# Patient Record
Sex: Male | Born: 1950 | Race: White | Hispanic: No | Marital: Married | Smoking: Former smoker
Health system: Southern US, Community
[De-identification: ages and names within clinical notes are randomized; demographics above are authoritative.]

## PROBLEM LIST (undated history)

## (undated) DIAGNOSIS — N210 Calculus in bladder: Secondary | ICD-10-CM

## (undated) DIAGNOSIS — N200 Calculus of kidney: Secondary | ICD-10-CM

## (undated) DIAGNOSIS — Z8719 Personal history of other diseases of the digestive system: Secondary | ICD-10-CM

## (undated) DIAGNOSIS — G40209 Localization-related (focal) (partial) symptomatic epilepsy and epileptic syndromes with complex partial seizures, not intractable, without status epilepticus: Secondary | ICD-10-CM

## (undated) DIAGNOSIS — Z973 Presence of spectacles and contact lenses: Secondary | ICD-10-CM

## (undated) DIAGNOSIS — R339 Retention of urine, unspecified: Secondary | ICD-10-CM

## (undated) DIAGNOSIS — Z87898 Personal history of other specified conditions: Secondary | ICD-10-CM

## (undated) DIAGNOSIS — R569 Unspecified convulsions: Secondary | ICD-10-CM

## (undated) DIAGNOSIS — N281 Cyst of kidney, acquired: Secondary | ICD-10-CM

## (undated) DIAGNOSIS — N401 Enlarged prostate with lower urinary tract symptoms: Secondary | ICD-10-CM

## (undated) DIAGNOSIS — R1084 Generalized abdominal pain: Secondary | ICD-10-CM

## (undated) DIAGNOSIS — G4700 Insomnia, unspecified: Secondary | ICD-10-CM

## (undated) DIAGNOSIS — K219 Gastro-esophageal reflux disease without esophagitis: Secondary | ICD-10-CM

## (undated) DIAGNOSIS — N138 Other obstructive and reflux uropathy: Secondary | ICD-10-CM

## (undated) DIAGNOSIS — Z85828 Personal history of other malignant neoplasm of skin: Secondary | ICD-10-CM

## (undated) DIAGNOSIS — C61 Malignant neoplasm of prostate: Secondary | ICD-10-CM

## (undated) DIAGNOSIS — M1990 Unspecified osteoarthritis, unspecified site: Secondary | ICD-10-CM

## (undated) DIAGNOSIS — E785 Hyperlipidemia, unspecified: Secondary | ICD-10-CM

## (undated) DIAGNOSIS — K449 Diaphragmatic hernia without obstruction or gangrene: Secondary | ICD-10-CM

## (undated) DIAGNOSIS — R9431 Abnormal electrocardiogram [ECG] [EKG]: Secondary | ICD-10-CM

## (undated) DIAGNOSIS — Z8669 Personal history of other diseases of the nervous system and sense organs: Secondary | ICD-10-CM

## (undated) DIAGNOSIS — G250 Essential tremor: Secondary | ICD-10-CM

## (undated) DIAGNOSIS — Z9889 Other specified postprocedural states: Secondary | ICD-10-CM

## (undated) DIAGNOSIS — G43909 Migraine, unspecified, not intractable, without status migrainosus: Secondary | ICD-10-CM

## (undated) DIAGNOSIS — R0789 Other chest pain: Secondary | ICD-10-CM

## (undated) DIAGNOSIS — K5190 Ulcerative colitis, unspecified, without complications: Secondary | ICD-10-CM

## (undated) DIAGNOSIS — Z87448 Personal history of other diseases of urinary system: Secondary | ICD-10-CM

## (undated) DIAGNOSIS — K5900 Constipation, unspecified: Secondary | ICD-10-CM

## (undated) DIAGNOSIS — I10 Essential (primary) hypertension: Secondary | ICD-10-CM

## (undated) DIAGNOSIS — D329 Benign neoplasm of meninges, unspecified: Secondary | ICD-10-CM

## (undated) DIAGNOSIS — G43109 Migraine with aura, not intractable, without status migrainosus: Secondary | ICD-10-CM

## (undated) DIAGNOSIS — Z87442 Personal history of urinary calculi: Secondary | ICD-10-CM

## (undated) DIAGNOSIS — I209 Angina pectoris, unspecified: Secondary | ICD-10-CM

## (undated) HISTORY — PX: CARPAL TUNNEL RELEASE: SHX101

## (undated) HISTORY — PX: DIAGNOSTIC LAPAROSCOPY: SUR761

## (undated) HISTORY — PX: TOE SURGERY: SHX1073

## (undated) HISTORY — PX: INSERTION PROSTATE RADIATION SEED: SUR718

## (undated) HISTORY — PX: BILATERAL CARPAL TUNNEL RELEASE: SHX6508

## (undated) HISTORY — PX: CRANIOTOMY: SHX93

## (undated) HISTORY — PX: PROSTATE BIOPSY: SHX241

## (undated) HISTORY — PX: KNEE CARTILAGE SURGERY: SHX688

---
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
## 2008-10-08 DIAGNOSIS — C61 Malignant neoplasm of prostate: Secondary | ICD-10-CM

## 2008-10-08 DIAGNOSIS — D329 Benign neoplasm of meninges, unspecified: Secondary | ICD-10-CM

## 2008-10-08 DIAGNOSIS — Z8546 Personal history of malignant neoplasm of prostate: Secondary | ICD-10-CM

## 2008-10-08 HISTORY — DX: Personal history of malignant neoplasm of prostate: Z85.46

## 2008-10-08 HISTORY — DX: Benign neoplasm of meninges, unspecified: D32.9

## 2008-10-08 HISTORY — PX: CRANIOTOMY: SHX93

## 2008-10-08 HISTORY — PX: INSERTION PROSTATE RADIATION SEED: SUR718

## 2008-10-08 HISTORY — DX: Malignant neoplasm of prostate: C61

## 2008-11-16 ENCOUNTER — Ambulatory Visit: Admission: RE | Admit: 2008-11-16 | Discharge: 2009-01-31 | Payer: Self-pay | Admitting: Radiation Oncology

## 2009-02-01 ENCOUNTER — Ambulatory Visit: Admission: RE | Admit: 2009-02-01 | Discharge: 2009-05-02 | Payer: Self-pay | Admitting: Radiation Oncology

## 2009-02-11 DIAGNOSIS — Z8603 Personal history of neoplasm of uncertain behavior: Secondary | ICD-10-CM

## 2009-02-11 HISTORY — PX: BRAIN MENINGIOMA EXCISION: SHX576

## 2009-02-11 HISTORY — DX: Personal history of neoplasm of uncertain behavior: Z86.03

## 2009-02-17 ENCOUNTER — Encounter: Admission: RE | Admit: 2009-02-17 | Discharge: 2009-02-17 | Payer: Self-pay | Admitting: Urology

## 2009-03-04 ENCOUNTER — Ambulatory Visit (HOSPITAL_BASED_OUTPATIENT_CLINIC_OR_DEPARTMENT_OTHER): Admission: RE | Admit: 2009-03-04 | Discharge: 2009-03-04 | Payer: Self-pay | Admitting: Urology

## 2009-03-04 HISTORY — PX: INSERTION PROSTATE RADIATION SEED: SUR718

## 2009-04-29 ENCOUNTER — Emergency Department (HOSPITAL_BASED_OUTPATIENT_CLINIC_OR_DEPARTMENT_OTHER): Admission: EM | Admit: 2009-04-29 | Discharge: 2009-04-30 | Payer: Self-pay | Admitting: Emergency Medicine

## 2009-04-30 ENCOUNTER — Ambulatory Visit: Payer: Self-pay | Admitting: Diagnostic Radiology

## 2009-07-08 ENCOUNTER — Ambulatory Visit (HOSPITAL_BASED_OUTPATIENT_CLINIC_OR_DEPARTMENT_OTHER): Admission: RE | Admit: 2009-07-08 | Discharge: 2009-07-08 | Payer: Self-pay | Admitting: Urology

## 2009-07-08 HISTORY — PX: CYSTOSCOPY WITH URETHRAL DILATATION: SHX5125

## 2011-01-11 LAB — POCT HEMOGLOBIN-HEMACUE: Hemoglobin: 13.3 g/dL (ref 13.0–17.0)

## 2011-01-14 LAB — COMPREHENSIVE METABOLIC PANEL
ALT: 20 U/L (ref 0–53)
Alkaline Phosphatase: 63 U/L (ref 39–117)
Calcium: 9 mg/dL (ref 8.4–10.5)
Creatinine, Ser: 2 mg/dL — ABNORMAL HIGH (ref 0.4–1.5)
GFR calc Af Amer: 42 mL/min — ABNORMAL LOW (ref >60)
Glucose, Bld: 107 mg/dL — ABNORMAL HIGH (ref 70–99)
Sodium: 140 mEq/L (ref 135–145)
Total Bilirubin: 0.5 mg/dL (ref 0.3–1.2)
Total Protein: 6.7 g/dL (ref 6.0–8.3)

## 2011-01-14 LAB — CBC
HCT: 34.6 % — ABNORMAL LOW (ref 39.0–52.0)
Platelets: 188 10*3/uL (ref 150–400)

## 2011-01-14 LAB — URINE MICROSCOPIC-ADD ON

## 2011-01-14 LAB — DIFFERENTIAL
Basophils Absolute: 0.2 10*3/uL — ABNORMAL HIGH (ref 0.0–0.1)
Basophils Relative: 1 % (ref 0–1)
Eosinophils Relative: 1 % (ref 0–5)
Lymphocytes Relative: 6 % — ABNORMAL LOW (ref 12–46)

## 2011-01-14 LAB — URINALYSIS, ROUTINE W REFLEX MICROSCOPIC
Glucose, UA: NEGATIVE mg/dL
Nitrite: POSITIVE — AB
Specific Gravity, Urine: 1.018 (ref 1.005–1.030)

## 2011-01-14 LAB — URINE CULTURE

## 2011-01-16 LAB — COMPREHENSIVE METABOLIC PANEL
ALT: 18 U/L (ref 0–53)
AST: 20 U/L (ref 0–37)
Albumin: 4.3 g/dL (ref 3.5–5.2)
Alkaline Phosphatase: 51 U/L (ref 39–117)
BUN: 22 mg/dL (ref 6–23)
CO2: 28 mEq/L (ref 19–32)
Calcium: 9.5 mg/dL (ref 8.4–10.5)
Chloride: 108 mEq/L (ref 96–112)
Creatinine, Ser: 1.08 mg/dL (ref 0.4–1.5)
GFR calc Af Amer: >60 mL/min (ref >60)
GFR calc non Af Amer: >60 mL/min (ref >60)
Glucose, Bld: 122 mg/dL — ABNORMAL HIGH (ref 70–99)
Potassium: 4 mEq/L (ref 3.5–5.1)
Sodium: 143 mEq/L (ref 135–145)
Total Bilirubin: 0.9 mg/dL (ref 0.3–1.2)
Total Protein: 6.9 g/dL (ref 6.0–8.3)

## 2011-01-16 LAB — CBC
HCT: 43 % (ref 39.0–52.0)
Hemoglobin: 14.4 g/dL (ref 13.0–17.0)
MCHC: 33.4 g/dL (ref 30.0–36.0)
MCV: 93.2 fL (ref 78.0–100.0)
Platelets: 187 10*3/uL (ref 150–400)
RBC: 4.62 MIL/uL (ref 4.22–5.81)
RDW: 13.7 % (ref 11.5–15.5)
WBC: 6.5 10*3/uL (ref 4.0–10.5)

## 2011-01-16 LAB — PROTIME-INR
INR: 1 (ref 0.00–1.49)
Prothrombin Time: 13.6 seconds (ref 11.6–15.2)

## 2011-01-16 LAB — APTT: aPTT: 28 seconds (ref 24–37)

## 2011-02-20 NOTE — Op Note (Signed)
NAME:  Wesley [name], Wesley [name] NO.:  [id number]   MEDICAL RECORD NO.:  [id number]          PATIENT TYPE:  REC   LOCATION:  [facility]                         FACILITY:  [facility]   PHYSICIAN:  Mark C. Vernie Ammons, M.D.  DATE OF BIRTH:  1950/11/03   DATE OF PROCEDURE:  03/04/2009  DATE OF DISCHARGE:  02/11/2009                               OPERATIVE REPORT   PREOPERATIVE DIAGNOSIS:  Adenocarcinoma of the prostate.   POSTOPERATIVE DIAGNOSIS:  Adenocarcinoma of the prostate.   PROCEDURE:  I-125 radioactive seed implantation.   SURGEON:  Dr. Vernie Ammons.   RADIATION ONCOLOGIST:  Dr. Kathrynn Running.   ANESTHESIA:  General.   SPECIMENS:  None.   DRAINS:  A 16-French Foley catheter.   BLOOD LOSS:  Minimal.   COMPLICATIONS:  None.   INDICATIONS:  The patient is a 60 year old male who was initially seen  with a PSA of 5.2.  This prompted prostate biopsy which revealed Gleason  4+3 adenocarcinoma in 5 of 12 cores.  He presents today for radioactive  seed implantation.  I have discussed the risks, complications,  alternatives and limitations with him.  He understands and has elected  to proceed.   DESCRIPTION OF OPERATION:  After informed consent, the patient was  brought to the major OR, placed on table and administered general  anesthesia.  He was then moved to the modified lithotomy position and  his perineum and genitalia were sterilely prepped and draped.  A 16-  French Foley catheter was placed in the bladder with dilute contrast  filling the balloon and the rectal probe, as well a rectal tube were  placed.  The probe was affixed to the stand and real-time planning  ultrasound was then performed by Dr. Kathrynn Running.   Using the real-time plan developed intraoperatively, a total of 24  needles were used to place a total of 82 seeds within the prostate  according to the plan using the Nucletron trauma and direct real-time  ultrasonography.  This proceeded without difficulty.   The Foley  catheter was then removed and flexible cystoscopy was  performed.  The urethra was noted be normal and the sphincter intact.  The prostatic urethra revealed bilobar hypertrophy, but there were no  intraprostatic lesions or seeds identified.  Upon entering the bladder,  I noted 1+ trabeculation.  The bladder was systematically inspected and  noted to be free of any tumor, stones or inflammatory lesions.  The  ureteral orifices were of normal configuration and position.  Retroflexion of the scope revealed no seeds on the floor of the bladder  or protruding from the prostate into the base of the bladder.  I  therefore removed the cystoscope, placed a new Foley catheter and  connected this to close system drainage and the patient was awakened and  taken to recovery room in stable and satisfactory condition.  He  tolerated the procedure well and there no intraoperative complications.   He will be given prescriptions for Vicodin HP #28 and Cipro 500 mg #10.  He will follow-up in my office and with Dr. Kathrynn Running in 3 weeks and  remove his Foley  catheter at home in the morning.      Mark C. Vernie Ammons, M.D.  Electronically Signed     MCO/MEDQ  D:  03/04/2009  T:  03/04/2009  Job:  [id number]   cc:   Artist Pais Kathrynn Running, M.D.  Fax: [phone]

## 2012-03-01 ENCOUNTER — Emergency Department (HOSPITAL_BASED_OUTPATIENT_CLINIC_OR_DEPARTMENT_OTHER)
Admission: EM | Admit: 2012-03-01 | Discharge: 2012-03-01 | Disposition: A | Discharge status: Home / Self Care | Payer: PRIVATE HEALTH INSURANCE | Source: Home / Self Care | Attending: Emergency Medicine | Admitting: Emergency Medicine

## 2012-03-01 ENCOUNTER — Encounter (HOSPITAL_COMMUNITY): Payer: Self-pay | Admitting: *Deleted

## 2012-03-01 ENCOUNTER — Inpatient Hospital Stay (HOSPITAL_COMMUNITY)
Admission: EM | Admit: 2012-03-01 | Discharge: 2012-03-06 | DRG: 663 | Disposition: A | Discharge status: Home / Self Care | Payer: PRIVATE HEALTH INSURANCE | Attending: Urology | Admitting: Urology

## 2012-03-01 ENCOUNTER — Encounter (HOSPITAL_BASED_OUTPATIENT_CLINIC_OR_DEPARTMENT_OTHER): Payer: Self-pay | Admitting: *Deleted

## 2012-03-01 DIAGNOSIS — R319 Hematuria, unspecified: Secondary | ICD-10-CM | POA: Diagnosis present

## 2012-03-01 DIAGNOSIS — Z87442 Personal history of urinary calculi: Secondary | ICD-10-CM

## 2012-03-01 DIAGNOSIS — Y846 Urinary catheterization as the cause of abnormal reaction of the patient, or of later complication, without mention of misadventure at the time of the procedure: Secondary | ICD-10-CM | POA: Insufficient documentation

## 2012-03-01 DIAGNOSIS — N3040 Irradiation cystitis without hematuria: Principal | ICD-10-CM | POA: Diagnosis present

## 2012-03-01 DIAGNOSIS — Y842 Radiological procedure and radiotherapy as the cause of abnormal reaction of the patient, or of later complication, without mention of misadventure at the time of the procedure: Secondary | ICD-10-CM | POA: Diagnosis present

## 2012-03-01 DIAGNOSIS — R310 Gross hematuria: Secondary | ICD-10-CM | POA: Diagnosis present

## 2012-03-01 DIAGNOSIS — N3289 Other specified disorders of bladder: Secondary | ICD-10-CM | POA: Diagnosis present

## 2012-03-01 DIAGNOSIS — Z923 Personal history of irradiation: Secondary | ICD-10-CM

## 2012-03-01 DIAGNOSIS — Z9889 Other specified postprocedural states: Secondary | ICD-10-CM | POA: Insufficient documentation

## 2012-03-01 DIAGNOSIS — R300 Dysuria: Secondary | ICD-10-CM | POA: Insufficient documentation

## 2012-03-01 DIAGNOSIS — Z8546 Personal history of malignant neoplasm of prostate: Secondary | ICD-10-CM

## 2012-03-01 DIAGNOSIS — Z79899 Other long term (current) drug therapy: Secondary | ICD-10-CM | POA: Insufficient documentation

## 2012-03-01 DIAGNOSIS — T839XXA Unspecified complication of genitourinary prosthetic device, implant and graft, initial encounter: Secondary | ICD-10-CM

## 2012-03-01 DIAGNOSIS — R339 Retention of urine, unspecified: Secondary | ICD-10-CM

## 2012-03-01 DIAGNOSIS — R338 Other retention of urine: Secondary | ICD-10-CM | POA: Diagnosis present

## 2012-03-01 DIAGNOSIS — T66XXXS Radiation sickness, unspecified, sequela: Secondary | ICD-10-CM

## 2012-03-01 DIAGNOSIS — T83091A Other mechanical complication of indwelling urethral catheter, initial encounter: Secondary | ICD-10-CM | POA: Insufficient documentation

## 2012-03-01 HISTORY — DX: Benign neoplasm of meninges, unspecified: D32.9

## 2012-03-01 HISTORY — DX: Calculus of kidney: N20.0

## 2012-03-01 HISTORY — DX: Other specified postprocedural states: Z98.890

## 2012-03-01 HISTORY — DX: Malignant neoplasm of prostate: C61

## 2012-03-01 LAB — URINE MICROSCOPIC-ADD ON

## 2012-03-01 LAB — URINALYSIS, ROUTINE W REFLEX MICROSCOPIC
Glucose, UA: NEGATIVE mg/dL
Specific Gravity, Urine: 1.024 (ref 1.005–1.030)
pH: 6.5 (ref 5.0–8.0)

## 2012-03-01 LAB — HEMOGLOBIN AND HEMATOCRIT, BLOOD: Hemoglobin: 11.7 g/dL — ABNORMAL LOW (ref 13.0–17.0)

## 2012-03-01 MED ORDER — LIDOCAINE HCL 2 % EX GEL
CUTANEOUS | Status: AC
  Administered 2012-03-01: 16:00:00
  Filled 2012-03-01: qty 10

## 2012-03-01 MED ORDER — DEXTROSE 5 % IV SOLN
1.0000 g | Freq: Once | INTRAVENOUS | Status: AC
  Administered 2012-03-01: 23:00:00 via INTRAVENOUS
  Filled 2012-03-01: qty 10

## 2012-03-01 MED ORDER — DEXTROSE-NACL 5-0.45 % IV SOLN
INTRAVENOUS | Status: DC
  Administered 2012-03-01: 20 mL/h via INTRAVENOUS

## 2012-03-01 MED ORDER — HYDROMORPHONE HCL PF 1 MG/ML IJ SOLN: 1.0000 mg | INTRAMUSCULAR | Status: DC | PRN

## 2012-03-01 NOTE — H&P (Signed)
Wesley [name] is an 61 y.o. male.   Chief Complaint: clot retention HPI: Mr. Balaban is a patient of Dr. Vernie Ammons with a history of prostate cancer treated with a seed implant in 2010.  Last week he began to have gross hematuria and was seen at the [facility] in [place].   He has a history of stones and CT was done and he reports it as unremarkable.   He had a foley placed but developed clot obstruction of the catheter.  He came to the office earlier this week and was irrigated and sent out with a 90fr 3 way foley.  Today the foley stopped draining and he came to the ER at my request.  He has abdominal pain from the retention.   He had a recent negative culture.   In the ER I irrigated his catheter with 1.5 liters of saline and removed a large amount of clot but was eventually able to get him clear.   He was placed on continuous irrigation with clear drainage.  Past Medical History  Diagnosis Date  . Prostate ca   . Renal calculi   . H/O craniotomy   . Meningioma     Past Surgical History  Procedure Date  . Craniotomy   . Insertion prostate radiation seed   . Knee cartilage surgery     History reviewed. No pertinent family history. Social History:  reports that he quit smoking about 25 years ago. His smoking use included Cigarettes. He smoked 1 pack per day. He has never used smokeless tobacco. He reports that he drinks about 1.2 ounces of alcohol per week. He reports that he does not use illicit drugs.  Allergies:  Allergies  Allergen Reactions  . Sulfa Antibiotics Rash    I have reviewed his medications and he is on tamsulosin, avodart, finasteride, crestor and asacol.  Results for orders placed during the hospital encounter of 03/01/12 (from the past 48 hour(s))  URINALYSIS, ROUTINE W REFLEX MICROSCOPIC     Status: Abnormal   Collection Time   03/01/12  4:29 PM      Component Value Range Comment   Color, Urine RED (*) YELLOW  BIOCHEMICALS MAY BE AFFECTED BY COLOR     APPearance TURBID (*) CLEAR     Specific Gravity, Urine 1.024  1.005 - 1.030     pH 6.5  5.0 - 8.0     Glucose, UA NEGATIVE  NEGATIVE (mg/dL)    Hgb urine dipstick LARGE (*) NEGATIVE     Bilirubin Urine MODERATE (*) NEGATIVE     Ketones, ur 15 (*) NEGATIVE (mg/dL)    Protein, ur >161 (*) NEGATIVE (mg/dL)    Urobilinogen, UA 1.0  0.0 - 1.0 (mg/dL)    Nitrite POSITIVE (*) NEGATIVE     Leukocytes, UA MODERATE (*) NEGATIVE    URINE MICROSCOPIC-ADD ON     Status: Abnormal   Collection Time   03/01/12  4:29 PM      Component Value Range Comment   WBC, UA 3-6  <3 (WBC/hpf)    RBC / HPF TOO NUMEROUS TO COUNT  <3 (RBC/hpf)    Bacteria, UA FEW (*) RARE     Urine-Other FIELD OBSCURED BY RBC'S   URINALYSIS PERFORMED ON SUPERNATANT  HEMOGLOBIN AND HEMATOCRIT, BLOOD     Status: Abnormal   Collection Time   03/01/12  6:09 PM      Component Value Range Comment   Hemoglobin 11.7 (*) 13.0 - 17.0 (g/dL)  HCT 34.4 (*) 39.0 - 52.0 (%)    No results found.  Review of Systems  Constitutional: Negative for fever and chills.  Respiratory: Negative for shortness of breath.   Cardiovascular: Negative for chest pain.  Gastrointestinal: Positive for abdominal pain (suprapubic with the obstructed foley.).  Genitourinary: Positive for hematuria.  All other systems reviewed and are negative.    Blood pressure 156/75, pulse 84, temperature 99 F (37.2 C), temperature source Oral, resp. rate 16, height 5\' 9"  (1.753 m), weight 76.658 kg (169 lb), SpO2 98.00%. Physical Exam  Constitutional: He is oriented to person, place, and time. He appears well-developed and well-nourished.  HENT:  Head: Normocephalic and atraumatic.  Neck: Normal range of motion. Neck supple.  Cardiovascular: Normal rate and regular rhythm.   Respiratory: Effort normal and breath sounds normal.  GI: Soft. Bowel sounds are normal. There is tenderness (suprapubic with fullness.).  Genitourinary: Penis normal.       3- way foley  indwelling that will not drain with irrigation.  Musculoskeletal: Normal range of motion. He exhibits no edema.  Neurological: He is alert and oriented to person, place, and time.  Skin: Skin is warm. He is diaphoretic (with the obstructed foley.).  Psychiatric: He has a normal mood and affect. His behavior is normal.     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  I am going to keep him NPO overnight in case the bleeding recurs and cystoscopy is required. If the urine remains clear, the foley can be removed in the morning. He will need f/u with Dr. Vernie Ammons for cystoscopy as an outpatient if he doesn't require cystoscopy here. He received a gram of Rocephin in the ER.  Kahealani Yankovich J 03/01/2012, 10:00 PM

## 2012-03-01 NOTE — ED Provider Notes (Signed)
History     CSN: [id number]  Arrival date & time 03/01/12  1432   First MD Initiated Contact with Patient 03/01/12 1535      Chief Complaint  Patient presents with  . Hematuria  . foley catheter obstruction     (Consider location/radiation/quality/duration/timing/severity/associated sxs/prior treatment) Patient is a 61 y.o. male presenting with hematuria. The history is provided by the patient.  Hematuria This is a recurrent problem. The current episode started today. Pertinent negatives include no fever or nausea. (Foley catheter in place for urinary retention x 2 weeks with gross hematuria. He has followed up with his urologist and foley changed in office. Today he has a recurrence of failure of urine to drain into bag and abdominal distention c/w retention. ) (He has a history of prostate CA with radioactive seeding that has bled from time to time. The current episode is the longest period of bleeding at 2 weeks.)    Past Medical History  Diagnosis Date  . Prostate ca   . Renal calculi   . H/O craniotomy   . Meningioma     Past Surgical History  Procedure Date  . Craniotomy   . Insertion prostate radiation seed   . Knee cartilage surgery     History reviewed. No pertinent family history.  History  Substance Use Topics  . Smoking status: Former Smoker -- 1.0 packs/day    Types: Cigarettes    Quit date: 03/02/1987  . Smokeless tobacco: Never Used  . Alcohol Use: 1.2 oz/week    2 Glasses of wine per week     once a week      Review of Systems  Constitutional: Negative for fever.  Gastrointestinal: Positive for abdominal distention. Negative for nausea.  Genitourinary: Positive for hematuria.       See HPI.    Allergies  Sulfa antibiotics  Home Medications   Current Outpatient Rx  Name Route Sig Dispense Refill  . ATORVASTATIN CALCIUM 20 MG PO TABS Oral Take 20 mg by mouth daily.    . DUTASTERIDE 0.5 MG PO CAPS Oral Take 0.5 mg by mouth daily.    Marland Kitchen  FINASTERIDE 5 MG PO TABS Oral Take 5 mg by mouth daily.    Marland Kitchen MESALAMINE 400 MG PO TBEC Oral Take 2,400 mg by mouth 2 (two) times daily.     Marland Kitchen TAMSULOSIN HCL 0.4 MG PO CAPS Oral Take 0.4 mg by mouth daily.      BP 150/80  Pulse 82  Temp(Src) 98.8 F (37.1 C) (Oral)  Resp 18  Ht 5\' 9"  (1.753 m)  Wt 169 lb (76.658 kg)  BMI 24.96 kg/m2  SpO2 99%  Physical Exam  Constitutional: He appears well-developed and well-nourished.       Uncomfortable in appearance.  Pulmonary/Chest: Effort normal.  Abdominal: Soft. He exhibits distension. There is tenderness.  Genitourinary:       Foley catheter in place. Meatus unremarkable. Blood and clots in urinary bag without active drainage. No testicular tenderness or swelling.     ED Course  Procedures (including critical care time)  Labs Reviewed  URINALYSIS, ROUTINE W REFLEX MICROSCOPIC - Abnormal; Notable for the following:    Color, Urine RED (*) BIOCHEMICALS MAY BE AFFECTED BY COLOR   APPearance TURBID (*)    Hgb urine dipstick LARGE (*)    Bilirubin Urine MODERATE (*)    Ketones, ur 15 (*)    Protein, ur >300 (*)    Nitrite POSITIVE (*)  Leukocytes, UA MODERATE (*)    All other components within normal limits  URINE MICROSCOPIC-ADD ON - Abnormal; Notable for the following:    Bacteria, UA FEW (*)    All other components within normal limits  HEMOGLOBIN AND HEMATOCRIT, BLOOD   No results found.   No diagnosis found.  1. Hematuria 2. Urinary retention   MDM  Catheter has been irrigated and is draining grossly bloody urine into urinary bag. Patient is more comfortable.   Discussed with Dr. Annabell Howells who gives orders for admission. Patient brought up to date and agrees with care plan.       Rodena Medin, PA-C 03/01/12 1933

## 2012-03-01 NOTE — ED Notes (Signed)
Pt has an indwelling catheter for radiation seed implantation for prostat cancer. States that he has had blood in his urine and has had 2 foley catheters since last Sunday. States the catheters become clogged with blood. Pt went to his urologist, dr. Vernie Ammons and was told to come to the ER and have catheter irrigated. Pt has lower abdominal pain 7/10 at this time. Foley catheter has old blood in tubing.

## 2012-03-01 NOTE — ED Notes (Signed)
Report received from Bridgette RN  Pt is from home he was seen at [facility]  Earlier this am for urinary retention and passing only blood clots,  Catheter was inserted and flushed, pt was sent home,  This afternoon pt began having same symptoms again and he came to [facility] long ER and three way foley placed,  Approximately 600 cc bloody urine returned,  Then pt began feeling full in bladder area again, Bridgette RN stopped infusing bladder flush and notified EDP and urology was called,  Pt now awaiting admission to floor.  NAD,   Pt is currently reading his book

## 2012-03-01 NOTE — ED Notes (Signed)
Pt states he was seen at [facility] this am for same, catheter was irrigated until it was flowing freely. Pt sent home with instructions to increase fluid intake.  Pt states he hasn't put out much urine this afternoon and is beginning to feel pressure in his lower abd so he feels catheter is obstructed again.

## 2012-03-01 NOTE — ED Notes (Signed)
Evangeline Gula to return call for report  [phone] [phone]

## 2012-03-01 NOTE — ED Notes (Addendum)
3-way catheter inserted by Vincenza Hews RN for irrigation.  Catheter now draining clear, red urine.  No clots noted at this time. Pt denies need for pain medication.

## 2012-03-01 NOTE — ED Provider Notes (Signed)
History     CSN: [id number]  Arrival date & time 03/01/12  8295   First MD Initiated Contact with Patient 03/01/12 [phone]      Chief Complaint  Patient presents with  . Hematuria    clogged foley catheter    (Consider location/radiation/quality/duration/timing/severity/associated sxs/prior treatment) HPI Comments: Patient comes in complaining of a clogged Foley catheter. He states he had radiation seeds implanted due to prostate cancer, and occasionally he gets bleeding from his prostate. He started bleeding about 2 weeks ago and went to [facility] last Sunday due to difficulty urinating. They placed a Foley catheter and got a large amount of bloody urine out. He was doing fine until Thursday when the catheter got clogged and he went to his urologist at [facility] urology where they replaced the catheter with a larger catheter. He now has a 20-gauge catheter in place. He's continuing to have some blood clots and feels that the catheter has become clotted again as it is not draining urine  properly. He also notes some fullness in his bladder area. He denies any abdominal pain. Denies any nausea, vomiting. Denies any fevers or chills. Denies taking any blood thinners.  Patient is a 61 y.o. male presenting with hematuria. The history is provided by the patient.  Hematuria This is a recurrent problem. The problem has been gradually worsening since onset. He describes the hematuria as gross hematuria. Irritative symptoms do not include frequency. Associated symptoms include dysuria. Pertinent negatives include no abdominal pain, chills, fever, flank pain, nausea or vomiting.    Past Medical History  Diagnosis Date  . Prostate ca   . Renal calculi   . H/O craniotomy   . Meningioma     Past Surgical History  Procedure Date  . Craniotomy   . Insertion prostate radiation seed   . Knee cartilage surgery     History reviewed. No pertinent family history.  History  Substance  Use Topics  . Smoking status: Former Smoker -- 1.0 packs/day    Types: Cigarettes    Quit date: 03/02/1987  . Smokeless tobacco: Never Used  . Alcohol Use: Not on file      Review of Systems  Constitutional: Negative for fever, chills, diaphoresis and fatigue.  HENT: Negative for congestion, rhinorrhea and sneezing.   Eyes: Negative.   Respiratory: Negative for cough, chest tightness and shortness of breath.   Cardiovascular: Negative for chest pain and leg swelling.  Gastrointestinal: Negative for nausea, vomiting, abdominal pain, diarrhea and blood in stool.  Genitourinary: Positive for dysuria and hematuria. Negative for frequency, flank pain and difficulty urinating.  Musculoskeletal: Negative for back pain and arthralgias.  Skin: Negative for rash.  Neurological: Negative for dizziness, speech difficulty, weakness, numbness and headaches.    Allergies  Sulfa antibiotics  Home Medications   Current Outpatient Rx  Name Route Sig Dispense Refill  . ATORVASTATIN CALCIUM 20 MG PO TABS Oral Take 20 mg by mouth daily.    . DUTASTERIDE 0.5 MG PO CAPS Oral Take 0.5 mg by mouth daily.    Marland Kitchen MESALAMINE 400 MG PO TBEC Oral Take 2,400 mg by mouth 3 (three) times daily.    Marland Kitchen TAMSULOSIN HCL 0.4 MG PO CAPS Oral Take 0.4 mg by mouth daily.      BP 166/105  Pulse 110  Temp(Src) 98.9 F (37.2 C) (Oral)  Resp 18  Ht 5\' 9"  (1.753 m)  Wt 170 lb (77.111 kg)  BMI 25.10 kg/m2  SpO2 98%  Physical Exam  Constitutional: He is oriented to person, place, and time. He appears well-developed and well-nourished.  HENT:  Head: Normocephalic and atraumatic.  Eyes: Pupils are equal, round, and reactive to light.  Neck: Normal range of motion. Neck supple.  Cardiovascular: Normal rate, regular rhythm and normal heart sounds.   Pulmonary/Chest: Effort normal and breath sounds normal. No respiratory distress. He has no wheezes. He has no rales. He exhibits no tenderness.  Abdominal: Soft. Bowel  sounds are normal. There is no tenderness. There is no rebound and no guarding.  Genitourinary:       He has a Foley catheter in place, with a leg bag with a small amount of bloody urine in the bag  Musculoskeletal: Normal range of motion. He exhibits no edema.  Lymphadenopathy:    He has no cervical adenopathy.  Neurological: He is alert and oriented to person, place, and time.  Skin: Skin is warm and dry. No rash noted.  Psychiatric: He has a normal mood and affect.    ED Course  Procedures (including critical care time)  Labs Reviewed - No data to display No results found.   1. Hematuria   2. Foley catheter problem       MDM  Foley was irrigated.  Pt feeling much better, has good UOP.  Encouraged increased fluid intake, close f/u with his urologist        Rolan Bucco, MD 03/01/12 [phone]

## 2012-03-01 NOTE — ED Notes (Signed)
Pt called out stating he felt like catheter was clogged again, catheter had been draining well with irrigation bag instilling up to this point.  Catheter irrigated with syringe and large clot removed.  Melvenia Beam, PA made aware and will consult urology. Pt updated and agrees with plan.

## 2012-03-01 NOTE — ED Notes (Signed)
Foley catheter irrigated with 300cc of sterile water. 250cc of bloody urine output.

## 2012-03-01 NOTE — Discharge Instructions (Signed)
Hematuria, Adult  Hematuria (blood in your urine) can be caused by a bladder infection (cystitis), kidney infection (pyelonephritis), prostate infection (prostatitis), or kidney stone. Infections will usually respond to antibiotics (medications which kill germs), and a kidney stone will usually pass through your urine without further treatment. If you were put on antibiotics, take all the medicine until gone. You may feel better in a few days, but take all of your medicine or the infection may not respond and become more difficult to treat. If antibiotics were not given, an infection did not cause the blood in the urine. A further work up to find out the reason may be needed.  HOME CARE INSTRUCTIONS   · Drink lots of fluid, 3 to 4 quarts a day. If you have been diagnosed with an infection, cranberry juice is especially recommended, in addition to large amounts of water.  · Avoid caffeine, tea, and carbonated beverages, because they tend to irritate the bladder.  · Avoid alcohol as it may irritate the prostate.  · Only take over-the-counter or prescription medicines for pain, discomfort, or fever as directed by your caregiver.  · If you have been diagnosed with a kidney stone follow your caregivers instructions regarding straining your urine to catch the stone.  TO PREVENT FURTHER INFECTIONS:  · Empty the bladder often. Avoid holding urine for long periods of time.  · After a bowel movement, women should cleanse front to back. Use each tissue only once.  · Empty the bladder before and after sexual intercourse if you are a male.  · Return to your caregiver if you develop back pain, fever, nausea (feeling sick to your stomach), vomiting, or your symptoms (problems) are not better in 3 days. Return sooner if you are getting worse.  If you have been requested to return for further testing make sure to keep your appointments. If an infection is not the cause of blood in your urine, X-rays may be required. Your caregiver  will discuss this with you.  SEEK IMMEDIATE MEDICAL CARE IF:   · You have a persistent fever over 102° F (38.9° C).  · You develop severe vomiting and are unable to keep the medication down.  · You develop severe back or abdominal pain despite taking your medications.  · You begin passing a large amount of blood or clots in your urine.  · You feel extremely weak or faint, or pass out.  MAKE SURE YOU:   · Understand these instructions.  · Will watch your condition.  · Will get help right away if you are not doing well or get worse.  Document Released: 09/24/2005 Document Revised: 09/13/2011 Document Reviewed: 05/13/2008  [employer]® Patient Information ©2012 [employer].

## 2012-03-01 NOTE — ED Notes (Signed)
Pt from home with reports of hematuria and foley catheter obstruction that started this afternoon. Pt reports hx of same that started 5/18 and is being f/b Urologist.

## 2012-03-02 MED ORDER — SIMVASTATIN 20 MG PO TABS
20.0000 mg | ORAL_TABLET | Freq: Every day | ORAL | Status: DC
  Administered 2012-03-02 – 2012-03-05 (×4): 20 mg via ORAL
  Filled 2012-03-02 (×5): qty 1

## 2012-03-02 MED ORDER — TAMSULOSIN HCL 0.4 MG PO CAPS
0.4000 mg | ORAL_CAPSULE | Freq: Every day | ORAL | Status: DC
  Administered 2012-03-02 – 2012-03-05 (×4): 0.4 mg via ORAL
  Filled 2012-03-02 (×5): qty 1

## 2012-03-02 MED ORDER — ONDANSETRON HCL 4 MG/2ML IJ SOLN: 4.0000 mg | INTRAMUSCULAR | Status: DC | PRN

## 2012-03-02 MED ORDER — HYDROMORPHONE HCL PF 1 MG/ML IJ SOLN
0.5000 mg | INTRAMUSCULAR | Status: DC | PRN
  Administered 2012-03-02 – 2012-03-05 (×13): 1 mg via INTRAVENOUS
  Filled 2012-03-02 (×11): qty 1

## 2012-03-02 MED ORDER — KCL IN DEXTROSE-NACL 20-5-0.45 MEQ/L-%-% IV SOLN
INTRAVENOUS | Status: DC
  Administered 2012-03-02: 100 mL/h via INTRAVENOUS
  Administered 2012-03-02 – 2012-03-05 (×7): via INTRAVENOUS
  Filled 2012-03-02 (×12): qty 1000

## 2012-03-02 MED ORDER — BISACODYL 10 MG RE SUPP: 10.0000 mg | Freq: Every day | RECTAL | Status: DC | PRN

## 2012-03-02 MED ORDER — ZOLPIDEM TARTRATE 5 MG PO TABS
5.0000 mg | ORAL_TABLET | Freq: Every evening | ORAL | Status: DC | PRN
  Administered 2012-03-03: 5 mg via ORAL
  Filled 2012-03-02: qty 1

## 2012-03-02 MED ORDER — ACETAMINOPHEN 325 MG PO TABS: 650.0000 mg | ORAL_TABLET | ORAL | Status: DC | PRN

## 2012-03-02 MED ORDER — MESALAMINE 400 MG PO TBEC
2400.0000 mg | DELAYED_RELEASE_TABLET | Freq: Two times a day (BID) | ORAL | Status: DC
  Administered 2012-03-02 – 2012-03-05 (×8): 2400 mg via ORAL
  Filled 2012-03-02 (×10): qty 6

## 2012-03-02 MED ORDER — HYDROCODONE-ACETAMINOPHEN 5-325 MG PO TABS
1.0000 | ORAL_TABLET | ORAL | Status: DC | PRN
  Administered 2012-03-02 – 2012-03-03 (×6): 2 via ORAL
  Filled 2012-03-02 (×6): qty 2

## 2012-03-02 MED ORDER — HYOSCYAMINE SULFATE 0.125 MG SL SUBL
0.1250 mg | SUBLINGUAL_TABLET | SUBLINGUAL | Status: DC | PRN
  Administered 2012-03-05: 0.125 mg via ORAL
  Filled 2012-03-02: qty 1

## 2012-03-02 MED ORDER — FINASTERIDE 5 MG PO TABS
5.0000 mg | ORAL_TABLET | Freq: Every day | ORAL | Status: DC
  Administered 2012-03-02 – 2012-03-05 (×4): 5 mg via ORAL
  Filled 2012-03-02 (×5): qty 1

## 2012-03-02 NOTE — Progress Notes (Signed)
Patient ID: Wesley [name], male   DOB: Dec 10, 1950, 61 y.o.   MRN: [id number]   Subjective: Patient reports having a relatively uneventful night. His catheter continued to drain overnight and he required no additional clot evacuations. Other than having some mild penile discomfort he is without new complaints or concerns today. I reviewed his previous notes. 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 His urine at this point is light to medium pain without clots in his catheter is draining relatively well with CBI.  Objective: Vital signs in last 24 hours: Temp:  [98.8 F (37.1 C)-99.3 F (37.4 C)] 98.8 F (37.1 C) (05/26 0542) Pulse Rate:  [80-110] 80  (05/26 0542) Resp:  [16-20] 17  (05/26 0542) BP: (129-171)/(70-88) 129/76 mmHg (05/26 0542) SpO2:  [95 %-99 %] 95 % (05/26 0542) Weight:  [75.978 kg (167 lb 8 oz)-76.658 kg (169 lb)] 75.978 kg (167 lb 8 oz) (05/26 0029)  Intake/Output from previous day: 05/25 0701 - 05/26 0700 In: 5220 [I.V.:320] Out: 5400 [Urine:5400] Intake/Output this shift:    Physical Exam:  Constitutional: Vital signs reviewed. WD WN in NAD   Eyes: PERRL, No scleral icterus.   Cardiovascular: RRR Pulmonary/Chest: Normal effort Abdominal: Soft. Non-tender, non-distended..  Genitourinary: Catheter indwelling draining medium pink urine. Extremities: No cyanosis or edema   Lab Results:  Basename 03/01/12 1809  HGB 11.7*  HCT 34.4*   BMET No results found for this basename: NA:2,K:2,CL:2,CO2:2,GLUCOSE:2,BUN:2,CREATININE:2,CALCIUM:2 in the last 72 hours No results found for this basename: LABPT:3,INR:3 in the last 72 hours No results found for this basename: LABURIN:1 in the last 72 hours Results for orders placed during the hospital encounter of 04/29/09  URINE CULTURE     Status: Normal   Collection Time   04/29/09 10:32 PM      Component Value Range Status  Comment   Specimen Description URINE, RANDOM   Final    Special Requests NONE   Final    Colony Count NO GROWTH   Final    Culture NO GROWTH   Final    Report Status 05/01/2009 FINAL   Final     Studies/Results: No results found.  Assessment/Plan:   Doing reasonably well clinically. Patient continues to have gross hematuria but his catheter is draining well. I see no indication for any operative procedure today. If the hematuria persists he certainly may require cystoscopy under anesthesia to rule out other pathology. No indication for emergent cystoscopy today. We'll discontinue n.p.o. status. We'll make sure that he does have a hemoglobin and hematocrit checked in the next 24-48 hours.   LOS: 1 day   Lanell Carpenter,Levoy S 03/02/2012, 8:30 AM

## 2012-03-02 NOTE — Progress Notes (Signed)
Continued with CBI, no clots noted BUT at 1450 noted decrease output flow and patient C/O discomfort, manual irrigation done and some clots noted during the manual irrigation, and patient felt better after the irrigation, output still pinkish and intermittent bloody output. Will continue to assess patient.

## 2012-03-02 NOTE — ED Provider Notes (Signed)
Medical screening examination/treatment/procedure(s) were conducted as a shared visit with non-physician practitioner(s) and myself.  I personally evaluated the patient during the encounter On my exam the patient was uncomfortable, though in no distress.  The patient's history is notable for ongoing radiotherapy for prostate cancer, and a newly recurrent phenomena of occluded urinary catheter.  During my evaluation the patient, we attempted to irrigate his catheter several times, and he required continuous irrigation.  Even this measure did not successfully provide continuous urinary flow, and the patient required admission to the urology service for further evaluation and management.  Gerhard Munch, MD 03/02/12 0004

## 2012-03-03 LAB — CBC
HCT: 31.8 % — ABNORMAL LOW (ref 39.0–52.0)
MCH: 30.7 pg (ref 26.0–34.0)
MCHC: 33.6 g/dL (ref 30.0–36.0)
RDW: 12.9 % (ref 11.5–15.5)

## 2012-03-03 MED ORDER — CIPROFLOXACIN HCL 250 MG PO TABS
250.0000 mg | ORAL_TABLET | Freq: Two times a day (BID) | ORAL | Status: DC
  Administered 2012-03-03 – 2012-03-06 (×7): 250 mg via ORAL
  Filled 2012-03-03 (×9): qty 1

## 2012-03-03 MED ORDER — PANTOPRAZOLE SODIUM 40 MG PO TBEC
40.0000 mg | DELAYED_RELEASE_TABLET | Freq: Every day | ORAL | Status: DC
  Administered 2012-03-03 – 2012-03-05 (×3): 40 mg via ORAL
  Filled 2012-03-03 (×6): qty 1

## 2012-03-03 MED ORDER — ALUM & MAG HYDROXIDE-SIMETH 200-200-20 MG/5ML PO SUSP
30.0000 mL | Freq: Four times a day (QID) | ORAL | Status: DC | PRN
  Administered 2012-03-03: 30 mL via ORAL
  Filled 2012-03-03: qty 30

## 2012-03-03 NOTE — Progress Notes (Signed)
Brief Pharmacy note: may adjust antibiotics for renal function.  Currently on Cipro 250mg  po twice daily. No hx of renal dysfunction from H&P. Unfortunately no Serum Creatinine ordered, cannot determine clearance.  Thank you,  Otho Bellows PharmD 03/03/2012 1:10 PM

## 2012-03-03 NOTE — Progress Notes (Addendum)
Patient ID: Wesley [name], male   DOB: 04-May-1951, 61 y.o.   MRN: [id number]   Subjective: Patient reports a relatively uneventful night. A few small clots last night but otherwise his cath has been draining well urine is relatively light pink in color. Hemoglobin this morning is relatively stable from 48 hours ago.  Objective: Vital signs in last 24 hours: Temp:  [97.8 F (36.6 C)-98.9 F (37.2 C)] 98.5 F (36.9 C) (05/27 0631) Pulse Rate:  [78-90] 85  (05/27 0631) Resp:  [14-20] 14  (05/27 0631) BP: (125-133)/(72-81) 133/81 mmHg (05/27 0631) SpO2:  [95 %-97 %] 97 % (05/27 0631)  Intake/Output from previous day: 05/26 0701 - 05/27 0700 In: 9255 [P.O.:1380; I.V.:2300] Out: 9575 [Urine:9575] Intake/Output this shift: Total I/O In: -  Out: 500 [Urine:500]  Physical Exam:  Constitutional: Vital signs reviewed. WD WN in NAD   Eyes: PERRL, No scleral icterus.   Cardiovascular: RRR Pulmonary/Chest: Normal effort Abdominal: Soft. Non-tender, non-distended, bowel sounds are normal, no masses, organomegaly, or guarding present.  Genitourinary: Indwelling catheter without concerning findings. Extremities: No cyanosis or edema   Lab Results:  Basename 03/03/12 0434 03/01/12 1809  HGB 10.7* 11.7*  HCT 31.8* 34.4*   BMET No results found for this basename: NA:2,K:2,CL:2,CO2:2,GLUCOSE:2,BUN:2,CREATININE:2,CALCIUM:2 in the last 72 hours No results found for this basename: LABPT:3,INR:3 in the last 72 hours No results found for this basename: LABURIN:1 in the last 72 hours Results for orders placed during the hospital encounter of 04/29/09  URINE CULTURE     Status: Normal   Collection Time   04/29/09 10:32 PM      Component Value Range Status Comment   Specimen Description URINE, RANDOM   Final    Special Requests NONE   Final    Colony Count NO GROWTH   Final    Culture NO GROWTH   Final    Report Status 05/01/2009 FINAL   Final     Studies/Results: No results  found.  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 CT showed nothing else of concern in the upper tracts. Today we'll plan on a voiding trial. If his hematuria is minimal he voids okay we can plan on having him follow up with Dr. Jairo [name] in the office for followup cystoscopy to complete his assessment. He would carefully be given a short course of ciprofloxacin given the indwelling catheter.   LOS: 2 days   Wesley [name],Wesley [name] 03/03/2012, 8:44 AM    Voiding small amounts with a few old clots. Not uncomfortable at this time. Will obs for now and reinsert foley/ CBI if needed. Will not d/c home.

## 2012-03-03 NOTE — Progress Notes (Signed)
Pt has only voided about 220cc of bloody urine with clots since the removal of his foley catheter this am. Bladder scan preformed and only found about 150cc of urine in the bladder. Pt has no complaints of discomfort related to his bladder. MD made aware of these findings and no new orders received at this time. MD stated to continue to monitor urine output and bladder scan values, continue IV fluids, and to encourage pt to drink more po fluids. Pt also c/o reflux and MD made aware. Order received for maalox prn and protonix daily. Will carry out these orders/instructions and continue to monitor pt.  Arta Bruce [facility] 5:11 PM 03/03/2012

## 2012-03-04 ENCOUNTER — Other Ambulatory Visit: Payer: Self-pay | Admitting: Urology

## 2012-03-04 ENCOUNTER — Inpatient Hospital Stay (HOSPITAL_COMMUNITY): Payer: PRIVATE HEALTH INSURANCE

## 2012-03-04 NOTE — Progress Notes (Signed)
Patient have been voiding bloody urine but no clot noted, patient denies any bladder pain or discomfort. F/U with plan of care.

## 2012-03-04 NOTE — Progress Notes (Signed)
   CARE MANAGEMENT NOTE 03/04/2012  Patient:  Wesley [name],Wesley [name]   Account Number:  [id number]  Date Initiated:  03/04/2012  Documentation initiated by:  Jiles Crocker  Subjective/Objective Assessment:   ADMITTED WITH CLOT RETENTION     Action/Plan:   INDEPENDENT PRIOR TO ADMISSION, LIVES WITH SPOUSE; POSSIBLY NEED HHC AT DISCHARGE DEPENDING ON PROGRESSION   Anticipated DC Date:  03/11/2012   Anticipated DC Plan:  HOME W HOME HEALTH SERVICES      DC Planning Services  CM consult       Status of service:  In process, will continue to follow Medicare Important Message given?   (If response is "NO", the following Medicare IM given date fields will be blank)  Per UR Regulation:  Reviewed for med. necessity/level of care/duration of stay Comments:  03/04/2012- B Dock Baccam RN, BSN, MHA

## 2012-03-04 NOTE — Progress Notes (Signed)
  Subjective: Patient reports no suprapubic discomfort. He is voiding without difficulty. He reports he has not passed any clots. He does have some urgency and still has some hematuria present.  Objective: Vital signs in last 24 hours: Temp:  [98.3 F (36.8 C)-98.6 F (37 C)] 98.4 F (36.9 C) (05/28 0612) Pulse Rate:  [80-90] 90  (05/28 0612) Resp:  [16-17] 17  (05/28 0612) BP: (146-163)/(79-84) 162/82 mmHg (05/28 0612) SpO2:  [94 %-97 %] 96 % (05/28 0612)  Intake/Output from previous day: 05/27 0701 - 05/28 0700 In: 1475 [P.O.:200; I.V.:1275] Out: 2320 [Urine:2320] Intake/Output this shift:    Physical Exam:  His abdomen is soft and nontender. His urine remains grossly bloody but has no clots.  Lab Results:  Basename 03/03/12 0434 03/01/12 1809  HGB 10.7* 11.7*  HCT 31.8* 34.4*   BMET No results found for this basename: NA:2,K:2,CL:2,CO2:2,GLUCOSE:2,BUN:2,CREATININE:2,CALCIUM:2 in the last 72 hours No results found for this basename: LABPT:3,INR:3 in the last 72 hours No results found for this basename: LABURIN:1 in the last 72 hours Results for orders placed during the hospital encounter of 04/29/09  URINE CULTURE     Status: Normal   Collection Time   04/29/09 10:32 PM      Component Value Range Status Comment   Specimen Description URINE, RANDOM   Final    Special Requests NONE   Final    Colony Count NO GROWTH   Final    Culture NO GROWTH   Final    Report Status 05/01/2009 FINAL   Final     Studies/Results: No results found.  Assessment/Plan: Although he has not passed any clots in his hemoglobin appears to be relatively stable he continues to have gross hematuria. We discussed the options and my concern is that sending him home with gross hematuria runs the risk of recurrent clot retention. I therefore have gone over evaluating him further cystoscopically. He is in agreement with this. I told him I would schedule him for surgery tomorrow.  Continue IV  fluids.  Decrease his Cipro dosage since he is experiencing some loose bowels.  N.p.o. after midnight with plans to proceed with cystoscopy, evacuation of any clots and fulguration of any bleeding points.   LOS: 3 days   Wesley [name] C 03/04/2012, 7:03 AM

## 2012-03-05 ENCOUNTER — Encounter (HOSPITAL_COMMUNITY)
Admission: EM | Disposition: A | Discharge status: Home / Self Care | Payer: Self-pay | Source: Home / Self Care | Attending: Urology

## 2012-03-05 ENCOUNTER — Other Ambulatory Visit: Payer: Self-pay

## 2012-03-05 ENCOUNTER — Encounter (HOSPITAL_COMMUNITY): Payer: Self-pay | Admitting: Anesthesiology

## 2012-03-05 ENCOUNTER — Inpatient Hospital Stay (HOSPITAL_COMMUNITY): Payer: PRIVATE HEALTH INSURANCE | Admitting: Anesthesiology

## 2012-03-05 HISTORY — PX: CYSTOSCOPY: SHX5120

## 2012-03-05 LAB — CBC
MCH: 30.9 pg (ref 26.0–34.0)
Platelets: 281 10*3/uL (ref 150–400)
RBC: 3.24 MIL/uL — ABNORMAL LOW (ref 4.22–5.81)
RDW: 12.9 % (ref 11.5–15.5)
WBC: 6.3 10*3/uL (ref 4.0–10.5)

## 2012-03-05 LAB — BASIC METABOLIC PANEL
CO2: 23 mEq/L (ref 19–32)
Calcium: 8.6 mg/dL (ref 8.4–10.5)
Creatinine, Ser: 1.02 mg/dL (ref 0.50–1.35)
GFR calc Af Amer: 89 mL/min — ABNORMAL LOW (ref >90)
Sodium: 139 mEq/L (ref 135–145)

## 2012-03-05 LAB — SURGICAL PCR SCREEN
MRSA, PCR: NEGATIVE
Staphylococcus aureus: NEGATIVE

## 2012-03-05 SURGERY — CYSTOSCOPY
Anesthesia: General | Site: Bladder | Wound class: Clean Contaminated

## 2012-03-05 MED ORDER — FENTANYL CITRATE 0.05 MG/ML IJ SOLN
INTRAMUSCULAR | Status: AC
  Filled 2012-03-05: qty 4

## 2012-03-05 MED ORDER — KETOROLAC TROMETHAMINE 30 MG/ML IJ SOLN
INTRAMUSCULAR | Status: AC
  Filled 2012-03-05: qty 1

## 2012-03-05 MED ORDER — LACTATED RINGERS IV SOLN
INTRAVENOUS | Status: DC | PRN
  Administered 2012-03-05: 16:00:00 via INTRAVENOUS

## 2012-03-05 MED ORDER — ACETAMINOPHEN 10 MG/ML IV SOLN
INTRAVENOUS | Status: AC
  Filled 2012-03-05: qty 100

## 2012-03-05 MED ORDER — ACETAMINOPHEN 10 MG/ML IV SOLN
INTRAVENOUS | Status: DC | PRN
  Administered 2012-03-05: 1000 mg via INTRAVENOUS

## 2012-03-05 MED ORDER — FENTANYL CITRATE 0.05 MG/ML IJ SOLN
INTRAMUSCULAR | Status: DC | PRN
  Administered 2012-03-05 (×4): 50 ug via INTRAVENOUS

## 2012-03-05 MED ORDER — PROMETHAZINE HCL 25 MG/ML IJ SOLN: 6.2500 mg | INTRAMUSCULAR | Status: DC | PRN

## 2012-03-05 MED ORDER — FENTANYL CITRATE 0.05 MG/ML IJ SOLN: 25.0000 ug | INTRAMUSCULAR | Status: DC | PRN

## 2012-03-05 MED ORDER — KETOROLAC TROMETHAMINE 30 MG/ML IJ SOLN
15.0000 mg | Freq: Once | INTRAMUSCULAR | Status: AC | PRN
  Administered 2012-03-05: 30 mg via INTRAVENOUS

## 2012-03-05 MED ORDER — CEFAZOLIN SODIUM 1-5 GM-% IV SOLN
INTRAVENOUS | Status: DC | PRN
  Administered 2012-03-05: 1 g via INTRAVENOUS

## 2012-03-05 MED ORDER — HYDROMORPHONE HCL PF 1 MG/ML IJ SOLN
INTRAMUSCULAR | Status: AC
  Filled 2012-03-05: qty 1

## 2012-03-05 MED ORDER — PROPOFOL 10 MG/ML IV EMUL
INTRAVENOUS | Status: DC | PRN
  Administered 2012-03-05: 200 mg via INTRAVENOUS

## 2012-03-05 MED ORDER — LACTATED RINGERS IV SOLN
INTRAVENOUS | Status: DC
  Administered 2012-03-05: 18:00:00 via INTRAVENOUS
  Administered 2012-03-05: 1000 mL via INTRAVENOUS

## 2012-03-05 MED ORDER — CEFAZOLIN SODIUM 1-5 GM-% IV SOLN
INTRAVENOUS | Status: AC
  Filled 2012-03-05: qty 50

## 2012-03-05 MED ORDER — FENTANYL CITRATE 0.05 MG/ML IJ SOLN
50.0000 ug | INTRAMUSCULAR | Status: DC | PRN
  Administered 2012-03-05 (×2): 100 ug via INTRAVENOUS
  Administered 2012-03-05: 50 ug via INTRAVENOUS

## 2012-03-05 MED ORDER — MIDAZOLAM HCL 5 MG/5ML IJ SOLN
INTRAMUSCULAR | Status: DC | PRN
  Administered 2012-03-05: 2 mg via INTRAVENOUS
  Administered 2012-03-05: 2.5 mg

## 2012-03-05 MED ORDER — MIDAZOLAM HCL 5 MG/ML IJ SOLN
INTRAMUSCULAR | Status: AC
  Filled 2012-03-05: qty 1

## 2012-03-05 SURGICAL SUPPLY — 15 items
BAG URINE DRAINAGE (UROLOGICAL SUPPLIES) ×2 IMPLANT
BAG URO CATCHER STRL LF (DRAPE) ×2 IMPLANT
CATH FOLEY 2WAY SLVR  5CC 20FR (CATHETERS) ×1
CATH FOLEY 2WAY SLVR 5CC 20FR (CATHETERS) ×1 IMPLANT
CLOTH BEACON ORANGE TIMEOUT ST (SAFETY) ×2 IMPLANT
DRAPE CAMERA CLOSED 9X96 (DRAPES) ×2 IMPLANT
ELECT LOOP MED HF 24F 12D CBL (CLIP) ×2 IMPLANT
GLOVE BIOGEL M STRL SZ7.5 (GLOVE) ×6 IMPLANT
GLOVE SURG SS PI 7.5 STRL IVOR (GLOVE) ×2 IMPLANT
IV NS IRRIG 3000ML ARTHROMATIC (IV SOLUTION) ×8 IMPLANT
KIT ASPIRATION TUBING (SET/KITS/TRAYS/PACK) ×2 IMPLANT
MANIFOLD NEPTUNE II (INSTRUMENTS) ×2 IMPLANT
PACK CYSTO (CUSTOM PROCEDURE TRAY) ×2 IMPLANT
SYRINGE IRR TOOMEY STRL 70CC (SYRINGE) ×2 IMPLANT
TUBING CONNECTING 10 (TUBING) ×2 IMPLANT

## 2012-03-05 NOTE — Progress Notes (Signed)
Wesley [name] aware of pt's complaints and bladder scan as listed in previous note. Order for In&Out cath received. Client now states that he passed a few more blood clots (not witnessed by RN) and a small amount of urine and wishes to see if thing will get better before we cath. Pt agrees to call RN with any worsening symptoms. Will continue to monitor.

## 2012-03-05 NOTE — Brief Op Note (Addendum)
03/01/2012 - 03/05/2012  5:28 PM  PATIENT:  Wesley [name]  61 y.o. male  PRE-OPERATIVE DIAGNOSIS:  gross hematuria  POST-OPERATIVE DIAGNOSIS:  gross hematuria, clot urinary retention, hemorrhagic cystitis  PROCEDURE:  Procedure(s) (LRB): Exam under anesthesia (EUA) CYSTOSCOPY (N/A), clot evacuation, fulguration of bleeding vessel right bladder neck  SURGEON:  Surgeon(s) and Role:    * Antony Haste, MD - Primary   ANESTHESIA:   general  Findings:  EUA - bladder distended, no other abd mass. Prostate flat, radiated on DRE without nodule. Cystoscopy - membranous urethra tight/fixed - 22 and 26 Fr scopes passed through but it was tight; prostatic urethra short, non-obstructive, prostate with minimal vascularity. Patch of neovascularity at right bladder neck with a bleeding vessel. No bladder tumor or mass (Good visualization after clots drained and bleeding fulgurated). Trigone and UO's normal - clear efflux seen from left and right UO.   EBL:  Minimal  DRAINS: Urinary Catheter (Foley)   LOCAL MEDICATIONS USED:  NONE  SPECIMEN:  No Specimen  DISPOSITION OF SPECIMEN:  N/A  COUNTS:  YES  TOURNIQUET:  * No tourniquets in log *  DICTATION: 161096  PLAN OF CARE: Admit for overnight observation  PATIENT DISPOSITION:  PACU - hemodynamically stable.   Delay start of Pharmacological VTE agent (>24hrs) due to surgical blood loss or risk of bleeding: yes

## 2012-03-05 NOTE — Progress Notes (Addendum)
Day of Surgery Subjective: Patient reports need to urinate but cannot. He is uncomfortable with SP pressure and discomfort.   Objective: Vital signs in last 24 hours: Temp:  [98.6 F (37 C)-99.2 F (37.3 C)] 98.6 F (37 C) (05/29 0536) Pulse Rate:  [79-80] 80  (05/29 0536) Resp:  [18] 18  (05/29 0536) BP: (136-151)/(63-88) 136/63 mmHg (05/29 0536) SpO2:  [97 %-98 %] 98 % (05/29 0536)  Intake/Output from previous day: 05/28 0701 - 05/29 0700 In: 2670 [P.O.:360; I.V.:2310] Out: 550 [Urine:550] Intake/Output this shift:    Physical Exam:  General: NAD , but looks uncomfortable\ Abd - bladder mild distended   Lab Results:  Basename 03/05/12 0403 03/03/12 0434  HGB 10.0* 10.7*  HCT 29.3* 31.8*   BMET  Basename 03/05/12 0403  NA 139  K 3.7  CL 106  CO2 23  GLUCOSE 130*  BUN 10  CREATININE 1.02  CALCIUM 8.6   No results found for this basename: LABPT:3,INR:3 in the last 72 hours No results found for this basename: LABURIN:1 in the last 72 hours Results for orders placed during the hospital encounter of 03/01/12  SURGICAL PCR SCREEN     Status: Normal   Collection Time   03/05/12  8:50 AM      Component Value Range Status Comment   MRSA, PCR NEGATIVE  NEGATIVE  Final    Staphylococcus aureus NEGATIVE  NEGATIVE  Final     Studies/Results: Dg Chest 1 View  03/04/2012  *RADIOLOGY REPORT*  Clinical Data: 61 year old male preoperative study.  Prostate cancer.  Hematuria.  CHEST - 1 VIEW  Comparison: 02/17/2009.  Findings: AP view the chest at 1805 hours.  Moderate hiatal hernia. Cardiac size and mediastinal contours are within normal limits. Visualized tracheal air column is within normal limits.  No pneumothorax, pulmonary edema, pleural effusion or confluent pulmonary opacity.  IMPRESSION: No acute cardiopulmonary abnormality. Hiatal hernia.  Original Report Authenticated By: Harley Hallmark, M.D.    Assessment/Plan:  Gross hematuria - I discussed with the patient  and his wife the nature, risks, benefits and alternatives to cystoscopy, clot evacuation, fulguration of any bleeders. All questions answered. They elect to proceed. We discussed the difficulty in treating this situation and the recurrent nature of hematuria.    LOS: 4 days   Antony Haste 03/05/2012, 4:18 PM   ADD: I should add I discussed the patient with Dr. Vernie Ammons. I also reviewed his Epic chart - notes, labs, imaging.

## 2012-03-05 NOTE — OR Nursing (Signed)
03/05/2012  Patient came to holding area with diaper on. No catheter in place.

## 2012-03-05 NOTE — Progress Notes (Signed)
In and out cath completed using aseptic technique with minimal pt complaints. of dark red urine returned. Pt verbalized pelvic pressure relief. Will monitor.

## 2012-03-05 NOTE — Progress Notes (Signed)
Pt complains of inability to void "except a few drops at a time". 2 dime sized clots noted in toilet. Pt verbalizes pelvic pressure/pain rated as 7/10 at this time. Bladder scan=257ml.

## 2012-03-05 NOTE — Transfer of Care (Signed)
Immediate Anesthesia Transfer of Care Note  Patient: Wesley [name]  Procedure(s) Performed: Procedure(s) (LRB): CYSTOSCOPY (N/A)  Patient Location: PACU  Anesthesia Type: General  Level of Consciousness: awake, sedated and confused  Airway & Oxygen Therapy: Patient Spontanous Breathing and Patient connected to face mask oxygen  Post-op Assessment: Report given to PACU RN and Post -op Vital signs reviewed and stable  Post vital signs: Reviewed and stable  Complications: No apparent anesthesia complications

## 2012-03-05 NOTE — Progress Notes (Signed)
Patient ID: Wesley [name], male   DOB: Mar 04, 1951, 60 y.o.   MRN: [id number]  Patient is stable in PACU, but he really wants foley out. He is having bladder spasm and tugging at the catheter. His urine has remained perfectly clear. Foley was removed without difficulty. He's still a bit groggy and will admitted for observation.

## 2012-03-05 NOTE — Progress Notes (Signed)
Subjective: Patient reports noting continued gross hematuria. He passed clots last night and required in and out catheterization due to inability to void secondary to clots. He is in no discomfort at this time.  Objective: Vital signs in last 24 hours: Temp:  [98.1 F (36.7 C)-99.2 F (37.3 C)] 98.6 F (37 C) (05/29 0536) Pulse Rate:  [79-95] 80  (05/29 0536) Resp:  [16-18] 18  (05/29 0536) BP: (136-151)/(63-88) 136/63 mmHg (05/29 0536) SpO2:  [97 %-98 %] 98 % (05/29 0536)  Intake/Output from previous day: 05/28 0701 - 05/29 0700 In: 2670 [P.O.:360; I.V.:2310] Out: 550 [Urine:550] Intake/Output this shift:    Physical Exam:  General:alert, cooperative and no distress GI: soft  Lab Results:  Basename 03/05/12 0403 03/03/12 0434  HGB 10.0* 10.7*  HCT 29.3* 31.8*   BMET  Basename 03/05/12 0403  NA 139  K 3.7  CL 106  CO2 23  GLUCOSE 130*  BUN 10  CREATININE 1.02  CALCIUM 8.6   No results found for this basename: LABPT:3,INR:3 in the last 72 hours No results found for this basename: LABURIN:1 in the last 72 hours Results for orders placed during the hospital encounter of 04/29/09  URINE CULTURE     Status: Normal   Collection Time   04/29/09 10:32 PM      Component Value Range Status Comment   Specimen Description URINE, RANDOM   Final    Special Requests NONE   Final    Colony Count NO GROWTH   Final    Culture NO GROWTH   Final    Report Status 05/01/2009 FINAL   Final     Studies/Results: Dg Chest 1 View  03/04/2012  *RADIOLOGY REPORT*  Clinical Data: 61 year old male preoperative study.  Prostate cancer.  Hematuria.  CHEST - 1 VIEW  Comparison: 02/17/2009.  Findings: AP view the chest at 1805 hours.  Moderate hiatal hernia. Cardiac size and mediastinal contours are within normal limits. Visualized tracheal air column is within normal limits.  No pneumothorax, pulmonary edema, pleural effusion or confluent pulmonary opacity.  IMPRESSION: No acute  cardiopulmonary abnormality. Hiatal hernia.  Original Report Authenticated By: Harley Hallmark, M.D.    Assessment/Plan: His gross hematuria persists. It is almost certainly secondary to the effects of radiation for his prostate cancer. He discussed the fact that the bleeding is most likely from his prostate but also could be from the area of the bladder neck region that is also affected by the radiation. I have discussed with him immediate, intermediate and long-term plans for management of his gross hematuria. He will undergo cystoscopy with clot evacuation and fulguration of any obvious bleeding points of the prostatic urethra or bladder later this afternoon. Once his hematuria has been controlled and he is no longer at risk for clot retention he can be discharged and then if his hematuria persists to any degree we discussed using hyperbaric oxygen it would appear this would be helpful based on the findings at the time of his cystoscopy. Long-term I have initiated 5 alpha reductase therapy in order to prevent further bleeding from the prostate. I have gone over the procedure with him today in detail including its risks and complications and the alternatives. He understands and has elected to proceed.  He will undergo cystoscopy with clot evacuation and fulguration of bleeders this afternoon.  If his urine is clear I told him it may be possible to allow him to go home this evening otherwise he may need to stay  until the morning with anticipated discharge at that time.   LOS: 4 days   Rashada Klontz C 03/05/2012, 7:05 AM

## 2012-03-05 NOTE — Addendum Note (Signed)
Addendum  created 03/05/12 1828 by Elesa Massed, CRNA   Modules edited:Charges VN

## 2012-03-05 NOTE — Anesthesia Postprocedure Evaluation (Signed)
  Anesthesia Post-op Note  Patient: Wesley [name]  Procedure(s) Performed: Procedure(s) (LRB): CYSTOSCOPY (N/A)  Patient Location: PACU  Anesthesia Type: General  Level of Consciousness: awake and alert   Airway and Oxygen Therapy: Patient Spontanous Breathing  Post-op Pain: mild  Post-op Assessment: Post-op Vital signs reviewed, Patient's Cardiovascular Status Stable, Respiratory Function Stable, Patent Airway and No signs of Nausea or vomiting  Post-op Vital Signs: stable  Complications: No apparent anesthesia complications

## 2012-03-05 NOTE — Progress Notes (Signed)
In and out cath done using aseptic technique. 500 ml of yellow urine returned. Pt verbalize the relief of pelvic pressure. Will continue to monitor patient. Roosvelt Maser, RN

## 2012-03-05 NOTE — Anesthesia Preprocedure Evaluation (Signed)
Anesthesia Evaluation  Patient identified by MRN, date of birth, ID band Patient awake    Reviewed: Allergy & Precautions, H&P , NPO status , Patient's Chart, lab work & pertinent test results  Airway Mallampati: II TM Distance: <3 FB Neck ROM: Full    Dental No notable dental hx.    Pulmonary neg pulmonary ROS,  breath sounds clear to auscultation  Pulmonary exam normal       Cardiovascular negative cardio ROS  Rhythm:Regular Rate:Normal     Neuro/Psych negative neurological ROS  negative psych ROS   GI/Hepatic negative GI ROS, Neg liver ROS,   Endo/Other  negative endocrine ROS  Renal/GU negative Renal ROS  negative genitourinary   Musculoskeletal negative musculoskeletal ROS (+)   Abdominal   Peds negative pediatric ROS (+)  Hematology negative hematology ROS (+)   Anesthesia Other Findings   Reproductive/Obstetrics negative OB ROS                           Anesthesia Physical Anesthesia Plan  ASA: II  Anesthesia Plan: General   Post-op Pain Management:    Induction: Intravenous  Airway Management Planned: LMA  Additional Equipment:   Intra-op Plan:   Post-operative Plan:   Informed Consent: I have reviewed the patients History and Physical, chart, labs and discussed the procedure including the risks, benefits and alternatives for the proposed anesthesia with the patient or authorized representative who has indicated his/her understanding and acceptance.   Dental advisory given  Plan Discussed with: CRNA  Anesthesia Plan Comments:         Anesthesia Quick Evaluation

## 2012-03-06 ENCOUNTER — Encounter (HOSPITAL_COMMUNITY): Payer: Self-pay | Admitting: Urology

## 2012-03-06 NOTE — Discharge Summary (Signed)
Physician Discharge Summary  Patient ID: Wesley [name] MRN: [id number] DOB/AGE: 1951-09-14 61 y.o.  Admit date: 03/01/2012 Discharge date: 03/06/2012  Admission Diagnoses: #1. Gross hematuria 2. Clot retention  Discharge Diagnoses:  Principal Problem:  *Acute retention of urine Active Problems:  Hematuria   Discharged Condition: good  Hospital Course: Patient had been having difficulty with gross hematuria and was being managed initially as an outpatient. Despite the placement of a larger bore catheter and irrigation of clots from the bladder he redeveloped clot urinary retention and was admitted to the hospital. He was placed on continuous bladder irrigation with initial clearance of the hematuria however it recurred and persisted. He therefore was taken to the operating room where he was found to have a bleeding vessel at the bladder neck it was fulgurated after clots were removed from the bladder. After that was performed his urine remained clear and he had no difficulty voiding. He is therefore felt ready for discharge at this time.  Discharge Exam: Blood pressure 151/84, pulse 92, temperature 98.1 F (36.7 C), temperature source Oral, resp. rate 18, height 5\' 9"  (1.753 m), weight 75.978 kg (167 lb 8 oz), SpO2 99.00%. His abdomen is soft and nontender with no suprapubic mass noted. His urine was observed and noted to be completely clear.  Disposition: 01-Home or Self Care  Discharge Orders    Future Orders Please Complete By Expires   Discontinue IV      Discharge patient        Medication List  As of 03/06/2012  6:44 AM   TAKE these medications         atorvastatin 20 MG tablet   Commonly known as: LIPITOR   Take 20 mg by mouth daily.      finasteride 5 MG tablet   Commonly known as: PROSCAR   Take 5 mg by mouth daily.      mesalamine 400 MG EC tablet   Commonly known as: ASACOL   Take 2,400 mg by mouth 2 (two) times daily.      Tamsulosin HCl 0.4 MG Caps   Commonly known as: FLOMAX   Take 0.4 mg by mouth daily.         ASK your doctor about these medications         dutasteride 0.5 MG capsule   Commonly known as: AVODART   Take 0.5 mg by mouth daily.             SignedGarnett Farm 03/06/2012, 6:44 AM

## 2012-03-06 NOTE — Discharge Instructions (Signed)
Continue taking the finasteride as prescribed.  Contact me if we see any further hematuria.  I will plan to otherwise see you back as previously scheduled for your routine prostate cancer followup.

## 2012-03-06 NOTE — Op Note (Signed)
NAMERIGO, LETTS NO.:  [id number]  MEDICAL RECORD NO.:  [id number]  LOCATION:  [facility]02                          FACILITY:  [facility]  PHYSICIAN:  Jerilee Field, MD   DATE OF BIRTH:  August 17, 1951  DATE OF PROCEDURE: DATE OF DISCHARGE:  03/01/2012                              OPERATIVE REPORT   PREOPERATIVE DIAGNOSIS:  Gross hematuria.  POSTOPERATIVE DIAGNOSES:  Gross hematuria, hemorrhagic cystitis, and clot urinary retention.  PROCEDURES:  Exam under anesthesia with cystoscopy, clot evacuation and fulguration of bleeding vessels at the right bladder neck.  SURGEON:  Jerilee Field, MD.  TYPE OF ANESTHESIA:  General.  INDICATION FOR PROCEDURE:  Mr. Alessio is a 61 year old white male. He underwent radiation and brachytherapy seed implant in 2010.  He has been having gross hematuria on and off, and was admitted with clot retention.  Despite CBI over the past few days, he has developed clot retention numerous times.  I discussed with the patient the nature, risks, benefits and alternatives to cystoscopy with clot evacuation and fulguration of bleeders.  All questions were answered and the patient elected to proceed to the OR.  FINDINGS:  On exam under anesthesia, the patient's bladder was distended, but there was no other abdominal mass.  The prostate was flat on digital rectal exam, consistent with radiation and without mass or nodule.  There were no other masses palpable.  On cystoscopy, the membranous urethra was quite tight and fixed.  The 22 and the 26-French scope did pass through this area, but there was some difficulty with getting the scope through this area.  Prostatic urethra was short and nonobstructed and had minimal vascularity.  There was a patch of neovascularity and blood vessels at the right bladder neck and one of these vessels was actively bleeding.  After all the clots had been evacuated from the bladder and these vessels had been  fulgurated, visualization was good.  The bladder was normal without tumor or mass. Also, the trigone and ureteral orifices were normal and clear efflux of urine was seen from the left and the right ureteral orifice.  DESCRIPTION OF PROCEDURE:  After consent was obtained, the patient was brought to the operating room.  A time-out was performed to confirm the patient and procedure.  After adequate anesthesia, he was placed on lithotomy position, and an exam under anesthesia was performed.  He was then prepped and draped in the usual fashion.  A 22-French cystoscope was passed per urethra into the bladder with the above findings. Initially, there was no visualization.  A Toomey syringe was hooked up to the 22-French cystoscope and approximately 500 mL of clots and additional urine was evacuated from the bladder.  The clots did appear fresh.  After clearing the bladder of all clots, cystoscopy was performed, and the vessel was noted to be bleeding at the right bladder neck, and a photo was taken of this bleeding vessel.  The vessel and the patch of neovascularity was fulgurated with the gyrus loop.  Fulguration was just carried out on the mucosal surface and was not excessive or deep.  After the bleeding was remedied,  cystoscopy was again performed, and the bladder again noted  to be normal as dictated above.  There was no bleeding from the prostate and excellent hemostasis from the fulguration.  Therefore, the scope was removed and a 20-French 2-way catheter was placed and left to gravity drainage.  The urine was clear. The patient was then awakened, taken to recovery room in stable condition.  COMPLICATIONS:  None.  ESTIMATED BLOOD LOSS:  Minimal.  DRAINS:  Twenty-French Foley.  SPECIMENS:  No specimens.  DISPOSITION:  The patient stable to PACU.  Will admit him for overnight observation.          ______________________________ Jerilee Field, MD     ME/MEDQ  D:   03/05/2012  T:  03/06/2012  Job:  [id number]

## 2015-05-03 HISTORY — PX: INGUINAL HERNIA REPAIR: SHX194

## 2016-10-08 HISTORY — PX: OTHER SURGICAL HISTORY: SHX169

## 2017-02-11 HISTORY — PX: LAPAROSCOPIC PARAESOPHAGEAL HERNIA REPAIR: SHX6307

## 2019-02-19 ENCOUNTER — Other Ambulatory Visit: Payer: Self-pay | Admitting: Urology

## 2019-03-07 ENCOUNTER — Encounter (HOSPITAL_BASED_OUTPATIENT_CLINIC_OR_DEPARTMENT_OTHER): Payer: Self-pay

## 2019-03-09 ENCOUNTER — Encounter (HOSPITAL_BASED_OUTPATIENT_CLINIC_OR_DEPARTMENT_OTHER): Payer: Self-pay

## 2019-03-09 ENCOUNTER — Other Ambulatory Visit: Payer: Self-pay

## 2019-03-09 NOTE — Progress Notes (Signed)
Mr. Smisek stated he had EKG, ECHO, and Stress test completed recently at Dr. Shearon Stalls office, faxed records request.

## 2019-03-09 NOTE — Progress Notes (Signed)
SPOKE W/  Shanon Brow     SCREENING SYMPTOMS OF COVID 19:   COUGH--NO  RUNNY NOSE---NO   SORE THROAT---NO  NASAL CONGESTION----NO  SNEEZING----NO  SHORTNESS OF BREATH---NO  DIFFICULTY BREATHING---NO  TEMP >100.0 -----NO  UNEXPLAINED BODY ACHES------NO  CHILLS -------- NO  HEADACHES ---------NO  LOSS OF SMELL/ TASTE --------NO    HAVE YOU OR ANY FAMILY MEMBER TRAVELLED PAST 14 DAYS OUT OF THE   COUNTY---travelled to [place]----NO COUNTRY----NO  HAVE YOU OR ANY FAMILY MEMBER BEEN EXPOSED TO ANYONE WITH COVID 19? NO

## 2019-03-09 NOTE — Progress Notes (Signed)
Spoke with: Shanon Brow NPO:  After Midnight, no gum, candy, or mints   Arrival time:530AM Labs: Istate, EKG AM medications: Imuran, Keppra Pre op orders: Yes Ride home:  Neoma Laming (wife) [phone] or Keenan Bachelor (son) [phone]

## 2019-03-09 NOTE — H&P (Signed)
HPI: Wesley [name] is a 68 year-old male with a urethral stricture and bladder calculi.  He first noticed the symptoms 11/20/2018. He did see the blood in his urine. He has seen blood clots.   He does have a burning sensation when he urinates. He is currently having trouble urinating.   He has had kidney stones. He is having pain. He has not recently had unwanted weight loss.  02/05/19: He reports that beginning on 11/20/18 he had an episode of gross hematuria it occurred 24 hours later and then 10 days later. He has had 2 more episodes the most recent was last night when he was unable to urinate for some time. He has perform self catheterization in the distant past and thought this might become necessary but he eventually was able to void but passed a long whitish material that he described as mucoid in character. He has seen some clots and reports that the hematuria when it occurs is total hematuria. He had not been experiencing any flank pain but said he has begun to have some slight discomfort on the right-hand side that he describes as 1/10 in severity. He denies any dysuria.     ALLERGIES: Sulfa Drugs    MEDICATIONS: Androgel 50 mg/5 gram (1 %) gel in packet 2 pack Topical Q AM  Celecoxib 100 mg capsule 1 capsule PO Q HS  Sildenafil Citrate 20 mg tablet 1-5 tablet PO PRN  Tamsulosin Hcl 0.4 mg capsule 1 capsule PO Q PM  Toviaz 8 mg tablet, extended release 24 hr 1 tablet PO Daily  Amlodipine Besylate 5 mg tablet Oral  Azathioprine 50 mg tablet Oral  Celecoxib 100 mg capsule 0 Oral Bedtime  Ciclopirox 0.77 % cream External  Dicyclomine Hcl 20 mg tablet  Doxepin Hcl 50 mg capsule  LevETIRAcetam 500 MG Oral Tablet Oral  Oxtellar Xr 600 mg tablet, extended release 24 hr Oral     GU PSH: Cysto Bladder Ureth Biopsy - 2013 Cystoscopy Ureteroscopy - 2010 Locm 300-399Mg /Ml Iodine,1Ml - 02/11/2019 TRANSPERI NEEDLE PLACE, PROS - 2015      PSH Notes: Inguinal Hernia Repair, Surgery  Prostate Transperineal Placement Of Needles, Cystoscopy With Biopsy, Craniotomy Suboccipital Excision Of Meningioma, Cystoscopy With Ureteroscopy Left, Arthroscopy Knee Right   NON-GU PSH: Knee Arthroscopy; Dx - 2009 Remove Brain Lining Lesion - 2011    GU PMH: Gross hematuria, He is experienced gross hematuria and has had previous ionizing radiation and therefore a full evaluation will be undertaken. He has had a history of stones as well so I will obtain a CT scan to evaluate the upper tract and then he will return for completion of his workup with cystoscopy. - 02/05/2019 History of prostate cancer (Stable), Although he has a history of prostate cancer treated with radiation I told him I was not particularly concerned about the possibility of this being secondary to prostate cancer but rather more likely secondary to the effects of radiation. - 02/05/2019, (Stable), His prostate cancer remains under excellent control with no abnormality noted on DRE and PSA that remains undetectable., - 10/06/2018 (Stable), His erectile dysfunction is likely multifactorial but most likely secondary to the treatment of his prostate cancer with radiation. Also up with me as previously scheduled for his annual prostate cancer follow-up appointment, - 04/07/2018 (Stable), At this point it appears his prostate cancer has been well controlled with a PSA that is currently 0.017. I will continue to see him on a yearly basis., - 09/26/2017 (Stable), His prostate  remains flat and benign to examination. His PSA remains undetectable. I will continue to see him on a yearly basis for DRE and PSA., - 09/25/2016, History of prostate cancer, - 2016 Primary hypogonadism (Stable), We discussed the fact that his serum testosterone level was found to be above the reference range. He felt that that was the day that he used to of the packets so what I have recommended is he decreased the dosage down to 1 packet daily. - 10/06/2018, Hypogonadism,  testicular, - 2016 Urinary Urgency (Worsening), He uses DDAVP at night but still is having some nocturia and also is having some urgency and therefore I have recommended a trial of Toviaz to see if he notes this helps any better than the Myrbetriq. 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 - 04/07/2018 Urinary Frequency, He is having some new frequency and his nocturia has increased as well. He is using tamsulosin and also Celebrex which resulted in resolution of his nocturia but what I have recommended is a trial of Myrbetriq 25 mg. I have given him samples of this. - 09/26/2017, Increased urinary frequency, - 2014 Nocturia, Nocturia - 2016 BPH w/LUTS, Benign prostatic hyperplasia with urinary obstruction - 2015 Unil Inguinal Hernia W/O obst or gang,non-recurrent, Right inguinal hernia - 2015 Elevated PSA, PSA,Elevated - 2014 Peyronies Disease, Peyronie's disease - 2014 Renal cyst, Renal cysts, acquired, bilateral - 2014 Ureteral calculus, Ureteral Stone - 2014 Urinary Retention, Unspec, Incomplete bladder emptying - 2014      PMH Notes:  Prostate cancer:  Date of diagnosis: 10/29/08  Pretreatment PSA: 5.2  Gleason score: 4+3=7, 5/12 cores positive with only one core positive for Gleason 4+3.  Stage - T1c  Treatment: External Beam Radiation 3-4/10 / Brachytherapy 03/04/09    Nephrolithiasis: In 7/10 he underwent a CT scan because of abdominal complaints and was found to have a 3 mm distal right ureteral calculus but no renal calculi were identified. He subsequently passed right ureteral stone. In 10/10 he underwent left ureteroscopy for what appeared to be a stone overlying the left ureter without progression. The stone was located over the sacral region however at the time of his retrograde pyelogram and ureteroscopy no stone could be identified within the ureter.   Nocturia: He had noted nocturia as well as  some daytime frequency and had been treated with a 5 alpha reductase inhibitor as well as an alpha-blocker but remains symptomatic. I tried DDAVP however this resulted in worsening of his hesitancy he did not want to remain on this therapy. I had prescribed anticholinergics in the past but he was reluctant to reinitiate this form of therapy because of his history of ulcerative colitis but once that was under control he elected to undergo a trial of VESIcare at a 10 mg dose which was ineffective. I recommended DDAVP which was started in 5/13.   Hypogonadism: He was found to have hypogonadism with a low free testosterone level of 5.5 in 2/11. Initially I recommended he not undergo testosterone replacement since he had just been diagnosed with prostate cancer. Now that he has shown a significant fall and stabilization of his PSA I therefore initiated testosterone replacement therapy in 5/13.  Current therapy: AndroGel 4 pumps q.a.m.   BPH with outlet obstruction: He developed hematuria with clot retention and eventually voided without difficulty but I placed him on finasteride (5/13) in order to prevent this from occurring  in the future.  Current therapy: Tamsulosin and finasteride     NON-GU PMH: Encounter for general adult medical examination without abnormal findings, Encounter for preventive health examination - 2014/01/15 Personal history of other diseases of the circulatory system, History of hypertension - 2013-01-15 Personal history of other endocrine, nutritional and metabolic disease, History of hypercholesterolemia - 01-15-2013 Personal history of other specified conditions, History of heartburn - 01/15/13    FAMILY HISTORY: Death In The Family Father - Father Death In The Family Mother - Mother Family Health Status Number - Runs In Family   SOCIAL HISTORY: Marital Status: Married Preferred Language: English; Ethnicity: Not Hispanic Or Latino; Race: White Current Smoking Status: Patient does not smoke  anymore.   Tobacco Use Assessment Completed: Used Tobacco in last 30 days? Does drink.  Drinks 3 caffeinated drinks per day.     Notes: Former smoker, Caffeine Use, Marital History - Currently Married, Alcohol Use, Tobacco Use, Occupation:   REVIEW OF SYSTEMS:    GU Review Male:   Patient denies erection problems, have to strain to urinate , burning/ pain with urination, penile pain, leakage of urine, get up at night to urinate, hard to postpone urination, stream starts and stops, frequent urination, and trouble starting your stream.  Gastrointestinal (Upper):   Patient denies nausea, vomiting, and indigestion/ heartburn.  Gastrointestinal (Lower):   Patient denies diarrhea and constipation.  Constitutional:   Patient denies fever, night sweats, weight loss, and fatigue.  Skin:   Patient denies skin rash/ lesion and itching.  Eyes:   Patient denies blurred vision and double vision.  Ears/ Nose/ Throat:   Patient denies sore throat and sinus problems.  Hematologic/Lymphatic:   Patient denies swollen glands and easy bruising.  Cardiovascular:   Patient denies leg swelling and chest pains.  Respiratory:   Patient denies cough and shortness of breath.  Endocrine:   Patient denies excessive thirst.  Musculoskeletal:   Patient denies back pain and joint pain.  Neurological:   Patient denies headaches and dizziness.  Psychologic:   Patient denies depression and anxiety.   VITAL SIGNS:    Weight 158 lb / 71.67 kg  Height 68 in / 172.72 cm  BP 156/81 mmHg  Pulse 64 /min  Temperature 97.0 F / 36.1 C  BMI 24.0 kg/m   GU PHYSICAL EXAMINATION:    Urethral Meatus: Normal size. No lesion, no wart, no discharge, no polyp. Normal location.  Penis: Circumcised, no warts, no cracks. No dorsal Peyronie's plaques, no left corporal Peyronie's plaques, no right corporal Peyronie's plaques, no scarring, no warts. No balanitis, no meatal stenosis.   ULTI-SYSTEM PHYSICAL EXAMINATION:    Constitutional:  Well-nourished. No physical deformities. Normally developed. Good grooming.  Neck: Neck symmetrical, not swollen. Normal tracheal position.  Respiratory: No labored breathing, no use of accessory muscles.   Cardiovascular: Normal temperature, normal extremity pulses, no swelling, no varicosities.  Skin: No paleness, no jaundice, no cyanosis. No lesion, no ulcer, no rash.  Neurologic / Psychiatric: Oriented to time, oriented to place, oriented to person. No depression, no anxiety, no agitation.  Gastrointestinal: No mass, no tenderness, no rigidity, non obese abdomen.  Eyes: Normal conjunctivae. Normal eyelids.  Ears, Nose, Mouth, and Throat: Left ear no scars, no lesions, no masses. Right ear no scars, no lesions, no masses. Nose no scars, no lesions, no masses. Normal hearing. Normal lips.  Musculoskeletal: Normal gait and station of head and neck        PAST DATA REVIEWED:  Source Of History:  Patient  Lab Test Review:   BUN/Creatinine  Records Review:   Previous Patient Records, POC Tool  X-Ray Review: C.T. Hematuria: Reviewed Films. Reviewed Report. Discussed With Patient. CT ABDOMEN AND PELVIS WITHOUT AND WITH CONTRAST TECHNIQUE: Multidetector CT imaging of the abdomen and pelvis was performed following the standard protocol before and following the bolus administration of intravenous contrast. CONTRAST: 125 cc Omnipaque 300 COMPARISON: 06/28/2009 FINDINGS: Lower chest: Unremarkable Hepatobiliary: No suspicious focal abnormality within the liver parenchyma. There is no evidence for gallstones, gallbladder wall thickening, or pericholecystic fluid. No intrahepatic or extrahepatic biliary dilation. Pancreas: No focal mass lesion. No dilatation of the main duct. No intraparenchymal cyst. No peripancreatic edema. Spleen: No splenomegaly. No focal mass lesion. Adrenals/Urinary Tract: No adrenal nodule or mass. Multiple stones are identified in the right kidney (approximately 8). Largest stone is in  the upper pole measuring 6 x 5 mm. Remaining stones are in the 3-4 mm size range. No stones are seen in the left kidney. No ureteral stones. 9 x 11 mm stone is identified in the posterior left urinary bladder (71/2) and confirmed to be in the bladder lumen on sagittal image 93 of series 1001. After IV contrast administration, no overtly suspicious soft tissue lesion is identified in either kidney. There are multiple bilateral renal cysts. 3 cysts in the left kidney range in size from 11 mm up to 9.4 cm, but all 3 have simple (Bosniak I) imaging appearance. 4.1 x 4.9 cm complex cyst in the interpolar region of the right kidney has internal septation with perceptible enhancement in some trace septal calcification. No mural thickening or irregularity. No mural nodularity. Delayed imaging shows no wall thickening or soft tissue filling defect in either intrarenal collecting system or renal pelvis. Neither ureter is completely opacified, but there is no evidence for focal hydroureter or mass lesion along the length of either ureter. Mild bladder wall trabeculation evident without a definite focal bladder wall abnormality. Stomach/Bowel: Deformity in the proximal stomach suggests previous fundoplication. Stomach otherwise unremarkable. Duodenum is normally positioned as is the ligament of Treitz. No small bowel wall thickening. No small bowel dilatation. The terminal ileum is normal. The appendix is normal. No gross colonic mass. No colonic wall thickening. Vascular/Lymphatic: There is abdominal aortic atherosclerosis without aneurysm. There is no gastrohepatic or hepatoduodenal ligament lymphadenopathy. No intraperitoneal or retroperitoneal lymphadenopathy. No pelvic sidewall lymphadenopathy. Reproductive: Brachytherapy seeds noted in the prostate parenchyma. Other: No intraperitoneal free fluid. Musculoskeletal: Small left groin hernia contains only fat. No worrisome lytic or sclerotic osseous abnormality. IMPRESSION:  1. Nonobstructing right renal stones measuring up to 6 x 5 mm. 2. 9 x 11 mm stone identified posterior left urinary bladder. Stone appears clear of the UVJ. 3. Bilateral renal cysts including a 4.9 cm Bosniak II F cyst of the right kidney. Follow-up CT in 6 months could be used to ensure stability. 4. Aortic Atherosclerois     09/23/18 09/13/17 05/30/17 09/18/16 09/14/15 09/17/14 03/03/14 08/20/13  PSA  Total PSA <0.015 ng/mL 0.017 ng/mL <0.015 ng/dL < 0.015  <0.01  <0.01  0.02  0.01     09/23/18 05/30/17 04/05/14 03/03/14 08/21/13 08/19/12 03/25/12  Hormones  Testosterone, Total 1350.7 ng/dL 543.9 ng/dL 467  273  280  478.95  415.79     02/05/19  General Chemistry  Creatinine 0.8 mg/dL   PROCEDURES:         Flexible Cystoscopy on 02/18/19 Meatus:  Normal size. Normal location. Normal condition.  Urethra:  Moderate bulbous stricture.        Urinalysis w/Scope - 81001 Dipstick Dipstick Cont'd Micro  Color: Yellow Bilirubin: Neg mg/dL WBC/hpf: 0 - 5/hpf  Appearance: Clear Ketones: Neg mg/dL RBC/hpf: 20 - 40/hpf  Specific Gravity: 1.025 Blood: 3+ ery/uL Bacteria: Few (10-25/hpf)  pH: 6.0 Protein: Trace mg/dL Cystals: NS (Not Seen)  Glucose: Neg mg/dL Urobilinogen: 1.0 mg/dL Casts: NS (Not Seen)    Nitrites: Neg Trichomonas: Not Present    Leukocyte Esterase: Neg leu/uL Mucous: Not Present      Epithelial Cells: NS (Not Seen)      Yeast: NS (Not Seen)      Sperm: Not Present    ASSESSMENT/PLAN:      ICD-10 Details  1 GU:   Gross hematuria - R31.0 Stable - His gross hematuria appears to be secondary to though newly diagnosed bladder calculus.  2   Bladder Stone - N21.0 The source of his gross hematuria is almost certainly the stones located within his bladder on CT scan. I was not able to visualize these due to his stricture.  3   Renal cyst - N28.1 Bilateral, Stable - Has bilateral renal cysts with a 4.9 cm Bosniak 2 F cyst in the right kidney.  4   Renal calculus - N20.0 Right, He  has nonobstructing right renal calculi. I do not feel that this in any way contributed to his gross hematuria  5   Bulbar urethral stricture - N35.011 I discovered a moderate bulbar urethral stricture that would not allow passage of the flexible cystoscope even with pressure and I did not want to cause him any discomfort in the office since I am going to have to take care of his bladder calculi as well so we discussed performing dilation at the time of his cystolitholapaxy.          Notes:   The cause of his gross hematuria is bladder calculi. I therefore have discussed the need for cystolitholapaxy. We went over the procedure in detail including how the procedure was performed, its risks and complications, the outpatient nature of the procedure, the probability of success and the anticipated postoperative course. He understands and has elected to proceed.

## 2019-03-10 ENCOUNTER — Other Ambulatory Visit (HOSPITAL_COMMUNITY)
Admission: RE | Admit: 2019-03-10 | Discharge: 2019-03-10 | Disposition: A | Discharge status: Home / Self Care | Payer: Medicare Other | Source: Ambulatory Visit | Attending: Urology | Admitting: Urology

## 2019-03-10 DIAGNOSIS — Z1159 Encounter for screening for other viral diseases: Secondary | ICD-10-CM | POA: Insufficient documentation

## 2019-03-11 LAB — NOVEL CORONAVIRUS, NAA (HOSP ORDER, SEND-OUT TO REF LAB; TAT 18-24 HRS): SARS-CoV-2, NAA: NOT DETECTED

## 2019-03-12 NOTE — Progress Notes (Signed)
SPOKE W/  _David     SCREENING SYMPTOMS OF COVID 19:   COUGH--n  RUNNY NOSE--- n  SORE THROAT---n  NASAL CONGESTION----  SNEEZING----n  SHORTNESS OF BREATH---n  DIFFICULTY BREATHING---n  TEMP >100.0 -----n  UNEXPLAINED BODY ACHES------n  CHILLS -------- n  HEADACHES ---------n  LOSS OF SMELL/ TASTE --------n  Reminded to self quarantine  HAVE YOU OR ANY FAMILY MEMBER TRAVELLED PAST 14 DAYS OUT OF THE   COUNTY---n STATE----n COUNTRY----n  HAVE YOU OR ANY FAMILY MEMBER BEEN EXPOSED TO ANYONE WITH COVID 19? n

## 2019-03-12 NOTE — Anesthesia Preprocedure Evaluation (Addendum)
Anesthesia Evaluation  Patient identified by MRN, date of birth, ID band Patient awake    Reviewed: Allergy & Precautions, NPO status , Patient's Chart, lab work & pertinent test results  Airway Mallampati: II  TM Distance: >3 FB Neck ROM: Full    Dental no notable dental hx. (+) Teeth Intact, Dental Advisory Given,    Pulmonary former smoker,    Pulmonary exam normal breath sounds clear to auscultation       Cardiovascular Exercise Tolerance: Good hypertension, Pt. on medications Normal cardiovascular exam Rhythm:Regular Rate:Normal     Neuro/Psych Seizures -, Well Controlled,  Simple partial on keppra negative neurological ROS     GI/Hepatic Neg liver ROS, hiatal hernia,   Endo/Other    Renal/GU Renal disease     Musculoskeletal   Abdominal   Peds  Hematology   Anesthesia Other Findings   Reproductive/Obstetrics                           Lab Results  Component Value Date   WBC 6.3 03/05/2012   HGB 10.0 (L) 03/05/2012   HCT 29.3 (L) 03/05/2012   MCV 90.4 03/05/2012   PLT 281 03/05/2012    Lab Results  Component Value Date   CREATININE 1.02 03/05/2012   BUN 10 03/05/2012   NA 139 03/05/2012   K 3.7 03/05/2012   CL 106 03/05/2012   CO2 23 03/05/2012    Anesthesia Physical Anesthesia Plan  ASA: III  Anesthesia Plan: General   Post-op Pain Management:    Induction: Intravenous  PONV Risk Score and Plan: 3 and Treatment may vary due to age or medical condition, Ondansetron and Dexamethasone  Airway Management Planned: LMA  Additional Equipment:   Intra-op Plan:   Post-operative Plan:   Informed Consent: I have reviewed the patients History and Physical, chart, labs and discussed the procedure including the risks, benefits and alternatives for the proposed anesthesia with the patient or authorized representative who has indicated his/her understanding and acceptance.      Dental advisory given  Plan Discussed with:   Anesthesia Plan Comments:        Anesthesia Quick Evaluation

## 2019-03-12 NOTE — Progress Notes (Signed)
Received ekg dated 07-10-2018 and stress echo dated 08-05-2018 via fax from dr renaldo's office, placed in chart.

## 2019-03-13 ENCOUNTER — Encounter (HOSPITAL_BASED_OUTPATIENT_CLINIC_OR_DEPARTMENT_OTHER)
Admission: RE | Disposition: A | Discharge status: Home / Self Care | Payer: Self-pay | Source: Home / Self Care | Attending: Urology

## 2019-03-13 ENCOUNTER — Other Ambulatory Visit: Payer: Self-pay

## 2019-03-13 ENCOUNTER — Ambulatory Visit (HOSPITAL_BASED_OUTPATIENT_CLINIC_OR_DEPARTMENT_OTHER): Payer: Medicare Other | Admitting: Anesthesiology

## 2019-03-13 ENCOUNTER — Ambulatory Visit (HOSPITAL_BASED_OUTPATIENT_CLINIC_OR_DEPARTMENT_OTHER)
Admission: RE | Admit: 2019-03-13 | Discharge: 2019-03-13 | Disposition: A | Discharge status: Home / Self Care | Payer: Medicare Other | Attending: Urology | Admitting: Urology

## 2019-03-13 ENCOUNTER — Encounter (HOSPITAL_BASED_OUTPATIENT_CLINIC_OR_DEPARTMENT_OTHER): Payer: Self-pay | Admitting: *Deleted

## 2019-03-13 DIAGNOSIS — I10 Essential (primary) hypertension: Secondary | ICD-10-CM | POA: Diagnosis not present

## 2019-03-13 DIAGNOSIS — N210 Calculus in bladder: Secondary | ICD-10-CM | POA: Diagnosis not present

## 2019-03-13 DIAGNOSIS — N35919 Unspecified urethral stricture, male, unspecified site: Secondary | ICD-10-CM | POA: Diagnosis not present

## 2019-03-13 DIAGNOSIS — E291 Testicular hypofunction: Secondary | ICD-10-CM | POA: Diagnosis not present

## 2019-03-13 DIAGNOSIS — R310 Gross hematuria: Secondary | ICD-10-CM | POA: Diagnosis not present

## 2019-03-13 DIAGNOSIS — E7800 Pure hypercholesterolemia, unspecified: Secondary | ICD-10-CM | POA: Diagnosis not present

## 2019-03-13 DIAGNOSIS — N281 Cyst of kidney, acquired: Secondary | ICD-10-CM | POA: Diagnosis not present

## 2019-03-13 DIAGNOSIS — Z8546 Personal history of malignant neoplasm of prostate: Secondary | ICD-10-CM | POA: Diagnosis not present

## 2019-03-13 DIAGNOSIS — R569 Unspecified convulsions: Secondary | ICD-10-CM | POA: Insufficient documentation

## 2019-03-13 DIAGNOSIS — N200 Calculus of kidney: Secondary | ICD-10-CM | POA: Diagnosis not present

## 2019-03-13 DIAGNOSIS — Z87891 Personal history of nicotine dependence: Secondary | ICD-10-CM | POA: Insufficient documentation

## 2019-03-13 DIAGNOSIS — Z79899 Other long term (current) drug therapy: Secondary | ICD-10-CM | POA: Insufficient documentation

## 2019-03-13 DIAGNOSIS — Z87442 Personal history of urinary calculi: Secondary | ICD-10-CM | POA: Diagnosis not present

## 2019-03-13 HISTORY — DX: Essential (primary) hypertension: I10

## 2019-03-13 HISTORY — DX: Insomnia, unspecified: G47.00

## 2019-03-13 HISTORY — DX: Unspecified convulsions: R56.9

## 2019-03-13 HISTORY — DX: Personal history of other diseases of the nervous system and sense organs: Z86.69

## 2019-03-13 HISTORY — DX: Personal history of other diseases of the digestive system: Z87.19

## 2019-03-13 HISTORY — DX: Constipation, unspecified: K59.00

## 2019-03-13 HISTORY — DX: Presence of spectacles and contact lenses: Z97.3

## 2019-03-13 HISTORY — PX: CYSTOSCOPY WITH LITHOLAPAXY: SHX1425

## 2019-03-13 HISTORY — DX: Retention of urine, unspecified: R33.9

## 2019-03-13 LAB — POCT I-STAT, CHEM 8
BUN: 22 mg/dL (ref 8–23)
Calcium, Ion: 1.25 mmol/L (ref 1.15–1.40)
Chloride: 104 mmol/L (ref 98–111)
Creatinine, Ser: 0.9 mg/dL (ref 0.61–1.24)
Glucose, Bld: 106 mg/dL — ABNORMAL HIGH (ref 70–99)
HCT: 37 % — ABNORMAL LOW (ref 39.0–52.0)
Hemoglobin: 12.6 g/dL — ABNORMAL LOW (ref 13.0–17.0)
Potassium: 3.8 mmol/L (ref 3.5–5.1)
Sodium: 141 mmol/L (ref 135–145)
TCO2: 26 mmol/L (ref 22–32)

## 2019-03-13 SURGERY — CYSTOSCOPY, WITH BLADDER CALCULUS LITHOLAPAXY
Anesthesia: General | Site: Bladder

## 2019-03-13 MED ORDER — LACTATED RINGERS IV SOLN
INTRAVENOUS | Status: DC
  Administered 2019-03-13: 06:00:00 via INTRAVENOUS
  Filled 2019-03-13: qty 1000

## 2019-03-13 MED ORDER — PROPOFOL 10 MG/ML IV BOLUS
INTRAVENOUS | Status: DC | PRN
  Administered 2019-03-13: 150 mg via INTRAVENOUS

## 2019-03-13 MED ORDER — ARTIFICIAL TEARS OPHTHALMIC OINT
TOPICAL_OINTMENT | OPHTHALMIC | Status: AC
  Filled 2019-03-13: qty 3.5

## 2019-03-13 MED ORDER — PROPOFOL 10 MG/ML IV BOLUS
INTRAVENOUS | Status: AC
  Filled 2019-03-13: qty 40

## 2019-03-13 MED ORDER — FENTANYL CITRATE (PF) 100 MCG/2ML IJ SOLN
INTRAMUSCULAR | Status: DC | PRN
  Administered 2019-03-13 (×2): 25 ug via INTRAVENOUS
  Administered 2019-03-13: 50 ug via INTRAVENOUS

## 2019-03-13 MED ORDER — FENTANYL CITRATE (PF) 100 MCG/2ML IJ SOLN
25.0000 ug | INTRAMUSCULAR | Status: DC | PRN
  Filled 2019-03-13: qty 1

## 2019-03-13 MED ORDER — CIPROFLOXACIN IN D5W 400 MG/200ML IV SOLN
INTRAVENOUS | Status: AC
  Filled 2019-03-13: qty 200

## 2019-03-13 MED ORDER — MIDAZOLAM HCL 2 MG/2ML IJ SOLN
INTRAMUSCULAR | Status: DC | PRN
  Administered 2019-03-13: 1 mg via INTRAVENOUS

## 2019-03-13 MED ORDER — LIDOCAINE 2% (20 MG/ML) 5 ML SYRINGE
INTRAMUSCULAR | Status: DC | PRN
  Administered 2019-03-13: 100 mg via INTRAVENOUS

## 2019-03-13 MED ORDER — KETOROLAC TROMETHAMINE 30 MG/ML IJ SOLN
INTRAMUSCULAR | Status: DC | PRN
  Administered 2019-03-13: 30 mg via INTRAVENOUS

## 2019-03-13 MED ORDER — DEXAMETHASONE SODIUM PHOSPHATE 10 MG/ML IJ SOLN
INTRAMUSCULAR | Status: DC | PRN
  Administered 2019-03-13: 5 mg via INTRAVENOUS

## 2019-03-13 MED ORDER — FENTANYL CITRATE (PF) 100 MCG/2ML IJ SOLN
INTRAMUSCULAR | Status: AC
  Filled 2019-03-13: qty 2

## 2019-03-13 MED ORDER — DEXAMETHASONE SODIUM PHOSPHATE 10 MG/ML IJ SOLN
INTRAMUSCULAR | Status: AC
  Filled 2019-03-13: qty 1

## 2019-03-13 MED ORDER — STERILE WATER FOR IRRIGATION IR SOLN
Status: DC | PRN
  Administered 2019-03-13: 1000 mL

## 2019-03-13 MED ORDER — IOPAMIDOL (ISOVUE-370) INJECTION 76%
INTRAVENOUS | Status: DC | PRN
  Administered 2019-03-13: 6 mL

## 2019-03-13 MED ORDER — PHENAZOPYRIDINE HCL 200 MG PO TABS: 200.0000 mg | ORAL_TABLET | Freq: Three times a day (TID) | ORAL | 0 refills | Status: DC | PRN

## 2019-03-13 MED ORDER — ONDANSETRON HCL 4 MG/2ML IJ SOLN
4.0000 mg | Freq: Once | INTRAMUSCULAR | Status: DC | PRN
  Filled 2019-03-13: qty 2

## 2019-03-13 MED ORDER — LIDOCAINE 2% (20 MG/ML) 5 ML SYRINGE
INTRAMUSCULAR | Status: AC
  Filled 2019-03-13: qty 5

## 2019-03-13 MED ORDER — CIPROFLOXACIN IN D5W 400 MG/200ML IV SOLN
400.0000 mg | INTRAVENOUS | Status: AC
  Administered 2019-03-13: 400 mg via INTRAVENOUS
  Filled 2019-03-13: qty 200

## 2019-03-13 MED ORDER — ACETAMINOPHEN 10 MG/ML IV SOLN
1000.0000 mg | Freq: Once | INTRAVENOUS | Status: DC | PRN
  Filled 2019-03-13: qty 100

## 2019-03-13 MED ORDER — ONDANSETRON HCL 4 MG/2ML IJ SOLN
INTRAMUSCULAR | Status: AC
  Filled 2019-03-13: qty 2

## 2019-03-13 MED ORDER — MIDAZOLAM HCL 2 MG/2ML IJ SOLN
INTRAMUSCULAR | Status: AC
  Filled 2019-03-13: qty 2

## 2019-03-13 MED ORDER — SODIUM CHLORIDE 0.9 % IR SOLN
Status: DC | PRN
  Administered 2019-03-13: 6000 mL via INTRAVESICAL

## 2019-03-13 MED ORDER — ONDANSETRON HCL 4 MG/2ML IJ SOLN
INTRAMUSCULAR | Status: DC | PRN
  Administered 2019-03-13: 4 mg via INTRAVENOUS

## 2019-03-13 MED ORDER — KETOROLAC TROMETHAMINE 30 MG/ML IJ SOLN
INTRAMUSCULAR | Status: AC
  Filled 2019-03-13: qty 1

## 2019-03-13 MED ORDER — HYDROCODONE-ACETAMINOPHEN 5-325 MG PO TABS: 2.0000 | ORAL_TABLET | Freq: Four times a day (QID) | ORAL | 0 refills | Status: DC | PRN

## 2019-03-13 SURGICAL SUPPLY — 24 items
BAG DRAIN URO-CYSTO SKYTR STRL (DRAIN) ×2 IMPLANT
BALLN NEPHROSTOMY (BALLOONS) ×2
BALLOON NEPHROSTOMY (BALLOONS) IMPLANT
CATH INTERMIT  6FR 70CM (CATHETERS) IMPLANT
CATH URET 5FR 28IN CONE TIP (BALLOONS)
CATH URET 5FR 70CM CONE TIP (BALLOONS) IMPLANT
CLOTH BEACON ORANGE TIMEOUT ST (SAFETY) ×2 IMPLANT
EVACUATOR MICROVAS BLADDER (UROLOGICAL SUPPLIES) ×1 IMPLANT
FIBER LASER FLEXIVA 1000 (UROLOGICAL SUPPLIES) ×1 IMPLANT
FIBER LASER FLEXIVA 550 (UROLOGICAL SUPPLIES) ×1 IMPLANT
GLOVE BIO SURGEON STRL SZ8 (GLOVE) ×2 IMPLANT
GOWN STRL REUS W/ TWL XL LVL3 (GOWN DISPOSABLE) ×1 IMPLANT
GOWN STRL REUS W/TWL XL LVL3 (GOWN DISPOSABLE) ×2
GOWN XL W/COTTON TOWEL STD (GOWNS) ×2 IMPLANT
GUIDEWIRE ANG ZIPWIRE 038X150 (WIRE) IMPLANT
GUIDEWIRE STR DUAL SENSOR (WIRE) IMPLANT
IV NS IRRIG 3000ML ARTHROMATIC (IV SOLUTION) ×2 IMPLANT
KIT TURNOVER CYSTO (KITS) ×2 IMPLANT
MANIFOLD NEPTUNE II (INSTRUMENTS) ×1 IMPLANT
NS IRRIG 500ML POUR BTL (IV SOLUTION) IMPLANT
PACK CYSTO (CUSTOM PROCEDURE TRAY) ×2 IMPLANT
SYRINGE IRR TOOMEY STRL 70CC (SYRINGE) ×1 IMPLANT
TUBE CONNECTING 12X1/4 (SUCTIONS) ×1 IMPLANT
WATER STERILE IRR 3000ML UROMA (IV SOLUTION) ×1 IMPLANT

## 2019-03-13 NOTE — Anesthesia Procedure Notes (Signed)
Procedure Name: LMA Insertion Date/Time: 03/13/2019 7:27 AM Performed by: Wanita Chamberlain, CRNA Pre-anesthesia Checklist: Patient identified, Emergency Drugs available, Suction available and Patient being monitored Patient Re-evaluated:Patient Re-evaluated prior to induction Preoxygenation: Pre-oxygenation with 100% oxygen Induction Type: IV induction Ventilation: Mask ventilation without difficulty LMA: LMA inserted LMA Size: 4.0 Number of attempts: 1 Placement Confirmation: breath sounds checked- equal and bilateral,  CO2 detector and positive ETCO2 Tube secured with: Tape Dental Injury: Teeth and Oropharynx as per pre-operative assessment

## 2019-03-13 NOTE — Discharge Instructions (Signed)

## 2019-03-13 NOTE — Op Note (Signed)
PATIENT:  Wesley [name]  PRE-OPERATIVE DIAGNOSIS: 1.  Urethral stricture. 2.  Bladder calculus  POST-OPERATIVE DIAGNOSIS: Same  PROCEDURE: 1.  Cystoscopy with balloon dilatation of stricture. 2.  Cystolitholapaxy. 3.  Fluoroscopy time less than 1 hour.  SURGEON:  Claybon Jabs  INDICATION: Wesley [name] is a 68 year old male that began experiencing gross hematuria recently and was evaluated with a CT scan that revealed nonobstructing renal calculi and a bladder stone.  Attempted cystoscopy was undertaken in my office however he was found to have a bulbar urethral stricture.  We therefore discussed management of his stricture with dilation and then treatment of his bladder stone with cystolitholapaxy.  ANESTHESIA:  General  EBL:  Minimal  DRAINS: None  LOCAL MEDICATIONS USED:  None  SPECIMEN: Stone given to patient.  Description of procedure: After informed consent the patient was taken to the operating room and placed on the table in a supine position. General anesthesia was then administered. Once fully anesthetized the patient was moved to the dorsal lithotomy position and the genitalia were sterilely prepped and draped in standard fashion. An official timeout was then performed.  The 59 French cystoscope with 30 degree lens was advanced down the urethra under direct vision.  I noted a moderately severe bulbar urethral stricture.  This was photographed and then a 0.038 inch sensor guidewire was passed through this into the bladder under fluoroscopic control.  I measured the distance to the stricture and then passed a dilating balloon over the guidewire and through the stricture.  Under fluoroscopy I inflated the balloon and noted the area of stricture opening up completely.  I then deflated the balloon, removed the dilating balloon and reinspected the area of stricturing which had opened nicely.  I advanced the cystoscope through the stricture and into the area of the  prostatic urethra which was free of any lesions but was somewhat fixed due to his prior radioactive seed implantation.  The bladder was then entered and I noted 3+ trabeculation with several shallow, widemouth cellules over on the left-hand side primarily.  The right and left UO appeared normal.  The stone was identified within the bladder and photographed.  I then used initially of 500 m and then switching to a 1000 m holmium laser fiber and completely fragmented the stone.  The Microvasive evacuator was then used to evacuate all of the stone fragments from the bladder.  Reinspection of the bladder revealed there was no injury to the bladder mucosa.  Because of the fixed nature of the prostate the rigid scope had caused some slight bleeding at the bladder neck so I used a Bugbee electrode to fulgurate these areas.  I then drained the bladder, removed the cystoscope and the patient was awakened and taken to the recovery room in stable and satisfactory condition.  He tolerated procedure well with no intraoperative complications.  PLAN OF CARE: Discharge to home after PACU  PATIENT DISPOSITION:  PACU - hemodynamically stable.

## 2019-03-13 NOTE — Anesthesia Postprocedure Evaluation (Signed)
Anesthesia Post Note  Patient: Wesley [name]  Procedure(s) Performed: CYSTOSCOPY WITH lASER LITHOLAPAXY, DILATION OF STRICTURE (N/A Bladder)     Patient location during evaluation: PACU Anesthesia Type: General Level of consciousness: awake and alert Pain management: pain level controlled Vital Signs Assessment: post-procedure vital signs reviewed and stable Respiratory status: spontaneous breathing, nonlabored ventilation, respiratory function stable and patient connected to nasal cannula oxygen Cardiovascular status: blood pressure returned to baseline and stable Postop Assessment: no apparent nausea or vomiting Anesthetic complications: no    Last Vitals:  Vitals:   03/13/19 0900 03/13/19 0929  BP: 137/75 (!) 147/77  Pulse: (!) 56 (!) 58  Resp: (!) 9 16  Temp:  36.8 C  SpO2: 94% 97%    Last Pain:  Vitals:   03/13/19 0929  TempSrc:   PainSc: 1                  Barnet Glasgow

## 2019-03-13 NOTE — Transfer of Care (Signed)
Immediate Anesthesia Transfer of Care Note  Patient: Wesley [name]  Procedure(s) Performed: CYSTOSCOPY WITH LITHOLAPAXY/ DILATION OF STRICTURE (N/A )  Patient Location: PACU  Anesthesia Type:General  Level of Consciousness: awake, alert , oriented and patient cooperative  Airway & Oxygen Therapy: Patient Spontanous Breathing and Patient connected to nasal cannula oxygen  Post-op Assessment: Report given to RN and Post -op Vital signs reviewed and stable  Post vital signs: Reviewed and stable  Last Vitals:  Vitals Value Taken Time  BP    Temp    Pulse 60 03/13/2019  8:14 AM  Resp 7 03/13/2019  8:14 AM  SpO2 98 % 03/13/2019  8:14 AM  Vitals shown include unvalidated device data.  Last Pain:  Vitals:   03/13/19 0609  TempSrc:   PainSc: 0-No pain      Patients Stated Pain Goal: 6 (74/93/55 2174)  Complications: No apparent anesthesia complications

## 2019-03-16 ENCOUNTER — Encounter (HOSPITAL_BASED_OUTPATIENT_CLINIC_OR_DEPARTMENT_OTHER): Payer: Self-pay | Admitting: Urology

## 2019-10-09 HISTORY — PX: COLONOSCOPY: SHX174

## 2019-12-07 HISTORY — PX: COLONOSCOPY: SHX174

## 2020-03-09 ENCOUNTER — Other Ambulatory Visit: Payer: Self-pay | Admitting: Urology

## 2020-03-09 DIAGNOSIS — N281 Cyst of kidney, acquired: Secondary | ICD-10-CM

## 2020-04-14 ENCOUNTER — Other Ambulatory Visit: Payer: Self-pay

## 2020-04-14 ENCOUNTER — Ambulatory Visit
Admission: RE | Admit: 2020-04-14 | Discharge: 2020-04-14 | Disposition: A | Discharge status: Home / Self Care | Payer: Medicare Other | Source: Ambulatory Visit | Attending: Urology | Admitting: Urology

## 2020-04-14 DIAGNOSIS — N281 Cyst of kidney, acquired: Secondary | ICD-10-CM

## 2020-04-14 MED ORDER — GADOBENATE DIMEGLUMINE 529 MG/ML IV SOLN
14.0000 mL | Freq: Once | INTRAVENOUS | Status: AC | PRN
  Administered 2020-04-14: 14 mL via INTRAVENOUS

## 2020-08-15 ENCOUNTER — Other Ambulatory Visit: Payer: Self-pay | Admitting: Urology

## 2020-08-17 ENCOUNTER — Other Ambulatory Visit: Payer: Self-pay

## 2020-08-17 ENCOUNTER — Encounter (HOSPITAL_BASED_OUTPATIENT_CLINIC_OR_DEPARTMENT_OTHER): Payer: Self-pay | Admitting: Urology

## 2020-08-17 NOTE — Progress Notes (Addendum)
Spoke w/ via phone for pre-op interview---pt Lab needs dos----    I stat 8           COVID test ------08-20-2020 1200pm Arrive at -------830 am 08-23-2020 NPO after MN NO Solid Food.  Clear liquids from MN until---730 am then npo Medications to take morning of surgery -----keppra Diabetic medication -----n/a Patient Special Instructions -----none Pre-Op special Istructions -----none Patient verbalized understanding of instructions that were given at this phone interview. Patient denies shortness of breath, chest pain, fever, cough at this phone interview.  Anesthesia Review:no  PCP:dr Iran Planas Cardiologist :dr Berneta Levins 07-14-2020 care everywhere/chart Chest x-ray :none EKG :07-14-2020 on chart dr Dominica Severin Mauricio Po Dossie Arbour everywhere Stress echo :2020 normal per dr Mauricio Po 07-14-2020 note: Cardiac Cath : none Neurologist dr Memory Dance Lenward Chancellor 06-28-2020 care everywhere/chart Activity level: climbs  Stairs without problems works full time Sleep Study/ CPAP :n/a Fasting Blood Sugar :      / Checks Blood Sugar -- times a day:  n/a Blood Thinner/ Instructions /Last Dose:n/a ASA / Instructions/ Last Dose : n/a

## 2020-08-20 ENCOUNTER — Other Ambulatory Visit (HOSPITAL_COMMUNITY)
Admission: RE | Admit: 2020-08-20 | Discharge: 2020-08-20 | Disposition: A | Discharge status: Home / Self Care | Payer: Medicare Other | Source: Ambulatory Visit | Attending: Urology | Admitting: Urology

## 2020-08-20 DIAGNOSIS — Z20822 Contact with and (suspected) exposure to covid-19: Secondary | ICD-10-CM | POA: Diagnosis not present

## 2020-08-20 DIAGNOSIS — Z01812 Encounter for preprocedural laboratory examination: Secondary | ICD-10-CM | POA: Insufficient documentation

## 2020-08-20 LAB — SARS CORONAVIRUS 2 (TAT 6-24 HRS): SARS Coronavirus 2: NEGATIVE

## 2020-08-22 ENCOUNTER — Other Ambulatory Visit (HOSPITAL_COMMUNITY): Payer: Medicare Other

## 2020-08-22 NOTE — H&P (Signed)
CC/HPI: cc: follow up for CaP, ED, BPH, low T, renal cysts     08/11/20: 69 year old man with below urologic i problems here for cystoscopy as he is have microscopic hematuria.   Prostate Cancer - patient's PSA on 04/21/2020 is 0.015. He was treated with brachytherapy and external beam.   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 No further surveillance imaging needed.   ED - patient uses sildenafil which helps. He will let me know when he needs a refill.   BPH - patient using tamsulosin and finasteride without difficulty. His urinary symptoms are stable.   Low testosterone - patient uses testosterone replacement and most recent testosterone levels were in the 500s.     ALLERGIES: Sulfa Drugs    MEDICATIONS: Finasteride 5 mg tablet 1 tablet PO Daily  Sildenafil Citrate 20 mg tablet 1-2 tablet PO PRN  Tamsulosin Hcl 0.4 mg capsule 1 capsule PO Q HS  Amlodipine Besylate 5 mg tablet Oral  Celecoxib 100 mg capsule 0 Oral Bedtime  Ciclopirox 0.77 % cream External  Desmopressin Acetate 0.2 mg tablet 4 tablet PO Q HS  Dicyclomine Hcl 20 mg tablet  Doxepin Hcl 50 mg capsule  LevETIRAcetam 500 MG Oral Tablet Oral  Oxtellar Xr 600 mg tablet, extended release 24 hr Oral  Stelara     GU PSH: Cysto Bladder Stone <2.5cm - 03/13/2019 Cysto Bladder Ureth Biopsy - 2013 Cysto Dilate Stricture (M or F) - 03/13/2019 Cystoscopy - 02/18/2019 Cystoscopy Ureteroscopy - 2010 Locm 300-399Mg /Ml Iodine,1Ml - 10/05/2019, 02/11/2019 TRANSPERI NEEDLE PLACE, PROS - 2015       PSH Notes: Inguinal Hernia Repair, Surgery Prostate Transperineal Placement Of Needles, Cystoscopy With Biopsy, Craniotomy Suboccipital Excision Of Meningioma, Cystoscopy With Ureteroscopy Left, Arthroscopy Knee Right   NON-GU PSH: Knee Arthroscopy; Dx - 2009 Remove Brain Lining Lesion - 2011     GU PMH: History of prostate cancer, He has nothing to suggest recurrence with no abnormality on DRE and a PSA that remains  undetectable. - 10/12/2019, (Stable), Although he has a history of prostate cancer treated with radiation I told him I was not particularly concerned about the possibility of this being secondary to prostate cancer but rather more likely secondary to the effects of radiation., - 2020 (Stable), His prostate cancer remains under excellent control with no abnormality noted on DRE and PSA that remains undetectable., - 10/06/2018 (Stable), His erectile dysfunction is likely multifactorial but most likely secondary to the treatment of his prostate cancer with radiation. Also up with me as previously scheduled for his annual prostate cancer follow-up appointment, - 2019 (Stable), At this point it appears his prostate cancer has been well controlled with a PSA that is currently 0.017. I will continue to see him on a yearly basis., - 2018 (Stable), His prostate remains flat and benign to examination. His PSA remains undetectable. I will continue to see him on a yearly basis for DRE and PSA., - 2017, History of prostate cancer, - 2016 Primary hypogonadism, This is managed with AndroGel 3 pumps. He will remain on this. - 10/12/2019, (Stable), We discussed the fact that his serum testosterone level was found to be above the reference range. He felt that that was the day that he used to of the packets so what I have recommended is he decreased the dosage down to 1 packet daily., - 10/06/2018, Hypogonadism, testicular, - 2016 Renal cyst, Right, Has Bosniak 2 F cysts. It was suggested that further characterization may be attained  with an MRI scan so I will schedule him for that in 6 months. - 10/12/2019, Renal cysts, acquired, bilateral, - 2014 Bladder Stone, The source of his gross hematuria is almost certainly the stones located within his bladder on CT scan. I was not able to visualize these due to his stricture. - 02/18/2019 Bulbar urethral stricture, I discovered a moderate bulbar urethral stricture that would not allow passage  of the flexible cystoscope even with pressure and I did not want to cause him any discomfort in the office since I am going to have to take care of his bladder calculi as well so we discussed performing dilation at the time of his cystolitholapaxy. - 02/18/2019 Renal calculus, Right, He has nonobstructing right renal calculi. I do not feel that this in any way contributed to his gross hematuria - 02/18/2019 Gross hematuria, He is experienced gross hematuria and has had previous ionizing radiation and therefore a full evaluation will be undertaken. He has had a history of stones as well so I will obtain a CT scan to evaluate the upper tract and then he will return for completion of his workup with cystoscopy. - 2020 Urinary Urgency (Worsening), He uses DDAVP at night but still is having some nocturia and also is having some urgency and therefore I have recommended a trial of Toviaz to see if he notes this helps any better than the Myrbetriq. 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 - 2019 Urinary Frequency, He is having some new frequency and his nocturia has increased as well. He is using tamsulosin and also Celebrex which resulted in resolution of his nocturia but what I have recommended is a trial of Myrbetriq 25 mg. I have given him samples of this. - 2018, Increased urinary frequency, - 2014 Nocturia, Nocturia - 2016 BPH w/LUTS, Benign prostatic hyperplasia with urinary obstruction - 2015 Unil Inguinal Hernia W/O obst or gang,non-recurrent, Right inguinal hernia - 2015 Elevated PSA, PSA,Elevated - 2014 Peyronies Disease, Peyronie's disease - 2014 Ureteral calculus, Ureteral Stone - 2014 Urinary Retention, Unspec, Incomplete bladder emptying - 2014      PMH Notes: Prostate cancer:  Date of diagnosis: 10/29/08  Pretreatment PSA: 5.2  Gleason score: 4+3=7, 5/12 cores positive with only one core positive for  Gleason 4+3.  Stage - T1c  Treatment: External Beam Radiation 3-4/10 / Brachytherapy 03/04/09   Nephrolithiasis: In 7/10 he underwent a CT scan because of abdominal complaints and was found to have a 3 mm distal right ureteral calculus but no renal calculi were identified. He subsequently passed right ureteral stone. In 10/10 he underwent left ureteroscopy for what appeared to be a stone overlying the left ureter without progression. The stone was located over the sacral region however at the time of his retrograde pyelogram and ureteroscopy no stone could be identified within the ureter.  CT scan in the 5/20 revealed a right renal calculi with no left renal stones noted.   Nocturia: He had noted nocturia as well as some daytime frequency and had been treated with a 5 alpha reductase inhibitor as well as an alpha-blocker but remains symptomatic. I tried DDAVP however this resulted in worsening of his hesitancy he did not want to remain on this therapy. I had prescribed anticholinergics in the past but he was reluctant to reinitiate this form of therapy because of his history of ulcerative colitis but once that was under control  he elected to undergo a trial of VESIcare at a 10 mg dose which was ineffective. I recommended DDAVP which was started in 5/13.   Hypogonadism: He was found to have hypogonadism with a low free testosterone level of 5.5 in 2/11. Initially I recommended he not undergo testosterone replacement since he had just been diagnosed with prostate cancer. Now that he has shown a significant fall and stabilization of his PSA I therefore initiated testosterone replacement therapy in 5/13.  Current therapy: AndroGel 4 pumps q.a.m.   BPH with outlet obstruction: He developed hematuria with clot retention and eventually voided without difficulty but I placed him on finasteride (5/13) in order to prevent this from occurring in the future.  Current therapy: Tamsulosin and finasteride.   Gross  hematuria: He initially experienced gross hematuria in 2/20. A CT scan in 5/20 revealed no abnormality of the upper tract and bladder calculi. Cystoscopy revealed a bulbar urethral stricture.   Bulbar urethral stricture: This was discovered at the time of cystoscopy and treated with balloon dilatation on 03/13/19.   Bilateral renal cysts: He was found have bilateral renal cysts by CT scan in 5/20. There were simple with a Bosniak 11F cyst on the right.   Bladder calculi: He was found to have bladder calculi in underwent cystolitholapaxy on 03/13/19.     NON-GU PMH: Encounter for general adult medical examination without abnormal findings, Encounter for preventive health examination - 01-15-14 Personal history of other diseases of the circulatory system, History of hypertension - 15-Jan-2013 Personal history of other endocrine, nutritional and metabolic disease, History of hypercholesterolemia - January 15, 2013 Personal history of other specified conditions, History of heartburn - Jan 15, 2013    FAMILY HISTORY: Death In The Family Father - Father Death In The Family Mother - Mother Family Health Status Number - Runs In Family   SOCIAL HISTORY: Marital Status: Married Preferred Language: English; Ethnicity: Not Hispanic Or Latino; Race: White Current Smoking Status: Patient does not smoke anymore.   Tobacco Use Assessment Completed: Used Tobacco in last 30 days? Does drink.  Drinks 3 caffeinated drinks per day.     Notes: Former smoker, Caffeine Use, Marital History - Currently Married, Alcohol Use, Tobacco Use, Occupation:   REVIEW OF SYSTEMS:    GU Review Male:   Patient denies frequent urination, hard to postpone urination, burning/ pain with urination, get up at night to urinate, leakage of urine, stream starts and stops, trouble starting your stream, have to strain to urinate , erection problems, and penile pain.  Gastrointestinal (Upper):   Patient denies nausea, vomiting, and indigestion/ heartburn.   Gastrointestinal (Lower):   Patient denies diarrhea and constipation.  Constitutional:   Patient denies fever, night sweats, weight loss, and fatigue.  Skin:   Patient denies skin rash/ lesion and itching.  Eyes:   Patient denies blurred vision and double vision.  Ears/ Nose/ Throat:   Patient denies sore throat and sinus problems.  Hematologic/Lymphatic:   Patient denies swollen glands and easy bruising.  Cardiovascular:   Patient denies leg swelling and chest pains.  Respiratory:   Patient denies cough and shortness of breath.  Endocrine:   Patient denies excessive thirst.  Musculoskeletal:   Patient denies back pain and joint pain.  Neurological:   Patient denies headaches and dizziness.  Psychologic:   Patient denies depression and anxiety.   VITAL SIGNS: None   MULTI-SYSTEM PHYSICAL EXAMINATION:    Constitutional: Well-nourished. No physical deformities. Normally developed. Good grooming.  Neck: Neck symmetrical, not swollen.  Normal tracheal position.  Respiratory: No labored breathing, no use of accessory muscles.   Skin: No paleness, no jaundice, no cyanosis. No lesion, no ulcer, no rash.  Neurologic / Psychiatric: Oriented to time, oriented to place, oriented to person. No depression, no anxiety, no agitation.  Eyes: Normal conjunctivae. Normal eyelids.  Ears, Nose, Mouth, and Throat: Left ear no scars, no lesions, no masses. Right ear no scars, no lesions, no masses. Nose no scars, no lesions, no masses. Normal hearing. Normal lips.  Musculoskeletal: Normal gait and station of head and neck.     Complexity of Data:   04/21/20 09/29/19 09/23/18 09/13/17 05/30/17 09/18/16 09/14/15 09/17/14  PSA  Total PSA <0.015 ng/mL <0.015 ng/mL <0.015 ng/mL 0.017 ng/mL <0.015 ng/dL < 0.015  <0.01  <0.01     04/21/20 09/25/19 09/23/18 05/30/17 04/05/14 03/03/14 08/21/13 08/19/12  Hormones  Testosterone, Total 595.4 ng/dL 705.9 ng/dL 1350.7 ng/dL 543.9 ng/dL 467  273  280  478.95      PROCEDURES:         Flexible Cystoscopy - 52000  Risks, benefits, and some of the potential complications of the procedure were discussed at length with the patient including infection, bleeding, voiding discomfort, urinary retention, fever, chills, sepsis, and others. All questions were answered. Informed consent was obtained. Sterile technique and intraurethral analgesia were used.  Meatus:  Normal size. Normal location. Normal condition.  Urethra:  Bulbar urethral stricture present with calculi seen proximal. Unable to the traversed stricture was scope.      The flexible cystoscope was placed in the urethral meatus and advanced and till the stricture was encountered. At this point in time, a stricture was seen with stones proximal to this area. The camera could not traverse the scope. This is scope was removed without difficulty. The procedure was well-tolerated and without complications. Instructions were given to call the office immediately for bloody urine, difficulty urinating, urinary retention, painful or frequent urination, fever, chills, nausea, vomiting or other illness. The patient stated that he understood these instructions and would comply with them.        PVR Ultrasound - [id number]  Scanned Volume: 148 cc         Urinalysis w/Scope Dipstick Dipstick Cont'd Micro  Color: Yellow Bilirubin: Neg mg/dL WBC/hpf: 0 - 5/hpf  Appearance: Slightly Cloudy Ketones: Neg mg/dL RBC/hpf: 20 - 40/hpf  Specific Gravity: 1.025 Blood: 3+ ery/uL Bacteria: Rare (0-9/hpf)  pH: 6.5 Protein: Trace mg/dL Cystals: NS (Not Seen)  Glucose: Neg mg/dL Urobilinogen: 0.2 mg/dL Casts: NS (Not Seen)    Nitrites: Neg Trichomonas: Not Present    Leukocyte Esterase: Trace leu/uL Mucous: Not Present      Epithelial Cells: 0 - 5/hpf      Yeast: NS (Not Seen)      Sperm: Not Present    ASSESSMENT:      ICD-10 Details  1 GU:   Bulbar urethral stricture - N35.011 Chronic, Worsening - Based on cystoscopy today  would like to proceed with cystoscopy, balloon dilation of urethral stricture, and laser lithotripsy of stones. I discussed the risks and benefits of the procedure including but not limited to pain, bleeding, recurrence of stricture, damage to surrounding structures including urethra and bladder, need for Foley catheter or additional procedures after this. Patient understands and wishes to proceed.  2   Calculus of LUT, Unspec - F63.8 Acute, Uncomplicated

## 2020-08-23 ENCOUNTER — Other Ambulatory Visit: Payer: Self-pay

## 2020-08-23 ENCOUNTER — Ambulatory Visit (HOSPITAL_BASED_OUTPATIENT_CLINIC_OR_DEPARTMENT_OTHER): Payer: Medicare Other | Admitting: Anesthesiology

## 2020-08-23 ENCOUNTER — Encounter (HOSPITAL_BASED_OUTPATIENT_CLINIC_OR_DEPARTMENT_OTHER): Payer: Self-pay | Admitting: Urology

## 2020-08-23 ENCOUNTER — Ambulatory Visit (HOSPITAL_BASED_OUTPATIENT_CLINIC_OR_DEPARTMENT_OTHER)
Admission: RE | Admit: 2020-08-23 | Discharge: 2020-08-23 | Disposition: A | Discharge status: Home / Self Care | Payer: Medicare Other | Attending: Urology | Admitting: Urology

## 2020-08-23 ENCOUNTER — Encounter (HOSPITAL_BASED_OUTPATIENT_CLINIC_OR_DEPARTMENT_OTHER)
Admission: RE | Disposition: A | Discharge status: Home / Self Care | Payer: Self-pay | Source: Home / Self Care | Attending: Urology

## 2020-08-23 DIAGNOSIS — Z87891 Personal history of nicotine dependence: Secondary | ICD-10-CM | POA: Insufficient documentation

## 2020-08-23 DIAGNOSIS — N210 Calculus in bladder: Secondary | ICD-10-CM | POA: Insufficient documentation

## 2020-08-23 DIAGNOSIS — N281 Cyst of kidney, acquired: Secondary | ICD-10-CM | POA: Diagnosis not present

## 2020-08-23 DIAGNOSIS — N400 Enlarged prostate without lower urinary tract symptoms: Secondary | ICD-10-CM | POA: Insufficient documentation

## 2020-08-23 DIAGNOSIS — Z8546 Personal history of malignant neoplasm of prostate: Secondary | ICD-10-CM | POA: Insufficient documentation

## 2020-08-23 DIAGNOSIS — Z882 Allergy status to sulfonamides status: Secondary | ICD-10-CM | POA: Diagnosis not present

## 2020-08-23 DIAGNOSIS — R3129 Other microscopic hematuria: Secondary | ICD-10-CM | POA: Insufficient documentation

## 2020-08-23 DIAGNOSIS — N35912 Unspecified bulbous urethral stricture, male: Secondary | ICD-10-CM | POA: Diagnosis not present

## 2020-08-23 DIAGNOSIS — N211 Calculus in urethra: Secondary | ICD-10-CM | POA: Diagnosis present

## 2020-08-23 DIAGNOSIS — N3289 Other specified disorders of bladder: Secondary | ICD-10-CM | POA: Insufficient documentation

## 2020-08-23 HISTORY — DX: Personal history of urinary calculi: Z87.442

## 2020-08-23 HISTORY — DX: Abnormal electrocardiogram (ECG) (EKG): R94.31

## 2020-08-23 HISTORY — DX: Migraine, unspecified, not intractable, without status migrainosus: G43.909

## 2020-08-23 HISTORY — PX: CYSTOSCOPY WITH URETHRAL DILATATION: SHX5125

## 2020-08-23 HISTORY — DX: Ulcerative colitis, unspecified, without complications: K51.90

## 2020-08-23 HISTORY — PX: HOLMIUM LASER APPLICATION: SHX5852

## 2020-08-23 HISTORY — DX: Angina pectoris, unspecified: I20.9

## 2020-08-23 LAB — POCT I-STAT, CHEM 8
BUN: 31 mg/dL — ABNORMAL HIGH (ref 8–23)
Calcium, Ion: 1.3 mmol/L (ref 1.15–1.40)
Chloride: 103 mmol/L (ref 98–111)
Creatinine, Ser: 1 mg/dL (ref 0.61–1.24)
Glucose, Bld: 113 mg/dL — ABNORMAL HIGH (ref 70–99)
HCT: 48 % (ref 39.0–52.0)
Hemoglobin: 16.3 g/dL (ref 13.0–17.0)
Potassium: 3.7 mmol/L (ref 3.5–5.1)
Sodium: 142 mmol/L (ref 135–145)
TCO2: 27 mmol/L (ref 22–32)

## 2020-08-23 SURGERY — CYSTOSCOPY, WITH URETHRAL DILATION
Anesthesia: General | Site: Pelvis

## 2020-08-23 MED ORDER — LACTATED RINGERS IV SOLN: INTRAVENOUS | Status: DC

## 2020-08-23 MED ORDER — HYDROMORPHONE HCL 1 MG/ML IJ SOLN
0.2500 mg | INTRAMUSCULAR | Status: DC | PRN
  Administered 2020-08-23: 0.5 mg via INTRAVENOUS

## 2020-08-23 MED ORDER — SODIUM CHLORIDE 0.9 % IR SOLN
Status: DC | PRN
  Administered 2020-08-23: 6000 mL

## 2020-08-23 MED ORDER — FENTANYL CITRATE (PF) 100 MCG/2ML IJ SOLN
INTRAMUSCULAR | Status: DC | PRN
  Administered 2020-08-23: 25 ug via INTRAVENOUS
  Administered 2020-08-23 (×2): 50 ug via INTRAVENOUS
  Administered 2020-08-23: 25 ug via INTRAVENOUS
  Administered 2020-08-23: 50 ug via INTRAVENOUS

## 2020-08-23 MED ORDER — SODIUM CHLORIDE 0.9 % IV SOLN
2.0000 g | INTRAVENOUS | Status: AC
  Administered 2020-08-23: 2 g via INTRAVENOUS

## 2020-08-23 MED ORDER — CEFTRIAXONE SODIUM 2 G IJ SOLR
INTRAMUSCULAR | Status: AC
  Filled 2020-08-23: qty 20

## 2020-08-23 MED ORDER — LIDOCAINE 2% (20 MG/ML) 5 ML SYRINGE
INTRAMUSCULAR | Status: DC | PRN
  Administered 2020-08-23: 60 mg via INTRAVENOUS

## 2020-08-23 MED ORDER — DEXAMETHASONE SODIUM PHOSPHATE 10 MG/ML IJ SOLN
INTRAMUSCULAR | Status: DC | PRN
  Administered 2020-08-23: 5 mg via INTRAVENOUS

## 2020-08-23 MED ORDER — PROPOFOL 10 MG/ML IV BOLUS
INTRAVENOUS | Status: AC
  Filled 2020-08-23: qty 20

## 2020-08-23 MED ORDER — ACETAMINOPHEN 10 MG/ML IV SOLN: 1000.0000 mg | Freq: Once | INTRAVENOUS | Status: DC | PRN

## 2020-08-23 MED ORDER — ONDANSETRON HCL 4 MG/2ML IJ SOLN
INTRAMUSCULAR | Status: DC | PRN
  Administered 2020-08-23: 4 mg via INTRAVENOUS

## 2020-08-23 MED ORDER — FENTANYL CITRATE (PF) 100 MCG/2ML IJ SOLN
INTRAMUSCULAR | Status: AC
  Filled 2020-08-23: qty 2

## 2020-08-23 MED ORDER — ONDANSETRON HCL 4 MG/2ML IJ SOLN: 4.0000 mg | Freq: Once | INTRAMUSCULAR | Status: DC | PRN

## 2020-08-23 MED ORDER — KETOROLAC TROMETHAMINE 30 MG/ML IJ SOLN
INTRAMUSCULAR | Status: DC | PRN
  Administered 2020-08-23: 30 mg via INTRAVENOUS

## 2020-08-23 MED ORDER — STERILE WATER FOR IRRIGATION IR SOLN
Status: DC | PRN
  Administered 2020-08-23: 500 mL

## 2020-08-23 MED ORDER — ONDANSETRON HCL 4 MG/2ML IJ SOLN
INTRAMUSCULAR | Status: AC
  Filled 2020-08-23: qty 2

## 2020-08-23 MED ORDER — LIDOCAINE 2% (20 MG/ML) 5 ML SYRINGE
INTRAMUSCULAR | Status: AC
  Filled 2020-08-23: qty 5

## 2020-08-23 MED ORDER — HYDROCODONE-ACETAMINOPHEN 5-325 MG PO TABS: 1.0000 | ORAL_TABLET | ORAL | 0 refills | Status: DC | PRN

## 2020-08-23 MED ORDER — KETOROLAC TROMETHAMINE 30 MG/ML IJ SOLN
INTRAMUSCULAR | Status: AC
  Filled 2020-08-23: qty 1

## 2020-08-23 MED ORDER — SODIUM CHLORIDE 0.9 % IV SOLN: INTRAVENOUS | Status: DC

## 2020-08-23 MED ORDER — SODIUM CHLORIDE 0.9 % IV SOLN
INTRAVENOUS | Status: AC
  Filled 2020-08-23: qty 100

## 2020-08-23 MED ORDER — HYDROMORPHONE HCL 1 MG/ML IJ SOLN
INTRAMUSCULAR | Status: AC
  Filled 2020-08-23: qty 1

## 2020-08-23 MED ORDER — DEXAMETHASONE SODIUM PHOSPHATE 10 MG/ML IJ SOLN
INTRAMUSCULAR | Status: AC
  Filled 2020-08-23: qty 1

## 2020-08-23 MED ORDER — PROPOFOL 10 MG/ML IV BOLUS
INTRAVENOUS | Status: DC | PRN
  Administered 2020-08-23: 150 mg via INTRAVENOUS

## 2020-08-23 SURGICAL SUPPLY — 31 items
BAG DRAIN URO-CYSTO SKYTR STRL (DRAIN) ×3 IMPLANT
BAG DRN RND TRDRP ANRFLXCHMBR (UROLOGICAL SUPPLIES) ×1
BAG DRN UROCATH (DRAIN) ×1
BAG URINE DRAIN 2000ML AR STRL (UROLOGICAL SUPPLIES) ×3 IMPLANT
BALLN NEPHROSTOMY (BALLOONS) ×3
BALLOON NEPHROSTOMY (BALLOONS) ×1 IMPLANT
BASKET ZERO TIP NITINOL 2.4FR (BASKET) IMPLANT
BSKT STON RTRVL ZERO TP 2.4FR (BASKET)
CATH COUDE FOLEY 2W 5CC 22FR (CATHETERS) IMPLANT
CATH HEMA 3WAY 30CC 22FR COUDE (CATHETERS) ×3 IMPLANT
CATH URET 5FR 28IN OPEN ENDED (CATHETERS) ×3 IMPLANT
CLOTH BEACON ORANGE TIMEOUT ST (SAFETY) ×3 IMPLANT
DRSG TEGADERM 4X4.75 (GAUZE/BANDAGES/DRESSINGS) IMPLANT
EXTRACTOR STONE 1.7FRX115CM (UROLOGICAL SUPPLIES) IMPLANT
FIBER LASER FLEXIVA 365 (UROLOGICAL SUPPLIES) ×3 IMPLANT
FIBER LASER TRAC TIP (UROLOGICAL SUPPLIES) IMPLANT
GLOVE BIO SURGEON STRL SZ 6.5 (GLOVE) ×2 IMPLANT
GLOVE BIO SURGEONS STRL SZ 6.5 (GLOVE) ×1
GOWN STRL REUS W/TWL LRG LVL3 (GOWN DISPOSABLE) ×3 IMPLANT
GUIDEWIRE STR DUAL SENSOR (WIRE) ×3 IMPLANT
IV NS IRRIG 3000ML ARTHROMATIC (IV SOLUTION) ×9 IMPLANT
KIT TURNOVER CYSTO (KITS) ×3 IMPLANT
LOOP CUT BIPOLAR 24F LRG (ELECTROSURGICAL) ×3 IMPLANT
MANIFOLD NEPTUNE II (INSTRUMENTS) ×3 IMPLANT
NS IRRIG 500ML POUR BTL (IV SOLUTION) ×3 IMPLANT
PACK CYSTO (CUSTOM PROCEDURE TRAY) ×3 IMPLANT
TUBE CONNECTING 12'X1/4 (SUCTIONS) ×1
TUBE CONNECTING 12X1/4 (SUCTIONS) ×2 IMPLANT
TUBING UROLOGY SET (TUBING) ×3 IMPLANT
WATER STERILE IRR 3000ML UROMA (IV SOLUTION) ×3 IMPLANT
WATER STERILE IRR 500ML POUR (IV SOLUTION) ×3 IMPLANT

## 2020-08-23 NOTE — Interval H&P Note (Signed)
History and Physical Interval Note:  08/23/2020 9:08 AM  Wesley [name]  has presented today for surgery, with the diagnosis of URETHRAL STONE, URETHRAL STRICTURE.  The various methods of treatment have been discussed with the patient and family. After consideration of risks, benefits and other options for treatment, the patient has consented to  Procedure(s) with comments: CYSTOSCOPY WITH URETHRAL DILATATION (N/A) - 1 HR HOLMIUM LASER APPLICATION OF URETHRAL STONE (N/A) as a surgical intervention.  The patient's history has been reviewed, patient examined, no change in status, stable for surgery.  I have reviewed the patient's chart and labs.  Questions were answered to the patient's satisfaction.     Rashaun Curl D Rox Mcgriff

## 2020-08-23 NOTE — Discharge Instructions (Signed)
Cystoscopy patient instructions  Following a cystoscopy, a catheter (a flexible rubber tube) is sometimes left in place to empty the bladder. This may cause some discomfort or a feeling that you need to urinate. Your doctor determines the period of time that the catheter will be left in place. You may have bloody urine for two to three days (Call your doctor if the amount of bleeding increases or does not subside).  You may pass blood clots in your urine, especially if you had a biopsy. It is not unusual to pass small blood clots and have some bloody urine a couple of weeks after your cystoscopy. Again, call your doctor if the bleeding does not subside. You may have: Dysuria (painful urination) Frequency (urinating often) Urgency (strong desire to urinate)  These symptoms are common especially if medicine is instilled into the bladder or a ureteral stent is placed. Avoiding alcohol and caffeine, such as coffee, tea, and chocolate, may help relieve these symptoms. Drink plenty of water, unless otherwise instructed. Your doctor may also prescribe an antibiotic or other medicine to reduce these symptoms.  Cystoscopy results are available soon after the procedure; biopsy results usually take two to four days. Your doctor will discuss the results of your exam with you. Before you go home, you will be given specific instructions for follow-up care. Special Instructions:  1 If you are going home with a catheter in place do not take a tub bath until removed by your doctor.  2 You may resume your normal activities.  3 Do not drive or operate machinery if you are taking narcotic pain medicine.  4 Be sure to keep all follow-up appointments with your doctor.   5 Call Your Doctor If: The catheter is not draining  You have severe pain  You are unable to urinate  You have a fever over 101  You have severe bleeding           Post Anesthesia Home Care Instructions  Activity: Get plenty of rest for  the remainder of the day. A responsible individual must stay with you for 24 hours following the procedure.  For the next 24 hours, DO NOT: -Drive a car -Operate machinery -Drink alcoholic beverages -Take any medication unless instructed by your physician -Make any legal decisions or sign important papers.  Meals: Start with liquid foods such as gelatin or soup. Progress to regular foods as tolerated. Avoid greasy, spicy, heavy foods. If nausea and/or vomiting occur, drink only clear liquids until the nausea and/or vomiting subsides. Call your physician if vomiting continues.  Special Instructions/Symptoms: Your throat may feel dry or sore from the anesthesia or the breathing tube placed in your throat during surgery. If this causes discomfort, gargle with warm salt water. The discomfort should disappear within 24 hours.        

## 2020-08-23 NOTE — Anesthesia Procedure Notes (Signed)
Procedure Name: LMA Insertion Date/Time: 08/23/2020 10:20 AM Performed by: Suan Halter, CRNA Pre-anesthesia Checklist: Patient identified, Emergency Drugs available, Suction available and Patient being monitored Patient Re-evaluated:Patient Re-evaluated prior to induction Oxygen Delivery Method: Circle system utilized Preoxygenation: Pre-oxygenation with 100% oxygen Induction Type: IV induction Ventilation: Mask ventilation without difficulty LMA: LMA inserted LMA Size: 4.0 Number of attempts: 1 Airway Equipment and Method: Bite block Placement Confirmation: positive ETCO2 Tube secured with: Tape Dental Injury: Teeth and Oropharynx as per pre-operative assessment

## 2020-08-23 NOTE — Op Note (Addendum)
PATIENT:  Wesley [name]  PRE-OPERATIVE DIAGNOSIS: urethral stricture, urethral calculus  POST-OPERATIVE DIAGNOSIS: Urethral stricture, urethral calculus, bladder neoplasm unspecified  PROCEDURE:   1. Cystoscopy with laser lithotripsy of urethral calculus 2.  Urethral stricture balloon dilation 3. Bladder biopsy and fulguration of bladder neoplasm 1.5 - 2 cm  SURGEON:  Jacalyn Lefevre, MD  ANESTHESIA:   General  EBL: minimal  DRAINS: Urethral catheter (22 Fr. Hematuria Foley)   SPECIMEN:   1. Bladder tumor 2. Urethral calculi (taken to [facility])  DISPOSITION OF SPECIMEN:  PATHOLOGY  Indication: 69 year old man initially presented with microscopic hematuria found to have a urethral stricture and urethral calculus on office cystoscopy.  Cystoscope could not pass stricture or stone and he now presents for laser lithotripsy of stone and balloon dilation.    Description of operation: The patient was taken to the operating room and administered general anesthesia. They were then placed on the table and moved to the dorsal lithotomy position after which the genitalia was sterilely prepped and draped. An official timeout was then performed.  The 23 Fr cystoscope was placed in the urethral meatus and advanced until a stricture was seen in the bulbar urethra.  The scope could not traverse the stricture.  A urethral calculus was seen just proximal to the stricture.  Attempts at passing a wire were not successful.  The cystoscope was removed and semi-rigid ureteroscope was advanced until the stone was seen.  Laser lithotripsy then began with 365 micron laser fiber until the stone was fragmented.  A wire was then easily passed into the bladder.  The ureteroscope was removed.  Next, the ultraxx balloon dilator was placed over the wire and inflated to 18 mm Hg with contrast to dilate the stricture.  The balloon was deflated and removed after 1 minute.  The 23 Fr rigid cystoscope was then  able to traverse the urethra and enter bladder.  At this point, the bladder neck was wide open but stiff and made movement of scope difficult.  Papillary growth concerning for bladder cancer was seen on left anterior bladder wall.  The decision was made to biopsy this.  Bilateral UOs were seen and uninvolved.   The 73 French resectoscope with the 30 lens and visual obturator were then passed into the bladder under direct visualization. The visual obturator was then removed and the Gyrus resectoscope element with 30  lens was then inserted and the bladder was fully and systematically inspected. Ureteral orifices were noted to be in the normal anatomic positions.   I first began by the papillary tissue of left lateral anterior wall.  The the bipolar loop was used to cauterize the remaining growth that was easily pushed off the urothelium.  Hemostasis was adequate with irrigant turned off.  A 22Fr hematuria 3 way foley was placed.  Patient emerged from anesthesia and was transferred to PACU in stable condition.     PLAN OF CARE: Discharge to home after PACU  PATIENT DISPOSITION:  PACU - hemodynamically stable.

## 2020-08-23 NOTE — Anesthesia Preprocedure Evaluation (Signed)
Anesthesia Evaluation  Patient identified by MRN, date of birth, ID band Patient awake    Reviewed: Patient's Chart, lab work & pertinent test results  Airway Mallampati: II  TM Distance: >3 FB Neck ROM: Full    Dental  (+) Teeth Intact   Pulmonary former smoker,    Pulmonary exam normal        Cardiovascular hypertension, Pt. on medications  Rhythm:Regular Rate:Normal     Neuro/Psych  Headaches, Seizures -, Well Controlled,     GI/Hepatic hiatal hernia, PUD, UC   Endo/Other  negative endocrine ROS  Renal/GU Renal diseasestones   Prostate Ca 2010, renal stones    Musculoskeletal   Abdominal (+)  Abdomen: soft. Bowel sounds: normal.  Peds  Hematology negative hematology ROS (+)   Anesthesia Other Findings   Reproductive/Obstetrics                             Anesthesia Physical Anesthesia Plan  ASA: III  Anesthesia Plan: General   Post-op Pain Management:    Induction: Intravenous  PONV Risk Score and Plan: 2 and Ondansetron, Dexamethasone and Treatment may vary due to age or medical condition  Airway Management Planned: Mask and LMA  Additional Equipment: None  Intra-op Plan:   Post-operative Plan: Extubation in OR  Informed Consent: I have reviewed the patients History and Physical, chart, labs and discussed the procedure including the risks, benefits and alternatives for the proposed anesthesia with the patient or authorized representative who has indicated his/her understanding and acceptance.     Dental advisory given  Plan Discussed with: CRNA  Anesthesia Plan Comments: (Lab Results      Component                Value               Date                      WBC                      6.3                 03/05/2012                HGB                      16.3                08/23/2020                HCT                      48.0                08/23/2020                 MCV                      90.4                03/05/2012                PLT                      281  03/05/2012          )        Anesthesia Quick Evaluation

## 2020-08-23 NOTE — Anesthesia Postprocedure Evaluation (Signed)
Anesthesia Post Note  Patient: Wesley [name]  Procedure(s) Performed: CYSTOSCOPY WITH URETHRAL DILATATION, BLADDER BIOPSY, FULGERATION (N/A Pelvis) HOLMIUM LASER APPLICATION OF URETHRAL STONE (N/A Pelvis)     Patient location during evaluation: PACU Anesthesia Type: General Level of consciousness: awake and alert Pain management: pain level controlled Vital Signs Assessment: post-procedure vital signs reviewed and stable Respiratory status: spontaneous breathing, nonlabored ventilation, respiratory function stable and patient connected to nasal cannula oxygen Cardiovascular status: blood pressure returned to baseline and stable Postop Assessment: no apparent nausea or vomiting Anesthetic complications: no   No complications documented.  Last Vitals:  Vitals:   08/23/20 1215 08/23/20 1306  BP: (!) 162/87 (!) 158/78  Pulse: 72 71  Resp: (!) 21 20  Temp:  36.8 C  SpO2: 96% 97%    Last Pain:  Vitals:   08/23/20 1306  TempSrc:   PainSc: 0-No pain                 Belenda Cruise P Cathaleen Korol

## 2020-08-23 NOTE — Transfer of Care (Signed)
Immediate Anesthesia Transfer of Care Note  Patient: Wesley [name]  Procedure(s) Performed: Procedure(s) (LRB): CYSTOSCOPY WITH URETHRAL DILATATION, BLADDER BIOPSY (N/A) HOLMIUM LASER APPLICATION OF URETHRAL STONE (N/A)  Patient Location: PACU  Anesthesia Type: General  Level of Consciousness: awake, oriented, sedated and patient cooperative  Airway & Oxygen Therapy: Patient Spontanous Breathing and Patient connected to face mask oxygen  Post-op Assessment: Report given to PACU RN and Post -op Vital signs reviewed and stable  Post vital signs: Reviewed and stable  Complications: No apparent anesthesia complications  Last Vitals:  Vitals Value Taken Time  BP 173/88 08/23/20 1122  Temp 36.5 C 08/23/20 1122  Pulse 81 08/23/20 1125  Resp 12 08/23/20 1125  SpO2 99 % 08/23/20 1125  Vitals shown include unvalidated device data.  Last Pain:  Vitals:   08/23/20 0829  TempSrc: Oral  PainSc: 0-No pain      Patients Stated Pain Goal: 7 (50/35/46 [id number])  Complications: No complications documented.

## 2020-08-24 ENCOUNTER — Encounter (HOSPITAL_BASED_OUTPATIENT_CLINIC_OR_DEPARTMENT_OTHER): Payer: Self-pay | Admitting: Urology

## 2020-08-25 LAB — SURGICAL PATHOLOGY

## 2020-10-02 IMAGING — MR MR ABDOMEN WO/W CM
11 of 17 series · 24 of 48 positions shown · IV contrast (multihance)
Comparison: No prior abdominal MRI.  CT the abdomen 10/05/2019.

CLINICAL DATA: 68-year-old male with history of intermittent
hematuria. History of kidney stones.

EXAM:
MRI ABDOMEN WITHOUT AND WITH CONTRAST
TECHNIQUE: Multiplanar multisequence MR imaging of the abdomen was performed
both before and after the administration of intravenous contrast.
CONTRAST:  14mL MULTIHANCE GADOBENATE DIMEGLUMINE 529 MG/ML IV SOLN

[Series 2: T2 · coronal · 5.0mm · 1.45mm/px · 1 of 31 slices shown (1 of 3)]
[im 1/31]
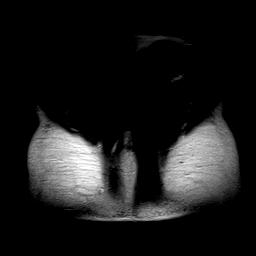

[Series 3: T2 · axial · 5.0mm · 1.41mm/px · 1 of 40 slices shown (2 of 3)]
[im 1/40]
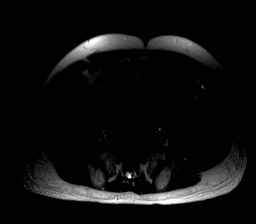

[Series 4: axial in out · axial · 5.5mm · 0.70mm/px · z∈[-108,+138]mm · 2 of 80 slices shown]
[im 1/80]
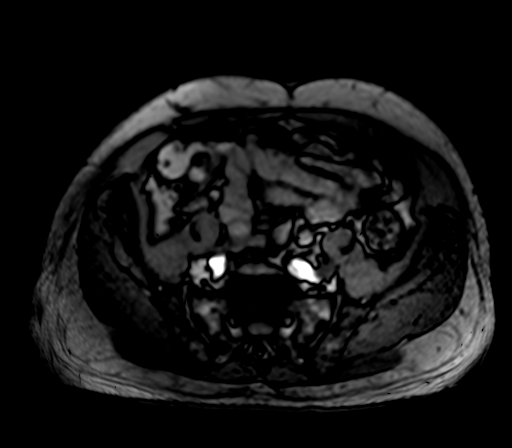
[im 80/80]
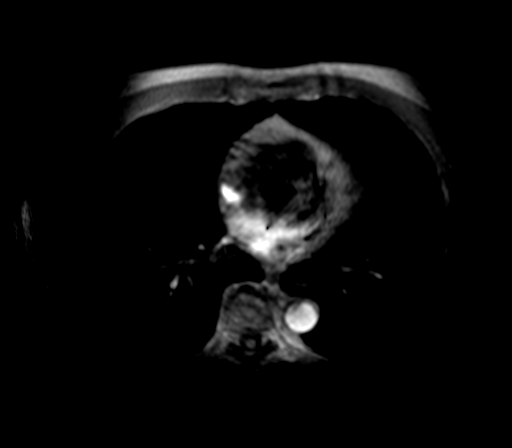

[Series 5: axial tru fisp · axial · 5.0mm · 1.41mm/px · 1 of 42 slices shown]
[im 1/42]
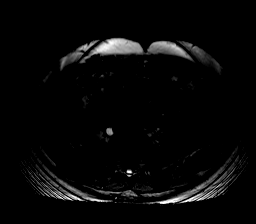

[Series 6: T2 · axial · 5.0mm · 0.70mm/px · 1 of 45 slices shown (3 of 3)]
[im 1/45]
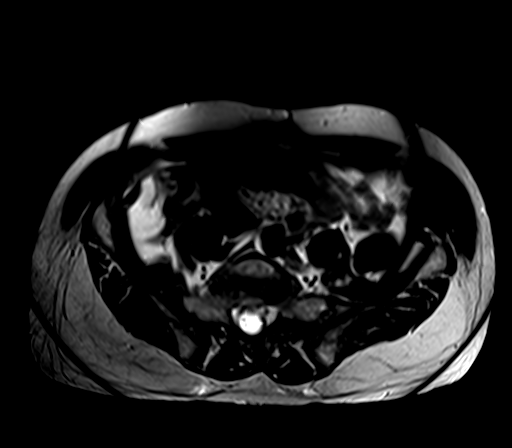

[Series 7: ep2d_diff_b50_500_800_p2_trig · axial · 5.0mm · 1.88mm/px · z∈[-88,+186]mm · 3 of 135 slices shown]
[im 1/135]
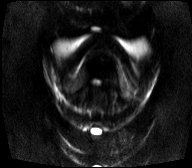
[im 68/135]
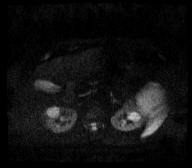
[im 135/135]
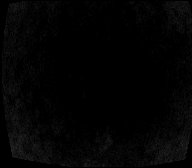

[Series 8: [id number]_500_800_p2_trig_adc · axial · 5.0mm · 1.88mm/px · 1 of 45 slices shown]
[im 1/45]
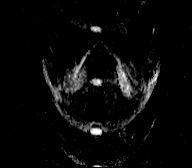

[Series 9: T1 dynamic · axial · non-contrast · 2.0mm · 0.78mm/px · z∈[-96,+126]mm · 3 of 112 slices shown]
[im 1/112]
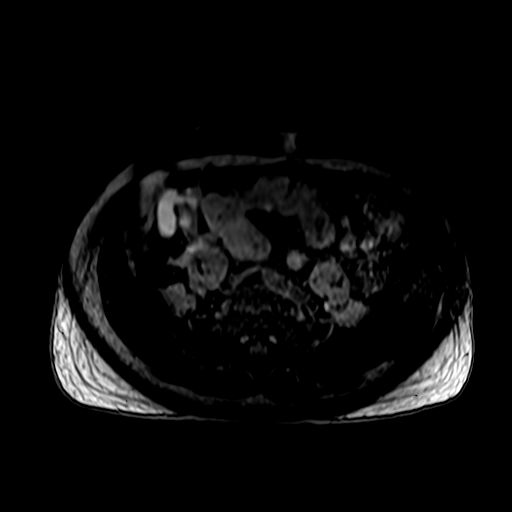
[im 56/112]
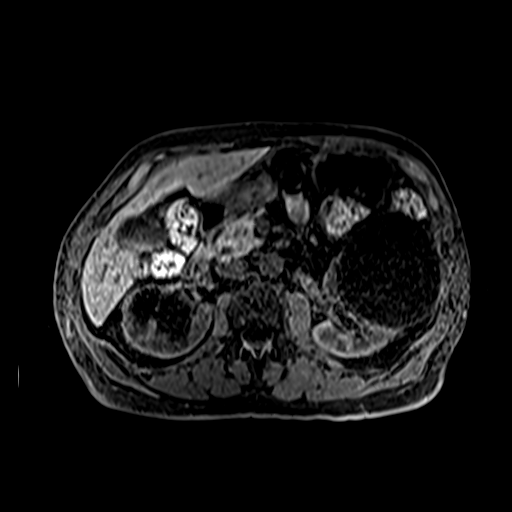
[im 112/112]
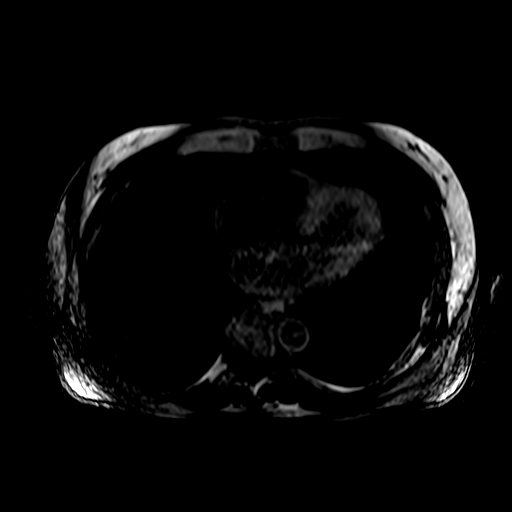

[Series 10: post 25 sec · axial · 2.0mm · 0.78mm/px · z∈[-96,+126]mm · 4 of 112 slices shown]
[im 1/112]
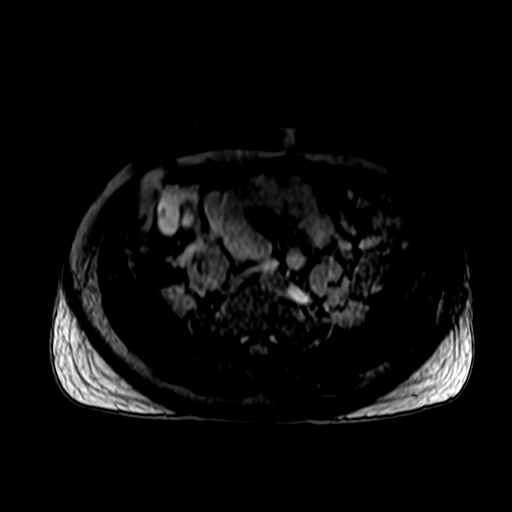
[im 38/112]
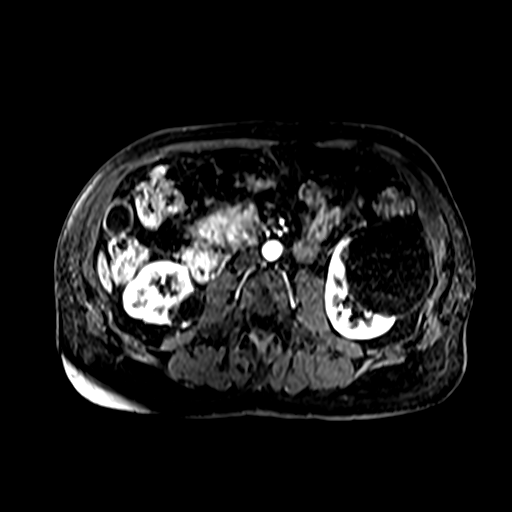
[im 75/112]
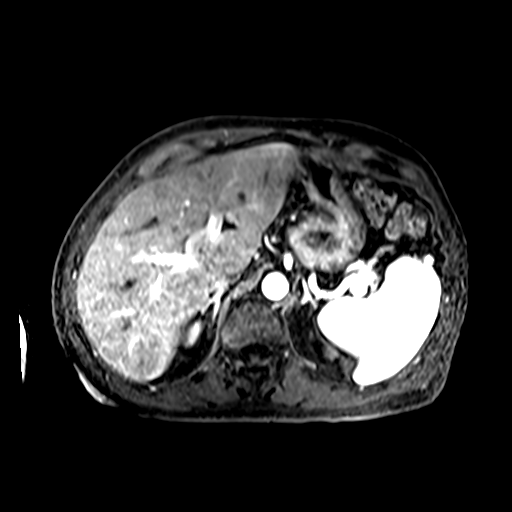
[im 112/112]
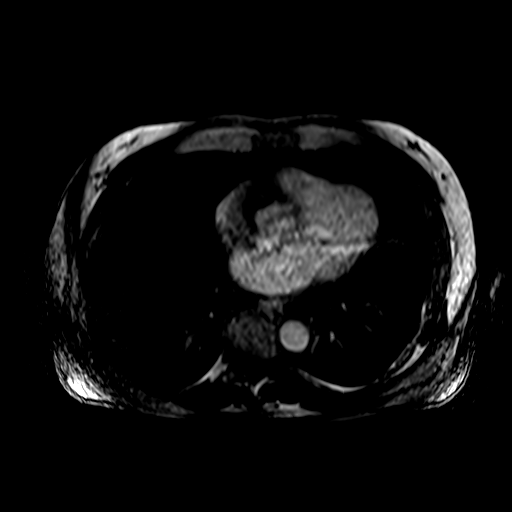

[Series 11: post 25 sec_sub · axial · 2.0mm · 0.78mm/px · z∈[-96,+126]mm · 4 of 112 slices shown]
[im 1/112]
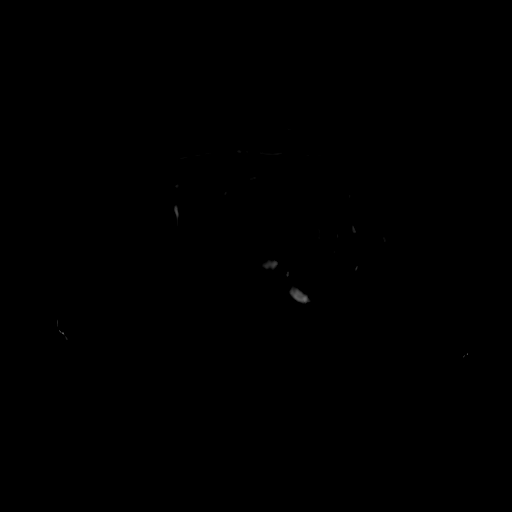
[im 38/112]
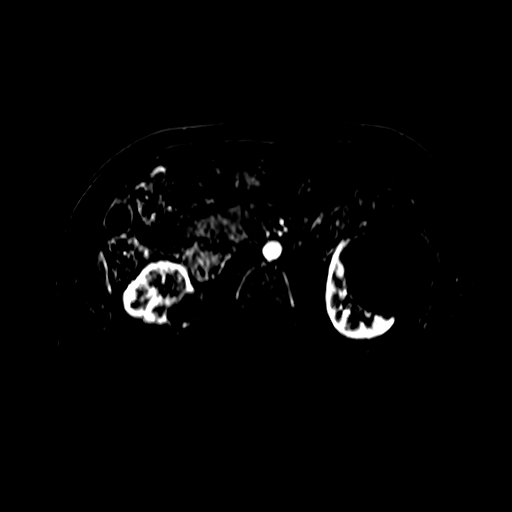
[im 75/112]
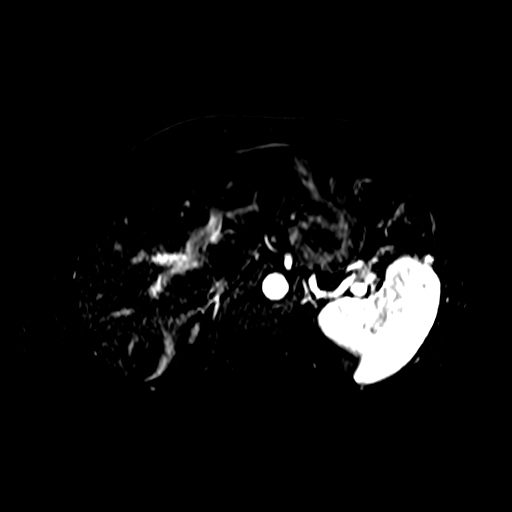
[im 112/112]
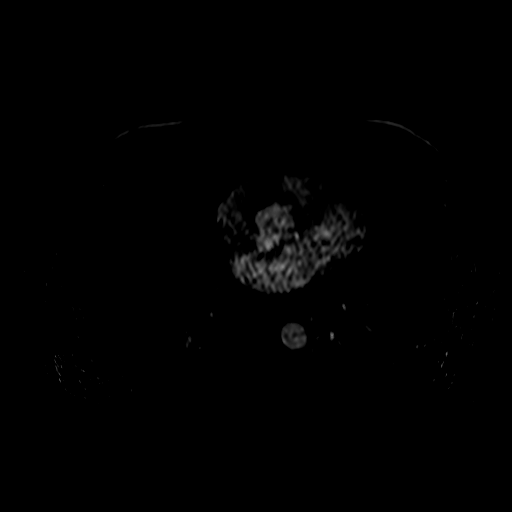

[Series 12: post 45 sec · axial · 2.0mm · 0.78mm/px · z∈[-96,+52]mm · 3 of 112 slices shown]
[im 1/112]
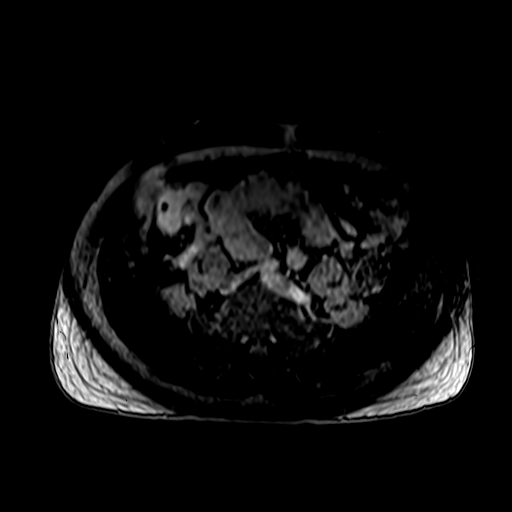
[im 38/112]
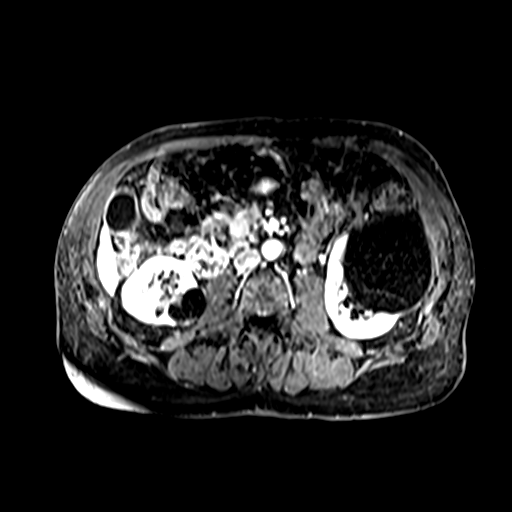
[im 75/112]
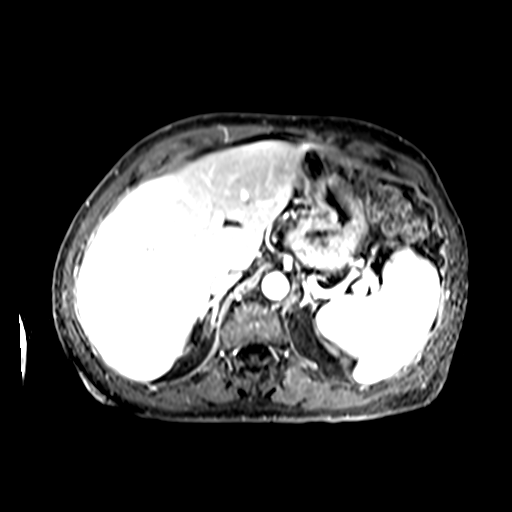

[24 of 48 positions shown; findings below may reference images not displayed]

FINDINGS: Lower chest: Unremarkable.

Hepatobiliary: No suspicious cystic or solid hepatic lesions. No
intra or extrahepatic biliary ductal dilatation. Gallbladder is
normal in appearance.

Pancreas: No pancreatic mass. No pancreatic ductal dilatation. No
pancreatic or peripancreatic fluid collections or inflammatory
changes.

Spleen:  Unremarkable.

Adrenals/Urinary Tract: Multiple T1 hypointense, T2 hyperintense,
nonenhancing lesions in the kidneys bilaterally, majority of which
appear to represent simple cysts, largest of which is exophytic in
the interpolar region of the left kidney anteriorly measuring 9.2 x
8.0 cm. The lesion of concern in the interpolar region of the right
kidney measures 5.2 x 4.2 cm (axial image 24 of series 3) and is
also T1 hypointense, T2 hyperintense, with multiple thin internal
septations but no significant mural thickening, or internal
enhancement, compatible with a Bosniak class 2 cyst. No other
suspicious lesions. No hydroureteronephrosis in the visualized
portions of the abdomen. Bilateral adrenal glands are normal in
appearance.

Stomach/Bowel: Visualized portions are unremarkable.

Vascular/Lymphatic: No aneurysm identified in the visualized
abdominal vasculature. No lymphadenopathy noted in the abdomen.

Other: No significant volume of ascites noted in the visualized
portions of the peritoneal cavity.

Musculoskeletal: No aggressive appearing osseous lesions are noted
in the visualized portions of the skeleton.
IMPRESSION: 1. The lesion of concern in the interpolar region of the right
kidney has imaging characteristics compatible with a Bosniak class 2
cyst.
2. Multiple other simple cyst (Bosniak class 1) in the kidneys
bilaterally.

## 2021-03-10 ENCOUNTER — Encounter (HOSPITAL_COMMUNITY): Payer: Self-pay | Admitting: Urology

## 2021-03-10 ENCOUNTER — Other Ambulatory Visit: Payer: Self-pay | Admitting: Urology

## 2021-03-10 ENCOUNTER — Other Ambulatory Visit: Payer: Self-pay

## 2021-03-10 NOTE — Progress Notes (Addendum)
COVID Vaccine Completed: Yes Date COVID Vaccine completed: 01/06/20 Has received booster: Yes COVID vaccine manufacturer: Pfizer    Date of COVID positive in last 90 days: No  PCP -Jeannie Done, MD  Cardiologist - Lamar Blinks MD  Chest x-ray - greater than 1 year in epic EKG - 07/14/20 in chart Stress Test - Greater than 1 year ECHO - Greater than 2 years Cardiac Cath - N/A Pacemaker/ICD device last checked:N/A  Sleep Study - N/A CPAP - N/A  Fasting Blood Sugar - N/A Checks Blood Sugar __N/A___ times a day  Blood Thinner Instructions:N/A Aspirin Instructions:N/A Last Dose:N/A  Activity level:  Can go up a flight of stairs and activities of daily living without stopping and without symptoms   Anesthesia review: N/A  Patient denies shortness of breath, fever, cough and chest pain at PAT appointment   Patient verbalized understanding of instructions that were given to them at the PAT appointment. Patient was also instructed that they will need to review over the PAT instructions again at home before surgery.

## 2021-03-13 ENCOUNTER — Encounter (HOSPITAL_COMMUNITY)
Admission: RE | Disposition: A | Discharge status: Home / Self Care | Payer: Self-pay | Source: Home / Self Care | Attending: Urology

## 2021-03-13 ENCOUNTER — Encounter (HOSPITAL_COMMUNITY): Payer: Self-pay | Admitting: Urology

## 2021-03-13 ENCOUNTER — Ambulatory Visit (HOSPITAL_COMMUNITY): Payer: Medicare Other | Admitting: Anesthesiology

## 2021-03-13 ENCOUNTER — Ambulatory Visit (HOSPITAL_COMMUNITY)
Admission: RE | Admit: 2021-03-13 | Discharge: 2021-03-13 | Disposition: A | Discharge status: Home / Self Care | Payer: Medicare Other | Attending: Urology | Admitting: Urology

## 2021-03-13 DIAGNOSIS — N281 Cyst of kidney, acquired: Secondary | ICD-10-CM | POA: Diagnosis not present

## 2021-03-13 DIAGNOSIS — Z791 Long term (current) use of non-steroidal anti-inflammatories (NSAID): Secondary | ICD-10-CM | POA: Diagnosis not present

## 2021-03-13 DIAGNOSIS — Z923 Personal history of irradiation: Secondary | ICD-10-CM | POA: Insufficient documentation

## 2021-03-13 DIAGNOSIS — Z8546 Personal history of malignant neoplasm of prostate: Secondary | ICD-10-CM | POA: Diagnosis not present

## 2021-03-13 DIAGNOSIS — R338 Other retention of urine: Secondary | ICD-10-CM | POA: Diagnosis not present

## 2021-03-13 DIAGNOSIS — N35912 Unspecified bulbous urethral stricture, male: Secondary | ICD-10-CM | POA: Diagnosis not present

## 2021-03-13 DIAGNOSIS — Z79899 Other long term (current) drug therapy: Secondary | ICD-10-CM | POA: Insufficient documentation

## 2021-03-13 DIAGNOSIS — N401 Enlarged prostate with lower urinary tract symptoms: Secondary | ICD-10-CM | POA: Diagnosis not present

## 2021-03-13 DIAGNOSIS — Z87891 Personal history of nicotine dependence: Secondary | ICD-10-CM | POA: Diagnosis not present

## 2021-03-13 DIAGNOSIS — Z882 Allergy status to sulfonamides status: Secondary | ICD-10-CM | POA: Insufficient documentation

## 2021-03-13 HISTORY — PX: CYSTOSCOPY WITH URETHRAL DILATATION: SHX5125

## 2021-03-13 LAB — BASIC METABOLIC PANEL
Anion gap: 10 (ref 5–15)
BUN: 22 mg/dL (ref 8–23)
CO2: 24 mmol/L (ref 22–32)
Calcium: 9.2 mg/dL (ref 8.9–10.3)
Chloride: 102 mmol/L (ref 98–111)
Creatinine, Ser: 1.03 mg/dL (ref 0.61–1.24)
GFR, Estimated: >60 mL/min (ref >60)
Glucose, Bld: 103 mg/dL — ABNORMAL HIGH (ref 70–99)
Potassium: 3.7 mmol/L (ref 3.5–5.1)
Sodium: 136 mmol/L (ref 135–145)

## 2021-03-13 LAB — CBC
HCT: 50.1 % (ref 39.0–52.0)
Hemoglobin: 16.5 g/dL (ref 13.0–17.0)
MCH: 32.9 pg (ref 26.0–34.0)
MCHC: 32.9 g/dL (ref 30.0–36.0)
MCV: 100 fL (ref 80.0–100.0)
Platelets: 187 10*3/uL (ref 150–400)
RBC: 5.01 MIL/uL (ref 4.22–5.81)
RDW: 13 % (ref 11.5–15.5)
WBC: 5.8 10*3/uL (ref 4.0–10.5)
nRBC: 0 % (ref 0.0–0.2)

## 2021-03-13 SURGERY — CYSTOSCOPY, WITH URETHRAL DILATION
Anesthesia: General

## 2021-03-13 MED ORDER — ONDANSETRON HCL 4 MG/2ML IJ SOLN
INTRAMUSCULAR | Status: DC | PRN
  Administered 2021-03-13: 4 mg via INTRAVENOUS

## 2021-03-13 MED ORDER — AMISULPRIDE (ANTIEMETIC) 5 MG/2ML IV SOLN: 10.0000 mg | Freq: Once | INTRAVENOUS | Status: DC | PRN

## 2021-03-13 MED ORDER — TRAMADOL HCL 50 MG PO TABS: 50.0000 mg | ORAL_TABLET | Freq: Four times a day (QID) | ORAL | 0 refills | Status: DC | PRN

## 2021-03-13 MED ORDER — LACTATED RINGERS IV SOLN: INTRAVENOUS | Status: DC

## 2021-03-13 MED ORDER — FENTANYL CITRATE (PF) 100 MCG/2ML IJ SOLN
INTRAMUSCULAR | Status: DC | PRN
  Administered 2021-03-13: 50 ug via INTRAVENOUS

## 2021-03-13 MED ORDER — DIATRIZOATE MEGLUMINE 30 % UR SOLN
URETHRAL | Status: AC
  Filled 2021-03-13: qty 100

## 2021-03-13 MED ORDER — ACETAMINOPHEN 325 MG PO TABS
325.0000 mg | ORAL_TABLET | ORAL | Status: DC | PRN
Start: 2021-03-13 — End: 2021-03-13

## 2021-03-13 MED ORDER — LIDOCAINE 2% (20 MG/ML) 5 ML SYRINGE
INTRAMUSCULAR | Status: DC | PRN
  Administered 2021-03-13: 100 mg via INTRAVENOUS

## 2021-03-13 MED ORDER — PROMETHAZINE HCL 25 MG/ML IJ SOLN: 6.2500 mg | INTRAMUSCULAR | Status: DC | PRN

## 2021-03-13 MED ORDER — STERILE WATER FOR IRRIGATION IR SOLN
Status: DC | PRN
  Administered 2021-03-13: 500 mL

## 2021-03-13 MED ORDER — FENTANYL CITRATE (PF) 100 MCG/2ML IJ SOLN: 25.0000 ug | INTRAMUSCULAR | Status: DC | PRN

## 2021-03-13 MED ORDER — OXYCODONE HCL 5 MG PO TABS: 5.0000 mg | ORAL_TABLET | Freq: Once | ORAL | Status: DC | PRN

## 2021-03-13 MED ORDER — OXYCODONE HCL 5 MG/5ML PO SOLN: 5.0000 mg | Freq: Once | ORAL | Status: DC | PRN

## 2021-03-13 MED ORDER — ACETAMINOPHEN 160 MG/5ML PO SOLN: 325.0000 mg | ORAL | Status: DC | PRN

## 2021-03-13 MED ORDER — DEXAMETHASONE SODIUM PHOSPHATE 10 MG/ML IJ SOLN
INTRAMUSCULAR | Status: DC | PRN
  Administered 2021-03-13: 8 mg via INTRAVENOUS

## 2021-03-13 MED ORDER — 0.9 % SODIUM CHLORIDE (POUR BTL) OPTIME
TOPICAL | Status: DC | PRN
  Administered 2021-03-13: 1000 mL

## 2021-03-13 MED ORDER — CEFAZOLIN SODIUM-DEXTROSE 2-4 GM/100ML-% IV SOLN
2.0000 g | Freq: Once | INTRAVENOUS | Status: AC
  Administered 2021-03-13: 2 g via INTRAVENOUS
  Filled 2021-03-13: qty 100

## 2021-03-13 MED ORDER — CHLORHEXIDINE GLUCONATE 0.12 % MT SOLN
15.0000 mL | Freq: Once | OROMUCOSAL | Status: AC
  Administered 2021-03-13: 15 mL via OROMUCOSAL

## 2021-03-13 MED ORDER — DEXAMETHASONE SODIUM PHOSPHATE 10 MG/ML IJ SOLN
INTRAMUSCULAR | Status: AC
  Filled 2021-03-13: qty 1

## 2021-03-13 MED ORDER — STERILE WATER FOR IRRIGATION IR SOLN
Status: DC | PRN
  Administered 2021-03-13: 3000 mL

## 2021-03-13 MED ORDER — ORAL CARE MOUTH RINSE: 15.0000 mL | Freq: Once | OROMUCOSAL | Status: AC

## 2021-03-13 MED ORDER — PROPOFOL 10 MG/ML IV BOLUS
INTRAVENOUS | Status: DC | PRN
  Administered 2021-03-13: 180 mg via INTRAVENOUS

## 2021-03-13 MED ORDER — ACETAMINOPHEN 10 MG/ML IV SOLN: 1000.0000 mg | Freq: Once | INTRAVENOUS | Status: DC | PRN

## 2021-03-13 MED ORDER — PROPOFOL 10 MG/ML IV BOLUS
INTRAVENOUS | Status: AC
  Filled 2021-03-13: qty 20

## 2021-03-13 MED ORDER — ONDANSETRON HCL 4 MG/2ML IJ SOLN
INTRAMUSCULAR | Status: AC
  Filled 2021-03-13: qty 2

## 2021-03-13 MED ORDER — FENTANYL CITRATE (PF) 100 MCG/2ML IJ SOLN
INTRAMUSCULAR | Status: AC
  Filled 2021-03-13: qty 2

## 2021-03-13 SURGICAL SUPPLY — 20 items
BAG URINE DRAIN 2000ML AR STRL (UROLOGICAL SUPPLIES) ×2 IMPLANT
BALLN NEPHROSTOMY (BALLOONS) ×2
BALLOON NEPHROSTOMY (BALLOONS) ×1 IMPLANT
CATH FOLEY 2W COUNCIL 20FR 5CC (CATHETERS) IMPLANT
CATH FOLEY 2W COUNCIL 5CC 18FR (CATHETERS) ×2 IMPLANT
CATH INTERMIT  6FR 70CM (CATHETERS) IMPLANT
CATH ROBINSON RED A/P 14FR (CATHETERS) IMPLANT
CATH URET 5FR 28IN CONE TIP (BALLOONS)
CATH URET 5FR 70CM CONE TIP (BALLOONS) IMPLANT
CLOTH BEACON ORANGE TIMEOUT ST (SAFETY) IMPLANT
GLOVE SURG ENC MOIS LTX SZ7.5 (GLOVE) ×2 IMPLANT
GOWN STRL REUS W/TWL LRG LVL3 (GOWN DISPOSABLE) ×2 IMPLANT
GUIDEWIRE ANG ZIPWIRE 038X150 (WIRE) IMPLANT
GUIDEWIRE STR DUAL SENSOR (WIRE) ×2 IMPLANT
KIT TURNOVER KIT A (KITS) ×2 IMPLANT
MANIFOLD NEPTUNE II (INSTRUMENTS) ×2 IMPLANT
NS IRRIG 1000ML POUR BTL (IV SOLUTION) IMPLANT
PACK CYSTO (CUSTOM PROCEDURE TRAY) ×2 IMPLANT
PENCIL SMOKE EVACUATOR (MISCELLANEOUS) IMPLANT
WATER STERILE IRR 3000ML UROMA (IV SOLUTION) ×2 IMPLANT

## 2021-03-13 NOTE — Discharge Instructions (Signed)

## 2021-03-13 NOTE — Transfer of Care (Signed)
Immediate Anesthesia Transfer of Care Note  Patient: Andric Kerce  Procedure(s) Performed: CYSTOSCOPY WITH URETHRAL BALLOON DILATATION/ FOLEY CATHETER PLACEMENT (N/A )  Patient Location: PACU  Anesthesia Type:General  Level of Consciousness: sedated  Airway & Oxygen Therapy: Patient Spontanous Breathing and Patient connected to face mask oxygen  Post-op Assessment: Report given to RN and Post -op Vital signs reviewed and stable  Post vital signs: Reviewed and stable  Last Vitals:  Vitals Value Taken Time  BP    Temp    Pulse 67 03/13/21 1613  Resp 10 03/13/21 1613  SpO2 100 % 03/13/21 1613  Vitals shown include unvalidated device data.  Last Pain:  Vitals:   03/13/21 1351  TempSrc: Oral  PainSc: 0-No pain         Complications: No complications documented.

## 2021-03-13 NOTE — H&P (Signed)
CC/HPI: cc: Work-in for possible urinary retention   Prostate Cancer - patient's PSA on 04/21/2020 is 0.015. He was treated with brachytherapy and external beam.   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 No further surveillance imaging needed.   ED - patient uses sildenafil which helps. He will let me know when he needs a refill.   BPH - patient using tamsulosin and finasteride without difficulty. His urinary symptoms are stable.   Low testosterone - patient uses testosterone replacement and most recent testosterone levels were in the 500s.      03/03/21: 70 year old man who underwent cystoscopy with bladder biopsy, urethral balloon dilation 08/2020. Patient comes in today as a work-in for urinary retention. Over the last few weeks he has only been able to void a few drops at night and history of progressively improved during the course the day. His PVR in the office today is 124 mL. He denies any hematuria. Urinalysis today is consistent with infection.   03/10/21: The patient states symptoms have not significantly improved since taking antibiotics. His urinalysis today continues to show bacteria.     ALLERGIES: Sulfa Drugs    MEDICATIONS: Androgel 20.25 mg/1.25 gram per actuation (1.62 %) gel in metered-dose pump  Finasteride 5 mg tablet 1 tablet PO Daily  Sildenafil Citrate 20 mg tablet 1-2 tablet PO PRN  Tamsulosin Hcl 0.4 mg capsule 1 capsule PO Q HS  Toviaz 8 mg tablet, extended release 24 hr 1 tablet PO Daily  Amlodipine Besylate 5 mg tablet Oral  Celecoxib 100 mg capsule 1 capsule PO Bedtime  Cephalexin 500 mg capsule 1 capsule PO QID  Ciclopirox 0.77 % cream External  Dicyclomine Hcl 20 mg tablet  Doxepin Hcl 50 mg capsule  LevETIRAcetam 500 MG Oral Tablet Oral  Oxtellar Xr 600 mg tablet, extended release 24 hr Oral  Stelara     GU PSH: Cysto Bladder Stone <2.5cm - 08/23/2020, 03/13/2019 Cysto Bladder Ureth Biopsy - 2013 Cysto Dilate Stricture (M or F) -  08/23/2020, 03/13/2019 Cystoscopy - 08/11/2020, 2020 Cystoscopy TURBT <2 cm - 08/23/2020 Cystoscopy Ureteroscopy - 2010 Locm 300-399Mg /Ml Iodine,1Ml - 10/05/2019, 2020 TRANSPERI NEEDLE PLACE, PROS - 2015       PSH Notes: Inguinal Hernia Repair, Surgery Prostate Transperineal Placement Of Needles, Cystoscopy With Biopsy, Craniotomy Suboccipital Excision Of Meningioma, Cystoscopy With Ureteroscopy Left, Arthroscopy Knee Right   NON-GU PSH: Knee Arthroscopy; Dx - 2009 Remove Brain Lining Lesion - 2011     GU PMH: Incomplete bladder emptying - 03/03/2021 Weak Urinary Stream - 03/03/2021 BPH w/LUTS (Stable), Patient to continue tamsulosin and finasteride. - 12/22/2020, Benign prostatic hyperplasia with urinary obstruction, - 2015 Bulbar urethral stricture, Patient is voiding well without difficulty. Will repeat PVR next visit. - 12/22/2020, - 09/13/2020, - 08/26/2020, Based on cystoscopy today would like to proceed with cystoscopy, balloon dilation of urethral stricture, and laser lithotripsy of stones. I discussed the risks and benefits of the procedure including but not limited to pain, bleeding, recurrence of stricture, damage to surrounding structures including urethra and bladder, need for Foley catheter or additional procedures after this. Patient understands and wishes to proceed., - 08/11/2020, I discovered a moderate bulbar urethral stricture that would not allow passage of the flexible cystoscope even with pressure and I did not want to cause him any discomfort in the office since I am going to have to take care of his bladder calculi as well so we discussed performing dilation at the time of his cystolitholapaxy., -  2020 History of prostate cancer, Repeat PSA in July 2022 - 12/22/2020, - 09/13/2020, He has nothing to suggest recurrence with no abnormality on DRE and a PSA that remains undetectable., - 10/12/2019 (Stable), Although he has a history of prostate cancer treated with radiation I told him I  was not particularly concerned about the possibility of this being secondary to prostate cancer but rather more likely secondary to the effects of radiation., - 2020 (Stable), His prostate cancer remains under excellent control with no abnormality noted on DRE and PSA that remains undetectable., - 10/06/2018 (Stable), His erectile dysfunction is likely multifactorial but most likely secondary to the treatment of his prostate cancer with radiation. Also up with me as previously scheduled for his annual prostate cancer follow-up appointment, - 2019 (Stable), At this point it appears his prostate cancer has been well controlled with a PSA that is currently 0.017. I will continue to see him on a yearly basis., - 2018 (Stable), His prostate remains flat and benign to examination. His PSA remains undetectable. I will continue to see him on a yearly basis for DRE and PSA., - 2017, History of prostate cancer, - 2016 Microscopic hematuria, Patient had gross hematuria workup late last year. Will continue to monitor this. - 12/22/2020 Nocturia - 12/22/2020, - 09/13/2020, Nocturia, - 2016 Renal cyst, Right, No further imaging necessary for simple cyst - 12/22/2020, Right, Has Bosniak 2 F cysts. It was suggested that further characterization may be attained with an MRI scan so I will schedule him for that in 6 months., - 10/12/2019, Renal cysts, acquired, bilateral, - 2014 Gross hematuria - 09/13/2020, He is experienced gross hematuria and has had previous ionizing radiation and therefore a full evaluation will be undertaken. He has had a history of stones as well so I will obtain a CT scan to evaluate the upper tract and then he will return for completion of his workup with cystoscopy., - 2020 Urinary Urgency - 09/13/2020, (Worsening), He uses DDAVP at night but still is having some nocturia and also is having some urgency and therefore I have recommended a trial of Toviaz to see if he notes this helps any better than the  Myrbetriq., - 10/06/2018 Calculus of LUT, Unspec - 08/26/2020, - 08/11/2020 Primary hypogonadism, This is managed with AndroGel 3 pumps. He will remain on this. - 10/12/2019, (Stable), We discussed the fact that his serum testosterone level was found to be above the reference range. He felt that that was the day that he used to of the packets so what I have recommended is he decreased the dosage down to 1 packet daily., - 10/06/2018, Hypogonadism, testicular, - 2016 Bladder Stone, The source of his gross hematuria is almost certainly the stones located within his bladder on CT scan. I was not able to visualize these due to his stricture. - 2020 Renal calculus, Right, He has nonobstructing right renal calculi. I do not feel that this in any way contributed to his gross hematuria - 2020 ED due to arterial insufficiency (Worsening), He is going to use sildenafil 20 mg titrating up to 100 mg as needed. - 2019 Urinary Frequency, He is having some new frequency and his nocturia has increased as well. He is using tamsulosin and also Celebrex which resulted in resolution of his nocturia but what I have recommended is a trial of Myrbetriq 25 mg. I have given him samples of this. - 2018, Increased urinary frequency, - 2014 Unil Inguinal Hernia W/O obst or gang,non-recurrent, Right inguinal  hernia - 01/08/2014 Elevated PSA, PSA,Elevated - 2013-01-08 Peyronies Disease, Peyronie's disease - Jan 08, 2013 Ureteral calculus, Ureteral Stone Jan 08, 2013 Urinary Retention, Unspec, Incomplete bladder emptying - Jan 08, 2013      PMH Notes: Prostate cancer:  Date of diagnosis: 10/29/08  Pretreatment PSA: 5.2  Gleason score: 4+3=7, 5/12 cores positive with only one core positive for Gleason 4+3.  Stage - T1c  Treatment: External Beam Radiation 3-4/10 / Brachytherapy 03/04/09   Nephrolithiasis: In 7/10 he underwent a CT scan because of abdominal complaints and was found to have a 3 mm distal right ureteral calculus but no renal calculi were identified. He  subsequently passed right ureteral stone. In 10/10 he underwent left ureteroscopy for what appeared to be a stone overlying the left ureter without progression. The stone was located over the sacral region however at the time of his retrograde pyelogram and ureteroscopy no stone could be identified within the ureter.  CT scan in the 5/20 revealed a right renal calculi with no left renal stones noted.   Nocturia: He had noted nocturia as well as some daytime frequency and had been treated with a 5 alpha reductase inhibitor as well as an alpha-blocker but remains symptomatic. I tried DDAVP however this resulted in worsening of his hesitancy he did not want to remain on this therapy. I had prescribed anticholinergics in the past but he was reluctant to reinitiate this form of therapy because of his history of ulcerative colitis but once that was under control he elected to undergo a trial of VESIcare at a 10 mg dose which was ineffective. I recommended DDAVP which was started in 5/13.   Hypogonadism: He was found to have hypogonadism with a low free testosterone level of 5.5 in 2/11. Initially I recommended he not undergo testosterone replacement since he had just been diagnosed with prostate cancer. Now that he has shown a significant fall and stabilization of his PSA I therefore initiated testosterone replacement therapy in 5/13.  Current therapy: AndroGel 4 pumps q.a.m.   BPH with outlet obstruction: He developed hematuria with clot retention and eventually voided without difficulty but I placed him on finasteride (5/13) in order to prevent this from occurring in the future.  Current therapy: Tamsulosin and finasteride.   Gross hematuria: He initially experienced gross hematuria in 2/20. A CT scan in 5/20 revealed no abnormality of the upper tract and bladder calculi. Cystoscopy revealed a bulbar urethral stricture.   Bulbar urethral stricture: This was discovered at the time of cystoscopy and treated  with balloon dilatation on 03/13/19.   Bilateral renal cysts: He was found have bilateral renal cysts by CT scan in 5/20. There were simple with a Bosniak 60F cyst on the right.   Bladder calculi: He was found to have bladder calculi in underwent cystolitholapaxy on 03/13/19.     NON-GU PMH: Encounter for general adult medical examination without abnormal findings, Encounter for preventive health examination - 2014-01-08 Personal history of other diseases of the circulatory system, History of hypertension - 01-08-13 Personal history of other endocrine, nutritional and metabolic disease, History of hypercholesterolemia - Jan 08, 2013 Personal history of other specified conditions, History of heartburn - 2013-01-08    FAMILY HISTORY: Death In The Family Father - Father Death In The Family Mother - Mother Family Health Status Number - Runs In Family   SOCIAL HISTORY: Marital Status: Married Preferred Language: English; Ethnicity: Not Hispanic Or Latino; Race: White Current Smoking Status: Patient does not smoke anymore.   Tobacco Use Assessment Completed:  Used Tobacco in last 30 days? Does drink.  Drinks 3 caffeinated drinks per day.     Notes: Former smoker, Caffeine Use, Marital History - Currently Married, Alcohol Use, Tobacco Use, Occupation:   REVIEW OF SYSTEMS:    GU Review Male:   Patient denies frequent urination, hard to postpone urination, burning/ pain with urination, get up at night to urinate, leakage of urine, stream starts and stops, trouble starting your stream, have to strain to urinate , erection problems, and penile pain.  Gastrointestinal (Upper):   Patient denies nausea, vomiting, and indigestion/ heartburn.  Gastrointestinal (Lower):   Patient denies diarrhea and constipation.  Constitutional:   Patient denies fever, night sweats, weight loss, and fatigue.  Skin:   Patient denies skin rash/ lesion and itching.  Eyes:   Patient denies blurred vision and double vision.  Ears/ Nose/ Throat:    Patient denies sore throat and sinus problems.  Hematologic/Lymphatic:   Patient denies swollen glands and easy bruising.  Cardiovascular:   Patient denies leg swelling and chest pains.  Respiratory:   Patient denies cough and shortness of breath.  Endocrine:   Patient denies excessive thirst.  Musculoskeletal:   Patient denies back pain and joint pain.  Neurological:   Patient denies headaches and dizziness.  Psychologic:   Patient denies depression and anxiety.   VITAL SIGNS: None   MULTI-SYSTEM PHYSICAL EXAMINATION:    Constitutional: Well-nourished. No physical deformities. Normally developed. Good grooming.  Neck: Neck symmetrical, not swollen. Normal tracheal position.  Respiratory: No labored breathing, no use of accessory muscles.   Skin: No paleness, no jaundice, no cyanosis. No lesion, no ulcer, no rash.  Neurologic / Psychiatric: Oriented to time, oriented to place, oriented to person. No depression, no anxiety, no agitation.  Gastrointestinal: No rigidity, non obese abdomen.   Eyes: Normal conjunctivae. Normal eyelids.  Ears, Nose, Mouth, and Throat: Left ear no scars, no lesions, no masses. Right ear no scars, no lesions, no masses. Nose no scars, no lesions, no masses. Normal hearing. Normal lips.  Musculoskeletal: Normal gait and station of head and neck.     Complexity of Data:  Records Review:   Previous Patient Records, POC Tool  Urine Test Review:   Urinalysis, Urine Culture   04/21/20 09/29/19 09/23/18 09/13/17 05/30/17 09/18/16 09/14/15 09/17/14  PSA  Total PSA <0.015 ng/mL <0.015 ng/mL <0.015 ng/mL 0.017 ng/mL <0.015 ng/dL < 0.015  <0.01  <0.01     04/21/20 09/25/19 09/23/18 05/30/17 04/05/14 03/03/14 08/21/13 08/19/12  Hormones  Testosterone, Total 595.4 ng/dL 705.9 ng/dL 1350.7 ng/dL 543.9 ng/dL 467  273  280  478.95     PROCEDURES:         Flexible Cystoscopy - 52000  Risks, benefits, and some of the potential complications of the procedure were discussed  at length with the patient including infection, bleeding, voiding discomfort, urinary retention, fever, chills, sepsis, and others. All questions were answered. Informed consent was obtained. Sterile technique and intraurethral analgesia were used.  Meatus:  Normal size. Normal location. Normal condition.  Urethra:  Moderate membranous stricture - unable to traverse scope  External Sphincter:  Normal.      The lower urinary tract was carefully examined. The procedure was well-tolerated and without complications. Antibiotic instructions were given. Instructions were given to call the office immediately for bloody urine, difficulty urinating, urinary retention, painful or frequent urination, fever, chills, nausea, vomiting or other illness. The patient stated that he understood these instructions and would comply with them.  Urinalysis w/Scope Dipstick Dipstick Cont'd Micro  Color: Yellow Bilirubin: Neg mg/dL WBC/hpf: 20 - 40/hpf  Appearance: Clear Ketones: Neg mg/dL RBC/hpf: 20 - 40/hpf  Specific Gravity: 1.025 Blood: 3+ ery/uL Bacteria: Mod (26-50/hpf)  pH: 5.5 Protein: 1+ mg/dL Cystals: NS (Not Seen)  Glucose: Neg mg/dL Urobilinogen: 0.2 mg/dL Casts: NS (Not Seen)    Nitrites: Neg Trichomonas: Not Present    Leukocyte Esterase: 2+ leu/uL Mucous: Present      Epithelial Cells: 0 - 5/hpf      Yeast: NS (Not Seen)      Sperm: Not Present    ASSESSMENT:      ICD-10 Details  1 GU:   Bulbar urethral stricture - N35.011 Chronic, Exacerbation   PLAN:            Medications New Meds: Macrobid 100 mg capsule 1 capsule PO BID   #28  0 Refill(s)            Orders Labs CULTURE, URINE          Document Letter(s):  Created for Patient: Clinical Summary         Notes:   Patient with recurrence of posterior urethral stricture seen on attempted cystoscopy today. He will be scheduled for cystoscopy with urethral balloon dilation in the operating room next week. Risks and benefits of  the procedure were discussed with the patient in detail. He knows he will have a Foley catheter for 3 days afterwards. He also knows that he may leak more after this is been performed. He will remain on antibiotics until that time.   cc: Iran Planas, MD

## 2021-03-13 NOTE — Op Note (Signed)
Operative Note  Preoperative diagnosis:  1.  Recurrent prostatic urethral stricture  Postoperative diagnosis: 1.  Recurrent prostatic urethral stricture  Procedure(s): 1.  Cystoscopy with urethral balloon dilation 2.  Foley catheter placement over wire  Surgeon: Jacalyn Lefevre, MD  Assistants:  None  Anesthesia:  General  Complications:  None  EBL:  minimal  Specimens: 1. none  Drains/Catheters: 1.  18Fr council tip foley  Intraoperative findings:   1. Normal anterior urethra 2. Dense posterior urethra balloon dilated with Ultrex balloon dilator 3. Prostatic urethra and bladder neck with pale tissue and calcification adherent easily sloughed off with scope 4. Cystoscopy demonstrated normal bladder mucosa with moderate trabeculation and debris 5. 18 French council tip Foley catheter  Indication:  Wesley [name] is a 70 y.o. male with history of prostate cancer status post brachytherapy and external beam radiation therapy with prostatic urethral stricture.  He underwent urethral dilation November 2021 and has now returned with similar symptoms.  Cystoscopy revealed dense prostatic urethral stricture.  Description of procedure:  After informed consent was obtained the patient taken to the operating room placed in supine position.  Anesthesia was induced and antibiotics were administered.  The patient was then repositioned in the dorsolithotomy position.  He was prepped and draped in usual sterile fashion time was performed.  22 French rigid cystoscope was placed in the urethral meatus and advanced to the posterior urethra.  At this point in time a dense stricture was seen in the prostatic urethra.  A 0.38 sensor wire was then advanced through the scope.  Next an Ultrex balloon dilator was placed over the wire so that it traversed the strictured area.  The balloon dilator was inflated with sterile saline to 18 mmHg.  After adequate dilation the dilator was decompressed  and the cystoscope was then easily placed over the wire into the bladder.  Noted in the prostatic urethra was dense scar tissue with calcification adherent that sloughed off easily with the scope.  Cystoscopic findings noted above.  The cystoscope was removed.  An 55 [place] council tip Foley was then placed over the wire.  It was inflated with 10 cc sterile water.  The patient emerged from general anesthesia and transferred the PACU in stable condition.  Plan: Patient will have a void trial in approximately 3 days.

## 2021-03-13 NOTE — Anesthesia Procedure Notes (Signed)
Procedure Name: LMA Insertion Date/Time: 03/13/2021 3:46 PM Performed by: Lind Covert, CRNA Pre-anesthesia Checklist: Patient identified, Emergency Drugs available, Suction available, Patient being monitored and Timeout performed Patient Re-evaluated:Patient Re-evaluated prior to induction Oxygen Delivery Method: Circle system utilized Preoxygenation: Pre-oxygenation with 100% oxygen Induction Type: IV induction LMA: LMA inserted LMA Size: 4.0 Tube type: Oral Number of attempts: 1 Placement Confirmation: positive ETCO2 and breath sounds checked- equal and bilateral Tube secured with: Tape Dental Injury: Teeth and Oropharynx as per pre-operative assessment

## 2021-03-13 NOTE — Anesthesia Preprocedure Evaluation (Addendum)
Anesthesia Evaluation  Patient identified by MRN, date of birth, ID band Patient awake    Reviewed: Allergy & Precautions, NPO status , Patient's Chart, lab work & pertinent test results  Airway Mallampati: I  TM Distance: >3 FB Neck ROM: Full    Dental  (+) Teeth Intact, Dental Advisory Given   Pulmonary former smoker,    breath sounds clear to auscultation       Cardiovascular hypertension, Pt. on medications  Rhythm:Regular Rate:Normal     Neuro/Psych  Headaches, Seizures -, Well Controlled,  negative psych ROS   GI/Hepatic Neg liver ROS, hiatal hernia, PUD,   Endo/Other  negative endocrine ROS  Renal/GU negative Renal ROS     Musculoskeletal negative musculoskeletal ROS (+)   Abdominal Normal abdominal exam  (+)   Peds  Hematology negative hematology ROS (+)   Anesthesia Other Findings   Reproductive/Obstetrics                            Anesthesia Physical Anesthesia Plan  ASA: II  Anesthesia Plan: General   Post-op Pain Management:    Induction:   PONV Risk Score and Plan: 3 and Ondansetron, Dexamethasone and Treatment may vary due to age or medical condition  Airway Management Planned: LMA  Additional Equipment: None  Intra-op Plan:   Post-operative Plan: Extubation in OR  Informed Consent: I have reviewed the patients History and Physical, chart, labs and discussed the procedure including the risks, benefits and alternatives for the proposed anesthesia with the patient or authorized representative who has indicated his/her understanding and acceptance.       Plan Discussed with: CRNA  Anesthesia Plan Comments:        Anesthesia Quick Evaluation

## 2021-03-13 NOTE — Interval H&P Note (Signed)
History and Physical Interval Note:  03/13/2021 2:58 PM  Wesley [name]  has presented today for surgery, with the diagnosis of URETHRAL STRICTURE.  The various methods of treatment have been discussed with the patient and family. After consideration of risks, benefits and other options for treatment, the patient has consented to  Procedure(s): Minor (N/A) as a surgical intervention.  The patient's history has been reviewed, patient examined, no change in status, stable for surgery.  I have reviewed the patient's chart and labs.  Questions were answered to the patient's satisfaction.     Colleene Swarthout D Dionna Wiedemann

## 2021-03-14 ENCOUNTER — Encounter (HOSPITAL_COMMUNITY): Payer: Self-pay | Admitting: Urology

## 2021-03-14 NOTE — Anesthesia Postprocedure Evaluation (Signed)
Anesthesia Post Note  Patient: Wesley [name]  Procedure(s) Performed: CYSTOSCOPY WITH URETHRAL BALLOON DILATATION/ FOLEY CATHETER PLACEMENT (N/A )     Patient location during evaluation: PACU Anesthesia Type: General Level of consciousness: awake and alert Pain management: pain level controlled Vital Signs Assessment: post-procedure vital signs reviewed and stable Respiratory status: spontaneous breathing, nonlabored ventilation, respiratory function stable and patient connected to nasal cannula oxygen Cardiovascular status: blood pressure returned to baseline and stable Postop Assessment: no apparent nausea or vomiting Anesthetic complications: no   No complications documented.  Last Vitals:  Vitals:   03/13/21 1630 03/13/21 1645  BP: (!) 162/86 (!) 139/92  Pulse: (!) 56 (!) 55  Resp: 10 (!) 9  Temp:    SpO2: 99% 96%    Last Pain:  Vitals:   03/13/21 1645  TempSrc:   PainSc: 0-No pain                 Effie Berkshire

## 2021-11-20 DIAGNOSIS — R339 Retention of urine, unspecified: Secondary | ICD-10-CM | POA: Diagnosis not present

## 2021-11-28 DIAGNOSIS — R8271 Bacteriuria: Secondary | ICD-10-CM | POA: Diagnosis not present

## 2021-11-28 DIAGNOSIS — N35011 Post-traumatic bulbous urethral stricture: Secondary | ICD-10-CM | POA: Diagnosis not present

## 2021-11-28 DIAGNOSIS — N3000 Acute cystitis without hematuria: Secondary | ICD-10-CM | POA: Diagnosis not present

## 2021-12-14 DIAGNOSIS — N35011 Post-traumatic bulbous urethral stricture: Secondary | ICD-10-CM | POA: Diagnosis not present

## 2021-12-14 DIAGNOSIS — R8271 Bacteriuria: Secondary | ICD-10-CM | POA: Diagnosis not present

## 2021-12-14 DIAGNOSIS — N210 Calculus in bladder: Secondary | ICD-10-CM | POA: Diagnosis not present

## 2021-12-18 ENCOUNTER — Other Ambulatory Visit: Payer: Self-pay | Admitting: Urology

## 2021-12-20 ENCOUNTER — Other Ambulatory Visit: Payer: Self-pay | Admitting: Urology

## 2021-12-25 DIAGNOSIS — R339 Retention of urine, unspecified: Secondary | ICD-10-CM | POA: Diagnosis not present

## 2021-12-28 ENCOUNTER — Encounter (HOSPITAL_BASED_OUTPATIENT_CLINIC_OR_DEPARTMENT_OTHER): Payer: Self-pay | Admitting: Urology

## 2021-12-29 ENCOUNTER — Encounter (HOSPITAL_BASED_OUTPATIENT_CLINIC_OR_DEPARTMENT_OTHER): Payer: Self-pay | Admitting: Urology

## 2021-12-29 ENCOUNTER — Other Ambulatory Visit: Payer: Self-pay

## 2021-12-29 NOTE — Progress Notes (Signed)
Spoke w/ via phone for pre-op interview--- pt ?Lab needs dos----  [facility] and ekg             ?Lab results------ no ?COVID test -----patient states asymptomatic no test needed ?Arrive at ------- 0600 on 01-02-2022 ?NPO after MN NO Solid Food.  Clear liquids from MN until--- 0530 ?Med rec completed ?Medications to take morning of surgery ----- keppra, sanctura ?Diabetic medication ----- n/a ?Patient instructed no nail polish to be worn day of surgery ?Patient instructed to bring photo id and insurance card day of surgery ?Patient aware to have Driver (ride ) / caregiver   for 24 hours after surgery --wife, debbie ?Patient Special Instructions ----- n/a ?Pre-Op special Istructions ----- n/a ?Patient verbalized understanding of instructions that were given at this phone interview. ?Patient denies shortness of breath, chest pain, fever, cough at this phone interview.  ? ? ?Anesthesia Review:  HTN;  non-cardiac related atypical chest pain  (pt stated taken a nitro 5 wks ago, time before that was 6 months);  s/p cerebral meningioma resection right temoral lobe 05/ 2010  with residual complex partial seizure and migraines  (pt stated last seizure 12/ 2022;  [facility];  s/p HH repair 2018 ?Pt denies cardiac s&s, sob, and no peripheral swelling ? ?PCP:  Dr Viona Gilmore. Tamala Julian ([facility] 10-16-2021 epic) ?Cardiologist :  Dr Mauricio Po [facility] 07-14-2021 epic,  pt was released on prn basis) ?Neurologist:  Dr D. Bjorn Loser [facility] 06-28-2021 epic) ?GI:  Dr Kelby Fam ([facility] 06-13-2021 epic) ?Chest x-ray : ?EKG :   copy of last 12 lead tracing w/ chart , dated in 2021  (per cardiology note , normal) ?Echo :  ?Stress test:  stress echo 08-05-2018 care everywhere ?Cardiac Cath :  no ?Activity level: denies sob w/ any activity ?Sleep Study/ CPAP : no ?

## 2022-01-02 ENCOUNTER — Ambulatory Visit (HOSPITAL_BASED_OUTPATIENT_CLINIC_OR_DEPARTMENT_OTHER): Payer: Medicare Other | Admitting: Anesthesiology

## 2022-01-02 ENCOUNTER — Encounter (HOSPITAL_BASED_OUTPATIENT_CLINIC_OR_DEPARTMENT_OTHER): Payer: Self-pay | Admitting: Urology

## 2022-01-02 ENCOUNTER — Ambulatory Visit (HOSPITAL_BASED_OUTPATIENT_CLINIC_OR_DEPARTMENT_OTHER)
Admission: RE | Admit: 2022-01-02 | Discharge: 2022-01-02 | Disposition: A | Discharge status: Home / Self Care | Payer: Medicare Other | Attending: Urology | Admitting: Urology

## 2022-01-02 ENCOUNTER — Encounter (HOSPITAL_BASED_OUTPATIENT_CLINIC_OR_DEPARTMENT_OTHER)
Admission: RE | Disposition: A | Discharge status: Home / Self Care | Payer: Self-pay | Source: Home / Self Care | Attending: Urology

## 2022-01-02 DIAGNOSIS — T191XXA Foreign body in bladder, initial encounter: Secondary | ICD-10-CM | POA: Diagnosis not present

## 2022-01-02 DIAGNOSIS — I10 Essential (primary) hypertension: Secondary | ICD-10-CM | POA: Diagnosis not present

## 2022-01-02 DIAGNOSIS — H918X9 Other specified hearing loss, unspecified ear: Secondary | ICD-10-CM | POA: Diagnosis not present

## 2022-01-02 DIAGNOSIS — R338 Other retention of urine: Secondary | ICD-10-CM

## 2022-01-02 DIAGNOSIS — Z87891 Personal history of nicotine dependence: Secondary | ICD-10-CM | POA: Insufficient documentation

## 2022-01-02 DIAGNOSIS — N411 Chronic prostatitis: Secondary | ICD-10-CM | POA: Diagnosis not present

## 2022-01-02 DIAGNOSIS — Z923 Personal history of irradiation: Secondary | ICD-10-CM | POA: Insufficient documentation

## 2022-01-02 DIAGNOSIS — N210 Calculus in bladder: Secondary | ICD-10-CM | POA: Insufficient documentation

## 2022-01-02 DIAGNOSIS — E291 Testicular hypofunction: Secondary | ICD-10-CM | POA: Diagnosis not present

## 2022-01-02 DIAGNOSIS — Z8546 Personal history of malignant neoplasm of prostate: Secondary | ICD-10-CM | POA: Insufficient documentation

## 2022-01-02 DIAGNOSIS — N35912 Unspecified bulbous urethral stricture, male: Secondary | ICD-10-CM | POA: Diagnosis not present

## 2022-01-02 DIAGNOSIS — Z79899 Other long term (current) drug therapy: Secondary | ICD-10-CM | POA: Diagnosis not present

## 2022-01-02 DIAGNOSIS — N281 Cyst of kidney, acquired: Secondary | ICD-10-CM | POA: Diagnosis not present

## 2022-01-02 DIAGNOSIS — N35919 Unspecified urethral stricture, male, unspecified site: Secondary | ICD-10-CM | POA: Diagnosis not present

## 2022-01-02 DIAGNOSIS — N211 Calculus in urethra: Secondary | ICD-10-CM | POA: Diagnosis not present

## 2022-01-02 DIAGNOSIS — Z9889 Other specified postprocedural states: Secondary | ICD-10-CM | POA: Insufficient documentation

## 2022-01-02 DIAGNOSIS — N4289 Other specified disorders of prostate: Secondary | ICD-10-CM | POA: Diagnosis not present

## 2022-01-02 DIAGNOSIS — N420 Calculus of prostate: Secondary | ICD-10-CM

## 2022-01-02 DIAGNOSIS — R569 Unspecified convulsions: Secondary | ICD-10-CM | POA: Insufficient documentation

## 2022-01-02 DIAGNOSIS — N401 Enlarged prostate with lower urinary tract symptoms: Secondary | ICD-10-CM | POA: Diagnosis not present

## 2022-01-02 DIAGNOSIS — N529 Male erectile dysfunction, unspecified: Secondary | ICD-10-CM | POA: Diagnosis not present

## 2022-01-02 DIAGNOSIS — N390 Urinary tract infection, site not specified: Secondary | ICD-10-CM | POA: Diagnosis not present

## 2022-01-02 HISTORY — DX: Hyperlipidemia, unspecified: E78.5

## 2022-01-02 HISTORY — DX: Personal history of other diseases of urinary system: Z87.448

## 2022-01-02 HISTORY — DX: Personal history of other diseases of the digestive system: Z87.19

## 2022-01-02 HISTORY — DX: Generalized abdominal pain: R10.84

## 2022-01-02 HISTORY — DX: Benign prostatic hyperplasia with lower urinary tract symptoms: N13.8

## 2022-01-02 HISTORY — DX: Cyst of kidney, acquired: N28.1

## 2022-01-02 HISTORY — DX: Calculus in bladder: N21.0

## 2022-01-02 HISTORY — DX: Migraine with aura, not intractable, without status migrainosus: G43.109

## 2022-01-02 HISTORY — DX: Personal history of other malignant neoplasm of skin: Z85.828

## 2022-01-02 HISTORY — PX: CYSTOSCOPY WITH LITHOLAPAXY: SHX1425

## 2022-01-02 HISTORY — DX: Personal history of other specified conditions: Z87.898

## 2022-01-02 HISTORY — DX: Localization-related (focal) (partial) symptomatic epilepsy and epileptic syndromes with complex partial seizures, not intractable, without status epilepticus: G40.209

## 2022-01-02 HISTORY — DX: Other chest pain: R07.89

## 2022-01-02 HISTORY — DX: Essential tremor: G25.0

## 2022-01-02 HISTORY — PX: TRANSURETHRAL RESECTION OF BLADDER TUMOR: SHX2575

## 2022-01-02 HISTORY — DX: Benign prostatic hyperplasia with lower urinary tract symptoms: N40.1

## 2022-01-02 HISTORY — DX: Gastro-esophageal reflux disease without esophagitis: K21.9

## 2022-01-02 LAB — POCT I-STAT, CHEM 8
BUN: 34 mg/dL — ABNORMAL HIGH (ref 8–23)
BUN: 57 mg/dL — ABNORMAL HIGH (ref 8–23)
Calcium, Ion: 1.28 mmol/L (ref 1.15–1.40)
Calcium, Ion: 1.17 mmol/L (ref 1.15–1.40)
Chloride: 106 mmol/L (ref 98–111)
Chloride: 107 mmol/L (ref 98–111)
Creatinine, Ser: 2 mg/dL — ABNORMAL HIGH (ref 0.61–1.24)
Creatinine, Ser: 2.1 mg/dL — ABNORMAL HIGH (ref 0.61–1.24)
Glucose, Bld: 105 mg/dL — ABNORMAL HIGH (ref 70–99)
Glucose, Bld: 105 mg/dL — ABNORMAL HIGH (ref 70–99)
HCT: 41 % (ref 39.0–52.0)
HCT: 41 % (ref 39.0–52.0)
Hemoglobin: 13.9 g/dL (ref 13.0–17.0)
Hemoglobin: 13.9 g/dL (ref 13.0–17.0)
Potassium: 4.3 mmol/L (ref 3.5–5.1)
Potassium: 6.4 mmol/L (ref 3.5–5.1)
Sodium: 140 mmol/L (ref 135–145)
Sodium: 137 mmol/L (ref 135–145)
TCO2: 24 mmol/L (ref 22–32)
TCO2: 26 mmol/L (ref 22–32)

## 2022-01-02 SURGERY — CYSTOSCOPY, WITH BLADDER CALCULUS LITHOLAPAXY
Anesthesia: General | Site: Prostate

## 2022-01-02 MED ORDER — IOHEXOL 300 MG/ML  SOLN
INTRAMUSCULAR | Status: DC | PRN
  Administered 2022-01-02: 20 mL

## 2022-01-02 MED ORDER — TRAMADOL HCL 50 MG PO TABS: 50.0000 mg | ORAL_TABLET | Freq: Four times a day (QID) | ORAL | 0 refills | Status: DC | PRN

## 2022-01-02 MED ORDER — FENTANYL CITRATE (PF) 100 MCG/2ML IJ SOLN
INTRAMUSCULAR | Status: AC
  Filled 2022-01-02: qty 2

## 2022-01-02 MED ORDER — LACTATED RINGERS IV SOLN: INTRAVENOUS | Status: DC

## 2022-01-02 MED ORDER — OXYCODONE HCL 5 MG/5ML PO SOLN: 5.0000 mg | Freq: Once | ORAL | Status: DC | PRN

## 2022-01-02 MED ORDER — CIPROFLOXACIN IN D5W 400 MG/200ML IV SOLN
INTRAVENOUS | Status: AC
  Filled 2022-01-02: qty 200

## 2022-01-02 MED ORDER — FENTANYL CITRATE (PF) 100 MCG/2ML IJ SOLN: 25.0000 ug | INTRAMUSCULAR | Status: DC | PRN

## 2022-01-02 MED ORDER — FENTANYL CITRATE (PF) 100 MCG/2ML IJ SOLN
INTRAMUSCULAR | Status: DC | PRN
  Administered 2022-01-02: 50 ug via INTRAVENOUS
  Administered 2022-01-02 (×3): 25 ug via INTRAVENOUS

## 2022-01-02 MED ORDER — ACETAMINOPHEN 160 MG/5ML PO SOLN: 1000.0000 mg | Freq: Once | ORAL | Status: DC | PRN

## 2022-01-02 MED ORDER — EPHEDRINE SULFATE-NACL 50-0.9 MG/10ML-% IV SOSY
PREFILLED_SYRINGE | INTRAVENOUS | Status: DC | PRN
  Administered 2022-01-02: 5 mg via INTRAVENOUS
  Administered 2022-01-02: 10 mg via INTRAVENOUS

## 2022-01-02 MED ORDER — SODIUM CHLORIDE 0.9 % IR SOLN
Status: DC | PRN
Start: 2022-01-02 — End: 2022-01-02
  Administered 2022-01-02: 9000 mL via INTRAVESICAL

## 2022-01-02 MED ORDER — OXYCODONE HCL 5 MG PO TABS: 5.0000 mg | ORAL_TABLET | Freq: Once | ORAL | Status: DC | PRN

## 2022-01-02 MED ORDER — ONDANSETRON HCL 4 MG/2ML IJ SOLN
INTRAMUSCULAR | Status: AC
  Filled 2022-01-02: qty 2

## 2022-01-02 MED ORDER — DEXAMETHASONE SODIUM PHOSPHATE 10 MG/ML IJ SOLN
INTRAMUSCULAR | Status: AC
  Filled 2022-01-02: qty 1

## 2022-01-02 MED ORDER — DEXAMETHASONE SODIUM PHOSPHATE 10 MG/ML IJ SOLN
INTRAMUSCULAR | Status: DC | PRN
  Administered 2022-01-02: 5 mg via INTRAVENOUS

## 2022-01-02 MED ORDER — PROPOFOL 10 MG/ML IV BOLUS
INTRAVENOUS | Status: AC
Start: 2022-01-02 — End: ?
  Filled 2022-01-02: qty 20

## 2022-01-02 MED ORDER — ACETAMINOPHEN 500 MG PO TABS: 1000.0000 mg | ORAL_TABLET | Freq: Once | ORAL | Status: DC | PRN

## 2022-01-02 MED ORDER — PROPOFOL 10 MG/ML IV BOLUS
INTRAVENOUS | Status: DC | PRN
  Administered 2022-01-02: 120 mg via INTRAVENOUS

## 2022-01-02 MED ORDER — LIDOCAINE HCL (PF) 2 % IJ SOLN
INTRAMUSCULAR | Status: AC
  Filled 2022-01-02: qty 5

## 2022-01-02 MED ORDER — ONDANSETRON HCL 4 MG/2ML IJ SOLN
INTRAMUSCULAR | Status: DC | PRN
  Administered 2022-01-02: 4 mg via INTRAVENOUS

## 2022-01-02 MED ORDER — ACETAMINOPHEN 10 MG/ML IV SOLN: 1000.0000 mg | Freq: Once | INTRAVENOUS | Status: DC | PRN

## 2022-01-02 MED ORDER — MIDAZOLAM HCL 2 MG/2ML IJ SOLN
INTRAMUSCULAR | Status: AC
  Filled 2022-01-02: qty 2

## 2022-01-02 MED ORDER — LIDOCAINE 2% (20 MG/ML) 5 ML SYRINGE
INTRAMUSCULAR | Status: DC | PRN
  Administered 2022-01-02: 50 mg via INTRAVENOUS

## 2022-01-02 MED ORDER — PROPOFOL 10 MG/ML IV BOLUS
INTRAVENOUS | Status: AC
  Filled 2022-01-02: qty 20

## 2022-01-02 MED ORDER — CIPROFLOXACIN IN D5W 400 MG/200ML IV SOLN
400.0000 mg | INTRAVENOUS | Status: AC
  Administered 2022-01-02: 400 mg via INTRAVENOUS

## 2022-01-02 MED ORDER — EPHEDRINE 5 MG/ML INJ
INTRAVENOUS | Status: AC
  Filled 2022-01-02: qty 5

## 2022-01-02 SURGICAL SUPPLY — 36 items
BAG DRAIN URO-CYSTO SKYTR STRL (DRAIN) ×2 IMPLANT
BAG DRN RND TRDRP ANRFLXCHMBR (UROLOGICAL SUPPLIES)
BAG DRN UROCATH (DRAIN) ×1
BAG URINE DRAIN 2000ML AR STRL (UROLOGICAL SUPPLIES) IMPLANT
BALLN NEPHROSTOMY (BALLOONS) ×2
BALLOON NEPHROSTOMY (BALLOONS) IMPLANT
BASKET ZERO TIP NITINOL 2.4FR (BASKET) IMPLANT
BSKT STON RTRVL ZERO TP 2.4FR (BASKET)
CATH FOLEY 2WAY SLVR  5CC 18FR (CATHETERS)
CATH FOLEY 2WAY SLVR 5CC 18FR (CATHETERS) IMPLANT
CATH URET 5FR 28IN OPEN ENDED (CATHETERS) ×2 IMPLANT
CLOTH BEACON ORANGE TIMEOUT ST (SAFETY) ×2 IMPLANT
DRSG TEGADERM 4X4.75 (GAUZE/BANDAGES/DRESSINGS) IMPLANT
ELECT REM PT RETURN 9FT ADLT (ELECTROSURGICAL) ×2
ELECTRODE REM PT RTRN 9FT ADLT (ELECTROSURGICAL) ×1 IMPLANT
EXTRACTOR STONE 1.7FRX115CM (UROLOGICAL SUPPLIES) IMPLANT
FIBER LASER FLEXIVA 550 (UROLOGICAL SUPPLIES) ×1 IMPLANT
GLOVE SURG ENC MOIS LTX SZ6.5 (GLOVE) ×2 IMPLANT
GOWN STRL REUS W/TWL LRG LVL3 (GOWN DISPOSABLE) ×2 IMPLANT
GUIDEWIRE STR DUAL SENSOR (WIRE) ×3 IMPLANT
HOLDER FOLEY CATH W/STRAP (MISCELLANEOUS) IMPLANT
IV NS 500ML (IV SOLUTION) ×2
IV NS 500ML BAXH (IV SOLUTION) IMPLANT
IV NS IRRIG 3000ML ARTHROMATIC (IV SOLUTION) ×3 IMPLANT
KIT TURNOVER CYSTO (KITS) ×2 IMPLANT
LOOP CUT BIPOLAR 24F LRG (ELECTROSURGICAL) IMPLANT
MANIFOLD NEPTUNE II (INSTRUMENTS) ×2 IMPLANT
PACK CYSTO (CUSTOM PROCEDURE TRAY) ×2 IMPLANT
SYR TOOMEY IRRIG 70ML (MISCELLANEOUS) ×4
SYRINGE TOOMEY IRRIG 70ML (MISCELLANEOUS) ×1 IMPLANT
TRACTIP FLEXIVA PULS ID 200XHI (Laser) IMPLANT
TRACTIP FLEXIVA PULSE ID 200 (Laser)
TUBE CONNECTING 12X1/4 (SUCTIONS) ×2 IMPLANT
TUBING UROLOGY SET (TUBING) ×2 IMPLANT
WATER STERILE IRR 500ML POUR (IV SOLUTION) ×1 IMPLANT
ultraxx nephrostomy balloon ×1 IMPLANT

## 2022-01-02 NOTE — Anesthesia Postprocedure Evaluation (Signed)
Anesthesia Post Note ? ?Patient: Wesley [name] ? ?Procedure(s) Performed: CYSTOSCOPY WITH LITHOTRIPSY (Prostate) ?TRANSURETHRAL RESECTION OF BLADDER TUMOR (TURBT) (Prostate) ? ?  ? ?Patient location during evaluation: PACU ?Anesthesia Type: General ?Level of consciousness: awake and alert ?Pain management: pain level controlled ?Vital Signs Assessment: post-procedure vital signs reviewed and stable ?Respiratory status: spontaneous breathing, nonlabored ventilation, respiratory function stable and patient connected to nasal cannula oxygen ?Cardiovascular status: blood pressure returned to baseline and stable ?Postop Assessment: no apparent nausea or vomiting ?Anesthetic complications: no ? ? ?No notable events documented. ? ?Last Vitals:  ?Vitals:  ? 01/02/22 0930 01/02/22 1013  ?BP: 120/74 (!) 148/69  ?Pulse: 67 76  ?Resp: (!) 8 16  ?Temp:  36.4 ?C  ?SpO2: 100% 99%  ?  ?Last Pain:  ?Vitals:  ? 01/02/22 1040  ?TempSrc:   ?PainSc: 0-No pain  ? ? ?  ?  ?  ?  ?  ?  ? ?Amberlee Garvey ? ? ? ? ?

## 2022-01-02 NOTE — Interval H&P Note (Signed)
History and Physical Interval Note: ? ?01/02/2022 ?7:23 AM ? ?Wesley [name]  has presented today for surgery, with the diagnosis of BLADDER CALCULI.  The various methods of treatment have been discussed with the patient and family. After consideration of risks, benefits and other options for treatment, the patient has consented to  Procedure(s): ?CYSTOSCOPY WITH LITHOTRIPSY (N/A) ?TRANSURETHRAL RESECTION OF BLADDER TUMOR (TURBT) (N/A) as a surgical intervention.  The patient's history has been reviewed, patient examined, no change in status, stable for surgery.  I have reviewed the patient's chart and labs.  Questions were answered to the patient's satisfaction.   ? ? ?Wesley [name] D Aarion Kittrell ? ? ?

## 2022-01-02 NOTE — H&P (Signed)
CC/HPI: cc: Work-in for possible urinary retention  ? ?Prostate Cancer - patient's PSA on 04/21/2020 is 0.015. He was treated with brachytherapy and external beam.  ?Renal cyst - repeat MRI shows Bosniak 2 renal cyst. No further surveillance imaging needed.  ?ED - patient uses sildenafil which helps. He will let me know when he needs a refill.  ?BPH - patient using tamsulosin and finasteride without difficulty. His urinary symptoms are stable.  ?Low testosterone - patient uses testosterone replacement and most recent testosterone levels were in the 500s.  ? ? ?03/03/21: 71 year old man who underwent cystoscopy with bladder biopsy, urethral balloon dilation 08/2020. Patient comes in today as a work-in for urinary retention. Over the last few weeks he has only been able to void a few drops at night and history of progressively improved during the course the day. His PVR in the office today is 124 mL. He denies any hematuria. Urinalysis today is consistent with infection.  ? ?03/10/21: The patient states symptoms have not significantly improved since taking antibiotics. His urinalysis today continues to show bacteria.  ? ?04/20/21: 71 year old man with history of recurrent urethral stricture status post urethral balloon dilation 08/2020 and 03/2021 returns for follow-up. He has been self catheterizing 3 times a day to keep the stricture open. His urinary incontinence has improved. He was recently notified that his insurance company will no longer cover [place]. He continues to take tamsulosin at night.  ? ?11/28/20: 71 year old man with a history of prostate cancer treated with XRT and bulbar urethral stricture last dilated in June 2022 comes in with a day hx of fever and slight discomfort when removing catheter. He catheterizes 1 time a day to keep stricture open. He noticed fever to be 101.5 yesterday and felt terrible by midday. Urinalysis today consistent with infection.  ? ?12/14/21: 71 year old man with a history of  prostate cancer treated with XRT as well as bulbar urethral stricture dilated in June 2022 comes in for cystoscopy after having febrile UTI. He is feeling better but it has taken a long time for energy to return. His urine culture showed multiple drug resistance.  ? ?  ?ALLERGIES: Sulfa Drugs ?  ? ?MEDICATIONS: Androgel 20.25 mg/1.25 gram per actuation (1.62 %) gel in metered-dose pump  ?Finasteride 5 mg tablet 1 tablet PO Daily  ?Sildenafil Citrate 20 mg tablet 1-2 tablet PO PRN  ?Tamsulosin Hcl 0.4 mg capsule 1 capsule PO Q HS  ?Amlodipine Besylate 5 mg tablet Oral  ?Ciclopirox 0.77 % cream External  ?Dicyclomine Hcl 20 mg tablet  ?Doxepin Hcl 50 mg capsule  ?LevETIRAcetam 500 MG Oral Tablet Oral  ?Oxtellar Xr 600 mg tablet, extended release 24 hr Oral  ?Stelara  ?Trospium Chloride Er 60 mg capsule, ext release 24 hr 1 capsule PO Daily  ?  ? ?GU PSH: Cysto Bladder Stone <2.5cm - 08/23/2020, 2020 ?Cysto Bladder Ureth Biopsy - 2013 ?Cysto Dilate Stricture (M or F) - 03/13/2021, 08/23/2020, 2020 ?Cystoscopy - 03/10/2021, 08/11/2020, 2020 ?Cystoscopy TURBT <2 cm - 08/23/2020 ?Cystoscopy Ureteroscopy - 2010 ?Locm 300-'399Mg'$ /Ml Iodine,1Ml - 10/05/2019, 2020 ?TRANSPERI NEEDLE PLACE, PROS - 2015 ? ?  ?   ?PSH Notes: Inguinal Hernia Repair, Surgery Prostate Transperineal Placement Of Needles, Cystoscopy With Biopsy, Craniotomy Suboccipital Excision Of Meningioma, Cystoscopy With Ureteroscopy Left, Arthroscopy Knee Right  ? ?NON-GU PSH: Knee Arthroscopy; Dx - 2009 ?Remove Brain Lining Lesion - 2011 ? ?  ? ?GU PMH: Acute Cystitis/UTI - 11/28/2021 ?Bulbar urethral stricture - 11/28/2021, - 04/20/2021, - 03/28/2021, -  03/16/2021, - 03/10/2021, Patient is voiding well without difficulty. Will repeat PVR next visit., - 12/22/2020, - 09/13/2020, - 08/26/2020, Based on cystoscopy today would like to proceed with cystoscopy, balloon dilation of urethral stricture, and laser lithotripsy of stones. I discussed the risks and benefits of the procedure  including but not limited to pain, bleeding, recurrence of stricture, damage to surrounding structures including urethra and bladder, need for Foley catheter or additional procedures after this. Patient understands and wishes to proceed., - 08/11/2020, I discovered a moderate bulbar urethral stricture that would not allow passage of the flexible cystoscope even with pressure and I did not want to cause him any discomfort in the office since I am going to have to take care of his bladder calculi as well so we discussed performing dilation at the time of his cystolitholapaxy., - 2020 ?Urinary Urgency - 04/20/2021, - 09/13/2020 (Worsening), He uses DDAVP at night but still is having some nocturia and also is having some urgency and therefore I have recommended a trial of Toviaz to see if he notes this helps any better than the Myrbetriq., - 2019 ?Weak Urinary Stream - 04/20/2021, - 03/03/2021 ?History of prostate cancer - 03/16/2021, Repeat PSA in July 2022, - 12/22/2020, - 09/13/2020, He has nothing to suggest recurrence with no abnormality on DRE and a PSA that remains undetectable., - 2021 (Stable), Although he has a history of prostate cancer treated with radiation I told him I was not particularly concerned about the possibility of this being secondary to prostate cancer but rather more likely secondary to the effects of radiation., - 2020 (Stable), His prostate cancer remains under excellent control with no abnormality noted on DRE and PSA that remains undetectable., - 2019 (Stable), His erectile dysfunction is likely multifactorial but most likely secondary to the treatment of his prostate cancer with radiation. Also up with me as previously scheduled for his annual prostate cancer follow-up appointment, - 2019 (Stable), At this point it appears his prostate cancer has been well controlled with a PSA that is currently 0.017. I will continue to see him on a yearly basis., - 2018 (Stable), His prostate remains flat and  benign to examination. His PSA remains undetectable. I will continue to see him on a yearly basis for DRE and PSA., - 2017, History of prostate cancer, - 2016 ?Incomplete bladder emptying - 03/03/2021 ?BPH w/LUTS (Stable), Patient to continue tamsulosin and finasteride. - 12/22/2020, Benign prostatic hyperplasia with urinary obstruction, - 2015 ?Microscopic hematuria, Patient had gross hematuria workup late last year. Will continue to monitor this. - 12/22/2020 ?Nocturia - 12/22/2020, - 09/13/2020, Nocturia, - 2016 ?Renal cyst, Right, No further imaging necessary for simple cyst - 12/22/2020, Right, Has Bosniak 2 F cysts. It was suggested that further characterization may be attained with an MRI scan so I will schedule him for that in 6 months., - 2021, Renal cysts, acquired, bilateral, - 2014 ?Gross hematuria - 09/13/2020, He is experienced gross hematuria and has had previous ionizing radiation and therefore a full evaluation will be undertaken. He has had a history of stones as well so I will obtain a CT scan to evaluate the upper tract and then he will return for completion of his workup with cystoscopy., - 2020 ?Calculus of LUT, Unspec - 08/26/2020, - 08/11/2020 ?Primary hypogonadism, This is managed with AndroGel 3 pumps. He will remain on this. - 2021, (Stable), We discussed the fact that his serum testosterone level was found to be above the reference range. He felt  that that was the day that he used to of the packets so what I have recommended is he decreased the dosage down to 1 packet daily., - 2019, Hypogonadism, testicular, - 2016 ?Bladder Stone, The source of his gross hematuria is almost certainly the stones located within his bladder on CT scan. I was not able to visualize these due to his stricture. - 2020 ?Renal calculus, Right, He has nonobstructing right renal calculi. I do not feel that this in any way contributed to his gross hematuria - 2020 ?ED due to arterial insufficiency (Worsening), He is going  to use sildenafil 20 mg titrating up to 100 mg as needed. - 2019 ?Urinary Frequency, He is having some new frequency and his nocturia has increased as well. He is using tamsulosin and also Celebrex which res

## 2022-01-02 NOTE — Transfer of Care (Signed)
Immediate Anesthesia Transfer of Care Note ? ?Patient: Wesley [name] ? ?Procedure(s) Performed: Procedure(s) (LRB): ?CYSTOSCOPY WITH LITHOTRIPSY (N/A) ?TRANSURETHRAL RESECTION OF BLADDER TUMOR (TURBT) (N/A) ? ?Patient Location: PACU ? ?Anesthesia Type: General ? ?Level of Consciousness: awake, oriented, sedated and patient cooperative ? ?Airway & Oxygen Therapy: Patient Spontanous Breathing and Patient connected to face mask oxygen ? ?Post-op Assessment: Report given to PACU RN and Post -op Vital signs reviewed and stable ? ?Post vital signs: Reviewed and stable ? ?Complications: No apparent anesthesia complications ?Last Vitals:  ?Vitals Value Taken Time  ?BP    ?Temp    ?Pulse 88 01/02/22 0851  ?Resp 23 01/02/22 0851  ?SpO2 98 % 01/02/22 0851  ?Vitals shown include unvalidated device data. ? ?Last Pain:  ?Vitals:  ? 01/02/22 0633  ?TempSrc: Oral  ?PainSc: 0-No pain  ?   ? ?Patients Stated Pain Goal: 8 (01/02/22 9390) ? ?Complications: No notable events documented. ?

## 2022-01-02 NOTE — Anesthesia Procedure Notes (Signed)
Procedure Name: LMA Insertion ?Date/Time: 01/02/2022 7:59 AM ?Performed by: Suan Halter, CRNA ?Pre-anesthesia Checklist: Patient identified, Emergency Drugs available, Suction available and Patient being monitored ?Patient Re-evaluated:Patient Re-evaluated prior to induction ?Oxygen Delivery Method: Circle system utilized ?Preoxygenation: Pre-oxygenation with 100% oxygen ?Induction Type: IV induction ?Ventilation: Mask ventilation without difficulty ?LMA: LMA inserted ?LMA Size: 4.0 ?Number of attempts: 1 ?Airway Equipment and Method: Bite block ?Placement Confirmation: positive ETCO2 ?Tube secured with: Tape ?Dental Injury: Teeth and Oropharynx as per pre-operative assessment  ? ? ? ? ?

## 2022-01-02 NOTE — Anesthesia Preprocedure Evaluation (Addendum)
Anesthesia Evaluation  ?Patient identified by MRN, date of birth, ID band ?Patient awake ? ? ? ?Reviewed: ?Allergy & Precautions, NPO status , Patient's Chart, lab work & pertinent test results ? ?History of Anesthesia Complications ?Negative for: history of anesthetic complications ? ?Airway ?Mallampati: I ? ?TM Distance: >3 FB ?Neck ROM: Full ? ? ? Dental ? ?(+) Teeth Intact, Dental Advisory Given ?  ?Pulmonary ?neg shortness of breath, neg sleep apnea, neg COPD, neg recent URI, former smoker,  ?  ?breath sounds clear to auscultation ? ? ? ? ? ? Cardiovascular ?hypertension, Pt. on medications ? ?Rhythm:Regular  ? ?  ?Neuro/Psych ? Headaches, Seizures -, Well Controlled,  benign right temperal lobe meningioma s/p resection  (residual hearing loss)    ?negative psych ROS  ? GI/Hepatic ?02-11-2017  s/p lap paraesophageal hernia repair w/ nissen fundoplication ?  ?Endo/Other  ?negative endocrine ROS ? Renal/GU ?Renal diseaseLab Results ?     Component                Value               Date                 ?     CREATININE               1.03                03/13/2021           ?  ? ?03-04-2009  s/p radiactive prostate seed implants and external beam radiation ? ?  ?Musculoskeletal ?negative musculoskeletal ROS ?(+)  ? Abdominal ?  ?Peds ? Hematology ?negative hematology ROS ?(+) Lab Results ?     Component                Value               Date                 ?     WBC                      5.8                 03/13/2021           ?     HGB                      16.5                03/13/2021           ?     HCT                      50.1                03/13/2021           ?     MCV                      100.0               03/13/2021           ?     PLT                      187  03/13/2021           ?   ?Anesthesia Other Findings ? ? Reproductive/Obstetrics ? ?  ? ? ? ? ? ? ? ? ? ? ? ? ? ?  ?  ? ? ? ? ? ? ? ?Anesthesia Physical ?Anesthesia Plan ? ?ASA: 2 ? ?Anesthesia  Plan: General  ? ?Post-op Pain Management: Minimal or no pain anticipated  ? ?Induction: Intravenous ? ?PONV Risk Score and Plan: 2 and Ondansetron and Dexamethasone ? ?Airway Management Planned: LMA and Oral ETT ? ?Additional Equipment: None ? ?Intra-op Plan:  ? ?Post-operative Plan: Extubation in OR ? ?Informed Consent: I have reviewed the patients History and Physical, chart, labs and discussed the procedure including the risks, benefits and alternatives for the proposed anesthesia with the patient or authorized representative who has indicated his/her understanding and acceptance.  ? ? ? ?Dental advisory given ? ?Plan Discussed with: CRNA and Anesthesiologist ? ?Anesthesia Plan Comments:   ? ? ? ? ? ? ?Anesthesia Quick Evaluation ? ?

## 2022-01-02 NOTE — Discharge Instructions (Addendum)
Post Bladder Surgery Instructions ? ? ?General instructions: ?   ? Your recent bladder surgery requires very little post hospital care but some definite precautions. ? ?Despite the fact that no skin incisions were used, the area around the bladder incisions are raw and covered with scabs to promote healing and prevent bleeding. Certain precautions are needed to insure that the scabs are not disturbed over the next 2-4 weeks while the healing proceeds. ? ?Because the raw surface inside your bladder and the irritating effects of urine you may expect frequency of urination and/or urgency (a stronger desire to urinate) and perhaps even getting up at night more often. This will usually resolve or improve slowly over the healing period. You may see some blood in your urine over the first 6 weeks. Do not be alarmed, even if the urine was clear for a while. Get off your feet and drink lots of fluids until clearing occurs. If you start to pass clots or don't improve call us. ? ?Catheter: (If you are discharged with a catheter.) ? ?1. Keep your catheter secured to your leg at all times with tape or the supplied strap. ?2. You may experience leakage of urine around your catheter- as long as the  ?catheter continues to drain, this is normal.  If your catheter stops draining  ?go to the ER. ?3. You may also have blood in your urine, even after it has been clear for  ?several days; you may even pass some small blood clots or other material.  This  ?is normal as well.  If this happens, sit down and drink plenty of water to help  ?make urine to flush out your bladder.  If the blood in your urine becomes worse  ?after doing this, contact our office or return to the ER. ?4. You may use the leg bag (small bag) during the day, but use the large bag at  ?night. ? ?Diet: ? ?You may return to your normal diet immediately. Because of the raw surface of your bladder, alcohol, spicy foods, foods high in acid and drinks with caffeine may  cause irritation or frequency and should be used in moderation. To keep your urine flowing freely and avoid constipation, drink plenty of fluids during the day (8-10 glasses). Tip: Avoid cranberry juice because it is very acidic. ? ?Activity: ? ?Your physical activity doesn't need to be restricted. However, if you are very active, you may see some blood in the urine. We suggest that you reduce your activity under the circumstances until the bleeding has stopped. ? ?Bowels: ? ?It is important to keep your bowels regular during the postoperative period. Straining with bowel movements can cause bleeding. A bowel movement every other day is reasonable. Use a mild laxative if needed, such as milk of magnesia 2-3 tablespoons, or 2 Dulcolax tablets. Call if you continue to have problems. If you had been taking narcotics for pain, before, during or after your surgery, you may be constipated. Take a laxative if necessary. ? ? ? ?Medication: ? ?You should resume your pre-surgery medications unless told not to. In addition you may be given an antibiotic to prevent or treat infection. Antibiotics are not always necessary. All medication should be taken as prescribed until the bottles are finished unless you are having an unusual reaction to one of the drugs. ? ? ?Post Anesthesia Home Care Instructions ? ?Activity: ?Get plenty of rest for the remainder of the day. A responsible individual must stay with you for  24 hours following the procedure.  ?For the next 24 hours, DO NOT: ?-Drive a car ?-Paediatric nurse ?-Drink alcoholic beverages ?-Take any medication unless instructed by your physician ?-Make any legal decisions or sign important papers. ? ?Meals: ?Start with liquid foods such as gelatin or soup. Progress to regular foods as tolerated. Avoid greasy, spicy, heavy foods. If nausea and/or vomiting occur, drink only clear liquids until the nausea and/or vomiting subsides. Call your physician if vomiting  continues. ? ?Special Instructions/Symptoms: ?Your throat may feel dry or sore from the anesthesia or the breathing tube placed in your throat during surgery. If this causes discomfort, gargle with warm salt water. The discomfort should disappear within 24 hours. ? ?Dr. Keane Scrape office will call you to schedule appointment on Friday to remove the bladder catheter. ?    ? ? ?

## 2022-01-02 NOTE — Op Note (Signed)
Operative Note ? ?Preoperative diagnosis:  ?1.  Bladder/prostate calculi ? ? ?Postoperative diagnosis: ?1.  Bladder/prostate calculi ?2.  Posterior urethral stricture ?3.  Extruded brachytherapy seeds ? ?Procedure(s): ?1.  Cystolithotripsy ?2.  Urethral balloon dilation ?3.  Transurethral resection of bladder tumor ? ?Surgeon: Jacalyn Lefevre, MD ? ?Assistants:  None ? ?Anesthesia:  General ? ?Complications:  None ? ?EBL:  minimal ? ?Specimens: ?1. Prostate chips ? ?Drains/Catheters: ?1.  22Fr 2 way foley ? ?Intraoperative findings:   ?Normal anterior urethra ?Prostatic urethra pale and fixed - unable to traverse 23Fr rigid cystoscope ?Calculi adherent circumferentially at bladder neck/proximal prostatic urethra ?No bladder masses or abnormal mucosa ?Extruded brachytherapy seeds at bladder neck  ? ?Indication:  Wesley [name] is a 71 y.o. male with history of prostate cancer treated with brachytherapy also with history of known anterior urethral stricture.  Patient with persistent hematuria and UTI prompting cystoscopy which revealed circumferential calculi at bladder neck/proximal prostatic urethra. ? ?Description of procedure: ?After risks and benefits were discussed with the patient, informed consent was obtained.  The patient was taken to the OR and placed in the supine position.  Anesthesia was induced and antibiotics were administered.  The patient was prepped and draped in the usual sterile fashion. A time out was performed. ? ?A 23Fr rigid cystoscope was placed in the urethral meatus and advanced until resistance was met in the prostatic urethra.  The scope was unable to traverse this area due to the prostate being fixed.  A 0.38 sensor wire was placed through the cystoscope and advanced into the bladder.  The cystoscope was removed.  Next, the ultraxx balloon dilator was placed over the wire and aligned over the narrowed prostatic urethra.  The balloon was inflated to 15 mm of Hg. After 1 minute the  balloon was deflated.  The balloon was removed taking care to keep the wire in place. ? ?The 89 French rigid cystoscope was then placed alongside the wire and advanced into the bladder under direct visualization.  Diagnostic cystoscopy took place.  There is no evidence of bladder tumors.  Patient had mild trabeculation.  Ureteral orifices were noted to be very close to the bladder neck.  Patient had circumferential calculi adherent to bladder neck/proximal prostatic urethra. ? ?A 550 ?m laser fiber was then advanced through the cystoscope.  Lithotripsy of the adherent calculi at the bladder neck was started until the stones were released.  ? ?Next the cystoscope was removed and the resectoscope using the nonvisual obturator was placed in the urethral meatus and advanced in the bladder.  The working element was then assembled and the obturator was removed.  Bipolar electrocautery was then used to gently further disrupt the stones from the bladder neck and spot cauterize any bleeding.  At this point time approximately 4 brachytherapy seeds were seen that had eroded into the bladder neck.  They were removed via the [facility] syringe.  The loop was then used to excise a small amount of lateral prostatic urethra to allow for further movement of the scope.  Hemostasis was achieved with cautery.  All prostate chips and stones were removed using the [place] syringe. ? ?Hemostasis was adequate with the irrigant turned off.  Further evaluation revealed no further stone.  The resectoscope was removed.  A 22 French Foley catheter with 10 cc sterile water was then placed.  The patient was awakened from anesthesia and change the PACU in stable condition. ? ? ?Plan:  Plan to remove foley in  3 days   ?

## 2022-01-03 ENCOUNTER — Encounter (HOSPITAL_BASED_OUTPATIENT_CLINIC_OR_DEPARTMENT_OTHER): Payer: Self-pay | Admitting: Urology

## 2022-01-03 LAB — SURGICAL PATHOLOGY

## 2022-01-06 HISTORY — PX: COLONOSCOPY: SHX174

## 2022-01-09 DIAGNOSIS — K5150 Left sided colitis without complications: Secondary | ICD-10-CM | POA: Diagnosis not present

## 2022-01-09 DIAGNOSIS — K5730 Diverticulosis of large intestine without perforation or abscess without bleeding: Secondary | ICD-10-CM | POA: Diagnosis not present

## 2022-01-09 DIAGNOSIS — K648 Other hemorrhoids: Secondary | ICD-10-CM | POA: Diagnosis not present

## 2022-01-09 DIAGNOSIS — F419 Anxiety disorder, unspecified: Secondary | ICD-10-CM | POA: Diagnosis not present

## 2022-01-09 DIAGNOSIS — K6289 Other specified diseases of anus and rectum: Secondary | ICD-10-CM | POA: Diagnosis not present

## 2022-01-09 DIAGNOSIS — K6389 Other specified diseases of intestine: Secondary | ICD-10-CM | POA: Diagnosis not present

## 2022-01-16 DIAGNOSIS — N35011 Post-traumatic bulbous urethral stricture: Secondary | ICD-10-CM | POA: Diagnosis not present

## 2022-01-24 DIAGNOSIS — R339 Retention of urine, unspecified: Secondary | ICD-10-CM | POA: Diagnosis not present

## 2022-02-12 DIAGNOSIS — M25552 Pain in left hip: Secondary | ICD-10-CM | POA: Diagnosis not present

## 2022-02-15 DIAGNOSIS — M25552 Pain in left hip: Secondary | ICD-10-CM | POA: Diagnosis not present

## 2022-02-22 DIAGNOSIS — N3000 Acute cystitis without hematuria: Secondary | ICD-10-CM | POA: Diagnosis not present

## 2022-02-22 DIAGNOSIS — R8271 Bacteriuria: Secondary | ICD-10-CM | POA: Diagnosis not present

## 2022-03-02 DIAGNOSIS — N201 Calculus of ureter: Secondary | ICD-10-CM | POA: Diagnosis not present

## 2022-03-02 DIAGNOSIS — N132 Hydronephrosis with renal and ureteral calculous obstruction: Secondary | ICD-10-CM | POA: Diagnosis not present

## 2022-03-02 DIAGNOSIS — I700 Atherosclerosis of aorta: Secondary | ICD-10-CM | POA: Diagnosis not present

## 2022-03-02 DIAGNOSIS — M545 Low back pain, unspecified: Secondary | ICD-10-CM | POA: Diagnosis not present

## 2022-03-02 DIAGNOSIS — R310 Gross hematuria: Secondary | ICD-10-CM | POA: Diagnosis not present

## 2022-03-02 DIAGNOSIS — N281 Cyst of kidney, acquired: Secondary | ICD-10-CM | POA: Diagnosis not present

## 2022-03-06 ENCOUNTER — Other Ambulatory Visit: Payer: Self-pay | Admitting: Urology

## 2022-03-08 ENCOUNTER — Encounter (HOSPITAL_BASED_OUTPATIENT_CLINIC_OR_DEPARTMENT_OTHER): Payer: Self-pay | Admitting: Urology

## 2022-03-08 NOTE — Progress Notes (Signed)
Spoke w/ via phone for pre-op interview--- Wesley [name] needs dos----   NONE Current EKG in Epic dated 06/2022            Lab results------ COVID test -----patient states asymptomatic no test needed Arrive at -------1215 NPO after MN NO Solid Food.  Clear liquids from MN until---1115 Med rec completed Medications to take morning of surgery ----- Keppra and [place] Diabetic medication ----- Patient instructed no nail polish to be worn day of surgery Patient instructed to bring photo id and insurance card day of surgery Patient aware to have Driver (ride ) / caregiver Wesley [name]    for 24 hours after surgery  Patient Special Instructions ----- Pre-Op special Istructions ----- Patient verbalized understanding of instructions that were given at this phone interview. Patient denies shortness of breath, chest pain, fever, cough at this phone interview.

## 2022-03-13 ENCOUNTER — Ambulatory Visit (HOSPITAL_BASED_OUTPATIENT_CLINIC_OR_DEPARTMENT_OTHER): Payer: Medicare Other | Admitting: Anesthesiology

## 2022-03-13 ENCOUNTER — Encounter (HOSPITAL_BASED_OUTPATIENT_CLINIC_OR_DEPARTMENT_OTHER): Payer: Self-pay | Admitting: Urology

## 2022-03-13 ENCOUNTER — Ambulatory Visit (HOSPITAL_BASED_OUTPATIENT_CLINIC_OR_DEPARTMENT_OTHER)
Admission: RE | Admit: 2022-03-13 | Discharge: 2022-03-13 | Disposition: A | Discharge status: Home / Self Care | Payer: Medicare Other | Attending: Urology | Admitting: Urology

## 2022-03-13 ENCOUNTER — Encounter (HOSPITAL_BASED_OUTPATIENT_CLINIC_OR_DEPARTMENT_OTHER)
Admission: RE | Disposition: A | Discharge status: Home / Self Care | Payer: Self-pay | Source: Home / Self Care | Attending: Urology

## 2022-03-13 DIAGNOSIS — Z8719 Personal history of other diseases of the digestive system: Secondary | ICD-10-CM | POA: Diagnosis not present

## 2022-03-13 DIAGNOSIS — N202 Calculus of kidney with calculus of ureter: Secondary | ICD-10-CM | POA: Insufficient documentation

## 2022-03-13 DIAGNOSIS — Z79899 Other long term (current) drug therapy: Secondary | ICD-10-CM | POA: Insufficient documentation

## 2022-03-13 DIAGNOSIS — N201 Calculus of ureter: Secondary | ICD-10-CM | POA: Diagnosis not present

## 2022-03-13 DIAGNOSIS — Z923 Personal history of irradiation: Secondary | ICD-10-CM | POA: Diagnosis not present

## 2022-03-13 DIAGNOSIS — Z8546 Personal history of malignant neoplasm of prostate: Secondary | ICD-10-CM | POA: Insufficient documentation

## 2022-03-13 DIAGNOSIS — K279 Peptic ulcer, site unspecified, unspecified as acute or chronic, without hemorrhage or perforation: Secondary | ICD-10-CM | POA: Diagnosis not present

## 2022-03-13 DIAGNOSIS — N400 Enlarged prostate without lower urinary tract symptoms: Secondary | ICD-10-CM | POA: Diagnosis not present

## 2022-03-13 DIAGNOSIS — Z8711 Personal history of peptic ulcer disease: Secondary | ICD-10-CM | POA: Insufficient documentation

## 2022-03-13 DIAGNOSIS — I10 Essential (primary) hypertension: Secondary | ICD-10-CM | POA: Insufficient documentation

## 2022-03-13 DIAGNOSIS — N289 Disorder of kidney and ureter, unspecified: Secondary | ICD-10-CM | POA: Diagnosis not present

## 2022-03-13 DIAGNOSIS — Z87891 Personal history of nicotine dependence: Secondary | ICD-10-CM | POA: Insufficient documentation

## 2022-03-13 HISTORY — PX: CYSTOSCOPY WITH RETROGRADE PYELOGRAM, URETEROSCOPY AND STENT PLACEMENT: SHX5789

## 2022-03-13 HISTORY — PX: HOLMIUM LASER APPLICATION: SHX5852

## 2022-03-13 LAB — POCT I-STAT, CHEM 8
BUN: 27 mg/dL — ABNORMAL HIGH (ref 8–23)
Calcium, Ion: 1.22 mmol/L (ref 1.15–1.40)
Chloride: 104 mmol/L (ref 98–111)
Creatinine, Ser: 1.6 mg/dL — ABNORMAL HIGH (ref 0.61–1.24)
Glucose, Bld: 95 mg/dL (ref 70–99)
HCT: 45 % (ref 39.0–52.0)
Hemoglobin: 15.3 g/dL (ref 13.0–17.0)
Potassium: 4.3 mmol/L (ref 3.5–5.1)
Sodium: 140 mmol/L (ref 135–145)
TCO2: 25 mmol/L (ref 22–32)

## 2022-03-13 SURGERY — CYSTOURETEROSCOPY, WITH RETROGRADE PYELOGRAM AND STENT INSERTION
Anesthesia: General | Site: Renal | Laterality: Right

## 2022-03-13 MED ORDER — LACTATED RINGERS IV SOLN: INTRAVENOUS | Status: DC

## 2022-03-13 MED ORDER — DEXAMETHASONE SODIUM PHOSPHATE 10 MG/ML IJ SOLN
INTRAMUSCULAR | Status: AC
  Filled 2022-03-13: qty 1

## 2022-03-13 MED ORDER — CEFAZOLIN SODIUM-DEXTROSE 2-4 GM/100ML-% IV SOLN
2.0000 g | INTRAVENOUS | Status: AC
  Administered 2022-03-13: 2 g via INTRAVENOUS

## 2022-03-13 MED ORDER — CEFAZOLIN SODIUM-DEXTROSE 2-4 GM/100ML-% IV SOLN
INTRAVENOUS | Status: AC
  Filled 2022-03-13: qty 100

## 2022-03-13 MED ORDER — ONDANSETRON HCL 4 MG/2ML IJ SOLN
INTRAMUSCULAR | Status: DC | PRN
  Administered 2022-03-13: 4 mg via INTRAVENOUS

## 2022-03-13 MED ORDER — TAMSULOSIN HCL 0.4 MG PO CAPS: 0.4000 mg | ORAL_CAPSULE | Freq: Every day | ORAL | 0 refills | Status: DC

## 2022-03-13 MED ORDER — LIDOCAINE 2% (20 MG/ML) 5 ML SYRINGE
INTRAMUSCULAR | Status: DC | PRN
  Administered 2022-03-13: 60 mg via INTRAVENOUS

## 2022-03-13 MED ORDER — PHENYLEPHRINE HCL (PRESSORS) 10 MG/ML IV SOLN
INTRAVENOUS | Status: DC | PRN
  Administered 2022-03-13 (×4): 80 ug via INTRAVENOUS

## 2022-03-13 MED ORDER — PHENYLEPHRINE 80 MCG/ML (10ML) SYRINGE FOR IV PUSH (FOR BLOOD PRESSURE SUPPORT)
PREFILLED_SYRINGE | INTRAVENOUS | Status: AC
  Filled 2022-03-13: qty 10

## 2022-03-13 MED ORDER — LIDOCAINE HCL (PF) 2 % IJ SOLN
INTRAMUSCULAR | Status: AC
  Filled 2022-03-13: qty 5

## 2022-03-13 MED ORDER — OXYCODONE HCL 5 MG PO TABS: 5.0000 mg | ORAL_TABLET | Freq: Once | ORAL | Status: DC | PRN

## 2022-03-13 MED ORDER — OXYCODONE HCL 5 MG/5ML PO SOLN: 5.0000 mg | Freq: Once | ORAL | Status: DC | PRN

## 2022-03-13 MED ORDER — PROPOFOL 10 MG/ML IV BOLUS
INTRAVENOUS | Status: DC | PRN
  Administered 2022-03-13: 20 mg via INTRAVENOUS
  Administered 2022-03-13: 30 mg via INTRAVENOUS
  Administered 2022-03-13: 100 mg via INTRAVENOUS
  Administered 2022-03-13: 150 mg via INTRAVENOUS

## 2022-03-13 MED ORDER — FENTANYL CITRATE (PF) 100 MCG/2ML IJ SOLN
INTRAMUSCULAR | Status: AC
  Filled 2022-03-13: qty 2

## 2022-03-13 MED ORDER — AMISULPRIDE (ANTIEMETIC) 5 MG/2ML IV SOLN: 10.0000 mg | Freq: Once | INTRAVENOUS | Status: DC | PRN

## 2022-03-13 MED ORDER — PROPOFOL 10 MG/ML IV BOLUS
INTRAVENOUS | Status: AC
  Filled 2022-03-13: qty 20

## 2022-03-13 MED ORDER — IOHEXOL 300 MG/ML  SOLN
INTRAMUSCULAR | Status: DC | PRN
  Administered 2022-03-13: 5 mL

## 2022-03-13 MED ORDER — SODIUM CHLORIDE 0.9 % IR SOLN
Status: DC | PRN
  Administered 2022-03-13: 3000 mL

## 2022-03-13 MED ORDER — TRAMADOL HCL 50 MG PO TABS: 50.0000 mg | ORAL_TABLET | Freq: Four times a day (QID) | ORAL | 0 refills | Status: DC | PRN

## 2022-03-13 MED ORDER — ACETAMINOPHEN 500 MG PO TABS
ORAL_TABLET | ORAL | Status: AC
  Filled 2022-03-13: qty 2

## 2022-03-13 MED ORDER — ONDANSETRON HCL 4 MG/2ML IJ SOLN: 4.0000 mg | Freq: Once | INTRAMUSCULAR | Status: DC | PRN

## 2022-03-13 MED ORDER — FENTANYL CITRATE (PF) 100 MCG/2ML IJ SOLN
INTRAMUSCULAR | Status: DC | PRN
  Administered 2022-03-13 (×8): 25 ug via INTRAVENOUS

## 2022-03-13 MED ORDER — FENTANYL CITRATE (PF) 100 MCG/2ML IJ SOLN: 25.0000 ug | INTRAMUSCULAR | Status: DC | PRN

## 2022-03-13 MED ORDER — ACETAMINOPHEN 500 MG PO TABS
1000.0000 mg | ORAL_TABLET | Freq: Once | ORAL | Status: AC
  Administered 2022-03-13: 1000 mg via ORAL

## 2022-03-13 MED ORDER — DEXAMETHASONE SODIUM PHOSPHATE 4 MG/ML IJ SOLN
INTRAMUSCULAR | Status: DC | PRN
  Administered 2022-03-13: 5 mg via INTRAVENOUS

## 2022-03-13 MED ORDER — ONDANSETRON HCL 4 MG/2ML IJ SOLN
INTRAMUSCULAR | Status: AC
  Filled 2022-03-13: qty 2

## 2022-03-13 SURGICAL SUPPLY — 22 items
BAG DRAIN URO-CYSTO SKYTR STRL (DRAIN) ×2 IMPLANT
BAG DRN UROCATH (DRAIN) ×1
BASKET ZERO TIP NITINOL 2.4FR (BASKET) ×1 IMPLANT
BSKT STON RTRVL ZERO TP 2.4FR (BASKET) ×1
CATH URET 5FR 28IN OPEN ENDED (CATHETERS) ×2 IMPLANT
CLOTH BEACON ORANGE TIMEOUT ST (SAFETY) ×2 IMPLANT
COVER DOME SNAP 22 D (MISCELLANEOUS) ×1 IMPLANT
DRSG TEGADERM 4X4.75 (GAUZE/BANDAGES/DRESSINGS) IMPLANT
EXTRACTOR STONE 1.7FRX115CM (UROLOGICAL SUPPLIES) IMPLANT
GLOVE BIO SURGEON STRL SZ 6.5 (GLOVE) ×2 IMPLANT
GOWN STRL REUS W/TWL LRG LVL3 (GOWN DISPOSABLE) ×2 IMPLANT
GUIDEWIRE STR DUAL SENSOR (WIRE) ×3 IMPLANT
IV NS IRRIG 3000ML ARTHROMATIC (IV SOLUTION) ×3 IMPLANT
KIT TURNOVER CYSTO (KITS) ×2 IMPLANT
MANIFOLD NEPTUNE II (INSTRUMENTS) ×2 IMPLANT
NS IRRIG 500ML POUR BTL (IV SOLUTION) ×1 IMPLANT
PACK CYSTO (CUSTOM PROCEDURE TRAY) ×2 IMPLANT
STENT URET 6FRX26 CONTOUR (STENTS) ×1 IMPLANT
TRACTIP FLEXIVA PULS ID 200XHI (Laser) IMPLANT
TRACTIP FLEXIVA PULSE ID 200 (Laser) ×2
TUBE CONNECTING 12X1/4 (SUCTIONS) ×2 IMPLANT
TUBING UROLOGY SET (TUBING) ×2 IMPLANT

## 2022-03-13 NOTE — Transfer of Care (Signed)
Immediate Anesthesia Transfer of Care Note  Patient: Wesley [name]  Procedure(s) Performed: Procedure(s) (LRB): CYSTOSCOPY WITH RETROGRADE PYELOGRAM, URETEROSCOPY AND STENT PLACEMENT (Right) HOLMIUM LASER APPLICATION (Right)  Patient Location: PACU  Anesthesia Type: General  Level of Consciousness: awake, sedated, patient cooperative and responds to stimulation  Airway & Oxygen Therapy: Patient Spontanous Breathing and Patient connected to [place] 02   Post-op Assessment: Report given to PACU RN, Post -op Vital signs reviewed and stable and Patient moving all extremities  Post vital signs: Reviewed and stable  Complications: No apparent anesthesia complications

## 2022-03-13 NOTE — Op Note (Signed)
Preoperative diagnosis: right ureteral and renal calculi   Postoperative diagnosis: Right impacted distal ureteral calculi   Procedure:   Cystoscopy Right ureteroscopy, laser lithotripsy, basket stone extraction Right 31F x 26 ureteral stent placement -no tether Right retrograde pyelography with interpretation   Surgeon: Jacalyn Lefevre, MD   Anesthesia: General   Complications: None   Intraoperative findings:  Normal urethra Evidence of radiation to TUR with extruded brachytherapy seeds and necrotic tissue and prostatic urethra Bilateral orthotropic ureteral orifices however very close to bladder neck Right retrograde pyelography with hydronephrosis Bladder mucosa normal without masses  7.  31Fr x 26cm right ureteral stent -no tether 8.  Impacted right ureteral calculi   EBL: Minimal   Specimens: None   Disposition of specimens: [facility] for stone analysis   Indication: 71 yo man with multiple distal right ureteral stones and associated left symptoms. After reviewing the management options for treatment, the patient elected to proceed with the above surgical procedure(s). We have discussed the potential benefits and risks of the procedure, side effects of the proposed treatment, the likelihood of the patient achieving the goals of the procedure, and any potential problems that might occur during the procedure or recuperation. Informed consent has been obtained.     Description of procedure:   The patient was taken to the operating room and general anesthesia was induced.  The patient was placed in the dorsal lithotomy position, prepped and draped in the usual sterile fashion, and preoperative antibiotics were administered. A preoperative time-out was performed.    Cystourethroscopy was performed.  The patient's urethra was examined and demonstrated bilobar prostatic hypertrophy.The bladder was then systematically examined in its entirety. There was no evidence  for any bladder tumors, stones, or other mucosal pathology.     Attention then turned to the right ureteral orifice and a ureteral catheter was used to intubate the ureteral orifice.  Stones were seen crowning from the right UO.  Omnipaque contrast was injected through the ureteral catheter and a retrograde pyelogram was performed with findings as dictated above.   A 0.38 sensor guidewire was then advanced up the right ureter into the renal pelvis under fluoroscopic guidance. The 4.5 Fr semirigid ureteroscope was then advanced just inside the right ureteral orifice where multiple stones were seen.  The ureteral orifice itself was pale with fibrinous tissue likely from brachytherapy.  There were multiple calculi obstructing and adherent to the distal ureter.  The stones were then fragmented with the 242 micron holmium laser fiber. All stones larger than 1-80m were then removed from the ureter with a 0 tip basket.  Reinspection of the ureter revealed no remaining visible stones or fragments.  The distal ureter did appear to have significant inflammation from the impacted stones.  The wire was then backloaded through the cystoscope and a ureteral stent was advance over the wire using Seldinger technique.  The stent was positioned appropriately under fluoroscopic and cystoscopic guidance.  The wire was then removed with an adequate stent curl noted in the renal pelvis as well as in the bladder.   The bladder was then emptied and the procedure ended.  The patient appeared to tolerate the procedure well and without complications.  The patient was able to be awakened and transferred to the recovery unit in satisfactory condition.    Disposition: No tether was left on the stent and patient will continue the stent for 2 to 3 weeks.

## 2022-03-13 NOTE — Anesthesia Preprocedure Evaluation (Signed)
Anesthesia Evaluation  Patient identified by MRN, date of birth, ID band Patient awake    Reviewed: Allergy & Precautions, NPO status , Patient's Chart, lab work & pertinent test results  Airway Mallampati: I  TM Distance: >3 FB Neck ROM: Full    Dental no notable dental hx.    Pulmonary neg pulmonary ROS, former smoker,    Pulmonary exam normal breath sounds clear to auscultation       Cardiovascular hypertension, Pt. on medications negative cardio ROS Normal cardiovascular exam Rhythm:Regular Rate:Normal     Neuro/Psych  Headaches, Seizures -,  negative psych ROS   GI/Hepatic Neg liver ROS, PUD, GERD  ,Ulcerative colitis   Endo/Other  negative endocrine ROS  Renal/GU Renal disease  negative genitourinary   Musculoskeletal negative musculoskeletal ROS (+)   Abdominal   Peds negative pediatric ROS (+)  Hematology negative hematology ROS (+)   Anesthesia Other Findings   Reproductive/Obstetrics negative OB ROS                             Anesthesia Physical Anesthesia Plan  ASA: 3  Anesthesia Plan: General   Post-op Pain Management: Tylenol PO (pre-op)*   Induction: Intravenous  PONV Risk Score and Plan: 2 and Treatment may vary due to age or medical condition, Ondansetron and Dexamethasone  Airway Management Planned: LMA  Additional Equipment: None  Intra-op Plan:   Post-operative Plan: Extubation in OR  Informed Consent: I have reviewed the patients History and Physical, chart, labs and discussed the procedure including the risks, benefits and alternatives for the proposed anesthesia with the patient or authorized representative who has indicated his/her understanding and acceptance.     Dental advisory given  Plan Discussed with: CRNA, Anesthesiologist and Surgeon  Anesthesia Plan Comments:         Anesthesia Quick Evaluation

## 2022-03-13 NOTE — Interval H&P Note (Signed)
History and Physical Interval Note:  03/13/2022 12:55 PM  Wesley [name]  has presented today for surgery, with the diagnosis of URETERAL STONE.  The various methods of treatment have been discussed with the patient and family. After consideration of risks, benefits and other options for treatment, the patient has consented to  Procedure(s) with comments: CYSTOSCOPY WITH RETROGRADE PYELOGRAM, URETEROSCOPY AND STENT PLACEMENT (Right) - 58 MINS HOLMIUM LASER APPLICATION (Right) as a surgical intervention.  The patient's history has been reviewed, patient examined, no change in status, stable for surgery.  I have reviewed the patient's chart and labs.  Questions were answered to the patient's satisfaction.     Stasha Naraine D Keinan Brouillet

## 2022-03-13 NOTE — Discharge Instructions (Addendum)
DISCHARGE INSTRUCTIONS FOR KIDNEY STONE/URETERAL STENT   MEDICATIONS:  1. Resume all your other meds from home  2. AZO over the counter can help with the burning/stinging when you urinate. 3. Tramadol is for moderate/severe pain, otherwise taking up to 1000 mg every 6 hours of plainTylenol will help treat your pain.   4. Tamsulosin can help with stent discomfort.   ACTIVITY:  1. No strenuous activity x 1week  2. No driving while on narcotic pain medications  3. Drink plenty of water  4. Continue to walk at home - you can still get blood clots when you are at home, so keep active, but don't over do it.  5. May return to work/school tomorrow or when you feel ready   BATHING:  1. You can shower and we recommend daily showers    SIGNS/SYMPTOMS TO CALL:  Please call us if you have a fever greater than 101.5, uncontrolled nausea/vomiting, uncontrolled pain, dizziness, unable to urinate, bloody urine, chest pain, shortness of breath, leg swelling, leg pain, redness around wound, drainage from wound, or any other concerns or questions.   You can reach [place] at [phone].   FOLLOW-UP:  1. You do not have a string.  You will be contacted for ureteral stent removal in approx 3 weeks.    NO TYLENOL PRODUCTS UNTIL 6:45 PM TODAY.   Post Anesthesia Home Care Instructions  Activity: Get plenty of rest for the remainder of the day. A responsible individual must stay with you for 24 hours following the procedure.  For the next 24 hours, DO NOT: -Drive a car -Paediatric nurse -Drink alcoholic beverages -Take any medication unless instructed by your physician -Make any legal decisions or sign important papers.  Meals: Start with liquid foods such as gelatin or soup. Progress to regular foods as tolerated. Avoid greasy, spicy, heavy foods. If nausea and/or vomiting occur, drink only clear liquids until the nausea and/or vomiting subsides. Call your physician if vomiting continues.  Special  Instructions/Symptoms: Your throat may feel dry or sore from the anesthesia or the breathing tube placed in your throat during surgery. If this causes discomfort, gargle with warm salt water. The discomfort should disappear within 24 hours.

## 2022-03-13 NOTE — Anesthesia Procedure Notes (Signed)
Procedure Name: LMA Insertion Date/Time: 03/13/2022 2:12 PM Performed by: Justice Rocher, CRNA Pre-anesthesia Checklist: Patient identified, Emergency Drugs available, Suction available, Patient being monitored and Timeout performed Patient Re-evaluated:Patient Re-evaluated prior to induction Oxygen Delivery Method: Circle system utilized Preoxygenation: Pre-oxygenation with 100% oxygen Induction Type: IV induction Ventilation: Mask ventilation without difficulty LMA: LMA inserted LMA Size: 4.0 Number of attempts: 1 Airway Equipment and Method: Bite block Placement Confirmation: positive ETCO2, breath sounds checked- equal and bilateral and CO2 detector Tube secured with: Tape Dental Injury: Teeth and Oropharynx as per pre-operative assessment

## 2022-03-13 NOTE — H&P (Signed)
/HPI: cc: postop, void trial   Prostate Cancer - patient's PSA on 04/21/2020 is 0.015. He was treated with brachytherapy and external beam.  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 No further surveillance imaging needed.  ED - patient uses sildenafil which helps. He will let me know when he needs a refill.  BPH - patient using tamsulosin and finasteride without difficulty. His urinary symptoms are stable.  Low testosterone - patient uses testosterone replacement and most recent testosterone levels were in the 500s.    03/03/21: 71 year old man who underwent cystoscopy with bladder biopsy, urethral balloon dilation 08/2020. Patient comes in today as a work-in for urinary retention. Over the last few weeks he has only been able to void a few drops at night and history of progressively improved during the course the day. His PVR in the office today is 124 mL. He denies any hematuria. Urinalysis today is consistent with infection.   03/10/21: The patient states symptoms have not significantly improved since taking antibiotics. His urinalysis today continues to show bacteria.   04/20/21: 71 year old man with history of recurrent urethral stricture status post urethral balloon dilation 08/2020 and 03/2021 returns for follow-up. He has been self catheterizing 3 times a day to keep the stricture open. His urinary incontinence has improved. He was recently notified that his insurance company will no longer cover [place]. He continues to take tamsulosin at night.   11/28/20: 71 year old man with a history of prostate cancer treated with XRT and bulbar urethral stricture last dilated in June 2022 comes in with a day hx of fever and slight discomfort when removing catheter. He catheterizes 1 time a day to keep stricture open. He noticed fever to be 101.5 yesterday and felt terrible by midday. Urinalysis today consistent with infection.   12/14/21: 71 year old man with a history of prostate cancer treated  with XRT as well as bulbar urethral stricture dilated in June 2022 comes in for cystoscopy after having febrile UTI. He is feeling better but it has taken a long time for energy to return. His urine culture showed multiple drug resistance.   01/05/2022: 71 year old man underwent cystoscopy with cystoscopy lithotripsy, removal of extruded right brachytherapy seeds, and TURP for dystrophic calcifications here for void trial.   01/16/2022: He passed trial of void at last office visit. Back today for follow-up exam. Force of stream remains variable. He is not having any dysuria or gross hematuria. He continues tamsulosin, finasteride and trospium. Also remains on low-dose Cipro for bacterial suppression. He continues to have variable daytime frequency with mild to severe urgency. Also getting up 3-6 times per night. Over the past 5 days he has developed painful urgency to the point of not being able to void, at that time he has cathed himself without difficulty noting a significant amount drained from the bladder, also bringing relief to his bothersome symptoms. He was previously only doing CIC to maintain the patency of his known urethral stricture disease. PVR today less than 2 ounces and UA not overly concerning for underlying infectious process. He denies fevers or chills, nausea/vomiting or other correlating signs/symptoms of systemic infection.   02/23/2022: 71 year old man with the above-noted past medical history who presents today due to concerns of increased urinary urgency, frequency and dysuria. He reports this began approximately 5 to 6 days ago. He does perform self-catheterization and noticed that it was quite painful at the time he performed it. He feels he is emptying his bladder appropriately. He denies  fevers, chills. He has not seen any gross blood in his urine today. He does have a history of nephrolithiasis and does have some right-sided flank discomfort but does not feel this is a stone  causing his issues.   03/02/2022: 71 year old man who presents today with concerns of increased urinary frequency, urgency and dysuria. He also has been having some low back pain. Because his symptoms have not resolved, we will obtain imaging today.     ALLERGIES: Sulfa Drugs    MEDICATIONS: Androgel 20.25 mg/1.25 gram per actuation (1.62 %) gel in metered-dose pump Apply 4 pumps daily to inner thigh  Finasteride 5 mg tablet 1 tablet PO Daily  Sildenafil Citrate 20 mg tablet 1-2 tablet PO PRN  Tamsulosin Hcl 0.4 mg capsule 1 capsule PO Q HS  Amlodipine Besylate 5 mg tablet Oral  Ciclopirox 0.77 % cream External  Dicyclomine Hcl 20 mg tablet  Doxepin Hcl 50 mg capsule  LevETIRAcetam 500 MG Oral Tablet Oral  Oxtellar Xr 600 mg tablet, extended release 24 hr Oral  Stelara     GU PSH: Cysto Bladder Stone <2.5cm - 08/23/2020, 2020 Cysto Bladder Ureth Biopsy - 2013 Cysto Dilate Stricture (M or F) - 03/13/2021, 08/23/2020, 2020 Cystoscopy - 12/14/2021, 03/10/2021, 08/11/2020, 2020 Cystoscopy TURBT <2 cm - 08/23/2020 Cystoscopy Ureteroscopy - 2010 Locm 300-'399Mg'$ /Ml Iodine,1Ml - 10/05/2019, 2020 TRANSPERI NEEDLE PLACE, PROS - 2015       PSH Notes: Inguinal Hernia Repair, Surgery Prostate Transperineal Placement Of Needles, Cystoscopy With Biopsy, Craniotomy Suboccipital Excision Of Meningioma, Cystoscopy With Ureteroscopy Left, Arthroscopy Knee Right   NON-GU PSH: Knee Arthroscopy; Dx - 2009 Remove Brain Lining Lesion - 2011     GU PMH: Acute Cystitis/UTI - 02/22/2022, - 11/28/2021 Bladder Stone - 01/16/2022, - 01/05/2022 (Stable), - 12/14/2021, The source of his gross hematuria is almost certainly the stones located within his bladder on CT scan. I was not able to visualize these due to his stricture., - 2020 BPH w/LUTS - 01/16/2022, - 12/14/2021 (Stable), Patient to continue tamsulosin and finasteride., - 12/22/2020, Benign prostatic hyperplasia with urinary obstruction, - 2015 Bulbar urethral  stricture - 01/16/2022, - 12/14/2021, - 11/28/2021, - 04/20/2021, - 03/28/2021, - 03/16/2021, - 03/10/2021, Patient is voiding well without difficulty. Will repeat PVR next visit., - 12/22/2020, - 09/13/2020, - 08/26/2020, Based on cystoscopy today would like to proceed with cystoscopy, balloon dilation of urethral stricture, and laser lithotripsy of stones. I discussed the risks and benefits of the procedure including but not limited to pain, bleeding, recurrence of stricture, damage to surrounding structures including urethra and bladder, need for Foley catheter or additional procedures after this. Patient understands and wishes to proceed., - 08/11/2020, I discovered a moderate bulbar urethral stricture that would not allow passage of the flexible cystoscope even with pressure and I did not want to cause him any discomfort in the office since I am going to have to take care of his bladder calculi as well so we discussed performing dilation at the time of his cystolitholapaxy., - 2020 History of prostate cancer - 01/16/2022, - 12/14/2021, - 03/16/2021, Repeat PSA in July 2022, - 12/22/2020, - 09/13/2020, He has nothing to suggest recurrence with no abnormality on DRE and a PSA that remains undetectable., - 2021 (Stable), Although he has a history of prostate cancer treated with radiation I told him I was not particularly concerned about the possibility of this being secondary to prostate cancer but rather more likely secondary to the effects of radiation., - 2020 (Stable),  His prostate cancer remains under excellent control with no abnormality noted on DRE and PSA that remains undetectable., - 2019 (Stable), His erectile dysfunction is likely multifactorial but most likely secondary to the treatment of his prostate cancer with radiation. Also up with me as previously scheduled for his annual prostate cancer follow-up appointment, - 2019 (Stable), At this point it appears his prostate cancer has been well controlled with a PSA that  is currently 0.017. I will continue to see him on a yearly basis., - 2018 (Stable), His prostate remains flat and benign to examination. His PSA remains undetectable. I will continue to see him on a yearly basis for DRE and PSA., - 2017, History of prostate cancer, - 2016 Gross hematuria - 01/05/2022, - 09/13/2020, He is experienced gross hematuria and has had previous ionizing radiation and therefore a full evaluation will be undertaken. He has had a history of stones as well so I will obtain a CT scan to evaluate the upper tract and then he will return for completion of his workup with cystoscopy., - 2020 Urinary Urgency - 04/20/2021, - 09/13/2020 (Worsening), He uses DDAVP at night but still is having some nocturia and also is having some urgency and therefore I have recommended a trial of Toviaz to see if he notes this helps any better than the Myrbetriq., - 2019 Weak Urinary Stream - 04/20/2021, - 03/03/2021 Incomplete bladder emptying - 03/03/2021 Microscopic hematuria, Patient had gross hematuria workup late last year. Will continue to monitor this. - 12/22/2020 Nocturia - 12/22/2020, - 09/13/2020, Nocturia, - 2016 Renal cyst, Right, No further imaging necessary for simple cyst - 12/22/2020, Right, Has Bosniak 2 F cysts. It was suggested that further characterization may be attained with an MRI scan so I will schedule him for that in 6 months., - 2021, Renal cysts, acquired, bilateral, - 2014 Calculus of LUT, Unspec - 08/26/2020, - 08/11/2020 Primary hypogonadism, This is managed with AndroGel 3 pumps. He will remain on this. - 2021, (Stable), We discussed the fact that his serum testosterone level was found to be above the reference range. He felt that that was the day that he used to of the packets so what I have recommended is he decreased the dosage down to 1 packet daily., - 2019, Hypogonadism, testicular, - 2016 Renal calculus, Right, He has nonobstructing right renal calculi. I do not feel that this in  any way contributed to his gross hematuria - 2020 ED due to arterial insufficiency (Worsening), He is going to use sildenafil 20 mg titrating up to 100 mg as needed. - 2019 Urinary Frequency, He is having some new frequency and his nocturia has increased as well. He is using tamsulosin and also Celebrex which resulted in resolution of his nocturia but what I have recommended is a trial of Myrbetriq 25 mg. I have given him samples of this. - 2018, Increased urinary frequency, - 2014 Unil Inguinal Hernia W/O obst or gang,non-recurrent, Right inguinal hernia - 2015 Elevated PSA, PSA,Elevated - 2014 Peyronies Disease, Peyronie's disease - 2014 Ureteral calculus, Ureteral Stone - 2014 Urinary Retention, Unspec, Incomplete bladder emptying - 2014      PMH Notes: Prostate cancer:  Date of diagnosis: 10/29/08  Pretreatment PSA: 5.2  Gleason score: 4+3=7, 5/12 cores positive with only one core positive for Gleason 4+3.  Stage - T1c  Treatment: External Beam Radiation 3-4/10 / Brachytherapy 03/04/09   Nephrolithiasis: In 7/10 he underwent a CT scan because of abdominal complaints and was found to have  a 3 mm distal right ureteral calculus but no renal calculi were identified. He subsequently passed right ureteral stone. In 10/10 he underwent left ureteroscopy for what appeared to be a stone overlying the left ureter without progression. The stone was located over the sacral region however at the time of his retrograde pyelogram and ureteroscopy no stone could be identified within the ureter.  CT scan in the 5/20 revealed a right renal calculi with no left renal stones noted.   Nocturia: He had noted nocturia as well as some daytime frequency and had been treated with a 5 alpha reductase inhibitor as well as an alpha-blocker but remains symptomatic. I tried DDAVP however this resulted in worsening of his hesitancy he did not want to remain on this therapy. I had prescribed anticholinergics in the past but he  was reluctant to reinitiate this form of therapy because of his history of ulcerative colitis but once that was under control he elected to undergo a trial of VESIcare at a 10 mg dose which was ineffective. I recommended DDAVP which was started in 5/13.   Hypogonadism: He was found to have hypogonadism with a low free testosterone level of 5.5 in 2/11. Initially I recommended he not undergo testosterone replacement since he had just been diagnosed with prostate cancer. Now that he has shown a significant fall and stabilization of his PSA I therefore initiated testosterone replacement therapy in 5/13.  Current therapy: AndroGel 4 pumps q.a.m.   BPH with outlet obstruction: He developed hematuria with clot retention and eventually voided without difficulty but I placed him on finasteride (5/13) in order to prevent this from occurring in the future.  Current therapy: Tamsulosin and finasteride.   Gross hematuria: He initially experienced gross hematuria in 2/20. A CT scan in 5/20 revealed no abnormality of the upper tract and bladder calculi. Cystoscopy revealed a bulbar urethral stricture.   Bulbar urethral stricture: This was discovered at the time of cystoscopy and treated with balloon dilatation on 03/13/19.   Bilateral renal cysts: He was found have bilateral renal cysts by CT scan in 5/20. There were simple with a Bosniak 58F cyst on the right.   Bladder calculi: He was found to have bladder calculi in underwent cystolitholapaxy on 03/13/19.     NON-GU PMH: Encounter for general adult medical examination without abnormal findings, Encounter for preventive health examination - 01/09/2014 Personal history of other diseases of the circulatory system, History of hypertension - 09-Jan-2013 Personal history of other endocrine, nutritional and metabolic disease, History of hypercholesterolemia - 2013-01-09 Personal history of other specified conditions, History of heartburn - 01/09/2013    FAMILY HISTORY: Death In The Family  Father - Father Death In The Family Mother - Mother Family Health Status Number - Runs In Family   SOCIAL HISTORY: Marital Status: Married Preferred Language: English; Ethnicity: Not Hispanic Or Latino; Race: White Current Smoking Status: Patient does not smoke anymore.   Tobacco Use Assessment Completed: Used Tobacco in last 30 days? Does drink.  Drinks 3 caffeinated drinks per day.     Notes: Former smoker, Caffeine Use, Marital History - Currently Married, Alcohol Use, Tobacco Use, Occupation:   REVIEW OF SYSTEMS:    GU Review Male:   Patient reports hard to postpone urination and frequent urination. Patient denies penile pain, trouble starting your stream, stream starts and stops, erection problems, have to strain to urinate , get up at night to urinate, leakage of urine, and burning/ pain with urination.  Gastrointestinal (Upper):  Patient denies nausea, vomiting, and indigestion/ heartburn.  Gastrointestinal (Lower):   Patient denies diarrhea and constipation.  Constitutional:   Patient denies fever, night sweats, weight loss, and fatigue.  Skin:   Patient denies skin rash/ lesion and itching.  Eyes:   Patient denies blurred vision and double vision.  Ears/ Nose/ Throat:   Patient denies sore throat and sinus problems.  Hematologic/Lymphatic:   Patient denies swollen glands and easy bruising.  Cardiovascular:   Patient denies leg swelling and chest pains.  Musculoskeletal:   Patient denies back pain and joint pain.  Neurological:   Patient denies headaches and dizziness.  Psychologic:   Patient denies depression and anxiety.   VITAL SIGNS: None   MULTI-SYSTEM PHYSICAL EXAMINATION:    Constitutional: Well-nourished. No physical deformities. Normally developed. Good grooming.  Neck: Neck symmetrical, not swollen. Normal tracheal position.  Respiratory: No labored breathing, no use of accessory muscles.   Cardiovascular: Normal temperature, normal extremity pulses, no swelling,  no varicosities.  Lymphatic: No enlargement of neck, axillae, groin.  Skin: No paleness, no jaundice, no cyanosis. No lesion, no ulcer, no rash.  Neurologic / Psychiatric: Oriented to time, oriented to place, oriented to person. No depression, no anxiety, no agitation.  Gastrointestinal: No mass, no tenderness, no rigidity, non obese abdomen.  Eyes: Normal conjunctivae. Normal eyelids.  Ears, Nose, Mouth, and Throat: Left ear no scars, no lesions, no masses. Right ear no scars, no lesions, no masses. Nose no scars, no lesions, no masses. Normal hearing. Normal lips.  Musculoskeletal: Normal gait and station of head and neck.     Complexity of Data:  Source Of History:  Patient  Records Review:   Previous Doctor Records, Previous Patient Records  Urine Test Review:   Urinalysis, Urine Culture  X-Ray Review: C.T. Abdomen/Pelvis: Reviewed Films. Reviewed Report. Discussed With Patient.     04/13/21 04/21/20 09/29/19 09/23/18 09/13/17 05/30/17 09/18/16 09/14/15  PSA  Total PSA <0.015 ng/mL <0.015 ng/mL <0.015 ng/mL <0.015 ng/mL 0.017 ng/mL <0.015 ng/dL < 0.015  <0.01     04/21/20 09/25/19 09/23/18 05/30/17 04/05/14 03/03/14 08/21/13 08/19/12  Hormones  Testosterone, Total 595.4 ng/dL 705.9 ng/dL 1350.7 ng/dL 543.9 ng/dL 467  273  280  478.95     03/02/22  Urinalysis  Urine Appearance Cloudy   Urine Color Yellow   Urine Glucose Neg mg/dL  Urine Bilirubin Neg mg/dL  Urine Ketones Neg mg/dL  Urine Specific Gravity 1.025   Urine Blood Trace ery/uL  Urine pH 5.5   Urine Protein 1+ mg/dL  Urine Urobilinogen 0.2 mg/dL  Urine Nitrites Neg   Urine Leukocyte Esterase 2+ leu/uL  Urine WBC/hpf 40 - 60/hpf   Urine RBC/hpf 3 - 10/hpf   Urine Epithelial Cells 0 - 5/hpf   Urine Bacteria Many (>50/hpf)   Urine Mucous Not Present   Urine Yeast NS (Not Seen)   Urine Trichomonas Not Present   Urine Cystals NS (Not Seen)   Urine Casts NS (Not Seen)   Urine Sperm Not Present    PROCEDURES:          C.T. Urogram - [id number]      Patient confirmed No Neulasta OnPro Device.         Urinalysis w/Scope Dipstick Dipstick Cont'd Micro  Color: Yellow Bilirubin: Neg mg/dL WBC/hpf: 40 - 60/hpf  Appearance: Cloudy Ketones: Neg mg/dL RBC/hpf: 3 - 10/hpf  Specific Gravity: 1.025 Blood: Trace ery/uL Bacteria: Many (>50/hpf)  pH: 5.5 Protein: 1+ mg/dL Cystals: NS (Not Seen)  Glucose:  Neg mg/dL Urobilinogen: 0.2 mg/dL Casts: NS (Not Seen)    Nitrites: Neg Trichomonas: Not Present    Leukocyte Esterase: 2+ leu/uL Mucous: Not Present      Epithelial Cells: 0 - 5/hpf      Yeast: NS (Not Seen)      Sperm: Not Present    ASSESSMENT:      ICD-10 Details  1 GU:   Gross hematuria - S97.0 Acute, Uncomplicated  2   Ureteral calculus - N20.1 Right, Acute, Uncomplicated   PLAN:           Orders X-Rays: C.T. Stone Protocol Without I.V. Contrast - blood in urine, HX stones  X-Ray Notes: History:  Hematuria: Yes/No  Patient to see MD after exam: Yes/No  Previous exam: CT / IVP/ US/ KUB/ None  When:  Where:  Diabetic: Yes/ No  BUN/ Creatinine:  Date of last BUN Creatinine:  Weight in pounds:  Allergy- IV Contrast: Yes/ No  Conflicting diabetic meds: Yes/ No  Diabetic Meds:  Prior Authorization #Donley Redder [id number] Valid 03/02/2022 thru 03/31/2022            Schedule Return Visit/Planned Activity: Next Available Appointment - Schedule Surgery          Document Letter(s):  Created for Patient: Clinical Summary         Notes:   CT imagign shows multiple calculi in the right distal ureter causing mild-moderate hydronephrosis. This was discussed with Mr. Colgan and his urologist, Dr. Claudia Desanctis. He would like pursue definitive stone intervention and a green sheet for cystoscopy, right URS and HLL was placed today. He was advised of the risks of the procedure as well as the risks of anesthesia. He was offered pain medication but did not feel he needed any at the  moment. He was given very strict return to office or proceed to the ED precautions in the interval.

## 2022-03-14 ENCOUNTER — Encounter (HOSPITAL_BASED_OUTPATIENT_CLINIC_OR_DEPARTMENT_OTHER): Payer: Self-pay | Admitting: Urology

## 2022-03-14 NOTE — Anesthesia Postprocedure Evaluation (Signed)
Anesthesia Post Note  Patient: Wesley [name]  Procedure(s) Performed: CYSTOSCOPY WITH RETROGRADE PYELOGRAM, URETEROSCOPY AND STENT PLACEMENT (Right: Renal) HOLMIUM LASER APPLICATION (Right: Renal)     Patient location during evaluation: PACU Anesthesia Type: General Level of consciousness: awake Pain management: pain level controlled Vital Signs Assessment: post-procedure vital signs reviewed and stable Respiratory status: spontaneous breathing and respiratory function stable Cardiovascular status: stable Postop Assessment: no apparent nausea or vomiting Anesthetic complications: no   No notable events documented.  Last Vitals:  Vitals:   03/13/22 1655 03/13/22 1700  BP: (!) 153/79   Pulse: 66   Resp: 13   Temp: 36.5 C   SpO2:  98%    Last Pain:  Vitals:   03/14/22 1107  TempSrc:   PainSc: 0-No pain   Pain Goal: Patients Stated Pain Goal: 5 (03/13/22 1700)                 Merlinda Frederick

## 2022-03-21 DIAGNOSIS — G40209 Localization-related (focal) (partial) symptomatic epilepsy and epileptic syndromes with complex partial seizures, not intractable, without status epilepticus: Secondary | ICD-10-CM | POA: Diagnosis not present

## 2022-03-21 DIAGNOSIS — Z79899 Other long term (current) drug therapy: Secondary | ICD-10-CM | POA: Diagnosis not present

## 2022-03-21 DIAGNOSIS — G250 Essential tremor: Secondary | ICD-10-CM | POA: Diagnosis not present

## 2022-03-29 DIAGNOSIS — L218 Other seborrheic dermatitis: Secondary | ICD-10-CM | POA: Diagnosis not present

## 2022-03-29 DIAGNOSIS — D1801 Hemangioma of skin and subcutaneous tissue: Secondary | ICD-10-CM | POA: Diagnosis not present

## 2022-03-29 DIAGNOSIS — L570 Actinic keratosis: Secondary | ICD-10-CM | POA: Diagnosis not present

## 2022-03-29 DIAGNOSIS — L820 Inflamed seborrheic keratosis: Secondary | ICD-10-CM | POA: Diagnosis not present

## 2022-04-06 DIAGNOSIS — N401 Enlarged prostate with lower urinary tract symptoms: Secondary | ICD-10-CM | POA: Diagnosis not present

## 2022-04-06 DIAGNOSIS — N201 Calculus of ureter: Secondary | ICD-10-CM | POA: Diagnosis not present

## 2022-04-13 DIAGNOSIS — N201 Calculus of ureter: Secondary | ICD-10-CM | POA: Diagnosis not present

## 2022-04-18 DIAGNOSIS — N35011 Post-traumatic bulbous urethral stricture: Secondary | ICD-10-CM | POA: Diagnosis not present

## 2022-04-18 DIAGNOSIS — N201 Calculus of ureter: Secondary | ICD-10-CM | POA: Diagnosis not present

## 2022-04-18 DIAGNOSIS — N3000 Acute cystitis without hematuria: Secondary | ICD-10-CM | POA: Diagnosis not present

## 2022-04-18 DIAGNOSIS — Z8546 Personal history of malignant neoplasm of prostate: Secondary | ICD-10-CM | POA: Diagnosis not present

## 2022-05-07 DIAGNOSIS — N35011 Post-traumatic bulbous urethral stricture: Secondary | ICD-10-CM | POA: Diagnosis not present

## 2022-05-07 DIAGNOSIS — N130 Hydronephrosis with ureteropelvic junction obstruction: Secondary | ICD-10-CM | POA: Diagnosis not present

## 2022-05-07 DIAGNOSIS — N401 Enlarged prostate with lower urinary tract symptoms: Secondary | ICD-10-CM | POA: Diagnosis not present

## 2022-05-07 DIAGNOSIS — R8279 Other abnormal findings on microbiological examination of urine: Secondary | ICD-10-CM | POA: Diagnosis not present

## 2022-05-07 DIAGNOSIS — R3915 Urgency of urination: Secondary | ICD-10-CM | POA: Diagnosis not present

## 2022-05-11 DIAGNOSIS — L03031 Cellulitis of right toe: Secondary | ICD-10-CM | POA: Diagnosis not present

## 2022-05-25 DIAGNOSIS — G40909 Epilepsy, unspecified, not intractable, without status epilepticus: Secondary | ICD-10-CM | POA: Diagnosis not present

## 2022-05-25 DIAGNOSIS — L03031 Cellulitis of right toe: Secondary | ICD-10-CM | POA: Diagnosis not present

## 2022-05-25 DIAGNOSIS — I10 Essential (primary) hypertension: Secondary | ICD-10-CM | POA: Diagnosis not present

## 2022-05-25 DIAGNOSIS — M898X8 Other specified disorders of bone, other site: Secondary | ICD-10-CM | POA: Diagnosis not present

## 2022-05-25 DIAGNOSIS — Z01812 Encounter for preprocedural laboratory examination: Secondary | ICD-10-CM | POA: Diagnosis not present

## 2022-05-29 DIAGNOSIS — N200 Calculus of kidney: Secondary | ICD-10-CM | POA: Diagnosis not present

## 2022-06-06 DIAGNOSIS — N35011 Post-traumatic bulbous urethral stricture: Secondary | ICD-10-CM | POA: Diagnosis not present

## 2022-06-06 DIAGNOSIS — R3915 Urgency of urination: Secondary | ICD-10-CM | POA: Diagnosis not present

## 2022-06-06 DIAGNOSIS — R350 Frequency of micturition: Secondary | ICD-10-CM | POA: Diagnosis not present

## 2022-06-06 DIAGNOSIS — N200 Calculus of kidney: Secondary | ICD-10-CM | POA: Diagnosis not present

## 2022-06-07 DIAGNOSIS — K5190 Ulcerative colitis, unspecified, without complications: Secondary | ICD-10-CM | POA: Diagnosis not present

## 2022-06-08 ENCOUNTER — Encounter (HOSPITAL_BASED_OUTPATIENT_CLINIC_OR_DEPARTMENT_OTHER): Payer: Self-pay | Admitting: Urology

## 2022-06-08 ENCOUNTER — Other Ambulatory Visit: Payer: Self-pay | Admitting: Urology

## 2022-06-08 ENCOUNTER — Other Ambulatory Visit: Payer: Self-pay

## 2022-06-08 NOTE — Progress Notes (Addendum)
Spoke w/ via phone for pre-op interview---[name] needs dos---- ISTAT              Lab results------01/02/22 EKG in chart & [place] test -----patient states asymptomatic no test needed Arrive at -------0730 on Tuesday, 06/19/22 NPO after MN NO Solid Food.  Clear liquids from MN until---0630 Med rec completed Medications to take morning of surgery -----Keppra, [place], Keflex Diabetic medication -----n/a Patient instructed no nail polish to be worn day of surgery Patient instructed to bring photo id and insurance card day of surgery Patient aware to have Driver (ride ) / caregiver    for 24 hours after surgery - son, Wesley [name] Patient Special Instructions -----none Pre-Op special Istructions -----none Patient verbalized understanding of instructions that were given at this phone interview. Patient denies shortness of breath, chest pain, fever, cough at this phone interview.   Hx last updated on 03/08/22 by Everitt Amber, RN. Patient states no changes in health history since 03/08/22.

## 2022-06-19 ENCOUNTER — Encounter (HOSPITAL_BASED_OUTPATIENT_CLINIC_OR_DEPARTMENT_OTHER)
Admission: RE | Disposition: A | Discharge status: Home / Self Care | Payer: Self-pay | Source: Home / Self Care | Attending: Urology

## 2022-06-19 ENCOUNTER — Other Ambulatory Visit: Payer: Self-pay

## 2022-06-19 ENCOUNTER — Ambulatory Visit (HOSPITAL_BASED_OUTPATIENT_CLINIC_OR_DEPARTMENT_OTHER): Payer: Medicare Other | Admitting: Anesthesiology

## 2022-06-19 ENCOUNTER — Encounter (HOSPITAL_BASED_OUTPATIENT_CLINIC_OR_DEPARTMENT_OTHER): Payer: Self-pay | Admitting: Urology

## 2022-06-19 ENCOUNTER — Ambulatory Visit (HOSPITAL_BASED_OUTPATIENT_CLINIC_OR_DEPARTMENT_OTHER)
Admission: RE | Admit: 2022-06-19 | Discharge: 2022-06-19 | Disposition: A | Discharge status: Home / Self Care | Payer: Medicare Other | Attending: Urology | Admitting: Urology

## 2022-06-19 DIAGNOSIS — N200 Calculus of kidney: Secondary | ICD-10-CM | POA: Diagnosis not present

## 2022-06-19 DIAGNOSIS — N35912 Unspecified bulbous urethral stricture, male: Secondary | ICD-10-CM | POA: Diagnosis not present

## 2022-06-19 DIAGNOSIS — Z87891 Personal history of nicotine dependence: Secondary | ICD-10-CM

## 2022-06-19 DIAGNOSIS — N401 Enlarged prostate with lower urinary tract symptoms: Secondary | ICD-10-CM | POA: Diagnosis not present

## 2022-06-19 DIAGNOSIS — K219 Gastro-esophageal reflux disease without esophagitis: Secondary | ICD-10-CM | POA: Insufficient documentation

## 2022-06-19 DIAGNOSIS — I10 Essential (primary) hypertension: Secondary | ICD-10-CM

## 2022-06-19 DIAGNOSIS — Z79899 Other long term (current) drug therapy: Secondary | ICD-10-CM | POA: Diagnosis not present

## 2022-06-19 DIAGNOSIS — N132 Hydronephrosis with renal and ureteral calculous obstruction: Secondary | ICD-10-CM | POA: Insufficient documentation

## 2022-06-19 DIAGNOSIS — Z923 Personal history of irradiation: Secondary | ICD-10-CM | POA: Insufficient documentation

## 2022-06-19 DIAGNOSIS — Z01818 Encounter for other preprocedural examination: Secondary | ICD-10-CM

## 2022-06-19 DIAGNOSIS — N281 Cyst of kidney, acquired: Secondary | ICD-10-CM | POA: Diagnosis not present

## 2022-06-19 DIAGNOSIS — Z8546 Personal history of malignant neoplasm of prostate: Secondary | ICD-10-CM | POA: Diagnosis not present

## 2022-06-19 HISTORY — PX: CYSTOSCOPY WITH URETHRAL DILATATION: SHX5125

## 2022-06-19 LAB — POCT I-STAT, CHEM 8
BUN: 29 mg/dL — ABNORMAL HIGH (ref 8–23)
Calcium, Ion: 1.29 mmol/L (ref 1.15–1.40)
Chloride: 107 mmol/L (ref 98–111)
Creatinine, Ser: 2 mg/dL — ABNORMAL HIGH (ref 0.61–1.24)
Glucose, Bld: 103 mg/dL — ABNORMAL HIGH (ref 70–99)
HCT: 40 % (ref 39.0–52.0)
Hemoglobin: 13.6 g/dL (ref 13.0–17.0)
Potassium: 4.2 mmol/L (ref 3.5–5.1)
Sodium: 141 mmol/L (ref 135–145)
TCO2: 25 mmol/L (ref 22–32)

## 2022-06-19 SURGERY — CYSTOSCOPY, WITH URETHRAL DILATION
Anesthesia: General | Site: Urethra

## 2022-06-19 MED ORDER — SODIUM CHLORIDE 0.9 % IR SOLN
Status: DC | PRN
  Administered 2022-06-19 (×3): 3000 mL

## 2022-06-19 MED ORDER — SODIUM CHLORIDE 0.9 % IV SOLN: INTRAVENOUS | Status: DC

## 2022-06-19 MED ORDER — MIDAZOLAM HCL 2 MG/2ML IJ SOLN
INTRAMUSCULAR | Status: AC
  Filled 2022-06-19: qty 2

## 2022-06-19 MED ORDER — PROPOFOL 10 MG/ML IV BOLUS
INTRAVENOUS | Status: AC
  Filled 2022-06-19: qty 20

## 2022-06-19 MED ORDER — CEFAZOLIN SODIUM-DEXTROSE 2-4 GM/100ML-% IV SOLN
2.0000 g | INTRAVENOUS | Status: AC
  Administered 2022-06-19: 2 g via INTRAVENOUS

## 2022-06-19 MED ORDER — ONDANSETRON HCL 4 MG/2ML IJ SOLN
INTRAMUSCULAR | Status: DC | PRN
  Administered 2022-06-19: 4 mg via INTRAVENOUS

## 2022-06-19 MED ORDER — AMISULPRIDE (ANTIEMETIC) 5 MG/2ML IV SOLN: 10.0000 mg | Freq: Once | INTRAVENOUS | Status: DC | PRN

## 2022-06-19 MED ORDER — CEFAZOLIN SODIUM-DEXTROSE 2-4 GM/100ML-% IV SOLN
INTRAVENOUS | Status: AC
  Filled 2022-06-19: qty 100

## 2022-06-19 MED ORDER — LIDOCAINE 2% (20 MG/ML) 5 ML SYRINGE
INTRAMUSCULAR | Status: DC | PRN
  Administered 2022-06-19: 100 mg via INTRAVENOUS

## 2022-06-19 MED ORDER — FENTANYL CITRATE (PF) 100 MCG/2ML IJ SOLN
INTRAMUSCULAR | Status: AC
  Filled 2022-06-19: qty 2

## 2022-06-19 MED ORDER — LACTATED RINGERS IV SOLN: INTRAVENOUS | Status: DC

## 2022-06-19 MED ORDER — PROMETHAZINE HCL 25 MG/ML IJ SOLN: 6.2500 mg | INTRAMUSCULAR | Status: DC | PRN

## 2022-06-19 MED ORDER — DEXAMETHASONE SODIUM PHOSPHATE 10 MG/ML IJ SOLN
INTRAMUSCULAR | Status: DC | PRN
  Administered 2022-06-19: 5 mg via INTRAVENOUS

## 2022-06-19 MED ORDER — 0.9 % SODIUM CHLORIDE (POUR BTL) OPTIME
TOPICAL | Status: DC | PRN
  Administered 2022-06-19: 500 mL

## 2022-06-19 MED ORDER — FENTANYL CITRATE (PF) 250 MCG/5ML IJ SOLN
INTRAMUSCULAR | Status: DC | PRN
  Administered 2022-06-19: 25 ug via INTRAVENOUS
  Administered 2022-06-19: 50 ug via INTRAVENOUS
  Administered 2022-06-19: 25 ug via INTRAVENOUS

## 2022-06-19 MED ORDER — ACETAMINOPHEN 500 MG PO TABS
1000.0000 mg | ORAL_TABLET | Freq: Once | ORAL | Status: AC
  Administered 2022-06-19: 1000 mg via ORAL

## 2022-06-19 MED ORDER — STERILE WATER FOR IRRIGATION IR SOLN
Status: DC | PRN
  Administered 2022-06-19: 500 mL

## 2022-06-19 MED ORDER — PROPOFOL 10 MG/ML IV BOLUS
INTRAVENOUS | Status: DC | PRN
  Administered 2022-06-19: 120 mg via INTRAVENOUS

## 2022-06-19 MED ORDER — ACETAMINOPHEN 500 MG PO TABS
ORAL_TABLET | ORAL | Status: AC
  Filled 2022-06-19: qty 2

## 2022-06-19 MED ORDER — FENTANYL CITRATE (PF) 100 MCG/2ML IJ SOLN: 25.0000 ug | INTRAMUSCULAR | Status: DC | PRN

## 2022-06-19 MED ORDER — IOHEXOL 300 MG/ML  SOLN
INTRAMUSCULAR | Status: DC | PRN
  Administered 2022-06-19: 30 mL via URETHRAL

## 2022-06-19 SURGICAL SUPPLY — 27 items
BAG DRAIN URO-CYSTO SKYTR STRL (DRAIN) ×2 IMPLANT
BAG DRN RND TRDRP ANRFLXCHMBR (UROLOGICAL SUPPLIES) ×2
BAG DRN UROCATH (DRAIN) ×2
BAG URINE DRAIN 2000ML AR STRL (UROLOGICAL SUPPLIES) ×1 IMPLANT
BALLN NEPHROSTOMY (BALLOONS) ×2
BALLN OPTILUME DCB 30X3X75 (BALLOONS) ×2
BALLOON NEPHROSTOMY (BALLOONS) ×1 IMPLANT
BALLOON OPTILUME DCB 30X3X75 (BALLOONS) ×1 IMPLANT
CATH FOLEY 2W COUNCIL 5CC 16FR (CATHETERS) ×1 IMPLANT
CATH URETL OPEN 5X70 (CATHETERS) ×2 IMPLANT
CLOTH BEACON ORANGE TIMEOUT ST (SAFETY) ×2 IMPLANT
GLOVE BIO SURGEON STRL SZ 6.5 (GLOVE) ×2 IMPLANT
GLOVE BIOGEL PI IND STRL 7.0 (GLOVE) ×2 IMPLANT
GOWN STRL REUS W/TWL LRG LVL3 (GOWN DISPOSABLE) ×2 IMPLANT
GOWN STRL REUS W/TWL XL LVL3 (GOWN DISPOSABLE) ×1 IMPLANT
GUIDEWIRE STR DUAL SENSOR (WIRE) ×2 IMPLANT
HOLDER FOLEY CATH W/STRAP (MISCELLANEOUS) ×1 IMPLANT
IV NS IRRIG 3000ML ARTHROMATIC (IV SOLUTION) ×3 IMPLANT
KIT BALLN UROMAX 15FX4 (MISCELLANEOUS) IMPLANT
KIT BALLN UROMAX 26 75X4 (MISCELLANEOUS)
KIT TURNOVER CYSTO (KITS) ×2 IMPLANT
MANIFOLD NEPTUNE II (INSTRUMENTS) ×2 IMPLANT
PACK CYSTO (CUSTOM PROCEDURE TRAY) ×2 IMPLANT
SYR 10ML LL (SYRINGE) ×1 IMPLANT
TUBE CONNECTING 12X1/4 (SUCTIONS) ×2 IMPLANT
TUBING UROLOGY SET (TUBING) ×2 IMPLANT
WATER STERILE IRR 500ML POUR (IV SOLUTION) ×1 IMPLANT

## 2022-06-19 NOTE — Anesthesia Postprocedure Evaluation (Signed)
Anesthesia Post Note  Patient: Wesley [name]  Procedure(s) Performed: CYSTOSCOPY WITH OPTILUME URETHRAL DILATATION (Urethra)     Patient location during evaluation: PACU Anesthesia Type: General Level of consciousness: sedated Pain management: pain level controlled Vital Signs Assessment: post-procedure vital signs reviewed and stable Respiratory status: spontaneous breathing and respiratory function stable Cardiovascular status: stable Postop Assessment: no apparent nausea or vomiting Anesthetic complications: no   No notable events documented.  Last Vitals:  Vitals:   06/19/22 1115 06/19/22 1148  BP: (!) 168/83 (!) 147/77  Pulse: 70 63  Resp: 15 15  Temp:  (!) 36.4 C  SpO2: 99% 98%    Last Pain:  Vitals:   06/19/22 1148  TempSrc:   PainSc: 1                  Dametria Tuzzolino DANIEL

## 2022-06-19 NOTE — H&P (Signed)
CC/HPI: cc: Urolithiasis, UTI follow-up   Prostate Cancer - patient's PSA on 04/21/2020 is 0.015. He was treated with brachytherapy and external beam.  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 No further surveillance imaging needed.  ED - patient uses sildenafil which helps. He will let me know when he needs a refill.  BPH - patient using tamsulosin and finasteride without difficulty. His urinary symptoms are stable.  Low testosterone - patient uses testosterone replacement and most recent testosterone levels were in the 500s.   04/18/2022: 71 year old man with history of prostate cancer treated with brachytherapy and external beam found to have extruded seeds and dystrophic calcifications but most recently with recurrent UTIs and multiple right distal ureteral calculi. He underwent ureteroscopy and subsequent stent placement with stent removal. A week following stent removal he developed a fever and symptoms consistent with infection. He is currently completing a course of antibiotics and feels better.   05/07/2022: 71 year old man with the above history. He recently completed a course of antibiotics and is feeling better. He is voiding every hour and has increased frequency and urgency. No fevers or gross hematuria.   06/06/2022: 71 year old man with history of prostate cancer, BPH, ED and low testosterone here for follow-up. Patient has had extruded brachytherapy seeds and dystrophic calcification, recurrent UTIs and urolithiasis over the last few months. 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 Patient's urinary frequency/urgency and urge incontinence has worsened over the last month. He remains on suppressive antibiotic therapy with 250 mg of cephalexin daily.     ALLERGIES: Sulfa Drugs    MEDICATIONS: Androgel 20.25 mg/1.25 gram per actuation (1.62 %) gel in metered-dose pump Apply 4  pumps daily to inner thigh  Celecoxib 100 mg capsule 1 capsule PO Bedtime  Finasteride 5 mg tablet 1 tablet PO Daily  Sildenafil Citrate 20 mg tablet 1-2 tablet PO PRN  Tamsulosin Hcl 0.4 mg capsule 1 capsule PO Q HS  Amlodipine Besylate 5 mg tablet Oral  Cephalexin 250 mg capsule 1 capsule PO Daily  Ciclopirox 0.77 % cream External  Dicyclomine Hcl 20 mg tablet  Doxepin Hcl 50 mg capsule  LevETIRAcetam 500 MG Oral Tablet Oral  Oxtellar Xr 600 mg tablet, extended release 24 hr Oral  Stelara     GU PSH: Cysto Bladder Stone <2.5cm - 08/23/2020, 2020 Cysto Bladder Ureth Biopsy - 2013 Cysto Dilate Stricture (M or F) - 03/13/2021, 08/23/2020, 2020 Cysto Remove Stent FB Sim - 04/06/2022 Cystoscopy - 12/14/2021, 03/10/2021, 08/11/2020, 2020 Cystoscopy TURBT <2 cm - 08/23/2020 Cystoscopy Ureteroscopy - 2010 Locm 300-'399Mg'$ /Ml Iodine,1Ml - 2020, 2020 TRANSPERI NEEDLE PLACE, PROS - 2015 Ureteroscopic laser litho - 03/13/2022       PSH Notes: Inguinal Hernia Repair, Surgery Prostate Transperineal Placement Of Needles, Cystoscopy With Biopsy, Craniotomy Suboccipital Excision Of Meningioma, Cystoscopy With Ureteroscopy Left, Arthroscopy Knee Right   NON-GU PSH: Knee Arthroscopy; Dx - 2009 Remove Brain Lining Lesion - 2011     GU PMH: Renal calculus, Right - 05/29/2022, Right, He has nonobstructing right renal calculi. I do not feel that this in any way contributed to his gross hematuria, - 2020 BPH w/LUTS - 05/07/2022, - 01/16/2022, - 12/14/2021 (Stable), Patient to continue tamsulosin and finasteride., - 12/22/2020, Benign prostatic hyperplasia with urinary obstruction, - 2015 Bulbar urethral stricture - 05/07/2022, - 04/18/2022, - 01/16/2022, - 12/14/2021, - 11/28/2021, - 04/20/2021, - 03/28/2021, - 03/16/2021, -  03/10/2021, Patient is voiding well without difficulty. Will repeat PVR next visit., - 12/22/2020, - 09/13/2020, - 08/26/2020, Based on cystoscopy today would like to proceed with cystoscopy, balloon dilation of  urethral stricture, and laser lithotripsy of stones. I discussed the risks and benefits of the procedure including but not limited to pain, bleeding, recurrence of stricture, damage to surrounding structures including urethra and bladder, need for Foley catheter or additional procedures after this. Patient understands and wishes to proceed., - 08/11/2020, I discovered a moderate bulbar urethral stricture that would not allow passage of the flexible cystoscope even with pressure and I did not want to cause him any discomfort in the office since I am going to have to take care of his bladder calculi as well so we discussed performing dilation at the time of his cystolitholapaxy., - 2020 Calculus of LUT, Unspec - 05/07/2022, - 08/26/2020, - 08/11/2020 History of prostate cancer - 05/07/2022, - 04/18/2022, - 01/16/2022, - 12/14/2021, - 03/16/2021, Repeat PSA in July 2022, - 12/22/2020, - 09/13/2020, He has nothing to suggest recurrence with no abnormality on DRE and a PSA that remains undetectable., - 2021 (Stable), Although he has a history of prostate cancer treated with radiation I told him I was not particularly concerned about the possibility of this being secondary to prostate cancer but rather more likely secondary to the effects of radiation., - 2020 (Stable), His prostate cancer remains under excellent control with no abnormality noted on DRE and PSA that remains undetectable., - 2019 (Stable), His erectile dysfunction is likely multifactorial but most likely secondary to the treatment of his prostate cancer with radiation. Also up with me as previously scheduled for his annual prostate cancer follow-up appointment, - 2019 (Stable), At this point it appears his prostate cancer has been well controlled with a PSA that is currently 0.017. I will continue to see him on a yearly basis., - 2018 (Stable), His prostate remains flat and benign to examination. His PSA remains undetectable. I will continue to see him on a yearly  basis for DRE and PSA., - 2017, History of prostate cancer, - 2016 Hydronephrosis - 05/07/2022 Urinary Urgency - 05/07/2022, - 04/20/2021, - 09/13/2020 (Worsening), He uses DDAVP at night but still is having some nocturia and also is having some urgency and therefore I have recommended a trial of Toviaz to see if he notes this helps any better than the Myrbetriq., - 2019 Acute Cystitis/UTI - 04/18/2022, - 02/22/2022, - 11/28/2021 Ureteral calculus - 04/18/2022, - 04/06/2022 (Stable), - 03/02/2022, Ureteral Stone, - 2014 Gross hematuria - 03/02/2022, - 01/05/2022, - 09/13/2020, He is experienced gross hematuria and has had previous ionizing radiation and therefore a full evaluation will be undertaken. He has had a history of stones as well so I will obtain a CT scan to evaluate the upper tract and then he will return for completion of his workup with cystoscopy., - 2020 Bladder Stone - 01/16/2022, - 01/05/2022 (Stable), - 12/14/2021, The source of his gross hematuria is almost certainly the stones located within his bladder on CT scan. I was not able to visualize these due to his stricture., - 2020 Weak Urinary Stream - 04/20/2021, - 03/03/2021 Incomplete bladder emptying - 03/03/2021 Microscopic hematuria, Patient had gross hematuria workup late last year. Will continue to monitor this. - 12/22/2020 Nocturia - 12/22/2020, - 09/13/2020, Nocturia, - 2016 Renal cyst, Right, No further imaging necessary for simple cyst - 12/22/2020, Right, Has Bosniak 2 F cysts. It was suggested that further characterization may be  attained with an MRI scan so I will schedule him for that in 6 months., - 2021, Renal cysts, acquired, bilateral, - 2014 Primary hypogonadism, This is managed with AndroGel 3 pumps. He will remain on this. - 2021, (Stable), We discussed the fact that his serum testosterone level was found to be above the reference range. He felt that that was the day that he used to of the packets so what I have recommended is he  decreased the dosage down to 1 packet daily., - 2019, Hypogonadism, testicular, - 2016 ED due to arterial insufficiency (Worsening), He is going to use sildenafil 20 mg titrating up to 100 mg as needed. - 2019 Urinary Frequency, He is having some new frequency and his nocturia has increased as well. He is using tamsulosin and also Celebrex which resulted in resolution of his nocturia but what I have recommended is a trial of Myrbetriq 25 mg. I have given him samples of this. - 2018, Increased urinary frequency, - 2014 Unil Inguinal Hernia W/O obst or gang,non-recurrent, Right inguinal hernia - 2015 Elevated PSA, PSA,Elevated - 2014 Peyronies Disease, Peyronie's disease - 2014 Urinary Retention, Unspec, Incomplete bladder emptying - 2014      PMH Notes: Prostate cancer:  Date of diagnosis: 10/29/08  Pretreatment PSA: 5.2  Gleason score: 4+3=7, 5/12 cores positive with only one core positive for Gleason 4+3.  Stage - T1c  Treatment: External Beam Radiation 3-4/10 / Brachytherapy 03/04/09   Nephrolithiasis: In 7/10 he underwent a CT scan because of abdominal complaints and was found to have a 3 mm distal right ureteral calculus but no renal calculi were identified. He subsequently passed right ureteral stone. In 10/10 he underwent left ureteroscopy for what appeared to be a stone overlying the left ureter without progression. The stone was located over the sacral region however at the time of his retrograde pyelogram and ureteroscopy no stone could be identified within the ureter.  CT scan in the 5/20 revealed a right renal calculi with no left renal stones noted.   Nocturia: He had noted nocturia as well as some daytime frequency and had been treated with a 5 alpha reductase inhibitor as well as an alpha-blocker but remains symptomatic. I tried DDAVP however this resulted in worsening of his hesitancy he did not want to remain on this therapy. I had prescribed anticholinergics in the past but he was  reluctant to reinitiate this form of therapy because of his history of ulcerative colitis but once that was under control he elected to undergo a trial of VESIcare at a 10 mg dose which was ineffective. I recommended DDAVP which was started in 5/13.   Hypogonadism: He was found to have hypogonadism with a low free testosterone level of 5.5 in 2/11. Initially I recommended he not undergo testosterone replacement since he had just been diagnosed with prostate cancer. Now that he has shown a significant fall and stabilization of his PSA I therefore initiated testosterone replacement therapy in 5/13.  Current therapy: AndroGel 4 pumps q.a.m.   BPH with outlet obstruction: He developed hematuria with clot retention and eventually voided without difficulty but I placed him on finasteride (5/13) in order to prevent this from occurring in the future.  Current therapy: Tamsulosin and finasteride.   Gross hematuria: He initially experienced gross hematuria in 2/20. A CT scan in 5/20 revealed no abnormality of the upper tract and bladder calculi. Cystoscopy revealed a bulbar urethral stricture.   Bulbar urethral stricture: This was discovered at the  time of cystoscopy and treated with balloon dilatation on 03/13/19.   Bilateral renal cysts: He was found have bilateral renal cysts by CT scan in 5/20. There were simple with a Bosniak 9F cyst on the right.   Bladder calculi: He was found to have bladder calculi in underwent cystolitholapaxy on 03/13/19.     NON-GU PMH: Encounter for general adult medical examination without abnormal findings, Encounter for preventive health examination - 2014/01/05 Personal history of other diseases of the circulatory system, History of hypertension - 01/05/13 Personal history of other endocrine, nutritional and metabolic disease, History of hypercholesterolemia - January 05, 2013 Personal history of other specified conditions, History of heartburn - Jan 05, 2013    FAMILY HISTORY: Death In The Family  Father - Father Death In The Family Mother - Mother Family Health Status Number - Runs In Family   SOCIAL HISTORY: Marital Status: Married Preferred Language: English; Ethnicity: Not Hispanic Or Latino; Race: White Current Smoking Status: Patient does not smoke anymore.   Tobacco Use Assessment Completed: Used Tobacco in last 30 days? Does drink.  Drinks 3 caffeinated drinks per day.     Notes: Former smoker, Caffeine Use, Marital History - Currently Married, Alcohol Use, Tobacco Use, Occupation:   REVIEW OF SYSTEMS:    GU Review Male:   Patient reports frequent urination and get up at night to urinate. Patient denies stream starts and stops, leakage of urine, have to strain to urinate , erection problems, trouble starting your stream, penile pain, burning/ pain with urination, and hard to postpone urination.  Gastrointestinal (Upper):   Patient denies nausea, vomiting, and indigestion/ heartburn.  Gastrointestinal (Lower):   Patient denies diarrhea and constipation.  Constitutional:   Patient denies fever, night sweats, weight loss, and fatigue.  Skin:   Patient denies skin rash/ lesion and itching.  Eyes:   Patient denies blurred vision and double vision.  Ears/ Nose/ Throat:   Patient denies sore throat and sinus problems.  Hematologic/Lymphatic:   Patient denies swollen glands and easy bruising.  Cardiovascular:   Patient denies leg swelling and chest pains.  Respiratory:   Patient denies cough and shortness of breath.  Endocrine:   Patient denies excessive thirst.  Musculoskeletal:   Patient denies back pain and joint pain.  Neurological:   Patient denies headaches and dizziness.  Psychologic:   Patient denies depression and anxiety.   VITAL SIGNS: None   MULTI-SYSTEM PHYSICAL EXAMINATION:    Constitutional: Well-nourished. No physical deformities. Normally developed. Good grooming.  Neck: Neck symmetrical, not swollen. Normal tracheal position.  Respiratory: No labored  breathing, no use of accessory muscles.   Skin: No paleness, no jaundice, no cyanosis. No lesion, no ulcer, no rash.  Neurologic / Psychiatric: Oriented to time, oriented to place, oriented to person. No depression, no anxiety, no agitation.  Eyes: Normal conjunctivae. Normal eyelids.  Ears, Nose, Mouth, and Throat: Left ear no scars, no lesions, no masses. Right ear no scars, no lesions, no masses. Nose no scars, no lesions, no masses. Normal hearing. Normal lips.  Musculoskeletal: Normal gait and station of head and neck.     Complexity of Data:  Records Review:   Previous Patient Records, POC Tool  Urine Test Review:   Urinalysis  X-Ray Review: C.T. Abdomen/Pelvis: Reviewed Films. Reviewed Report. Discussed With Patient. IMPRESSION:  1. Mild right-sided hydronephrosis now with normal caliber ureter  suggestive of UPJ stricture/obstruction. No obstructive ureteral  stone identified. Interval passage of the distal right ureteral  stones.  2. Nonobstructive 10  mm right renal stone.  3. Fiducial markers and brachytherapy seeds in the prostate gland.  4. Aortic Atherosclerosis (ICD10-I70.0).    Electronically Signed  By: Dahlia Bailiff M.D.  On: 05/30/2022 13:14    04/06/22 04/13/21 04/21/20 09/29/19 09/23/18 09/13/17 05/30/17 09/18/16  PSA  Total PSA <0.015 ng/mL <0.015 ng/mL <0.015 ng/mL <0.015 ng/mL <0.015 ng/mL 0.017 ng/mL <0.015 ng/dL < 0.015     04/21/20 09/25/19 09/23/18 05/30/17 04/05/14 03/03/14 08/21/13 08/19/12  Hormones  Testosterone, Total 595.4 ng/dL 705.9 ng/dL 1350.7 ng/dL 543.9 ng/dL 467  273  280  478.95     PROCEDURES:         Flexible Cystoscopy - 52000  Risks, benefits, and some of the potential complications of the procedure were discussed at length with the patient including infection, bleeding, voiding discomfort, urinary retention, fever, chills, sepsis, and others. All questions were answered. Informed consent was obtained. Sterile technique and  intraurethral analgesia were used.  Meatus:  Normal size. Normal location. Normal condition.  Urethra:  Mild bulbous stricture preventing cystoscope from traversing      The lower urinary tract was carefully examined until the cysotscope was limited by bulbar urethral stricture. The cystoscope was removed as it could not pass strictured area. The procedure was well-tolerated and without complications. Antibiotic instructions were given. Instructions were given to call the office immediately for bloody urine, difficulty urinating, urinary retention, painful or frequent urination, fever, chills, nausea, vomiting or other illness. The patient stated that he understood these instructions and would comply with them.         Urinalysis w/Scope Dipstick Dipstick Cont'd Micro  Color: Yellow Bilirubin: Neg mg/dL WBC/hpf: 20 - 40/hpf  Appearance: Slightly Cloudy Ketones: Neg mg/dL RBC/hpf: 10 - 20/hpf  Specific Gravity: 1.025 Blood: 3+ ery/uL Bacteria: Mod (26-50/hpf)  pH: <=5.0 Protein: Trace mg/dL Cystals: NS (Not Seen)  Glucose: Neg mg/dL Urobilinogen: 0.2 mg/dL Casts: NS (Not Seen)    Nitrites: Neg Trichomonas: Not Present    Leukocyte Esterase: 2+ leu/uL Mucous: Not Present      Epithelial Cells: 0 - 5/hpf      Yeast: NS (Not Seen)      Sperm: Not Present    Notes: Renal Tubular epi's present    ASSESSMENT:      ICD-10 Details  1 GU:   Renal calculus - N20.0 Chronic, Stable  2   Bulbar urethral stricture - N35.011 Chronic, Worsening  3   Urinary Frequency - R35.0 Chronic, Worsening  4   Urinary Urgency - R39.15 Chronic, Worsening  5   Urge incontinence - N39.41 Chronic, Worsening   PLAN:           Document Letter(s):  Created for Patient: Clinical Summary         Notes:   1. Right renal calculus:  -Patient continues to have bacteriuria even on suppressive antibiotic therapy  -We discussed addressing right renal calculus at same time as bulbar urethral stricture  -Patient  understands risks and benefits of right ureteroscopy with laser lithotripsy and stent placement we will also do right retrograde pyelogram to assess hydronephrosis versus parapelvic cysts   2. Bulbar urethral stricture:  -Unable to traverse cystoscope past bulbar urethra  -Discussed proceeding with OptiLume balloon dilation in the operating room  -Risks and benefits of the procedure discussed with the patient in detail including the need to have a Foley catheter postoperatively and wear a condom for sexual activity for 30 days   3. Worsening frequency/urgency/urge incontinence: This may  be in part due to urethral stricture however patient also has had extruded brachytherapy seeds previously and may also benefit from a urodynamic study to evaluate for overactive bladder.   Schedule next available surgery date

## 2022-06-19 NOTE — Transfer of Care (Signed)
Immediate Anesthesia Transfer of Care Note  Patient: Wesley [name]  Procedure(s) Performed: CYSTOSCOPY WITH OPTILUME URETHRAL DILATATION (Urethra)  Patient Location: PACU  Anesthesia Type:General  Level of Consciousness: awake and alert   Airway & Oxygen Therapy: Patient Spontanous Breathing  Post-op Assessment: Report given to RN and Post -op Vital signs reviewed and stable  Post vital signs: Reviewed and stable  Last Vitals:  Vitals Value Taken Time  BP 149/74 06/19/22 1045  Temp    Pulse 70 06/19/22 1045  Resp 8 06/19/22 1045  SpO2 95 % 06/19/22 1045  Vitals shown include unvalidated device data.  Last Pain:  Vitals:   06/19/22 0734  TempSrc: Oral  PainSc: 0-No pain      Patients Stated Pain Goal: 3 (25/67/20 9198)  Complications: No notable events documented.

## 2022-06-19 NOTE — Discharge Instructions (Addendum)
Cystoscopy with Optilume patient instructions  Following a cystoscopy, a catheter (a flexible rubber tube) is sometimes left in place to empty the bladder. This may cause some discomfort or a feeling that you need to urinate. Your doctor determines the period of time that the catheter will be left in place. You may have bloody urine for two to three days (Call your doctor if the amount of bleeding increases or does not subside).  You may pass blood clots in your urine, especially if you had a biopsy. It is not unusual to pass small blood clots and have some bloody urine a couple of weeks after your cystoscopy. Again, call your doctor if the bleeding does not subside. You may have: Dysuria (painful urination) Frequency (urinating often) Urgency (strong desire to urinate)  You must use a condom for 30 days following removal of foley.  These symptoms are common especially if medicine is instilled into the bladder or a ureteral stent is placed. Avoiding alcohol and caffeine, such as coffee, tea, and chocolate, may help relieve these symptoms. Drink plenty of water, unless otherwise instructed. Your doctor may also prescribe an antibiotic or other medicine to reduce these symptoms.  Cystoscopy results are available soon after the procedure; biopsy results usually take two to four days. Your doctor will discuss the results of your exam with you. Before you go home, you will be given specific instructions for follow-up care. Special Instructions:   If you are going home with a catheter in place do not take a tub bath until removed by your doctor.   You may resume your normal activities.   Do not drive or operate machinery if you are taking narcotic pain medicine.   Be sure to keep all follow-up appointments with your doctor.   Call Your Doctor If: The catheter is not draining You have severe pain You are unable to urinate You have a fever over 101 You have severe bleeding          Post  Anesthesia Home Care Instructions  Activity: Get plenty of rest for the remainder of the day. A responsible individual must stay with you for 24 hours following the procedure.  For the next 24 hours, DO NOT: -Drive a car -Paediatric nurse -Drink alcoholic beverages -Take any medication unless instructed by your physician -Make any legal decisions or sign important papers.  Meals: Start with liquid foods such as gelatin or soup. Progress to regular foods as tolerated. Avoid greasy, spicy, heavy foods. If nausea and/or vomiting occur, drink only clear liquids until the nausea and/or vomiting subsides. Call your physician if vomiting continues.  Special Instructions/Symptoms: Your throat may feel dry or sore from the anesthesia or the breathing tube placed in your throat during surgery. If this causes discomfort, gargle with warm salt water. The discomfort should disappear within 24 hours.

## 2022-06-19 NOTE — Anesthesia Preprocedure Evaluation (Addendum)
Anesthesia Evaluation  Patient identified by MRN, date of birth, ID band Patient awake    Reviewed: Allergy & Precautions, NPO status , Patient's Chart, lab work & pertinent test results  History of Anesthesia Complications Negative for: history of anesthetic complications  Airway Mallampati: II  TM Distance: >3 FB Neck ROM: Full    Dental no notable dental hx. (+) Dental Advisory Given   Pulmonary neg pulmonary ROS, former smoker,    Pulmonary exam normal        Cardiovascular hypertension, Pt. on medications Normal cardiovascular exam     Neuro/Psych  Headaches, Seizures -,  negative psych ROS   GI/Hepatic Neg liver ROS, PUD, GERD  ,Ulcerative colitis   Endo/Other  negative endocrine ROS  Renal/GU Renal disease  negative genitourinary   Musculoskeletal negative musculoskeletal ROS (+)   Abdominal   Peds negative pediatric ROS (+)  Hematology negative hematology ROS (+)   Anesthesia Other Findings   Reproductive/Obstetrics negative OB ROS                            Anesthesia Physical  Anesthesia Plan  ASA: 3  Anesthesia Plan: General   Post-op Pain Management: Tylenol PO (pre-op)* and Toradol IV (intra-op)*   Induction: Intravenous  PONV Risk Score and Plan: 2 and Treatment may vary due to age or medical condition, Ondansetron and Dexamethasone  Airway Management Planned: LMA  Additional Equipment: None  Intra-op Plan:   Post-operative Plan: Extubation in OR  Informed Consent: I have reviewed the patients History and Physical, chart, labs and discussed the procedure including the risks, benefits and alternatives for the proposed anesthesia with the patient or authorized representative who has indicated his/her understanding and acceptance.     Dental advisory given  Plan Discussed with: Anesthesiologist and CRNA  Anesthesia Plan Comments:        Anesthesia  Quick Evaluation

## 2022-06-19 NOTE — Anesthesia Procedure Notes (Signed)
Procedure Name: LMA Insertion Date/Time: 06/19/2022 9:41 AM  Performed by: Clearnce Sorrel, CRNAPre-anesthesia Checklist: Patient identified, Emergency Drugs available, Suction available and Patient being monitored Patient Re-evaluated:Patient Re-evaluated prior to induction Oxygen Delivery Method: Circle System Utilized Preoxygenation: Pre-oxygenation with 100% oxygen Induction Type: IV induction Ventilation: Mask ventilation without difficulty LMA: LMA inserted LMA Size: 4.0 Number of attempts: 1 Airway Equipment and Method: Bite block Placement Confirmation: positive ETCO2 Tube secured with: Tape Dental Injury: Teeth and Oropharynx as per pre-operative assessment

## 2022-06-19 NOTE — Interval H&P Note (Signed)
History and Physical Interval Note:  06/19/2022 9:28 AM  Wesley [name]  has presented today for surgery, with the diagnosis of URETHRAL STRICTURE, RENAL CALCULUS.  The various methods of treatment have been discussed with the patient and family. After consideration of risks, benefits and other options for treatment, the patient has consented to  Procedure(s) with comments: CYSTOSCOPY WITH RETROGRADE PYELOGRAM, URETEROSCOPY AND STENT PLACEMENT (Right) - 58 MINS HOLMIUM LASER APPLICATION (Right) CYSTOSCOPY WITH OPTILUME URETHRAL DILATATION (N/A) as a surgical intervention.  The patient's history has been reviewed, patient examined, no change in status, stable for surgery.  I have reviewed the patient's chart and labs.  Questions were answered to the patient's satisfaction.     Amalya Salmons D Raynesha Tiedt

## 2022-06-19 NOTE — Op Note (Signed)
Operative Note  Preoperative diagnosis:  1.  Bulbar urethral stricture 2.  Right renal calculi  Postoperative diagnosis: 1.  Bulbar urethral stricture 2.  Right renal calculi  Procedure(s): 1.  Cystoscopy, urethral balloon dilation with Optilume  Surgeon: Jacalyn Lefevre, MD  Assistants:  None  Anesthesia:  General  Complications:  None  EBL:  minimal  Specimens: 1. none  Drains/Catheters: 1.  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 male with bulbar urethral stricture, right renal calculi, and right hydronephrosis.  Description of procedure:  After risks and benefits were discussed, informed sent was obtained.  The patient taken the operating room placed in supine position.  Anesthesia induced and antibiotics were administered.  The patient was repositioned in the dorsolithotomy position.  He was prepped and draped in usual sterile fashion and timeout performed.  Urethral sounds were used to dilate the urethral meatus from 18 [place] to 26 [place].  A 21 French rigid cystoscope was then placed in the urethra meatus and advanced to the bulbar urethra at which time the stricture was seen.  It was noted to be more dense than previously seen on flexible cystoscopy.  A 0.38 sensor wire was then placed through the opening and advanced to the bladder with fluoroscopic guidance.  Next the Ultraxx urethral balloon dilator was used to dilate the stricture to 24 [place].  The balloon was deflated and the balloon dilator was removed.  The 21 French rigid cystoscope was then easily passed into the bladder.  Patient's prostatic urethra was noted to be fixed, with white  necrotic tissue and a very friable/oozing bladder neck.  The bladder neck limited movement of the cystoscope.  The right ureteral orifice was not clearly identified.  The location of the right ureteral orifice was probed gently with a 0.38 sensor wire however there is no success.  The patient's bladder neck was fixed and friable.  The decision was made to end the case.  The cystoscope was removed.  A 16 French council tip Foley was then placed over the wire and advanced into the bladder until it was hubbed.  The wire was removed and the Foley was inflated with 15 cc of sterile water.  The patient emerged from anesthesia and transferred the PACU in stable condition.  Plan:  Discharge home with foley

## 2022-06-20 ENCOUNTER — Encounter (HOSPITAL_BASED_OUTPATIENT_CLINIC_OR_DEPARTMENT_OTHER): Payer: Self-pay | Admitting: Urology

## 2022-06-22 DIAGNOSIS — N281 Cyst of kidney, acquired: Secondary | ICD-10-CM | POA: Diagnosis not present

## 2022-06-22 DIAGNOSIS — N200 Calculus of kidney: Secondary | ICD-10-CM | POA: Diagnosis not present

## 2022-06-22 DIAGNOSIS — N130 Hydronephrosis with ureteropelvic junction obstruction: Secondary | ICD-10-CM | POA: Diagnosis not present

## 2022-06-25 ENCOUNTER — Encounter (HOSPITAL_BASED_OUTPATIENT_CLINIC_OR_DEPARTMENT_OTHER): Payer: Self-pay | Admitting: Urology

## 2022-06-27 DIAGNOSIS — N35011 Post-traumatic bulbous urethral stricture: Secondary | ICD-10-CM | POA: Diagnosis not present

## 2022-06-27 DIAGNOSIS — R350 Frequency of micturition: Secondary | ICD-10-CM | POA: Diagnosis not present

## 2022-06-27 DIAGNOSIS — N130 Hydronephrosis with ureteropelvic junction obstruction: Secondary | ICD-10-CM | POA: Diagnosis not present

## 2022-06-27 DIAGNOSIS — Z8546 Personal history of malignant neoplasm of prostate: Secondary | ICD-10-CM | POA: Diagnosis not present

## 2022-06-29 DIAGNOSIS — K5730 Diverticulosis of large intestine without perforation or abscess without bleeding: Secondary | ICD-10-CM | POA: Diagnosis not present

## 2022-06-29 DIAGNOSIS — C61 Malignant neoplasm of prostate: Secondary | ICD-10-CM | POA: Diagnosis not present

## 2022-06-29 DIAGNOSIS — N1339 Other hydronephrosis: Secondary | ICD-10-CM | POA: Diagnosis not present

## 2022-06-29 DIAGNOSIS — N132 Hydronephrosis with renal and ureteral calculous obstruction: Secondary | ICD-10-CM | POA: Diagnosis not present

## 2022-07-04 DIAGNOSIS — R350 Frequency of micturition: Secondary | ICD-10-CM | POA: Diagnosis not present

## 2022-07-11 ENCOUNTER — Other Ambulatory Visit (HOSPITAL_COMMUNITY): Payer: Self-pay | Admitting: Urology

## 2022-07-11 DIAGNOSIS — N1339 Other hydronephrosis: Secondary | ICD-10-CM

## 2022-07-16 DIAGNOSIS — I2510 Atherosclerotic heart disease of native coronary artery without angina pectoris: Secondary | ICD-10-CM | POA: Diagnosis not present

## 2022-07-16 DIAGNOSIS — Z79899 Other long term (current) drug therapy: Secondary | ICD-10-CM | POA: Diagnosis not present

## 2022-07-16 DIAGNOSIS — Z960 Presence of urogenital implants: Secondary | ICD-10-CM | POA: Diagnosis not present

## 2022-07-16 DIAGNOSIS — M1612 Unilateral primary osteoarthritis, left hip: Secondary | ICD-10-CM | POA: Diagnosis not present

## 2022-07-16 DIAGNOSIS — Z8669 Personal history of other diseases of the nervous system and sense organs: Secondary | ICD-10-CM | POA: Diagnosis not present

## 2022-07-16 DIAGNOSIS — G250 Essential tremor: Secondary | ICD-10-CM | POA: Diagnosis not present

## 2022-07-16 DIAGNOSIS — Z9889 Other specified postprocedural states: Secondary | ICD-10-CM | POA: Diagnosis not present

## 2022-07-16 DIAGNOSIS — M898X7 Other specified disorders of bone, ankle and foot: Secondary | ICD-10-CM | POA: Diagnosis not present

## 2022-07-16 DIAGNOSIS — K5190 Ulcerative colitis, unspecified, without complications: Secondary | ICD-10-CM | POA: Diagnosis not present

## 2022-07-16 DIAGNOSIS — M898X8 Other specified disorders of bone, other site: Secondary | ICD-10-CM | POA: Diagnosis not present

## 2022-07-16 DIAGNOSIS — N200 Calculus of kidney: Secondary | ICD-10-CM | POA: Diagnosis not present

## 2022-07-16 DIAGNOSIS — L03031 Cellulitis of right toe: Secondary | ICD-10-CM | POA: Diagnosis not present

## 2022-07-16 DIAGNOSIS — G40209 Localization-related (focal) (partial) symptomatic epilepsy and epileptic syndromes with complex partial seizures, not intractable, without status epilepticus: Secondary | ICD-10-CM | POA: Diagnosis not present

## 2022-07-16 DIAGNOSIS — I10 Essential (primary) hypertension: Secondary | ICD-10-CM | POA: Diagnosis not present

## 2022-07-16 DIAGNOSIS — Z87891 Personal history of nicotine dependence: Secondary | ICD-10-CM | POA: Diagnosis not present

## 2022-07-17 ENCOUNTER — Other Ambulatory Visit: Payer: Self-pay | Admitting: Radiology

## 2022-07-17 DIAGNOSIS — N1339 Other hydronephrosis: Secondary | ICD-10-CM

## 2022-07-18 ENCOUNTER — Encounter (HOSPITAL_COMMUNITY): Payer: Self-pay

## 2022-07-18 ENCOUNTER — Ambulatory Visit (HOSPITAL_COMMUNITY)
Admission: RE | Admit: 2022-07-18 | Discharge: 2022-07-18 | Disposition: A | Discharge status: Home / Self Care | Payer: Medicare Other | Source: Ambulatory Visit | Attending: Urology | Admitting: Urology

## 2022-07-18 ENCOUNTER — Other Ambulatory Visit: Payer: Self-pay

## 2022-07-18 DIAGNOSIS — K219 Gastro-esophageal reflux disease without esophagitis: Secondary | ICD-10-CM | POA: Insufficient documentation

## 2022-07-18 DIAGNOSIS — N1339 Other hydronephrosis: Secondary | ICD-10-CM | POA: Diagnosis not present

## 2022-07-18 DIAGNOSIS — Z436 Encounter for attention to other artificial openings of urinary tract: Secondary | ICD-10-CM | POA: Diagnosis not present

## 2022-07-18 DIAGNOSIS — E785 Hyperlipidemia, unspecified: Secondary | ICD-10-CM | POA: Diagnosis not present

## 2022-07-18 DIAGNOSIS — I10 Essential (primary) hypertension: Secondary | ICD-10-CM | POA: Insufficient documentation

## 2022-07-18 HISTORY — PX: IR NEPHROSTOMY PLACEMENT RIGHT: IMG6064

## 2022-07-18 LAB — CBC WITH DIFFERENTIAL/PLATELET
Abs Immature Granulocytes: 0.01 10*3/uL (ref 0.00–0.07)
Basophils Absolute: 0.1 10*3/uL (ref 0.0–0.1)
Basophils Relative: 1 %
Eosinophils Absolute: 0.2 10*3/uL (ref 0.0–0.5)
Eosinophils Relative: 3 %
HCT: 40.9 % (ref 39.0–52.0)
Hemoglobin: 13.3 g/dL (ref 13.0–17.0)
Immature Granulocytes: 0 %
Lymphocytes Relative: 14 %
Lymphs Abs: 0.9 10*3/uL (ref 0.7–4.0)
MCH: 31.7 pg (ref 26.0–34.0)
MCHC: 32.5 g/dL (ref 30.0–36.0)
MCV: 97.4 fL (ref 80.0–100.0)
Monocytes Absolute: 0.5 10*3/uL (ref 0.1–1.0)
Monocytes Relative: 8 %
Neutro Abs: 5 10*3/uL (ref 1.7–7.7)
Neutrophils Relative %: 74 %
Platelets: 245 10*3/uL (ref 150–400)
RBC: 4.2 MIL/uL — ABNORMAL LOW (ref 4.22–5.81)
RDW: 13.8 % (ref 11.5–15.5)
WBC: 6.7 10*3/uL (ref 4.0–10.5)
nRBC: 0 % (ref 0.0–0.2)

## 2022-07-18 LAB — PROTIME-INR
INR: 1.1 (ref 0.8–1.2)
Prothrombin Time: 13.8 seconds (ref 11.4–15.2)

## 2022-07-18 LAB — BASIC METABOLIC PANEL
Anion gap: 6 (ref 5–15)
BUN: 30 mg/dL — ABNORMAL HIGH (ref 8–23)
CO2: 27 mmol/L (ref 22–32)
Calcium: 8.9 mg/dL (ref 8.9–10.3)
Chloride: 103 mmol/L (ref 98–111)
Creatinine, Ser: 1.95 mg/dL — ABNORMAL HIGH (ref 0.61–1.24)
GFR, Estimated: 36 mL/min — ABNORMAL LOW (ref >60)
Glucose, Bld: 113 mg/dL — ABNORMAL HIGH (ref 70–99)
Potassium: 4.7 mmol/L (ref 3.5–5.1)
Sodium: 136 mmol/L (ref 135–145)

## 2022-07-18 MED ORDER — FENTANYL CITRATE (PF) 100 MCG/2ML IJ SOLN
INTRAMUSCULAR | Status: AC
  Filled 2022-07-18: qty 4

## 2022-07-18 MED ORDER — MIDAZOLAM HCL 2 MG/2ML IJ SOLN
INTRAMUSCULAR | Status: AC
  Filled 2022-07-18: qty 4

## 2022-07-18 MED ORDER — IOHEXOL 300 MG/ML  SOLN
50.0000 mL | Freq: Once | INTRAMUSCULAR | Status: AC | PRN
  Administered 2022-07-18: 10 mL

## 2022-07-18 MED ORDER — LIDOCAINE HCL 1 % IJ SOLN
INTRAMUSCULAR | Status: AC
  Administered 2022-07-18: 10 mL via SUBCUTANEOUS
  Filled 2022-07-18: qty 20

## 2022-07-18 MED ORDER — MIDAZOLAM HCL 2 MG/2ML IJ SOLN
INTRAMUSCULAR | Status: AC | PRN
  Administered 2022-07-18 (×4): 1 mg via INTRAVENOUS

## 2022-07-18 MED ORDER — FENTANYL CITRATE (PF) 100 MCG/2ML IJ SOLN
INTRAMUSCULAR | Status: AC | PRN
  Administered 2022-07-18 (×4): 50 ug via INTRAVENOUS

## 2022-07-18 MED ORDER — SODIUM CHLORIDE 0.9 % IV SOLN: INTRAVENOUS | Status: DC

## 2022-07-18 MED ORDER — SODIUM CHLORIDE 0.9 % IV SOLN
2.0000 g | Freq: Once | INTRAVENOUS | Status: AC
  Administered 2022-07-18: 2 g via INTRAVENOUS
  Filled 2022-07-18: qty 20

## 2022-07-18 NOTE — Discharge Instructions (Addendum)
Please call Interventional Radiology clinic [phone] with any questions or concerns.  You may remove your dressing and shower tomorrow, change your dressing daily starting 07/19/2022.  Do not use scissors near the catheter.  If you leak from the insertion site, experience significant pain, nausea/vomiting, or fever attach your drainage bag and call Dr. Claudia Desanctis for further instructions.  The bag will be to gravity as back up.  Patient can keep capped unless new worsening pain, new fevers/rigors/chills or other concern for infection  Percutaneous Nephrostomy, Care After This sheet gives you information about how to care for yourself after your procedure. Your health care provider may also give you more specific instructions. If you have problems or questions, contact your health care provider. What can I expect after the procedure? After the procedure, it is common to have: Some soreness where the nephrostomy tube was inserted (tube insertion site). Blood-tinged drainage from the nephrostomy tube for the first 24 hours. Follow these instructions at home: Activity  Do not lift anything that is heavier than 10 lb (4.5 kg), or the limit that you are told, until your health care provider says that it is safe. Return to your normal activities as told by your health care provider. Ask your health care provider what activities are safe for you. Avoid activities that may cause the nephrostomy tubing to bend. Do not take baths, swim, or use a hot tub until your health care provider approves. Ask your health care provider if you can take showers. Cover the nephrostomy tube bandage (dressing) with a watertight covering when you take a shower. If you were given a sedative during the procedure, it can affect you for several hours. Do not drive or operate machinery until your health care provider says that it is safe. Care of the tube insertion site  Follow instructions from your health care provider about  how to take care of your tube insertion site. Make sure you: Wash your hands with soap and water for at least 20 seconds before you change your dressing. If soap and water are not available, use hand sanitizer. Change your dressing as told by your health care provider. Be careful not to pull on the tube while removing the dressing. When you change the dressing, wash the skin around the tube, rinse well, and pat the skin dry. Check the tube insertion area every day for signs of infection. Check for: Redness, swelling, or pain. Fluid or blood. Warmth. Pus or a bad smell. Care of the nephrostomy tube and drainage bag Always keep the tubing, the leg bag, or the bedside drainage bags below the level of the kidney so that your urine drains freely. When connecting your nephrostomy tube to a drainage bag, make sure that there are no kinks in the tubing and that your urine is draining freely. You may want to use an elastic bandage to wrap any exposed tubing that goes from the nephrostomy tube to any of the connecting tubes. At night, you may want to connect your nephrostomy tube or the leg bag to a larger bedside drainage bag. Follow instructions from your health care provider about how to empty or change the drainage bag. Empty the drainage bag when it becomes ? full. Replace the drainage bag and any extension tubing that is connected to your nephrostomy tube every 7 days or as told by your health care provider. Your health care provider will explain how to change the drainage bag and extension tubing. General instructions Take over-the-counter and  prescription medicines only as told by your health care provider. Keep all follow-up visits as told by your health care provider. This is important. The nephrostomy tube will need to be changed every 8-12 weeks. Contact a health care provider if: You have problems with any of the valves or tubing. You have persistent pain or soreness in your back. You have  redness, swelling, or pain around your tube insertion site. You have fluid or blood coming from your tube insertion site. Your tube insertion site feels warm to the touch. You have pus or a bad smell coming from your tube insertion site. You have increased urine output or you feel burning when urinating. Get help right away if: You have pain in your abdomen during the first week. You have chest pain or have trouble breathing. You have a new appearance of blood in your urine. You have a fever or chills. You have back pain that is not relieved by your medicine. You have decreased urine output. Your nephrostomy tube comes out. Summary After the procedure, it is common to have some soreness where the nephrostomy tube was inserted (tube insertion site). Follow instructions from your health care provider about how to take care of your tube insertion site, nephrostomy tube, and drainage bag. Keep all follow-up visits for care and for changing the tube. This information is not intended to replace advice given to you by your health care provider. Make sure you discuss any questions you have with your health care provider.   Moderate Conscious Sedation, Adult, Care After This sheet gives you information about how to care for yourself after your procedure. Your health care provider may also give you more specific instructions. If you have problems or questions, contact your health careprovider. What can I expect after the procedure? After the procedure, it is common to have: Sleepiness for several hours. Impaired judgment for several hours. Difficulty with balance. Vomiting if you eat too soon. Follow these instructions at home: For the time period you were told by your health care provider: Rest. Do not participate in activities where you could fall or become injured. Do not drive or use machinery. Do not drink alcohol. Do not take sleeping pills or medicines that cause drowsiness. Do not  make important decisions or sign legal documents. Do not take care of children on your own. Eating and drinking  Follow the diet recommended by your health care provider. Drink enough fluid to keep your urine pale yellow. If you vomit: Drink water, juice, or soup when you can drink without vomiting. Make sure you have little or no nausea before eating solid foods.  General instructions Take over-the-counter and prescription medicines only as told by your health care provider. Have a responsible adult stay with you for the time you are told. It is important to have someone help care for you until you are awake and alert. Do not smoke. Keep all follow-up visits as told by your health care provider. This is important. Contact a health care provider if: You are still sleepy or having trouble with balance after 24 hours. You feel light-headed. You keep feeling nauseous or you keep vomiting. You develop a rash. You have a fever. You have redness or swelling around the IV site. Get help right away if: You have trouble breathing. You have new-onset confusion at home. Summary After the procedure, it is common to feel sleepy, have impaired judgment, or feel nauseous if you eat too soon. Rest after you get home.  Know the things you should not do after the procedure. Follow the diet recommended by your health care provider and drink enough fluid to keep your urine pale yellow. Get help right away if you have trouble breathing or new-onset confusion at home. This information is not intended to replace advice given to you by your health care provider. Make sure you discuss any questions you have with your healthcare provider. Document Revised: 01/22/2020 Document Reviewed: 08/20/2019 Elsevier Patient Education  2022 [employer].

## 2022-07-18 NOTE — Discharge Instructions (Addendum)
Please call Interventional Radiology clinic [phone] with any questions or concerns.  You may remove your dressing and shower tomorrow.   Percutaneous Nephrostomy, Care After    This sheet gives you information about how to care for yourself after your procedure. Your health care provider may also give you more specific instructions. If you have problems or questions, contact your health care provider. What can I expect after the procedure? After the procedure, it is common to have: Some soreness where the nephrostomy tube was inserted (tube insertion site). Blood-tinged drainage from the nephrostomy tube for the first 24 hours. Follow these instructions at home: Activity  Do not lift anything that is heavier than 10 lb (4.5 kg), or the limit that you are told, until your health care provider says that it is safe. Return to your normal activities as told by your health care provider. Ask your health care provider what activities are safe for you. Avoid activities that may cause the nephrostomy tubing to bend. Do not take baths, swim, or use a hot tub until your health care provider approves. Ask your health care provider if you can take showers. Cover the nephrostomy tube bandage (dressing) with a watertight covering when you take a shower. If you were given a sedative during the procedure, it can affect you for several hours. Do not drive or operate machinery until your health care provider says that it is safe. Care of the tube insertion site  Follow instructions from your health care provider about how to take care of your tube insertion site. Make sure you: Wash your hands with soap and water for at least 20 seconds before you change your dressing. If soap and water are not available, use hand sanitizer. Change your dressing as told by your health care provider. Be careful not to pull on the tube while removing the dressing. When you change the dressing, wash the skin around the  tube, rinse well, and pat the skin dry. Check the tube insertion area every day for signs of infection. Check for: Redness, swelling, or pain. Fluid or blood. Warmth. Pus or a bad smell. Care of the nephrostomy tube and drainage bag Always keep the tubing, the leg bag, or the bedside drainage bags below the level of the kidney so that your urine drains freely. When connecting your nephrostomy tube to a drainage bag, make sure that there are no kinks in the tubing and that your urine is draining freely. You may want to use an elastic bandage to wrap any exposed tubing that goes from the nephrostomy tube to any of the connecting tubes. At night, you may want to connect your nephrostomy tube or the leg bag to a larger bedside drainage bag. Follow instructions from your health care provider about how to empty or change the drainage bag. Empty the drainage bag when it becomes ? full. Replace the drainage bag and any extension tubing that is connected to your nephrostomy tube every 7 days or as told by your health care provider. Your health care provider will explain how to change the drainage bag and extension tubing. General instructions Take over-the-counter and prescription medicines only as told by your health care provider. Keep all follow-up visits as told by your health care provider. This is important. The nephrostomy tube will need to be changed every 8-12 weeks. Contact a health care provider if: You have problems with any of the valves or tubing. You have persistent pain or soreness in your back. You  have redness, swelling, or pain around your tube insertion site. You have fluid or blood coming from your tube insertion site. Your tube insertion site feels warm to the touch. You have pus or a bad smell coming from your tube insertion site. You have increased urine output or you feel burning when urinating. Get help right away if: You have pain in your abdomen during the first  week. You have chest pain or have trouble breathing. You have a new appearance of blood in your urine. You have a fever or chills. You have back pain that is not relieved by your medicine. You have decreased urine output. Your nephrostomy tube comes out. Summary After the procedure, it is common to have some soreness where the nephrostomy tube was inserted (tube insertion site). Follow instructions from your health care provider about how to take care of your tube insertion site, nephrostomy tube, and drainage bag. Keep all follow-up visits for care and for changing the tube. This information is not intended to replace advice given to you by your health care provider. Make sure you discuss any questions you have with your health care provider.   Moderate Conscious Sedation, Adult, Care After This sheet gives you information about how to care for yourself after your procedure. Your health care provider may also give you more specific instructions. If you have problems or questions, contact your health careprovider. What can I expect after the procedure? After the procedure, it is common to have: Sleepiness for several hours. Impaired judgment for several hours. Difficulty with balance. Vomiting if you eat too soon. Follow these instructions at home: For the time period you were told by your health care provider: Rest. Do not participate in activities where you could fall or become injured. Do not drive or use machinery. Do not drink alcohol. Do not take sleeping pills or medicines that cause drowsiness. Do not make important decisions or sign legal documents. Do not take care of children on your own. Eating and drinking  Follow the diet recommended by your health care provider. Drink enough fluid to keep your urine pale yellow. If you vomit: Drink water, juice, or soup when you can drink without vomiting. Make sure you have little or no nausea before eating solid foods.  General  instructions Take over-the-counter and prescription medicines only as told by your health care provider. Have a responsible adult stay with you for the time you are told. It is important to have someone help care for you until you are awake and alert. Do not smoke. Keep all follow-up visits as told by your health care provider. This is important. Contact a health care provider if: You are still sleepy or having trouble with balance after 24 hours. You feel light-headed. You keep feeling nauseous or you keep vomiting. You develop a rash. You have a fever. You have redness or swelling around the IV site. Get help right away if: You have trouble breathing. You have new-onset confusion at home. Summary After the procedure, it is common to feel sleepy, have impaired judgment, or feel nauseous if you eat too soon. Rest after you get home. Know the things you should not do after the procedure. Follow the diet recommended by your health care provider and drink enough fluid to keep your urine pale yellow. Get help right away if you have trouble breathing or new-onset confusion at home. This information is not intended to replace advice given to you by your health care provider. Make sure  you discuss any questions you have with your healthcare provider. Document Revised: 01/22/2020 Document Reviewed: 08/20/2019 Elsevier Patient Education  2022 [employer].

## 2022-07-18 NOTE — Consult Note (Signed)
Chief Complaint: Patient was seen in consultation today for right percutaneous nephrostomy/possible nephroureteral catheter placement  Referring Physician(s): Pace,Maryellen D  Supervising Physician: Corrie Mckusick  Patient Status: [facility] - Out-pt  History of Present Illness: Demaryius Imran is a 71 y.o. male with past medical history of BPH, seizures, dyslipidemia, essential tremor, GERD, meningioma resection, hypertension, migraines, ulcerative colitis, diverticulosis, prostate cancer , renal cysts and nephrolithiasis.  Patient is status post cystoscopy with urethral balloon dilation on 06/19/2022 for renal calculi, bulbar urethral stricture and right hydronephrosis.  The right ureteral orifice could not be identified.  He presents today for right percutaneous nephrostomy with possible nephroureteral catheter placement.  Past Medical History:  Diagnosis Date   Atypical chest pain    cardiologist-- dr Mauricio Po;  chronic occasional,  pressure / discomfort, LOV 07/14/21 (as of 06/08/22)   Bladder calculus    BPH with obstruction/lower urinary tract symptoms    Follows with Dr. Jacalyn Lefevre, urologist.   Complex partial seizure [facility])    followed by neurologist--- dr d. 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 Bjorn Loser at [facility] neurology, [place] w/ FNP on 03/21/22 in Epic ( as of 06/08/22)   Generalized abdominal pain    w/  bloating due to UC  take doxepin   GERD (gastroesophageal reflux disease)    H/O urinary retention    History of bladder stone    History of kidney stones    History of prostate cancer 2010   urologist--- dr pace;   dx 2010,  Gleason 4+3, PSA 5.2;   03-04-2009  s/p radiactive prostate seed implants and external beam radiation   History of repair of hiatal hernia     02-11-2017  s/p lap paraesophageal hernia repair w/ nissen fundoplication   History of skin cancer    Hx of resection of meningioma 02/11/2009   benign right temperal lobe meningioma s/p resection  (residual hearing loss)   (neurosurgeon-- dr Jonelle Sports)   Hypertension    followed by cardiologist and pcp   Insomnia    Migraine with aura and without status migrainosus, not intractable    neurologist-- dr Hyman Bower   Simple renal cyst    bilateral ;   followed by urologist   Ulcerative colitis ([place])    followed by dr v. Erlene Quan (GI);  dx 2010;  current treatment stelara q8wks, LOV 06/07/22 (as of 06/08/22)   Wears glasses     Past Surgical History:  Procedure Laterality Date   BRAIN MENINGIOMA EXCISION  02/11/2009   '@[facility]'$ ;  craniotomy , right temporal medial sphonoid wing resection   CARPAL TUNNEL RELEASE Bilateral    right 09-05-2016  '@[facility]'$ ;   left 2013   COLONOSCOPY  12/2019   COLONOSCOPY  01/2022   CYSTOSCOPY  03/05/2012   Procedure: CYSTOSCOPY;  Surgeon: Fredricka Bonine, MD;  Location: [facility];  Service: Urology;  Laterality: N/A;  Cysto Clot Evacuation and Fulgeration Of [place] ([place])   CYSTOSCOPY WITH LITHOLAPAXY N/A 03/13/2019   Procedure: CYSTOSCOPY WITH lASER LITHOLAPAXY, DILATION OF STRICTURE;  Surgeon: Kathie Rhodes, MD;  Location: [place];  Service: Urology;  Laterality: N/A;   CYSTOSCOPY WITH LITHOLAPAXY N/A 01/02/2022   Procedure: CYSTOSCOPY WITH LITHOTRIPSY;  Surgeon: Robley Fries, MD;  Location: [facility];  Service: Urology;  Laterality: N/A;   CYSTOSCOPY WITH RETROGRADE PYELOGRAM, URETEROSCOPY AND STENT PLACEMENT Right 03/13/2022   Procedure: CYSTOSCOPY WITH RETROGRADE PYELOGRAM, URETEROSCOPY AND STENT PLACEMENT;  Surgeon: Robley Fries, MD;  Location: [facility];  Service: Urology;  Laterality: Right;  75 MINS   CYSTOSCOPY WITH URETHRAL DILATATION N/A 08/23/2020   Procedure: CYSTOSCOPY WITH URETHRAL  DILATATION, BLADDER BIOPSY, FULGERATION;  Surgeon: Robley Fries, MD;  Location: [place];  Service: Urology;  Laterality: N/A;  1 HR   CYSTOSCOPY WITH URETHRAL DILATATION N/A 03/13/2021   Procedure: CYSTOSCOPY WITH URETHRAL BALLOON DILATATION/ FOLEY CATHETER PLACEMENT;  Surgeon: Robley Fries, MD;  [facility];  Service: Urology;  Laterality: N/A;   CYSTOSCOPY WITH URETHRAL DILATATION Left 07/08/2009   '@[facility]'$ :   with left  RTG   CYSTOSCOPY WITH URETHRAL DILATATION N/A 06/19/2022   Procedure: CYSTOSCOPY WITH OPTILUME URETHRAL DILATATION;  Surgeon: Robley Fries, MD;  Location: [facility];  Service: Urology;  Laterality: N/A;   HOLMIUM LASER APPLICATION N/A 29/51/8841   Procedure: HOLMIUM LASER APPLICATION OF URETHRAL STONE;  Surgeon: Robley Fries, MD;  Location: [facility];  Service: Urology;  Laterality: N/A;   HOLMIUM LASER APPLICATION Right 66/03/3015   Procedure: HOLMIUM LASER APPLICATION;  Surgeon: Robley Fries, MD;  Location: [facility];  Service: Urology;  Laterality: Right;   INGUINAL HERNIA REPAIR Right 05/03/2015   '@[facility]'$    INSERTION PROSTATE RADIATION SEED  03/04/2009   '@[facility]'$    KNEE CARTILAGE SURGERY Left    1980S   LAPAROSCOPIC PARAESOPHAGEAL HERNIA REPAIR  02/11/2017   '@[facility]'$ ;  W/  NISSEN FUNDOPLICATION ([place])   TRANSURETHRAL RESECTION OF BLADDER TUMOR N/A 01/02/2022   Procedure: TRANSURETHRAL RESECTION OF BLADDER TUMOR (TURBT);  Surgeon: Robley Fries, MD;  Location: [facility];  Service: Urology;  Laterality: N/A;    Allergies: Sulfa antibiotics  Medications: Prior to Admission medications   Medication Sig Start Date End Date Taking? Authorizing Provider  amLODipine (NORVASC) 5 MG tablet Take 5 mg by mouth at bedtime.   Yes [provider]  celecoxib (CELEBREX) 100 MG capsule Take 100 mg by mouth at bedtime. 12/23/20  Yes [provider]   ciclopirox (LOPROX) 0.77 % cream Apply 1 application topically 2 (two) times daily as needed (dry/flaky skin).   Yes [provider]  Ciclopirox 1 % shampoo Apply 1 application topically daily as needed (dry/flaky skin).   Yes [provider]  doxepin (SINEQUAN) 25 MG capsule Take 25 mg by mouth at bedtime.   Yes [provider]  finasteride (PROSCAR) 5 MG tablet Take 5 mg by mouth daily at 12 noon. AFTERNOON   Yes [provider]  levETIRAcetam (KEPPRA) 500 MG tablet Take 500 mg by mouth 3 (three) times daily.   Yes [provider]  losartan (COZAAR) 50 MG tablet Take 50 mg by mouth daily.   Yes [provider]  OXcarbazepine ER (OXTELLAR XR) 600 MG TB24 Take 600 mg by mouth daily in the afternoon. AFTERNOON   Yes [provider]  primidone (MYSOLINE) 50 MG tablet Take by mouth at bedtime.   Yes [provider]  tamsulosin (FLOMAX) 0.4 MG CAPS capsule Take 1 capsule (0.4 mg total) by mouth daily. Patient taking differently: Take 0.4 mg by mouth at bedtime. 03/13/22  Yes Pace, Simone Curia D, MD  Trospium Chloride 60 MG CP24 Take 1 capsule by mouth daily. BRAND NAME:  Ryan   Yes [provider]  vitamin B-12 (CYANOCOBALAMIN) 1000 MCG tablet Take 1,000 mcg by mouth daily at 12 noon. AFTERNOON   Yes [provider]  cephALEXin (KEFLEX) 250 MG capsule Take by mouth daily.    [provider]  Cholecalciferol (VITAMIN D-3) 125 MCG (5000 UT) TABS Take 5,000 Units by mouth daily at 12 noon. AFTERNOON    [provider]  nitroGLYCERIN (NITROSTAT) 0.4 MG SL tablet Place 0.4 mg under the tongue every 5 (five) minutes x 3 doses as needed for chest pain.    [provider]  Tamsulosin HCl (FLOMAX) 0.4 MG CAPS Take 0.4 mg by mouth at bedtime. Duplicate entry.    [provider]  Testosterone 20.25 MG/ACT (1.62%) GEL Place 3 Pump onto the skin every evening. 3 pumps applied to shoulder  daily in the evening    [provider]  traMADol (ULTRAM) 50 MG tablet Take 1 tablet (50 mg total) by mouth every 6 (six) hours as needed. 03/13/22 03/13/23  Robley Fries, MD  ustekinumab (STELARA) 90 MG/ML SOSY injection Inject 90 mg into the skin every 8 (eight) weeks.    [provider]     History reviewed. No pertinent family history.  Social History   Socioeconomic History   Marital status: Married    Spouse name: Not on file   Number of children: Not on file   Years of education: Not on file   Highest education level: Not on file  Occupational History   Not on file  Tobacco Use   Smoking status: Former    Packs/day: 1.00    Years: 10.00    Total pack years: 10.00    Types: Cigarettes    Quit date: 03/02/1987    Years since quitting: 35.4   Smokeless tobacco: Never  Vaping Use   Vaping Use: Never used  Substance and Sexual Activity   Alcohol use: Not Currently    Comment: occasionally   Drug use: Never   Sexual activity: Not on file  Other Topics Concern   Not on file  Social History Narrative   Not on file   Social Determinants of Health   Financial Resource Strain: Not on file  Food Insecurity: Not on file  Transportation Needs: Not on file  Physical Activity: Not on file  Stress: Not on file  Social Connections: Not on file      Review of Systems patient denies fever, headache, chest pain, dyspnea, cough, abdominal/back pain, nausea, vomiting or bleeding.  Vital Signs: blood pressure 158/80, temp 99, heart rate 84, respirations 18, O2 sat 99% room air   Ht '5\' 8"'$  (1.727 m)   Wt 142 lb (64.4 kg)   BMI 21.59 kg/m      Physical Exam awake, alert.  Chest clear to auscultation bilaterally.  Heart with regular rate and rhythm.  Abdomen soft, positive bowel sounds, nontender.  No lower extremity edema.  Imaging: No results found.  Labs:  CBC: Recent Labs    01/02/22 0643 01/02/22 0658 03/13/22 1249 06/19/22 0755  HGB  13.9 13.9 15.3 13.6  HCT 41.0 41.0 45.0 40.0    COAGS: No results for input(s): "INR", "APTT" in the last 8760 hours.  BMP: Recent Labs    01/02/22 0643 01/02/22 0658 03/13/22 1249 06/19/22 0755  NA 137 140 140 141  K 6.4* 4.3 4.3 4.2  CL 107 106 104 107  GLUCOSE 105* 105* 95 103*  BUN 57* 34* 27* 29*  CREATININE 2.10* 2.00* 1.60* 2.00*    LIVER FUNCTION TESTS: No results for input(s): "BILITOT", "AST", "ALT", "ALKPHOS", "PROT", "ALBUMIN" in the last 8760 hours.  TUMOR MARKERS: No results for input(s): "AFPTM", "CEA", "CA199", "CHROMGRNA" in the last 8760 hours.  Assessment and Plan: 71 y.o. male with past medical history of BPH, seizures, dyslipidemia, essential tremor, GERD, meningioma resection, hypertension, migraines, ulcerative colitis, diverticulosis, prostate cancer , renal cysts and nephrolithiasis.  Patient is status post cystoscopy with urethral balloon dilation on 06/19/2022 for renal calculi, bulbar urethral stricture and right hydronephrosis.  The right ureteral orifice could not be identified.  He presents today for right percutaneous nephrostomy with possible nephroureteral catheter placement.Risks and benefits of right PCN placement/possible nephroureteral cath placement was discussed with the patient including, but not limited to, infection, bleeding, significant bleeding causing loss or decrease in renal function or damage to adjacent structures.   All of the patient's questions were answered, patient is agreeable to proceed.  Consent signed and in chart.      Thank you for this interesting consult.  I greatly enjoyed meeting Darshawn Boateng and look forward to participating in their care.  A copy of this report was sent to the requesting provider on this date.  Electronically Signed: D. Rowe Robert, PA-C 07/18/2022, 9:02 AM   I spent a total of   25 minutes  in face to face in clinical consultation, greater than 50% of which was  counseling/coordinating care for right percutaneous nephrostomy/possible nephroureteral catheter placement

## 2022-07-18 NOTE — Procedures (Addendum)
Interventional Radiology Procedure Note  Procedure: Image guided right antegrade nephro-ureteral drain, 70F.  Findings: 28cm nephroureteral drain, capped.  The stone of interest was in the lower pole calyx and was noted to be mobile during the procedure.  Complications: None  Recommendations: - Routine drain care, capped - 1 hr dc home when goals met - drain education, with gravity bag to go home for patient as back up.  Patient can keep capped unless new worsening pain, new fevers/rigors/chills or other concern for infection - follow up with Urology - new stent pain and/or bladder spasm not unexpected  Signed,  Dulcy Fanny. Earleen Newport, DO

## 2022-07-25 DIAGNOSIS — N200 Calculus of kidney: Secondary | ICD-10-CM | POA: Diagnosis not present

## 2022-07-25 DIAGNOSIS — R8271 Bacteriuria: Secondary | ICD-10-CM | POA: Diagnosis not present

## 2022-07-25 DIAGNOSIS — N130 Hydronephrosis with ureteropelvic junction obstruction: Secondary | ICD-10-CM | POA: Diagnosis not present

## 2022-07-26 ENCOUNTER — Other Ambulatory Visit: Payer: Self-pay | Admitting: Urology

## 2022-07-31 NOTE — Progress Notes (Signed)
COVID Vaccine Completed:  Yes  Date of COVID positive in last 90 days:  PCP - Iran Planas, MD Cardiologist - Lamar Blinks, MD Neurologist - Hyman Bower, MD  Chest x-ray -  EKG - 01-02-22 Epic Stress Test - 2019 [facility] ECHO - greater than 2 years [facility] Cardiac Cath -  Pacemaker/ICD device last checked: Spinal Cord Stimulator:  Bowel Prep -   Sleep Study -  CPAP -   Fasting Blood Sugar -  Checks Blood Sugar _____ times a day  Blood Thinner Instructions: Aspirin Instructions: Last Dose:  Activity level:  Can go up a flight of stairs and perform activities of daily living without stopping and without symptoms of chest pain or shortness of breath.  Able to exercise without symptoms  Unable to go up a flight of stairs without symptoms of     Anesthesia review:   Complex partial seizures.  Followed by cardiology for angina  Patient denies shortness of breath, fever, cough and chest pain at PAT appointment  Patient verbalized understanding of instructions that were given to them at the PAT appointment. Patient was also instructed that they will need to review over the PAT instructions again at home before surgery.

## 2022-07-31 NOTE — Patient Instructions (Addendum)
SURGICAL WAITING ROOM VISITATION Patients having surgery or a procedure may have no more than 2 support people in the waiting area - these visitors may rotate.   Children under the age of 42 must have an adult with them who is not the patient. If the patient needs to stay at the hospital during part of their recovery, the visitor guidelines for inpatient rooms apply. Pre-op nurse will coordinate an appropriate time for 1 support person to accompany patient in pre-op.  This support person may not rotate.    Please refer to the [facility] website for the visitor guidelines for Inpatients (after your surgery is over and you are in a regular room).      Your procedure is scheduled on: 08-07-22   Report to [place] Entrance    Report to admitting at 1:15 PM   Call this number if you have problems the morning of surgery [phone]   Do not eat food :After Midnight.   After Midnight you may have the following liquids until AM/ PM DAY OF SURGERY  Water Non-Citrus Juices (without pulp, NO RED) Carbonated Beverages Black Coffee (NO MILK/CREAM OR CREAMERS, sugar ok)  Clear Tea (NO MILK/CREAM OR CREAMERS, sugar ok) regular and decaf                             Plain Jell-O (NO RED)                                           Fruit ices (not with fruit pulp, NO RED)                                     Popsicles (NO RED)                                                               Sports drinks like Gatorade (NO RED)                      If you have questions, please contact your surgeon's office.   FOLLOW  ANY ADDITIONAL PRE OP INSTRUCTIONS YOU RECEIVED FROM YOUR SURGEON'S OFFICE!!!     Oral Hygiene is also important to reduce your risk of infection.                                    Remember - BRUSH YOUR TEETH THE MORNING OF SURGERY WITH YOUR REGULAR TOOTHPASTE   Do NOT smoke after Midnight   Take these medicines the morning of surgery with A SIP OF WATER:    Cipro  Finasteride  Keppra  Oxcarbazepine  Trospium                              You may not have any metal on your body including jewelry, and body piercing             Do  not wear lotions, powders, cologne, or deodorant              Men may shave face and neck.   Do not bring valuables to the hospital. [place].   Contacts, dentures or bridgework may not be worn into surgery.  DO NOT [place]. PHARMACY WILL DISPENSE MEDICATIONS LISTED ON YOUR MEDICATION LIST TO YOU DURING YOUR ADMISSION [place]!    Patients discharged on the day of surgery will not be allowed to drive home.  Someone NEEDS to stay with you for the first 24 hours after anesthesia.   Special Instructions: Bring a copy of your healthcare power of attorney and living will documents the day of surgery if you haven't scanned them before.              Please read over the following fact sheets you were given: IF [place]  If you received a COVID test during your pre-op visit  it is requested that you wear a mask when out in public, stay away from anyone that may not be feeling well and notify your surgeon if you develop symptoms. If you test positive for Covid or have been in contact with anyone that has tested positive in the last 10 days please notify you surgeon.  [place] - Preparing for Surgery Before surgery, you can play an important role.  Because skin is not sterile, your skin needs to be as free of germs as possible.  You can reduce the number of germs on your skin by washing with CHG (chlorahexidine gluconate) soap before surgery.  CHG is an antiseptic cleaner which kills germs and bonds with the skin to continue killing germs even after washing. Please DO NOT use if you have an allergy to CHG or antibacterial soaps.  If your skin becomes reddened/irritated  stop using the CHG and inform your nurse when you arrive at Short Stay. Do not shave (including legs and underarms) for at least 48 hours prior to the first CHG shower.  You may shave your face/neck.  Please follow these instructions carefully:  1.  Shower with CHG Soap the night before surgery and the  morning of surgery.  2.  If you choose to wash your hair, wash your hair first as usual with your normal  shampoo.  3.  After you shampoo, rinse your hair and body thoroughly to remove the shampoo.                             4.  Use CHG as you would any other liquid soap.  You can apply chg directly to the skin and wash.  Gently with a scrungie or clean washcloth.  5.  Apply the CHG Soap to your body ONLY FROM THE NECK DOWN.   Do   not use on face/ open                           Wound or open sores. Avoid contact with eyes, ears mouth and   genitals (private parts).                       Wash face,  Genitals (private parts) with your normal soap.  6.  Wash thoroughly, paying special attention to the area where your    surgery  will be performed.  7.  Thoroughly rinse your body with warm water from the neck down.  8.  DO NOT shower/wash with your normal soap after using and rinsing off the CHG Soap.                9.  Pat yourself dry with a clean towel.            10.  Wear clean pajamas.            11.  Place clean sheets on your bed the night of your first shower and do not  sleep with pets. Day of Surgery : Do not apply any lotions/deodorants the morning of surgery.  Please wear clean clothes to the hospital/surgery center.  FAILURE TO FOLLOW THESE INSTRUCTIONS MAY RESULT IN THE CANCELLATION OF YOUR SURGERY  PATIENT SIGNATURE_________________________________  NURSE SIGNATURE__________________________________  ________________________________________________________________________

## 2022-08-01 NOTE — H&P (Signed)
CC/HPI: cc: Urolithiasis, UTI follow-up   Prostate Cancer - patient's PSA on 04/21/2020 is 0.015. He was treated with brachytherapy and external beam.  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 No further surveillance imaging needed.  ED - patient uses sildenafil which helps. He will let me know when he needs a refill.  BPH - patient using tamsulosin and finasteride without difficulty. His urinary symptoms are stable.  Low testosterone - patient uses testosterone replacement and most recent testosterone levels were in the 500s.  Urethral stricture - s/p dilation  Urolithiasis     07/25/2022: 71 year old man with a complicated past urologic history including prostate cancer treated with brachytherapy, bulbar urethral stricture, overactive bladder, ED and most recently urolithiasis with possible right hydronephrosis and worsening kidney function. Patient recently underwent dilation of bulbar urethral stricture and was found to have obliterated prostatic urethra with extruded brachytherapy seeds and unable to clearly visualize bilateral UOs. In addition patient had worsening kidney function which is slightly improved to a creatinine of 1.6 from 2.0 prior to the procedure. However last year creatinine was approximately 1. He also continues to struggle with overactive bladder and is on trospium. He does have fairly significant leakage. He has been using Myrbetriq over the last few weeks which have helped his symptoms. He also underwent placement of a right nephroureteral stent that has been capped. He is tolerating the stent okay.     ALLERGIES: Sulfa Drugs    MEDICATIONS: Androgel 20.25 mg/1.25 gram per actuation (1.62 %) gel in metered-dose pump Apply 4 pumps daily to inner thigh  Celecoxib 100 mg capsule 1 capsule PO Bedtime  Finasteride 5 mg tablet 1 tablet PO Daily  Myrbetriq 50 mg tablet, extended release 24 hr 1 tablet PO Daily  Sildenafil Citrate 20 mg tablet 1-2 tablet PO PRN   Amlodipine Besylate 5 mg tablet Oral  Cephalexin 250 mg capsule 1 capsule PO Daily  Ciclopirox 0.77 % cream External  Dicyclomine Hcl 20 mg tablet  Doxepin Hcl 50 mg capsule  LevETIRAcetam 500 MG Oral Tablet Oral  Oxtellar Xr 600 mg tablet, extended release 24 hr Oral  Stelara     GU PSH: Cysto Bladder Stone <2.5cm - 08/23/2020, 2020 Cysto Bladder Ureth Biopsy - 2013 Cysto Dilate Stricture (M or F) - 06/19/2022, 03/13/2021, 08/23/2020, 2020 Cysto Remove Stent FB Sim - 04/06/2022 Cystoscopy - 06/06/2022, 12/14/2021, 03/10/2021, 08/11/2020, 2020 Cystoscopy TURBT <2 cm - 08/23/2020 Cystoscopy Ureteroscopy - 2010 Locm 300-'399Mg'$ /Ml Iodine,1Ml - 06/29/2022, 2020, 2020 TRANSPERI NEEDLE PLACE, PROS - 2015 Ureteroscopic laser litho - 03/13/2022       PSH Notes: Inguinal Hernia Repair, Surgery Prostate Transperineal Placement Of Needles, Cystoscopy With Biopsy, Craniotomy Suboccipital Excision Of Meningioma, Cystoscopy With Ureteroscopy Left, Arthroscopy Knee Right   NON-GU PSH: Knee Arthroscopy; Dx - 2009 Remove Brain Lining Lesion - 2011     GU PMH: Hydronephrosis - 06/29/2022, - 06/27/2022 (Stable), - 06/22/2022, - 05/07/2022 Bulbar urethral stricture - 06/27/2022, - 06/22/2022, - 06/06/2022, - 05/07/2022, - 04/18/2022, - 01/16/2022, - 12/14/2021, - 11/28/2021, - 04/20/2021, - 03/28/2021, - 03/16/2021, - 03/10/2021, Patient is voiding well without difficulty. Will repeat PVR next visit., - 2022, - 09/13/2020, - 08/26/2020, Based on cystoscopy today would like to proceed with cystoscopy, balloon dilation of urethral stricture, and laser lithotripsy of stones. I discussed the risks and benefits of the procedure including but not limited to pain, bleeding, recurrence of stricture, damage to surrounding structures including urethra and bladder, need for Foley catheter or  additional procedures after this. Patient understands and wishes to proceed., - 08/11/2020, I discovered a moderate bulbar urethral stricture that would not  allow passage of the flexible cystoscope even with pressure and I did not want to cause him any discomfort in the office since I am going to have to take care of his bladder calculi as well so we discussed performing dilation at the time of his cystolitholapaxy., - 2020 History of prostate cancer - 06/27/2022, - 05/07/2022, - 04/18/2022, - 01/16/2022, - 12/14/2021, - 03/16/2021, Repeat PSA in July 2022, - 2022, - 09/13/2020, He has nothing to suggest recurrence with no abnormality on DRE and a PSA that remains undetectable., - 2021 (Stable), Although he has a history of prostate cancer treated with radiation I told him I was not particularly concerned about the possibility of this being secondary to prostate cancer but rather more likely secondary to the effects of radiation., - 2020 (Stable), His prostate cancer remains under excellent control with no abnormality noted on DRE and PSA that remains undetectable., - 2019 (Stable), His erectile dysfunction is likely multifactorial but most likely secondary to the treatment of his prostate cancer with radiation. Also up with me as previously scheduled for his annual prostate cancer follow-up appointment, - 2019 (Stable), At this point it appears his prostate cancer has been well controlled with a PSA that is currently 0.017. I will continue to see him on a yearly basis., - 2018 (Stable), His prostate remains flat and benign to examination. His PSA remains undetectable. I will continue to see him on a yearly basis for DRE and PSA., - 2017, History of prostate cancer, - 2016 Urge incontinence - 06/27/2022, - 06/06/2022 Urinary Frequency - 06/27/2022, (Stable), - 06/06/2022, He is having some new frequency and his nocturia has increased as well. He is using tamsulosin and also Celebrex which resulted in resolution of his nocturia but what I have recommended is a trial of Myrbetriq 25 mg. I have given him samples of this., - 2018, Increased urinary frequency, - 2014 Renal calculus -  06/22/2022, (Stable), - 06/06/2022, Right, - 05/29/2022, Right, He has nonobstructing right renal calculi. I do not feel that this in any way contributed to his gross hematuria, - 2020 Renal cyst (Stable) - 06/22/2022, Right, No further imaging necessary for simple cyst, - 2022, Right, Has Bosniak 2 F cysts. It was suggested that further characterization may be attained with an MRI scan so I will schedule him for that in 6 months., - 2021, Renal cysts, acquired, bilateral, - 2014 Urinary Urgency (Stable) - 06/06/2022, - 05/07/2022, - 04/20/2021, - 09/13/2020 (Worsening), He uses DDAVP at night but still is having some nocturia and also is having some urgency and therefore I have recommended a trial of Toviaz to see if he notes this helps any better than the Myrbetriq., - 2019 BPH w/LUTS - 05/07/2022, - 01/16/2022, - 12/14/2021 (Stable), Patient to continue tamsulosin and finasteride., - 2022, Benign prostatic hyperplasia with urinary obstruction, - 2015 Calculus of LUT, Unspec - 05/07/2022, - 08/26/2020, - 08/11/2020 Acute Cystitis/UTI - 04/18/2022, - 02/22/2022, - 11/28/2021 Ureteral calculus - 04/18/2022, - 04/06/2022 (Stable), - 03/02/2022, Ureteral Stone, - 2014 Gross hematuria - 03/02/2022, - 01/05/2022, - 09/13/2020, He is experienced gross hematuria and has had previous ionizing radiation and therefore a full evaluation will be undertaken. He has had a history of stones as well so I will obtain a CT scan to evaluate the upper tract and then he will return for completion of his  workup with cystoscopy., - 2020 Bladder Stone - 01/16/2022, - 01/05/2022 (Stable), - 12/14/2021, The source of his gross hematuria is almost certainly the stones located within his bladder on CT scan. I was not able to visualize these due to his stricture., - 2020 Weak Urinary Stream - 04/20/2021, - 03/03/2021 Incomplete bladder emptying - 03/03/2021 Microscopic hematuria, Patient had gross hematuria workup late last year. Will continue to monitor this. -  2022 Nocturia - 2022, - 09/13/2020, Nocturia, - 2016 Primary hypogonadism, This is managed with AndroGel 3 pumps. He will remain on this. - 2021, (Stable), We discussed the fact that his serum testosterone level was found to be above the reference range. He felt that that was the day that he used to of the packets so what I have recommended is he decreased the dosage down to 1 packet daily., - 2019, Hypogonadism, testicular, - 2016 ED due to arterial insufficiency (Worsening), He is going to use sildenafil 20 mg titrating up to 100 mg as needed. - 2019 Unil Inguinal Hernia W/O obst or gang,non-recurrent, Right inguinal hernia - 2015 Elevated PSA, PSA,Elevated - 2014 Peyronies Disease, Peyronie's disease - 2014 Urinary Retention, Unspec, Incomplete bladder emptying - 2014      PMH Notes: Prostate cancer:  Date of diagnosis: 10/29/08  Pretreatment PSA: 5.2  Gleason score: 4+3=7, 5/12 cores positive with only one core positive for Gleason 4+3.  Stage - T1c  Treatment: External Beam Radiation 3-4/10 / Brachytherapy 03/04/09   Nephrolithiasis: In 7/10 he underwent a CT scan because of abdominal complaints and was found to have a 3 mm distal right ureteral calculus but no renal calculi were identified. He subsequently passed right ureteral stone. In 10/10 he underwent left ureteroscopy for what appeared to be a stone overlying the left ureter without progression. The stone was located over the sacral region however at the time of his retrograde pyelogram and ureteroscopy no stone could be identified within the ureter.  CT scan in the 5/20 revealed a right renal calculi with no left renal stones noted.   Nocturia: He had noted nocturia as well as some daytime frequency and had been treated with a 5 alpha reductase inhibitor as well as an alpha-blocker but remains symptomatic. I tried DDAVP however this resulted in worsening of his hesitancy he did not want to remain on this therapy. I had prescribed  anticholinergics in the past but he was reluctant to reinitiate this form of therapy because of his history of ulcerative colitis but once that was under control he elected to undergo a trial of VESIcare at a 10 mg dose which was ineffective. I recommended DDAVP which was started in 5/13.   Hypogonadism: He was found to have hypogonadism with a low free testosterone level of 5.5 in 2/11. Initially I recommended he not undergo testosterone replacement since he had just been diagnosed with prostate cancer. Now that he has shown a significant fall and stabilization of his PSA I therefore initiated testosterone replacement therapy in 5/13.  Current therapy: AndroGel 4 pumps q.a.m.   BPH with outlet obstruction: He developed hematuria with clot retention and eventually voided without difficulty but I placed him on finasteride (5/13) in order to prevent this from occurring in the future.  Current therapy: Tamsulosin and finasteride.   Gross hematuria: He initially experienced gross hematuria in 2/20. A CT scan in 5/20 revealed no abnormality of the upper tract and bladder calculi. Cystoscopy revealed a bulbar urethral stricture.   Bulbar urethral stricture: This  was discovered at the time of cystoscopy and treated with balloon dilatation on 03/13/19.   Bilateral renal cysts: He was found have bilateral renal cysts by CT scan in 5/20. There were simple with a Bosniak 21F cyst on the right.   Bladder calculi: He was found to have bladder calculi in underwent cystolitholapaxy on 03/13/19.     NON-GU PMH: Encounter for general adult medical examination without abnormal findings, Encounter for preventive health examination - 01-15-2014 Personal history of other diseases of the circulatory system, History of hypertension - 15-Jan-2013 Personal history of other endocrine, nutritional and metabolic disease, History of hypercholesterolemia - 01-15-2013 Personal history of other specified conditions, History of heartburn - 01-15-13     FAMILY HISTORY: Death In The Family Father - Father Death In The Family Mother - Mother Family Health Status Number - Runs In Family   SOCIAL HISTORY: Marital Status: Married Preferred Language: English; Ethnicity: Not Hispanic Or Latino; Race: White Current Smoking Status: Patient does not smoke anymore.   Tobacco Use Assessment Completed: Used Tobacco in last 30 days? Does drink.  Drinks 3 caffeinated drinks per day.     Notes: Former smoker, Caffeine Use, Marital History - Currently Married, Alcohol Use, Tobacco Use, Occupation:   REVIEW OF SYSTEMS:    GU Review Male:   Patient denies frequent urination, hard to postpone urination, burning/ pain with urination, get up at night to urinate, leakage of urine, stream starts and stops, trouble starting your stream, have to strain to urinate , erection problems, and penile pain.  Gastrointestinal (Upper):   Patient denies nausea, vomiting, and indigestion/ heartburn.  Gastrointestinal (Lower):   Patient denies diarrhea and constipation.  Constitutional:   Patient denies fever, night sweats, weight loss, and fatigue.  Skin:   Patient denies skin rash/ lesion and itching.  Eyes:   Patient denies blurred vision and double vision.  Ears/ Nose/ Throat:   Patient denies sore throat and sinus problems.  Hematologic/Lymphatic:   Patient denies swollen glands and easy bruising.  Cardiovascular:   Patient denies leg swelling and chest pains.  Respiratory:   Patient denies cough and shortness of breath.  Endocrine:   Patient denies excessive thirst.  Musculoskeletal:   Patient denies back pain and joint pain.  Neurological:   Patient denies headaches and dizziness.  Psychologic:   Patient denies depression and anxiety.   VITAL SIGNS:      07/25/2022 08:35 AM  BP 174/99 mmHg  Pulse 88 /min  Temperature 98.7 F / 37.0 C   GU PHYSICAL EXAMINATION:      Notes: right NUS capped, site c/d/i   MULTI-SYSTEM PHYSICAL EXAMINATION:     Constitutional: Well-nourished. No physical deformities. Normally developed. Good grooming.  Neck: Neck symmetrical, not swollen. Normal tracheal position.  Respiratory: No labored breathing, no use of accessory muscles.   Skin: No paleness, no jaundice, no cyanosis. No lesion, no ulcer, no rash.  Neurologic / Psychiatric: Oriented to time, oriented to place, oriented to person. No depression, no anxiety, no agitation.  Gastrointestinal: No rigidity, non obese abdomen.   Eyes: Normal conjunctivae. Normal eyelids.  Ears, Nose, Mouth, and Throat: Left ear no scars, no lesions, no masses. Right ear no scars, no lesions, no masses. Nose no scars, no lesions, no masses. Normal hearing. Normal lips.  Musculoskeletal: Normal gait and station of head and neck.     Complexity of Data:  Records Review:   Previous Doctor Records  Urine Test Review:   Urinalysis  X-Ray  Review: Interventional Radiology: Reviewed Films. Discussed With Patient.     04/06/22 04/13/21 04/21/20 09/29/19 09/23/18 09/13/17 05/30/17 09/18/16  PSA  Total PSA <0.015 ng/mL <0.015 ng/mL <0.015 ng/mL <0.015 ng/mL <0.015 ng/mL 0.017 ng/mL <0.015 ng/dL < 0.015     04/21/20 09/25/19 09/23/18 05/30/17 04/05/14 03/03/14 08/21/13 08/19/12  Hormones  Testosterone, Total 595.4 ng/dL 705.9 ng/dL 1350.7 ng/dL 543.9 ng/dL 467  273  280  478.95     PROCEDURES:          Urinalysis w/Scope Dipstick Dipstick Cont'd Micro  Color: Yellow Bilirubin: Neg mg/dL WBC/hpf: >60/hpf  Appearance: Slightly Cloudy Ketones: Neg mg/dL RBC/hpf: 20 - 40/hpf  Specific Gravity: 1.020 Blood: 3+ ery/uL Bacteria: Mod (26-50/hpf)  pH: 6.0 Protein: 2+ mg/dL Cystals: NS (Not Seen)  Glucose: Neg mg/dL Urobilinogen: 0.2 mg/dL Casts: NS (Not Seen)    Nitrites: Neg Trichomonas: Not Present    Leukocyte Esterase: 3+ leu/uL Mucous: Not Present      Epithelial Cells: NS (Not Seen)      Yeast: NS (Not Seen)      Sperm: Not Present    ASSESSMENT:      ICD-10  Details  1 GU:   Hydronephrosis - U13.2 Acute, Uncomplicated  2   Renal calculus - N20.0 Chronic, Stable   PLAN:            Medications New Meds: Cipro 250 mg tablet 1 tablet PO Daily   #30  0 Refill(s)  Pharmacy Name:  [id number] Address:  [address] [place] [id number] Phone:  [phone] Fax:  [phone]           Orders Labs CULTURE, URINE          Document Letter(s):  Created for Patient: Clinical Summary         Notes:   1. Right urolithiasis/hydronephrosis: The plan is to take patient to operating room for cystoscopy with ureteroscopy laser lithotripsy and stent exchange on the right-hand side. We will convert nephroureteral stent to indwelling double-J ureteral stent at that time. He will be treated with antibiotics leading up to the procedure. We will send urine for culture today. Risks and benefits of the procedure have been discussed with the patient.   2. Overactive bladder: Continue Myrbetriq

## 2022-08-02 ENCOUNTER — Encounter (HOSPITAL_COMMUNITY)
Admission: RE | Admit: 2022-08-02 | Discharge: 2022-08-02 | Disposition: A | Discharge status: Home / Self Care | Payer: Medicare Other | Source: Ambulatory Visit | Attending: Urology | Admitting: Urology

## 2022-08-02 ENCOUNTER — Encounter (HOSPITAL_COMMUNITY): Payer: Self-pay

## 2022-08-02 ENCOUNTER — Other Ambulatory Visit: Payer: Self-pay

## 2022-08-02 VITALS — BP 158/91 | HR 65 | Temp 98.8°F | Resp 12 | Ht 68.0 in | Wt 142.0 lb

## 2022-08-02 DIAGNOSIS — Z01812 Encounter for preprocedural laboratory examination: Secondary | ICD-10-CM | POA: Insufficient documentation

## 2022-08-02 DIAGNOSIS — I2510 Atherosclerotic heart disease of native coronary artery without angina pectoris: Secondary | ICD-10-CM | POA: Insufficient documentation

## 2022-08-02 HISTORY — DX: Unspecified osteoarthritis, unspecified site: M19.90

## 2022-08-02 HISTORY — DX: Malignant neoplasm of prostate: C61

## 2022-08-02 LAB — BASIC METABOLIC PANEL
Anion gap: 5 (ref 5–15)
BUN: 27 mg/dL — ABNORMAL HIGH (ref 8–23)
CO2: 27 mmol/L (ref 22–32)
Calcium: 9.3 mg/dL (ref 8.9–10.3)
Chloride: 107 mmol/L (ref 98–111)
Creatinine, Ser: 1.76 mg/dL — ABNORMAL HIGH (ref 0.61–1.24)
GFR, Estimated: 40 mL/min — ABNORMAL LOW (ref >60)
Glucose, Bld: 113 mg/dL — ABNORMAL HIGH (ref 70–99)
Potassium: 5 mmol/L (ref 3.5–5.1)
Sodium: 139 mmol/L (ref 135–145)

## 2022-08-07 ENCOUNTER — Other Ambulatory Visit: Payer: Self-pay

## 2022-08-07 ENCOUNTER — Encounter (HOSPITAL_COMMUNITY): Payer: Self-pay | Admitting: Urology

## 2022-08-07 ENCOUNTER — Ambulatory Visit (HOSPITAL_COMMUNITY): Payer: Medicare Other

## 2022-08-07 ENCOUNTER — Ambulatory Visit (HOSPITAL_COMMUNITY)
Admission: RE | Admit: 2022-08-07 | Discharge: 2022-08-07 | Disposition: A | Discharge status: Home / Self Care | Payer: Medicare Other | Attending: Urology | Admitting: Urology

## 2022-08-07 ENCOUNTER — Encounter (HOSPITAL_COMMUNITY)
Admission: RE | Disposition: A | Discharge status: Home / Self Care | Payer: Self-pay | Source: Home / Self Care | Attending: Urology

## 2022-08-07 ENCOUNTER — Ambulatory Visit (HOSPITAL_COMMUNITY): Payer: Medicare Other | Admitting: Physician Assistant

## 2022-08-07 ENCOUNTER — Ambulatory Visit (HOSPITAL_BASED_OUTPATIENT_CLINIC_OR_DEPARTMENT_OTHER): Payer: Medicare Other | Admitting: Anesthesiology

## 2022-08-07 DIAGNOSIS — N281 Cyst of kidney, acquired: Secondary | ICD-10-CM | POA: Diagnosis not present

## 2022-08-07 DIAGNOSIS — N3020 Other chronic cystitis without hematuria: Secondary | ICD-10-CM | POA: Diagnosis not present

## 2022-08-07 DIAGNOSIS — I10 Essential (primary) hypertension: Secondary | ICD-10-CM | POA: Diagnosis not present

## 2022-08-07 DIAGNOSIS — Z8546 Personal history of malignant neoplasm of prostate: Secondary | ICD-10-CM | POA: Insufficient documentation

## 2022-08-07 DIAGNOSIS — N289 Disorder of kidney and ureter, unspecified: Secondary | ICD-10-CM

## 2022-08-07 DIAGNOSIS — Z79899 Other long term (current) drug therapy: Secondary | ICD-10-CM | POA: Diagnosis not present

## 2022-08-07 DIAGNOSIS — N35912 Unspecified bulbous urethral stricture, male: Secondary | ICD-10-CM | POA: Insufficient documentation

## 2022-08-07 DIAGNOSIS — N3281 Overactive bladder: Secondary | ICD-10-CM | POA: Insufficient documentation

## 2022-08-07 DIAGNOSIS — Z87891 Personal history of nicotine dependence: Secondary | ICD-10-CM

## 2022-08-07 DIAGNOSIS — R569 Unspecified convulsions: Secondary | ICD-10-CM | POA: Insufficient documentation

## 2022-08-07 DIAGNOSIS — N135 Crossing vessel and stricture of ureter without hydronephrosis: Secondary | ICD-10-CM | POA: Diagnosis not present

## 2022-08-07 DIAGNOSIS — K219 Gastro-esophageal reflux disease without esophagitis: Secondary | ICD-10-CM | POA: Insufficient documentation

## 2022-08-07 DIAGNOSIS — N132 Hydronephrosis with renal and ureteral calculous obstruction: Secondary | ICD-10-CM | POA: Insufficient documentation

## 2022-08-07 DIAGNOSIS — N200 Calculus of kidney: Secondary | ICD-10-CM | POA: Diagnosis not present

## 2022-08-07 DIAGNOSIS — Z923 Personal history of irradiation: Secondary | ICD-10-CM | POA: Insufficient documentation

## 2022-08-07 DIAGNOSIS — Z8711 Personal history of peptic ulcer disease: Secondary | ICD-10-CM | POA: Diagnosis not present

## 2022-08-07 DIAGNOSIS — N400 Enlarged prostate without lower urinary tract symptoms: Secondary | ICD-10-CM | POA: Insufficient documentation

## 2022-08-07 DIAGNOSIS — Z87442 Personal history of urinary calculi: Secondary | ICD-10-CM | POA: Insufficient documentation

## 2022-08-07 HISTORY — PX: HOLMIUM LASER APPLICATION: SHX5852

## 2022-08-07 HISTORY — PX: CYSTOSCOPY WITH RETROGRADE PYELOGRAM, URETEROSCOPY AND STENT PLACEMENT: SHX5789

## 2022-08-07 SURGERY — CYSTOURETEROSCOPY, WITH RETROGRADE PYELOGRAM AND STENT INSERTION
Anesthesia: General | Site: Ureter | Laterality: Right

## 2022-08-07 MED ORDER — FENTANYL CITRATE (PF) 100 MCG/2ML IJ SOLN
INTRAMUSCULAR | Status: DC | PRN
  Administered 2022-08-07: 50 ug via INTRAVENOUS
  Administered 2022-08-07: 25 ug via INTRAVENOUS

## 2022-08-07 MED ORDER — OXYCODONE HCL 5 MG PO TABS
5.0000 mg | ORAL_TABLET | ORAL | Status: DC | PRN
  Administered 2022-08-07: 5 mg via ORAL

## 2022-08-07 MED ORDER — PROPOFOL 10 MG/ML IV BOLUS
INTRAVENOUS | Status: AC
  Filled 2022-08-07: qty 20

## 2022-08-07 MED ORDER — LIDOCAINE HCL (PF) 2 % IJ SOLN
INTRAMUSCULAR | Status: AC
  Filled 2022-08-07: qty 5

## 2022-08-07 MED ORDER — ONDANSETRON HCL 4 MG/2ML IJ SOLN
INTRAMUSCULAR | Status: DC | PRN
  Administered 2022-08-07: 4 mg via INTRAVENOUS

## 2022-08-07 MED ORDER — EPHEDRINE SULFATE-NACL 50-0.9 MG/10ML-% IV SOSY
PREFILLED_SYRINGE | INTRAVENOUS | Status: DC | PRN
  Administered 2022-08-07 (×4): 5 mg via INTRAVENOUS

## 2022-08-07 MED ORDER — ORAL CARE MOUTH RINSE: 15.0000 mL | Freq: Once | OROMUCOSAL | Status: AC

## 2022-08-07 MED ORDER — ONDANSETRON HCL 4 MG/2ML IJ SOLN: 4.0000 mg | Freq: Once | INTRAMUSCULAR | Status: DC | PRN

## 2022-08-07 MED ORDER — SODIUM CHLORIDE 0.9 % IR SOLN
Status: DC | PRN
  Administered 2022-08-07: 6000 mL

## 2022-08-07 MED ORDER — DEXAMETHASONE SODIUM PHOSPHATE 10 MG/ML IJ SOLN
INTRAMUSCULAR | Status: DC | PRN
  Administered 2022-08-07: 10 mg via INTRAVENOUS

## 2022-08-07 MED ORDER — PROPOFOL 10 MG/ML IV BOLUS
INTRAVENOUS | Status: DC | PRN
  Administered 2022-08-07: 150 mg via INTRAVENOUS

## 2022-08-07 MED ORDER — EPHEDRINE 5 MG/ML INJ
INTRAVENOUS | Status: AC
  Filled 2022-08-07: qty 5

## 2022-08-07 MED ORDER — PHENYLEPHRINE 80 MCG/ML (10ML) SYRINGE FOR IV PUSH (FOR BLOOD PRESSURE SUPPORT)
PREFILLED_SYRINGE | INTRAVENOUS | Status: AC
  Filled 2022-08-07: qty 10

## 2022-08-07 MED ORDER — LIDOCAINE 2% (20 MG/ML) 5 ML SYRINGE
INTRAMUSCULAR | Status: DC | PRN
  Administered 2022-08-07: 80 mg via INTRAVENOUS

## 2022-08-07 MED ORDER — FENTANYL CITRATE PF 50 MCG/ML IJ SOSY: 25.0000 ug | PREFILLED_SYRINGE | INTRAMUSCULAR | Status: DC | PRN

## 2022-08-07 MED ORDER — DEXAMETHASONE SODIUM PHOSPHATE 10 MG/ML IJ SOLN
INTRAMUSCULAR | Status: AC
  Filled 2022-08-07: qty 1

## 2022-08-07 MED ORDER — ACETAMINOPHEN 500 MG PO TABS
1000.0000 mg | ORAL_TABLET | Freq: Once | ORAL | Status: AC
  Administered 2022-08-07: 1000 mg via ORAL
  Filled 2022-08-07: qty 2

## 2022-08-07 MED ORDER — CEFAZOLIN SODIUM-DEXTROSE 2-4 GM/100ML-% IV SOLN
2.0000 g | INTRAVENOUS | Status: AC
  Administered 2022-08-07: 2 g via INTRAVENOUS
  Filled 2022-08-07: qty 100

## 2022-08-07 MED ORDER — ONDANSETRON HCL 4 MG/2ML IJ SOLN
INTRAMUSCULAR | Status: AC
  Filled 2022-08-07: qty 2

## 2022-08-07 MED ORDER — FENTANYL CITRATE (PF) 100 MCG/2ML IJ SOLN
INTRAMUSCULAR | Status: AC
  Filled 2022-08-07: qty 2

## 2022-08-07 MED ORDER — 0.9 % SODIUM CHLORIDE (POUR BTL) OPTIME
TOPICAL | Status: DC | PRN
  Administered 2022-08-07: 1000 mL

## 2022-08-07 MED ORDER — CHLORHEXIDINE GLUCONATE 0.12 % MT SOLN
15.0000 mL | Freq: Once | OROMUCOSAL | Status: AC
  Administered 2022-08-07: 15 mL via OROMUCOSAL

## 2022-08-07 MED ORDER — LACTATED RINGERS IV SOLN: INTRAVENOUS | Status: DC

## 2022-08-07 MED ORDER — PHENYLEPHRINE 80 MCG/ML (10ML) SYRINGE FOR IV PUSH (FOR BLOOD PRESSURE SUPPORT)
PREFILLED_SYRINGE | INTRAVENOUS | Status: DC | PRN
  Administered 2022-08-07 (×2): 80 ug via INTRAVENOUS

## 2022-08-07 MED ORDER — OXYCODONE HCL 5 MG PO TABS
ORAL_TABLET | ORAL | Status: AC
  Filled 2022-08-07: qty 1

## 2022-08-07 SURGICAL SUPPLY — 24 items
BAG URO CATCHER STRL LF (MISCELLANEOUS) ×1 IMPLANT
BASKET ZERO TIP NITINOL 2.4FR (BASKET) IMPLANT
CATH URETL OPEN 5X70 (CATHETERS) ×1 IMPLANT
CLOTH BEACON ORANGE TIMEOUT ST (SAFETY) ×1 IMPLANT
DRSG TEGADERM 2-3/8X2-3/4 SM (GAUZE/BANDAGES/DRESSINGS) IMPLANT
DRSG TEGADERM 8X12 (GAUZE/BANDAGES/DRESSINGS) IMPLANT
EXTRACTOR STONE 1.7FRX115CM (UROLOGICAL SUPPLIES) IMPLANT
GAUZE PAD ABD 8X10 STRL (GAUZE/BANDAGES/DRESSINGS) IMPLANT
GAUZE SPONGE 4X4 12PLY STRL (GAUZE/BANDAGES/DRESSINGS) IMPLANT
GLOVE BIO SURGEON STRL SZ 6.5 (GLOVE) ×1 IMPLANT
GOWN STRL REUS W/ TWL LRG LVL3 (GOWN DISPOSABLE) ×1 IMPLANT
GOWN STRL REUS W/TWL LRG LVL3 (GOWN DISPOSABLE) ×3
GUIDEWIRE STR DUAL SENSOR (WIRE) ×1 IMPLANT
KIT TURNOVER KIT A (KITS) IMPLANT
LASER FIB FLEXIVA PULSE ID 365 (Laser) IMPLANT
MANIFOLD NEPTUNE II (INSTRUMENTS) ×1 IMPLANT
PACK CYSTO (CUSTOM PROCEDURE TRAY) ×1 IMPLANT
SHEATH NAVIGATOR HD 11/13X28 (SHEATH) IMPLANT
SHEATH NAVIGATOR HD 11/13X36 (SHEATH) IMPLANT
STENT URET 6FRX26 CONTOUR (STENTS) IMPLANT
TRACTIP FLEXIVA PULS ID 200XHI (Laser) IMPLANT
TRACTIP FLEXIVA PULSE ID 200 (Laser)
TUBING CONNECTING 10 (TUBING) ×1 IMPLANT
TUBING UROLOGY SET (TUBING) ×1 IMPLANT

## 2022-08-07 NOTE — Transfer of Care (Signed)
Immediate Anesthesia Transfer of Care Note  Patient: Wesley [name]  Procedure(s) Performed: CYSTOSCOPY WITH RETROGRADE PYELOGRAM, URETEROSCOPY AND STENT PLACEMENT, REMOVAL OF NEPHROURETERAL STENT (Right: Ureter) HOLMIUM LASER APPLICATION (Right: Ureter)  Patient Location: PACU  Anesthesia Type:General  Level of Consciousness: awake, alert  and oriented  Airway & Oxygen Therapy: Patient Spontanous Breathing and Patient connected to face mask oxygen  Post-op Assessment: Report given to RN and Post -op Vital signs reviewed and stable  Post vital signs: Reviewed and stable  Last Vitals:  Vitals Value Taken Time  BP 136/71 08/07/22 1542  Temp 36.8 C 08/07/22 1542  Pulse 81 08/07/22 1544  Resp 12 08/07/22 1544  SpO2 99 % 08/07/22 1544  Vitals shown include unvalidated device data.  Last Pain:  Vitals:   08/07/22 1253  PainSc: 0-No pain         Complications: No notable events documented.

## 2022-08-07 NOTE — Anesthesia Procedure Notes (Signed)
Procedure Name: LMA Insertion Date/Time: 08/07/2022 2:12 PM  Performed by: Howard Patton D, CRNAPre-anesthesia Checklist: Patient identified, Emergency Drugs available, Suction available and Patient being monitored Patient Re-evaluated:Patient Re-evaluated prior to induction Oxygen Delivery Method: Circle system utilized Preoxygenation: Pre-oxygenation with 100% oxygen Induction Type: IV induction Ventilation: Mask ventilation without difficulty LMA: LMA inserted LMA Size: 4.0 Tube type: Oral Number of attempts: 1 Placement Confirmation: positive ETCO2 and breath sounds checked- equal and bilateral Tube secured with: Tape Dental Injury: Teeth and Oropharynx as per pre-operative assessment

## 2022-08-07 NOTE — Op Note (Signed)
Operative Note  Preoperative diagnosis:  1.  Right renal calculus 2.  Chronic cystitis 3.  Right ureteral orifice stenosis  Postoperative diagnosis: 1.  Right renal calculus 2.  Chronic cystitis 3.  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  Cystoscopy, right ureteroscopy, laser lithotripsy, basket stone extraction 2.  Removal of right nephroureteral stent 3.  Placement of right internal JJ ureteral stent  Surgeon: Jacalyn Lefevre, MD  Assistants:  None  Anesthesia:  General  Complications:  None  EBL:  minimal  Specimens: 1. none  Drains/Catheters: 1.  6Fr x 26cm right JJ stent - no tether  Intraoperative findings:   Normal anterior urethra Prostatic urethral fixed, white/shaggy mucosa Right UO at bladder neck with pale mucosa consistent with radiation changes surrounding and NUS stent emanating from UO Right lower pole calculus adherent to mucosa  Indication:  Quindell Shere is a 71 y.o. male with complicated history of bulbar urethral stricture, prostate cancer treated with brachytherapy, right renal calculi and hydronephrosis.  Patient's right ureteral orifice was not visualized at last procedure and he underwent a right nephroureteral stent placement.  He now returns for diagnostic cystoscopy, right ureteroscopy and attempt to remove the stone as well as internalized the nephroureteral stent.  Description of procedure:  After risks and benefits of the procedure were discussed with the patient, informed consent was obtained.  Patient was taken to the operating room placed in the supine position.  Anesthesia was induced and antibiotics were administered.  Patient was then repositioned in the dorsolithotomy position.  He was prepped and draped in the usual sterile fashion and timeout was performed.  A 21 French rigid cystoscope was placed through the urethral meatus and advanced into the bladder under direct visualization.   Findings noted above.  The prostatic urethra was noted to be fixed with fibrinous/necrotic tissue consistent with prior radiation.  The right nephroureteral stent was seen with a distal curl in the bladder.  A 0.38 sensor wire was advanced alongside the stent to the kidney with fluoroscopic guidance.  While the wire was in place the nephroureteral stent was then removed under fluoroscopic guidance.  The stent was examined and deemed to be intact.  Attention returned to the bladder and a second wire was placed alongside the first wire in similar manner.  The cystoscope was removed.  A ureteral access sheath was placed over one of the wires and advanced to the proximal ureter with fluoroscopic guidance.  The inner sheath and wire removed.  Next flexible ureteroscopy took place.  The known stone from CT was visualized in the lower pole.  This angle was extremely difficult.  A 200 m holmium laser fiber was then used to fragment the stone.  Approximately two thirds of the stone was removed however part of the stone was adherent to the mucosa and due to the angle of the lower pole and degree of flexion of the scope it was unable to be completely removed.  The larger stone fragments were then basket extracted with a 0 tip basket.  Reinspection of the kidney and ureter revealed no remaining or obstructing stone fragments.  The ureteroscope was removed and unison with the ureteral access sheath.  No trauma was noted to the ureter on the way out.  The ureter does appear to be involved with postradiation changes at the UVJ.  A 6 French by 26 cm JJ ureteral stent was placed in standard fashion.  A curl was seen under fluoroscopy  in the kidney and distal curl seen in the bladder under direct visualization.  The patient's bladder was decompressed and the cystoscope was removed.  The patient emerged from anesthesia and transferred back in stable condition.  Plan:  Discharge home with indwelling stent.

## 2022-08-07 NOTE — Anesthesia Preprocedure Evaluation (Addendum)
Anesthesia Evaluation  Patient identified by MRN, date of birth, ID band Patient awake    Reviewed: Allergy & Precautions, NPO status , Patient's Chart, lab work & pertinent test results  Airway Mallampati: II  TM Distance: >3 FB Neck ROM: Full    Dental  (+) Teeth Intact, Dental Advisory Given   Pulmonary former smoker,    Pulmonary exam normal breath sounds clear to auscultation       Cardiovascular hypertension, Pt. on medications (-) angina(-) Past MI Normal cardiovascular exam Rhythm:Regular Rate:Normal     Neuro/Psych  Headaches, Seizures - (last seizure 2 months ago),  negative psych ROS   GI/Hepatic Neg liver ROS, hiatal hernia (s/p lap paraesophageal hernia repair w/ nissen fundoplication), PUD, GERD  ,UC   Endo/Other  negative endocrine ROS  Renal/GU Renal InsufficiencyRenal disease (HYDRONEPHROSIS, RENAL CALCULUS) Bladder dysfunction      Musculoskeletal  (+) Arthritis ,   Abdominal   Peds  Hematology negative hematology ROS (+)   Anesthesia Other Findings Day of surgery medications reviewed with the patient.  Reproductive/Obstetrics                            Anesthesia Physical Anesthesia Plan  ASA: 3  Anesthesia Plan: General   Post-op Pain Management: Tylenol PO (pre-op)*   Induction: Intravenous  PONV Risk Score and Plan: 3 and Dexamethasone and Ondansetron  Airway Management Planned: LMA  Additional Equipment:   Intra-op Plan:   Post-operative Plan: Extubation in OR  Informed Consent: I have reviewed the patients History and Physical, chart, labs and discussed the procedure including the risks, benefits and alternatives for the proposed anesthesia with the patient or authorized representative who has indicated his/her understanding and acceptance.     Dental advisory given  Plan Discussed with: CRNA  Anesthesia Plan Comments:        Anesthesia  Quick Evaluation

## 2022-08-07 NOTE — Interval H&P Note (Signed)
History and Physical Interval Note:  08/07/2022 1:58 PM  Wesley [name]  has presented today for surgery, with the diagnosis of HYDRONEPHROSIS, RENAL CALCULUS.  The various methods of treatment have been discussed with the patient and family. After consideration of risks, benefits and other options for treatment, the patient has consented to  Procedure(s) with comments: CYSTOSCOPY WITH RETROGRADE PYELOGRAM, URETEROSCOPY AND STENT PLACEMENT (Right) - 50 MINS HOLMIUM LASER APPLICATION (Right) as a surgical intervention.  The patient's history has been reviewed, patient examined, no change in status, stable for surgery.  I have reviewed the patient's chart and labs.  Questions were answered to the patient's satisfaction.     Dessire Grimes D Holt Woolbright

## 2022-08-07 NOTE — Discharge Instructions (Addendum)
DISCHARGE INSTRUCTIONS FOR KIDNEY STONE/URETERAL STENT   MEDICATIONS:  1. Resume all your other meds from home  2. AZO over the counter can help with the burning/stinging when you urinate. 3. Tramadol is for moderate/severe pain, otherwise taking up to 1000 mg every 6 hours of plain Tylenol will help treat your pain.      ACTIVITY:  1. No strenuous activity x 1week  2. No driving while on narcotic pain medications  3. Drink plenty of water  4. Continue to walk at home - you can still get blood clots when you are at home, so keep active, but don't over do it.  5. May return to work/school tomorrow or when you feel ready   BATHING:  1. You can shower and we recommend daily showers. 2.  The prior area on your back with external stent was will seal in 48 to 72 hours.  This dressing can be replaced as needed until then.  It is normal to have leakage from this area until it is healed.   SIGNS/SYMPTOMS TO CALL:  Please call us if you have a fever greater than 101.5, uncontrolled nausea/vomiting, uncontrolled pain, dizziness, unable to urinate, bloody urine, chest pain, shortness of breath, leg swelling, leg pain, redness around wound, drainage from wound, or any other concerns or questions.   You can reach [place] at [phone].   FOLLOW-UP:  1. We will discuss removal of ureteral stent at your follow up appointment.

## 2022-08-08 ENCOUNTER — Encounter (HOSPITAL_COMMUNITY): Payer: Self-pay | Admitting: Urology

## 2022-08-08 NOTE — Anesthesia Postprocedure Evaluation (Signed)
Anesthesia Post Note  Patient: Wesley [name]  Procedure(s) Performed: CYSTOSCOPY WITH RETROGRADE PYELOGRAM, URETEROSCOPY AND STENT PLACEMENT, REMOVAL OF NEPHROURETERAL STENT (Right: Ureter) HOLMIUM LASER APPLICATION (Right: Ureter)     Patient location during evaluation: PACU Anesthesia Type: General Level of consciousness: awake and alert Pain management: pain level controlled Vital Signs Assessment: post-procedure vital signs reviewed and stable Respiratory status: spontaneous breathing, nonlabored ventilation, respiratory function stable and patient connected to nasal cannula oxygen Cardiovascular status: blood pressure returned to baseline and stable Postop Assessment: no apparent nausea or vomiting Anesthetic complications: no   No notable events documented.  Last Vitals:  Vitals:   08/07/22 1700 08/07/22 1730  BP: (!) 152/74 (!) 151/74  Pulse: 86 85  Resp: 20 20  Temp: 36.6 C   SpO2: 92% 94%    Last Pain:  Vitals:   08/07/22 1730  TempSrc:   PainSc: 0-No pain                 Effie Berkshire

## 2022-08-14 ENCOUNTER — Emergency Department (HOSPITAL_COMMUNITY)
Admission: EM | Admit: 2022-08-14 | Discharge: 2022-08-14 | Disposition: A | Discharge status: Home / Self Care | Payer: Medicare Other | Attending: Emergency Medicine | Admitting: Emergency Medicine

## 2022-08-14 ENCOUNTER — Encounter (HOSPITAL_COMMUNITY): Payer: Self-pay

## 2022-08-14 ENCOUNTER — Emergency Department (HOSPITAL_COMMUNITY): Payer: Medicare Other

## 2022-08-14 ENCOUNTER — Other Ambulatory Visit: Payer: Self-pay

## 2022-08-14 DIAGNOSIS — R079 Chest pain, unspecified: Secondary | ICD-10-CM | POA: Diagnosis not present

## 2022-08-14 DIAGNOSIS — J181 Lobar pneumonia, unspecified organism: Secondary | ICD-10-CM | POA: Insufficient documentation

## 2022-08-14 DIAGNOSIS — R1011 Right upper quadrant pain: Secondary | ICD-10-CM | POA: Diagnosis not present

## 2022-08-14 DIAGNOSIS — J90 Pleural effusion, not elsewhere classified: Secondary | ICD-10-CM | POA: Insufficient documentation

## 2022-08-14 DIAGNOSIS — R109 Unspecified abdominal pain: Secondary | ICD-10-CM | POA: Diagnosis not present

## 2022-08-14 DIAGNOSIS — R0602 Shortness of breath: Secondary | ICD-10-CM | POA: Diagnosis present

## 2022-08-14 DIAGNOSIS — I700 Atherosclerosis of aorta: Secondary | ICD-10-CM | POA: Diagnosis not present

## 2022-08-14 DIAGNOSIS — N281 Cyst of kidney, acquired: Secondary | ICD-10-CM | POA: Diagnosis not present

## 2022-08-14 DIAGNOSIS — J189 Pneumonia, unspecified organism: Secondary | ICD-10-CM | POA: Diagnosis not present

## 2022-08-14 LAB — BASIC METABOLIC PANEL
Anion gap: 16 — ABNORMAL HIGH (ref 5–15)
BUN: 23 mg/dL (ref 8–23)
CO2: 25 mmol/L (ref 22–32)
Calcium: 9.8 mg/dL (ref 8.9–10.3)
Chloride: 100 mmol/L (ref 98–111)
Creatinine, Ser: 1.71 mg/dL — ABNORMAL HIGH (ref 0.61–1.24)
GFR, Estimated: 42 mL/min — ABNORMAL LOW (ref >60)
Glucose, Bld: 104 mg/dL — ABNORMAL HIGH (ref 70–99)
Potassium: 4.1 mmol/L (ref 3.5–5.1)
Sodium: 141 mmol/L (ref 135–145)

## 2022-08-14 LAB — CBC WITH DIFFERENTIAL/PLATELET
Abs Immature Granulocytes: 0.03 10*3/uL (ref 0.00–0.07)
Basophils Absolute: 0.1 10*3/uL (ref 0.0–0.1)
Basophils Relative: 1 %
Eosinophils Absolute: 0.3 10*3/uL (ref 0.0–0.5)
Eosinophils Relative: 5 %
HCT: 44.8 % (ref 39.0–52.0)
Hemoglobin: 14.5 g/dL (ref 13.0–17.0)
Immature Granulocytes: 1 %
Lymphocytes Relative: 18 %
Lymphs Abs: 1.2 10*3/uL (ref 0.7–4.0)
MCH: 31 pg (ref 26.0–34.0)
MCHC: 32.4 g/dL (ref 30.0–36.0)
MCV: 95.9 fL (ref 80.0–100.0)
Monocytes Absolute: 0.7 10*3/uL (ref 0.1–1.0)
Monocytes Relative: 10 %
Neutro Abs: 4.3 10*3/uL (ref 1.7–7.7)
Neutrophils Relative %: 65 %
Platelets: 246 10*3/uL (ref 150–400)
RBC: 4.67 MIL/uL (ref 4.22–5.81)
RDW: 12.9 % (ref 11.5–15.5)
WBC: 6.6 10*3/uL (ref 4.0–10.5)
nRBC: 0 % (ref 0.0–0.2)

## 2022-08-14 MED ORDER — LEVOFLOXACIN 500 MG PO TABS
500.0000 mg | ORAL_TABLET | Freq: Once | ORAL | Status: AC
  Administered 2022-08-14: 500 mg via ORAL
  Filled 2022-08-14: qty 1

## 2022-08-14 MED ORDER — IOHEXOL 350 MG/ML SOLN
60.0000 mL | Freq: Once | INTRAVENOUS | Status: AC | PRN
  Administered 2022-08-14: 60 mL via INTRAVENOUS

## 2022-08-14 MED ORDER — LEVOFLOXACIN 500 MG PO TABS: 500.0000 mg | ORAL_TABLET | Freq: Every day | ORAL | 0 refills | Status: DC

## 2022-08-14 NOTE — ED Notes (Signed)
Pt ambulated from ED with steady gait. Pt dressed for discharge. Pt has access to home.

## 2022-08-14 NOTE — Discharge Instructions (Addendum)
Please stop taking Cipro and I switched you to Levaquin 500 mg daily for a week  I have referred you to pulmonary clinic for further evaluation of your pleural effusion.  You should call the office tomorrow to schedule an appointment.  Please keep your appointment with Dr. Claudia Desanctis tomorrow  Return to ER if you have worse shortness of breath, chest pain, fever

## 2022-08-14 NOTE — ED Triage Notes (Signed)
Pt to er, pt states that he was just seen at his urologists, states that last week he was here for some flank pain, states that he had a ct and it showed some pleural fluid, states that they sent him over to have another ct to get better detail.

## 2022-08-14 NOTE — Progress Notes (Signed)
Pt seen at [facility] with RUQ pain and difficulty taking deep breath.  CT Abd/Pelvis did not show perinephric process but he does have right atelectasis and right pleural effusion.  Send to ED to rule out pulmonary/cardiac process.  ED physician notified pt would be coming.

## 2022-08-14 NOTE — ED Provider Notes (Signed)
[facility] DEPT Provider Note   CSN: [id number] Arrival date & time: 08/14/22  1114     History  Chief Complaint  Patient presents with   Flank Pain    Wesley [name] is a 71 y.o. male history of recurrent renal calculus status post lithotripsy and ureteral stent placement a week ago, here presenting with right flank pain and possible pneumonia. Patient had the procedure done by Dr. Claudia Desanctis on 10/31.  Patient is on Cipro to 50 mg daily.  Patient has persistent right flank pain.  Patient states that he went to the clinic today and had a CT noncontrast that showed that the stents were in place but there is pleural effusion that is new.  Patient has some shortness of breath and was sent here for rule out PE versus pneumonia.  Patient denies any chest pain to me.  Denies any fever or cough  The history is provided by the patient.       Home Medications Prior to Admission medications   Medication Sig Start Date End Date Taking? Authorizing Provider  amLODipine (NORVASC) 5 MG tablet Take 5 mg by mouth at bedtime.    [provider]  celecoxib (CELEBREX) 100 MG capsule Take 100 mg by mouth at bedtime. 12/23/20   [provider]  Cholecalciferol (VITAMIN D-3) 125 MCG (5000 UT) TABS Take 5,000 Units by mouth daily at 12 noon.    [provider]  ciclopirox (LOPROX) 0.77 % cream Apply 1 application topically 2 (two) times daily as needed (dry/flaky skin).    [provider]  Ciclopirox 1 % shampoo Apply 1 application topically daily as needed (dry/flaky skin).    [provider]  ciprofloxacin (CIPRO) 250 MG tablet Take 250 mg by mouth daily.    [provider]  doxepin (SINEQUAN) 25 MG capsule Take 25 mg by mouth at bedtime.    [provider]  finasteride (PROSCAR) 5 MG tablet Take 5 mg by mouth daily.    [provider]  levETIRAcetam (KEPPRA) 500 MG tablet Take 500 mg by mouth 3  (three) times daily.    [provider]  losartan (COZAAR) 50 MG tablet Take 50 mg by mouth daily.    [provider]  MYRBETRIQ 50 MG TB24 tablet Take 50 mg by mouth every evening. 07/23/22   [provider]  nitroGLYCERIN (NITROSTAT) 0.4 MG SL tablet Place 0.4 mg under the tongue every 5 (five) minutes x 3 doses as needed for chest pain.    [provider]  OXcarbazepine ER (OXTELLAR XR) 600 MG TB24 Take 600 mg by mouth daily in the afternoon.    [provider]  primidone (MYSOLINE) 50 MG tablet Take 50 mg by mouth at bedtime.    [provider]  tamsulosin (FLOMAX) 0.4 MG CAPS capsule Take 1 capsule (0.4 mg total) by mouth daily. Patient taking differently: Take 0.4 mg by mouth at bedtime. 03/13/22   Robley Fries, MD  Testosterone 20.25 MG/ACT (1.62%) GEL Place 3 Pump onto the skin every evening.    [provider]  traMADol (ULTRAM) 50 MG tablet Take 1 tablet (50 mg total) by mouth every 6 (six) hours as needed. Patient not taking: Reported on 07/30/2022 03/13/22 03/13/23  Jacalyn Lefevre D, MD  Trospium Chloride 60 MG CP24 Take 1 capsule by mouth daily.    [provider]  ustekinumab (STELARA) 90 MG/ML SOSY injection Inject 90 mg into the skin every 8 (  eight) weeks.    [provider]  vitamin B-12 (CYANOCOBALAMIN) 1000 MCG tablet Take 1,000 mcg by mouth daily.    [provider]      Allergies    Sulfa antibiotics    Review of Systems   Review of Systems  Genitourinary:  Positive for flank pain.  All other systems reviewed and are negative.   Physical Exam Updated Vital Signs BP (!) 179/117 (BP Location: Left Arm)   Pulse (!) 101   Temp 98.9 F (37.2 C) (Oral)   Resp 16   Ht '5\' 8"'$  (1.727 m)   Wt 63.5 kg   SpO2 96%   BMI 21.29 kg/m  Physical Exam Vitals and nursing note reviewed.  HENT:     Head: Normocephalic.     Nose: Nose normal.     Mouth/Throat:     Mouth: Mucous membranes  are moist.  Eyes:     Extraocular Movements: Extraocular movements intact.     Pupils: Pupils are equal, round, and reactive to light.  Cardiovascular:     Rate and Rhythm: Normal rate and regular rhythm.     Pulses: Normal pulses.     Heart sounds: Normal heart sounds.  Pulmonary:     Comments: Crackles on right base Abdominal:     General: Abdomen is flat.     Palpations: Abdomen is soft.     Comments: Mild right CVA tenderness  Musculoskeletal:        General: Normal range of motion.     Cervical back: Normal range of motion and neck supple.  Skin:    General: Skin is warm.     Capillary Refill: Capillary refill takes less than 2 seconds.  Neurological:     General: No focal deficit present.     Mental Status: He is alert and oriented to person, place, and time.  Psychiatric:        Mood and Affect: Mood normal.        Behavior: Behavior normal.     ED Results / Procedures / Treatments   Labs (all labs ordered are listed, but only abnormal results are displayed) Labs Reviewed  BASIC METABOLIC PANEL - Abnormal; Notable for the following components:      Result Value   Glucose, Bld 104 (*)    Creatinine, Ser 1.71 (*)    GFR, Estimated 42 (*)    Anion gap 16 (*)    All other components within normal limits  CBC WITH DIFFERENTIAL/PLATELET    EKG None  Radiology CT Angio Chest PE W and/or Wo Contrast  Result Date: 08/14/2022 CLINICAL DATA:  Fluid in the lungs abnormal CT flank pain EXAM: CT ANGIOGRAPHY CHEST WITH CONTRAST TECHNIQUE: Multidetector CT imaging of the chest was performed using the standard protocol during bolus administration of intravenous contrast. Multiplanar CT image reconstructions and MIPs were obtained to evaluate the vascular anatomy. RADIATION DOSE REDUCTION: This exam was performed according to the departmental dose-optimization program which includes automated exposure control, adjustment of the mA and/or kV according to patient size and/or use  of iterative reconstruction technique. CONTRAST:  8m OMNIPAQUE IOHEXOL 350 MG/ML SOLN COMPARISON:  CT 08/14/2022 FINDINGS: Cardiovascular: Satisfactory opacification of the pulmonary arteries to the segmental level. No evidence of pulmonary embolism. Mild aortic atherosclerosis. No aneurysm or dissection. Coronary vascular calcification. Normal cardiac size. No pericardial effusion Mediastinum/Nodes: Midline trachea. No thyroid mass. No suspicious lymph nodes. Esophagus within normal limits. Lungs/Pleura: Trace left pleural effusion. Small to moderate loculated  right pleural effusion. Partial consolidation in the right lower lobe. Loculated fluid along the right pulmonary fissure. Upper Abdomen: Incompletely visualized right ureteral stent. Incompletely visualized renal cysts, no follow-up imaging is recommended Musculoskeletal: No chest wall abnormality. No acute or significant osseous findings. Review of the MIP images confirms the above findings. IMPRESSION: 1. Negative for acute pulmonary embolus or aortic dissection. 2. Small to moderate loculated right pleural effusion with partial consolidation in the right lower lobe, atelectasis versus pneumonia. Trace left pleural effusion. 3. Aortic atherosclerosis. Aortic Atherosclerosis (ICD10-I70.0). Electronically Signed   By: Donavan Foil M.D.   On: 08/14/2022 17:16    Procedures Procedures    Medications Ordered in ED Medications  levofloxacin (LEVAQUIN) tablet 500 mg (has no administration in time range)  iohexol (OMNIPAQUE) 350 MG/ML injection 60 mL (60 mLs Intravenous Contrast Given 08/14/22 1648)    ED Course/ Medical Decision Making/ A&P                           Medical Decision Making Briston Lax is a 71 y.o. male here presenting with right flank pain and shortness of breath.  Patient has a noncontrast CT scan of the abdomen that showed possible pleural effusion and was sent here for CT chest.  I reviewed his CT chest which was  done in triage that showed small to moderate loculated right pleural effusion with consolidation.  Patient is afebrile white blood cell count is normal.  Discussed case with the pulmonary doctor, Dr. Patsey Berthold.  She reviewed the labs and imaging studies.  She agreed that patient does not need emergent thoracentesis.  In fact pleural effusion may be too small to get a thoracentesis.  Patient has no oxygen requirement.  She agreed with coverage for community-acquired pneumonia.  Since patient is on low-dose Cipro already, I will switch him to Levaquin 500 mg daily and will have him follow-up with pulmonary clinic.  If he needs thoracentesis or bronchoscopy for further evaluation, they can arrange that in the clinic.  Gave strict return precautions.   Problems Addressed: Community acquired pneumonia of right lower lobe of lung: acute illness or injury Pleural effusion: acute illness or injury  Amount and/or Complexity of Data Reviewed Labs: ordered. Decision-making details documented in ED Course. Radiology: ordered and independent interpretation performed. Decision-making details documented in ED Course.  Risk Prescription drug management.    Final Clinical Impression(s) / ED Diagnoses Final diagnoses:  None    Rx / DC Orders ED Discharge Orders     None         Drenda Freeze, MD 08/14/22 2027

## 2022-08-15 ENCOUNTER — Telehealth: Payer: Self-pay | Admitting: Critical Care Medicine

## 2022-08-15 NOTE — Telephone Encounter (Signed)
Pulm clinic visit requested (night team contacted that patient needs OP follow up; has not been seen while in the ED)  Julian Hy, DO 08/15/22 5:16 PM [facility]

## 2022-08-16 NOTE — Telephone Encounter (Signed)
Scheduled with AO on 11/14.

## 2022-08-21 ENCOUNTER — Encounter: Payer: Self-pay | Admitting: Pulmonary Disease

## 2022-08-21 ENCOUNTER — Ambulatory Visit: Payer: Medicare Other | Admitting: Pulmonary Disease

## 2022-08-21 VITALS — BP 160/82 | HR 67 | Temp 98.8°F | Ht 68.0 in | Wt 141.0 lb

## 2022-08-21 DIAGNOSIS — J90 Pleural effusion, not elsewhere classified: Secondary | ICD-10-CM

## 2022-08-21 NOTE — Progress Notes (Signed)
Wesley [name]    [id number]    1951-08-17  Primary Care Physician:Smith, Huntley Dec, MD  Referring Physician: Jeannie Done, MD [address],  [place] [address]  Chief complaint:   Was recently seen in the emergency room with right-sided chest pain, some shortness of breath, pain with deep breaths  HPI:  Has had multiple antibiotics for UTI Has had 2 procedures recently to combat a renal stone Recently had a drain placed from his back to address complications from renal stone  Started having some discomfort afterwards and this led to being evaluated in the emergency department  Has used multiple courses of antibiotics when he was dealing with a urinary tract infection, does not feel the infection is ongoing at present  He did lose a little bit of weight when he was acutely ill with the urinary process, this is resolved  When he was evaluated in the emergency room he was treated with a course of Levaquin and this follow-up was ordered  10-pack-year smoking history quit a while back   Outpatient Encounter Medications as of 08/21/2022  Medication Sig   amLODipine (NORVASC) 5 MG tablet Take 5 mg by mouth at bedtime.   celecoxib (CELEBREX) 100 MG capsule Take 100 mg by mouth at bedtime.   Cholecalciferol (VITAMIN D-3) 125 MCG (5000 UT) TABS Take 5,000 Units by mouth daily at 12 noon.   ciclopirox (LOPROX) 0.77 % cream Apply 1 application topically 2 (two) times daily as needed (dry/flaky skin).   Ciclopirox 1 % shampoo Apply 1 application topically daily as needed (dry/flaky skin).   doxepin (SINEQUAN) 25 MG capsule Take 25 mg by mouth at bedtime.   finasteride (PROSCAR) 5 MG tablet Take 5 mg by mouth daily.   levETIRAcetam (KEPPRA) 500 MG tablet Take 500 mg by mouth 3 (three) times daily.   levofloxacin (LEVAQUIN) 500 MG tablet Take 1 tablet (500 mg total) by mouth daily.   losartan (COZAAR) 50 MG tablet Take 50 mg by mouth  daily.   MYRBETRIQ 50 MG TB24 tablet Take 50 mg by mouth every evening.   OXcarbazepine ER (OXTELLAR XR) 600 MG TB24 Take 600 mg by mouth daily in the afternoon.   primidone (MYSOLINE) 50 MG tablet Take 50 mg by mouth at bedtime.   tamsulosin (FLOMAX) 0.4 MG CAPS capsule Take 1 capsule (0.4 mg total) by mouth daily. (Patient taking differently: Take 0.4 mg by mouth at bedtime.)   Testosterone 20.25 MG/ACT (1.62%) GEL Place 3 Pump onto the skin every evening.   Trospium Chloride 60 MG CP24 Take 1 capsule by mouth daily.   ustekinumab (STELARA) 90 MG/ML SOSY injection Inject 90 mg into the skin every 8 (eight) weeks.   vitamin B-12 (CYANOCOBALAMIN) 1000 MCG tablet Take 1,000 mcg by mouth daily.   ciprofloxacin (CIPRO) 250 MG tablet Take 250 mg by mouth daily.   nitroGLYCERIN (NITROSTAT) 0.4 MG SL tablet Place 0.4 mg under the tongue every 5 (five) minutes x 3 doses as needed for chest pain.   traMADol (ULTRAM) 50 MG tablet Take 1 tablet (50 mg total) by mouth every 6 (six) hours as needed. (Patient not taking: Reported on 07/30/2022)   No facility-administered encounter medications on file as of 08/21/2022.    Allergies as of 08/21/2022 - Review Complete 08/21/2022  Allergen Reaction Noted   Sulfa antibiotics Rash 03/01/2012    Past Medical History:  Diagnosis Date   Arthritis  Atypical chest pain    cardiologist-- dr Mauricio Po;  chronic occasional,  pressure / discomfort, LOV 07/14/21 (as of 06/08/22)   Bladder calculus    BPH with obstruction/lower urinary tract symptoms    Follows with Dr. Jacalyn Lefevre, urologist.   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 Bjorn Loser at [facility] neurology, [place] w/ FNP on 03/21/22 in Epic (  as of 06/08/22)   Generalized abdominal pain    w/  bloating due to UC  take doxepin   GERD (gastroesophageal reflux disease)    H/O urinary retention    History of bladder stone    History of hiatal hernia    History of kidney stones    History of prostate cancer 2010   urologist--- dr pace;   dx 2010,  Gleason 4+3, PSA 5.2;   03-04-2009  s/p radiactive prostate seed implants and external beam radiation   History of repair of hiatal hernia    02-11-2017  s/p lap paraesophageal hernia repair w/ nissen fundoplication   History of skin cancer    Hx of resection of meningioma 02/11/2009   benign right temperal lobe meningioma s/p resection  (residual hearing loss)   (neurosurgeon-- dr Jonelle Sports)   Hypertension    followed by cardiologist and pcp   Insomnia    Migraine with aura and without status migrainosus, not intractable    neurologist-- dr Hyman Bower   Prostate cancer [facility])    Simple renal cyst    bilateral ;   followed by urologist   Ulcerative colitis ([place])    followed by dr v. Erlene Quan (GI);  dx 2010;  current treatment stelara q8wks, LOV 06/07/22 (as of 06/08/22)   Wears glasses     Past Surgical History:  Procedure Laterality Date   BRAIN MENINGIOMA EXCISION  02/11/2009   '@[facility]'$ ;  craniotomy , right temporal medial sphonoid wing resection   CARPAL TUNNEL RELEASE Bilateral    right 09-05-2016  '@[facility]'$ ;   left 2013   COLONOSCOPY  12/2019   COLONOSCOPY  01/2022   CYSTOSCOPY  03/05/2012   Procedure: CYSTOSCOPY;  Surgeon: Fredricka Bonine, MD;  [facility];  Service: Urology;  Laterality: N/A;  Cysto Clot Evacuation and Fulgeration Of Bleeders ([place])   CYSTOSCOPY WITH LITHOLAPAXY N/A 03/13/2019   Procedure: CYSTOSCOPY WITH lASER LITHOLAPAXY, DILATION OF STRICTURE;  Surgeon: Kathie Rhodes, MD;  Location: [place];  Service: Urology;  Laterality: N/A;   CYSTOSCOPY WITH LITHOLAPAXY N/A 01/02/2022   Procedure: CYSTOSCOPY WITH LITHOTRIPSY;  Surgeon: Robley Fries, MD;  Location[facility];  Service: Urology;  Laterality: N/A;   CYSTOSCOPY WITH RETROGRADE PYELOGRAM, URETEROSCOPY AND STENT PLACEMENT Right 03/13/2022   Procedure: CYSTOSCOPY WITH RETROGRADE PYELOGRAM, URETEROSCOPY AND STENT PLACEMENT;  Surgeon: Robley Fries, MD;  Location: [facility];  Service: Urology;  Laterality: Right;  7 MINS   CYSTOSCOPY WITH RETROGRADE PYELOGRAM, URETEROSCOPY AND STENT PLACEMENT Right 08/07/2022   Procedure: CYSTOSCOPY WITH RETROGRADE PYELOGRAM, URETEROSCOPY AND STENT PLACEMENT, REMOVAL OF NEPHROURETERAL STENT;  Surgeon: Robley Fries, MD;  [facility];  Service: Urology;  Laterality: Right;  75 MINS   CYSTOSCOPY WITH URETHRAL DILATATION N/A 08/23/2020   Procedure:  CYSTOSCOPY WITH URETHRAL DILATATION, BLADDER BIOPSY, FULGERATION;  Surgeon: Robley Fries, MD;  Location: [facility];  Service: Urology;  Laterality: N/A;  1 HR   CYSTOSCOPY WITH URETHRAL DILATATION N/A 03/13/2021   Procedure: CYSTOSCOPY WITH URETHRAL BALLOON DILATATION/ FOLEY CATHETER PLACEMENT;  Surgeon: Robley Fries, MD;  [facility];  Service: Urology;  Laterality: N/A;   CYSTOSCOPY WITH URETHRAL DILATATION Left 07/08/2009   '@[facility]'$ :   with left  RTG   CYSTOSCOPY WITH URETHRAL DILATATION N/A 06/19/2022   Procedure: CYSTOSCOPY WITH OPTILUME URETHRAL DILATATION;  Surgeon: Robley Fries, MD;  Location: [place];  Service: Urology;  Laterality: N/A;   HOLMIUM LASER APPLICATION N/A 62/70/3500   Procedure: HOLMIUM LASER APPLICATION OF URETHRAL STONE;  Surgeon: Robley Fries, MD;  Location[facility];  Service: Urology;  Laterality: N/A;   HOLMIUM LASER APPLICATION Right 93/81/8299   Procedure: HOLMIUM LASER APPLICATION;  Surgeon: Robley Fries, MD;  Location: [facility];  Service: Urology;  Laterality: Right;   HOLMIUM LASER APPLICATION Right 37/16/9678    Procedure: HOLMIUM LASER APPLICATION;  Surgeon: Robley Fries, MD;  [facility];  Service: Urology;  Laterality: Right;   INGUINAL HERNIA REPAIR Right 05/03/2015   '@[facility]'$    INSERTION PROSTATE RADIATION SEED  03/04/2009   [facility]    IR NEPHROSTOMY PLACEMENT RIGHT  07/18/2022   KNEE CARTILAGE SURGERY Left    1980S   LAPAROSCOPIC PARAESOPHAGEAL HERNIA REPAIR  02/11/2017   '@[facility]'$ ;  W/  NISSEN FUNDOPLICATION (MESH)   PROSTATE BIOPSY     TOE SURGERY     TRANSURETHRAL RESECTION OF BLADDER TUMOR N/A 01/02/2022   Procedure: TRANSURETHRAL RESECTION OF BLADDER TUMOR (TURBT);  Surgeon: Robley Fries, MD;  Location: [facility];  Service: Urology;  Laterality: N/A;    No family history on file.  Social History   Socioeconomic History   Marital status: Married    Spouse name: Not on file   Number of children: Not on file   Years of education: Not on file   Highest education level: Not on file  Occupational History   Not on file  Tobacco Use   Smoking status: Former    Packs/day: 1.00    Years: 10.00    Total pack years: 10.00    Types: Cigarettes    Quit date: 03/02/1987    Years since quitting: 35.4   Smokeless tobacco: Never  Vaping Use   Vaping Use: Never used  Substance and Sexual Activity   Alcohol use: Yes    Comment: Rare   Drug use: Never   Sexual activity: Not on file  Other Topics Concern   Not on file  Social History Narrative   Not on file   Social Determinants of Health   Financial Resource Strain: Not on file  Food Insecurity: Not on file  Transportation Needs: Not on file  Physical Activity: Not on file  Stress: Not on file  Social Connections: Not on file  Intimate Partner Violence: Not on file    Review of Systems  Respiratory:  Negative for chest tightness and shortness of breath.   Psychiatric/Behavioral:  Negative for sleep disturbance.     Vitals:   08/21/22 1340  BP: (!) 160/82  Pulse: 67  Temp: 98.8 F (37.1 C)   SpO2: 95%     Physical Exam Constitutional:      Appearance: Normal appearance.  HENT:     Head: Normocephalic.  Mouth/Throat:     Mouth: Mucous membranes are moist.  Eyes:     Conjunctiva/sclera: Conjunctivae normal.  Cardiovascular:     Rate and Rhythm: Normal rate and regular rhythm.     Heart sounds: No murmur heard.    No friction rub.  Pulmonary:     Effort: No respiratory distress.     Breath sounds: No stridor. No wheezing or rhonchi.  Musculoskeletal:     Cervical back: No rigidity or tenderness.  Neurological:     Mental Status: He is alert.  Psychiatric:        Mood and Affect: Mood normal.      Data Reviewed: CT chest reviewed  Recent abdominal CT reviewed with the patient  Assessment:  Loculated pleural effusion  Chest pain and discomfort-better  Has no symptoms suggesting a respiratory tract infection, did not really have any other symptoms of a respiratory tract infection   Pain is better  Concern with a loculated pleural effusion will include an infectious process versus a neoplastic process -Recent abdominal CT did cover the bases of the lungs and showed no mass lesions -Recent CT also did not reveal effusion-performed in September  Plan/Recommendations: Schedule patient for ultrasound-guided thoracentesis to assess for infection or complicated effusion  Further intervention will depend on findings on that fluid -If the fluid is infected then he may need a chest tube to drain the fluid  If fluid is not infected then a follow-up CT will be appropriate  Has no underlying lung disease to suggest a pulmonary pathology   Follow-up in 3 to 4 weeks  Sherrilyn Rist MD [facility] 08/21/2022, 2:20 PM  CC: Jeannie Done, MD

## 2022-08-21 NOTE — Patient Instructions (Addendum)
For the pleural effusion  -We will schedule you for ultrasound-guided thoracentesis with interventional radiology -They will send the fluid off for testing both for infection and also cancer  -This is unlikely to be cancerous as your recent CT scan did not show any process in the lungs  -Infections can cause fluid like this about your recent blood count is not showing you are fighting an active infection  No other intervention needs put in place at present until we get a sample of the fluid  Follow-up after that may include repeating a CAT scan in a little bit of time to follow-up on resolution of the fluid Sometimes if  infection is found in the fluid it may mean you need to have a chest tube in that space to drain the fluid completely-this may require an admission to the hospital

## 2022-08-22 DIAGNOSIS — N130 Hydronephrosis with ureteropelvic junction obstruction: Secondary | ICD-10-CM | POA: Diagnosis not present

## 2022-08-22 DIAGNOSIS — R351 Nocturia: Secondary | ICD-10-CM | POA: Diagnosis not present

## 2022-08-23 ENCOUNTER — Other Ambulatory Visit: Payer: Self-pay | Admitting: Pulmonary Disease

## 2022-08-23 ENCOUNTER — Ambulatory Visit (HOSPITAL_COMMUNITY)
Admission: RE | Admit: 2022-08-23 | Discharge: 2022-08-23 | Disposition: A | Discharge status: Home / Self Care | Payer: Medicare Other | Source: Ambulatory Visit | Attending: Pulmonary Disease | Admitting: Pulmonary Disease

## 2022-08-23 DIAGNOSIS — J90 Pleural effusion, not elsewhere classified: Secondary | ICD-10-CM | POA: Diagnosis not present

## 2022-08-23 DIAGNOSIS — Z8709 Personal history of other diseases of the respiratory system: Secondary | ICD-10-CM | POA: Diagnosis not present

## 2022-08-23 MED ORDER — LIDOCAINE HCL 1 % IJ SOLN
INTRAMUSCULAR | Status: AC
  Filled 2022-08-23: qty 20

## 2022-09-10 DIAGNOSIS — M25552 Pain in left hip: Secondary | ICD-10-CM | POA: Diagnosis not present

## 2022-09-11 ENCOUNTER — Encounter: Payer: Self-pay | Admitting: Pulmonary Disease

## 2022-09-11 ENCOUNTER — Ambulatory Visit (INDEPENDENT_AMBULATORY_CARE_PROVIDER_SITE_OTHER): Payer: Medicare Other | Admitting: Pulmonary Disease

## 2022-09-11 VITALS — BP 142/84 | HR 80 | Temp 98.0°F | Ht 68.5 in | Wt 142.2 lb

## 2022-09-11 DIAGNOSIS — J90 Pleural effusion, not elsewhere classified: Secondary | ICD-10-CM | POA: Diagnosis not present

## 2022-09-11 NOTE — Progress Notes (Signed)
Wesley [name]    [id number]    [birth year]  Primary Care Physician:Smith, Huntley Dec, MD  Referring Physician: Jeannie Done, MD [address] [facility] [address],  [place] [address]  Chief complaint:   Was recently seen in the emergency room with right-sided chest pain, some shortness of breath, pain with deep breaths  HPI:  Was to IR for ultrasound-guided thoracentesis Did not have an adequate pocket of fluid to be drained  Symptoms are significantly improved Activity tolerance improved Back to his usual  Not having any significant problems with his urologic procedure  He does still have occasional pulling sensation around his rib cage on the right side otherwise no other significant symptoms  Was treated for UTI, had 2 procedures to combat a renal stone, had a stent placed and drain placed Started having some discomfort afterwards and this led to being evaluated in the emergency department Has used multiple courses of antibiotics when he was dealing with a urinary tract infection, does not feel the infection is ongoing at present  He did lose a little bit of weight when he was acutely ill with the urinary process, this is resolved  When he was evaluated in the emergency room he was treated with a course of Levaquin and this follow-up was ordered  10-pack-year smoking history quit a while back   Outpatient Encounter Medications as of 09/11/2022  Medication Sig   amLODipine (NORVASC) 5 MG tablet Take 5 mg by mouth at bedtime.   celecoxib (CELEBREX) 100 MG capsule Take 100 mg by mouth at bedtime.   Cholecalciferol (VITAMIN D-3) 125 MCG (5000 UT) TABS Take 5,000 Units by mouth daily at 12 noon.   ciclopirox (LOPROX) 0.77 % cream Apply 1 application topically 2 (two) times daily as needed (dry/flaky skin).   Ciclopirox 1 % shampoo Apply 1 application topically daily as needed (dry/flaky skin).   doxepin (SINEQUAN) 25 MG capsule Take 25 mg by  mouth at bedtime.   finasteride (PROSCAR) 5 MG tablet Take 5 mg by mouth daily.   levETIRAcetam (KEPPRA) 500 MG tablet Take 500 mg by mouth 3 (three) times daily.   losartan (COZAAR) 50 MG tablet Take 50 mg by mouth daily.   MYRBETRIQ 50 MG TB24 tablet Take 50 mg by mouth every evening.   OXcarbazepine ER (OXTELLAR XR) 600 MG TB24 Take 600 mg by mouth daily in the afternoon.   primidone (MYSOLINE) 50 MG tablet Take 50 mg by mouth at bedtime.   tamsulosin (FLOMAX) 0.4 MG CAPS capsule Take 1 capsule (0.4 mg total) by mouth daily. (Patient taking differently: Take 0.4 mg by mouth at bedtime.)   Testosterone 20.25 MG/ACT (1.62%) GEL Place 3 Pump onto the skin every evening.   Trospium Chloride 60 MG CP24 Take 1 capsule by mouth daily.   ustekinumab (STELARA) 90 MG/ML SOSY injection Inject 90 mg into the skin every 8 (eight) weeks.   vitamin B-12 (CYANOCOBALAMIN) 1000 MCG tablet Take 1,000 mcg by mouth daily.   [DISCONTINUED] levofloxacin (LEVAQUIN) 500 MG tablet Take 1 tablet (500 mg total) by mouth daily.   No facility-administered encounter medications on file as of 09/11/2022.    Allergies as of 09/11/2022 - Review Complete 09/11/2022  Allergen Reaction Noted   Sulfa antibiotics Rash 03/01/2012    Past Medical History:  Diagnosis Date   Arthritis    Atypical chest pain    cardiologist-- dr Mauricio Po;  chronic occasional,  pressure /  discomfort, LOV 07/14/21 (as of 06/08/22)   Bladder calculus    BPH with obstruction/lower urinary tract symptoms    Follows with Dr. Jacalyn Lefevre, urologist.   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 Bjorn Loser at [facility] neurology, [place] w/ FNP on 03/21/22 in Epic ( as of 06/08/22)   Generalized  abdominal pain    w/  bloating due to UC  take doxepin   GERD (gastroesophageal reflux disease)    H/O urinary retention    History of bladder stone    History of hiatal hernia    History of kidney stones    History of prostate cancer 2010   urologist--- dr pace;   dx 2010,  Gleason 4+3, PSA 5.2;   03-04-2009  s/p radiactive prostate seed implants and external beam radiation   History of repair of hiatal hernia    02-11-2017  s/p lap paraesophageal hernia repair w/ nissen fundoplication   History of skin cancer    Hx of resection of meningioma 02/11/2009   benign right temperal lobe meningioma s/p resection  (residual hearing loss)   (neurosurgeon-- dr Jonelle Sports)   Hypertension    followed by cardiologist and pcp   Insomnia    Migraine with aura and without status migrainosus, not intractable    neurologist-- dr Hyman Bower   Prostate cancer [facility])    Simple renal cyst    bilateral ;   followed by urologist   Ulcerative colitis ([place])    followed by dr v. Erlene Quan (GI);  dx 2010;  current treatment stelara q8wks, LOV 06/07/22 (as of 06/08/22)   Wears glasses     Past Surgical History:  Procedure Laterality Date   BRAIN MENINGIOMA EXCISION  02/11/2009   '@[facility]'$ ;  craniotomy , right temporal medial sphonoid wing resection   CARPAL TUNNEL RELEASE Bilateral    right 09-05-2016  '@[facility]'$ ;   left 2013   COLONOSCOPY  12/2019   COLONOSCOPY  01/2022   CYSTOSCOPY  03/05/2012   Procedure: CYSTOSCOPY;  Surgeon: Fredricka Bonine, MD;  [facility];  Service: Urology;  Laterality: N/A;  Cysto Clot Evacuation and Fulgeration Of Bleeders ([place])   CYSTOSCOPY WITH LITHOLAPAXY N/A 03/13/2019   Procedure: CYSTOSCOPY WITH lASER LITHOLAPAXY, DILATION OF STRICTURE;  Surgeon: Kathie Rhodes, MD;  Location: [place];  Service: Urology;  Laterality: N/A;   CYSTOSCOPY WITH LITHOLAPAXY N/A 01/02/2022   Procedure: CYSTOSCOPY WITH LITHOTRIPSY;  Surgeon: Robley Fries, MD;  Location:  [facility];  Service: Urology;  Laterality: N/A;   CYSTOSCOPY WITH RETROGRADE PYELOGRAM, URETEROSCOPY AND STENT PLACEMENT Right 03/13/2022   Procedure: CYSTOSCOPY WITH RETROGRADE PYELOGRAM, URETEROSCOPY AND STENT PLACEMENT;  Surgeon: Robley Fries, MD;  Location: [facility];  Service: Urology;  Laterality: Right;  42 MINS   CYSTOSCOPY WITH RETROGRADE PYELOGRAM, URETEROSCOPY AND STENT PLACEMENT Right 08/07/2022   Procedure: CYSTOSCOPY WITH RETROGRADE PYELOGRAM, URETEROSCOPY AND STENT PLACEMENT, REMOVAL OF NEPHROURETERAL STENT;  Surgeon: Robley Fries, MD;  [facility];  Service: Urology;  Laterality: Right;  75 MINS   CYSTOSCOPY WITH URETHRAL DILATATION N/A 08/23/2020   Procedure: CYSTOSCOPY WITH URETHRAL DILATATION, BLADDER BIOPSY, FULGERATION;  Surgeon: Robley Fries, MD;  Location:  [place];  Service: Urology;  Laterality: N/A;  1 HR   CYSTOSCOPY WITH URETHRAL DILATATION N/A 03/13/2021   Procedure: CYSTOSCOPY WITH URETHRAL BALLOON DILATATION/ FOLEY CATHETER PLACEMENT;  Surgeon: Robley Fries, MD;  [facility];  Service: Urology;  Laterality: N/A;   CYSTOSCOPY WITH URETHRAL DILATATION Left 07/08/2009   '@[facility]'$ :   with left  RTG   CYSTOSCOPY WITH URETHRAL DILATATION N/A 06/19/2022   Procedure: CYSTOSCOPY WITH OPTILUME URETHRAL DILATATION;  Surgeon: Robley Fries, MD;  Location: [place];  Service: Urology;  Laterality: N/A;   HOLMIUM LASER APPLICATION N/A 37/34/2876   Procedure: HOLMIUM LASER APPLICATION OF URETHRAL STONE;  Surgeon: Robley Fries, MD;  [facility];  Service: Urology;  Laterality: N/A;   HOLMIUM LASER APPLICATION Right 81/15/7262   Procedure: HOLMIUM LASER APPLICATION;  Surgeon: Robley Fries, MD;  Location: [facility];  Service: Urology;  Laterality: Right;   HOLMIUM LASER APPLICATION Right 03/55/9741   Procedure: HOLMIUM LASER  APPLICATION;  Surgeon: Robley Fries, MD;  [facility];  Service: Urology;  Laterality: Right;   INGUINAL HERNIA REPAIR Right 05/03/2015   '@[facility]'$    INSERTION PROSTATE RADIATION SEED  03/04/2009   '@[facility]    IR NEPHROSTOMY PLACEMENT RIGHT  07/18/2022   KNEE CARTILAGE SURGERY Left    1980S   LAPAROSCOPIC PARAESOPHAGEAL HERNIA REPAIR  02/11/2017   '@[facility]'$ ;  W/  NISSEN FUNDOPLICATION (MESH)   PROSTATE BIOPSY     TOE SURGERY     TRANSURETHRAL RESECTION OF BLADDER TUMOR N/A 01/02/2022   Procedure: TRANSURETHRAL RESECTION OF BLADDER TUMOR (TURBT);  Surgeon: Robley Fries, MD;  Location: [facility];  Service: Urology;  Laterality: N/A;    No family history on file.  Social History   Socioeconomic History   Marital status: Married    Spouse name: Not on file   Number of children: Not on file   Years of education: Not on file   Highest education level: Not on file  Occupational History   Not on file  Tobacco Use   Smoking status: Former    Packs/day: 1.00    Years: 10.00    Total pack years: 10.00    Types: Cigarettes    Quit date: 03/02/1987    Years since quitting: 35.5   Smokeless tobacco: Never  Vaping Use   Vaping Use: Never used  Substance and Sexual Activity   Alcohol use: Yes    Comment: Rare   Drug use: Never   Sexual activity: Not on file  Other Topics Concern   Not on file  Social History Narrative   Not on file   Social Determinants of Health   Financial Resource Strain: Not on file  Food Insecurity: Not on file  Transportation Needs: Not on file  Physical Activity: Not on file  Stress: Not on file  Social Connections: Not on file  Intimate Partner Violence: Not on file    Review of Systems  Respiratory:  Negative for chest tightness and shortness of breath.   Psychiatric/Behavioral:  Negative for sleep disturbance.     Vitals:   09/11/22 1058  BP: (!) 142/84  Pulse: 80  Temp: 98 F (36.7 C)  SpO2: 96%     Physical  Exam Constitutional:      Appearance: Normal appearance.  HENT:     Head: Normocephalic and atraumatic.  Cardiovascular:     Rate and Rhythm: Normal rate and regular rhythm.  Heart sounds: No murmur heard.    No friction rub.  Pulmonary:     Effort: No respiratory distress.     Breath sounds: No stridor. No wheezing or rhonchi.  Musculoskeletal:     Cervical back: No rigidity or tenderness.  Neurological:     Mental Status: He is alert.  Psychiatric:        Mood and Affect: Mood normal.    Data Reviewed: CT chest reviewed during this visit  IR visit reviewed   Assessment:  Loculated pleural effusion -Did not have significant fluid at attempt at thoracentesis  Chest pain and discomfort continue to resolve  Continues to improve overall however did have an infiltrative process and loculated fluid that will need further follow-up  Plan/Recommendations: Schedule for CT scan of the chest without contrast in about 4 to 6 weeks to follow infiltrative process and loculated fluid  Encouraged to call with any significant concerns  Follow-up in about 7 to 8 weeks  Sherrilyn Rist MD  Pulmonary and Critical Care 09/11/2022, 11:12 AM  CC: Jeannie Done, MD

## 2022-09-11 NOTE — Patient Instructions (Signed)
Obtain CT scan of the chest in 4 to 6 weeks  I will see you in about 6 to 7 weeks from here  Continue staying active  Call us with significant concerns  The CT scan is about the only way to follow-up the haziness in the lungs on the fluid to complete resolution

## 2022-09-19 DIAGNOSIS — Z4889 Encounter for other specified surgical aftercare: Secondary | ICD-10-CM | POA: Diagnosis not present

## 2022-09-19 DIAGNOSIS — R569 Unspecified convulsions: Secondary | ICD-10-CM | POA: Diagnosis not present

## 2022-09-19 DIAGNOSIS — Z79899 Other long term (current) drug therapy: Secondary | ICD-10-CM | POA: Diagnosis not present

## 2022-09-19 DIAGNOSIS — R251 Tremor, unspecified: Secondary | ICD-10-CM | POA: Diagnosis not present

## 2022-09-19 DIAGNOSIS — G40209 Localization-related (focal) (partial) symptomatic epilepsy and epileptic syndromes with complex partial seizures, not intractable, without status epilepticus: Secondary | ICD-10-CM | POA: Diagnosis not present

## 2022-10-03 DIAGNOSIS — N3021 Other chronic cystitis with hematuria: Secondary | ICD-10-CM | POA: Diagnosis not present

## 2022-10-03 DIAGNOSIS — R350 Frequency of micturition: Secondary | ICD-10-CM | POA: Diagnosis not present

## 2022-10-03 DIAGNOSIS — N3941 Urge incontinence: Secondary | ICD-10-CM | POA: Diagnosis not present

## 2022-10-03 DIAGNOSIS — R8271 Bacteriuria: Secondary | ICD-10-CM | POA: Diagnosis not present

## 2022-10-09 DIAGNOSIS — C44329 Squamous cell carcinoma of skin of other parts of face: Secondary | ICD-10-CM | POA: Diagnosis not present

## 2022-10-09 DIAGNOSIS — D485 Neoplasm of uncertain behavior of skin: Secondary | ICD-10-CM | POA: Diagnosis not present

## 2022-10-09 DIAGNOSIS — Z85828 Personal history of other malignant neoplasm of skin: Secondary | ICD-10-CM | POA: Diagnosis not present

## 2022-10-09 DIAGNOSIS — L570 Actinic keratosis: Secondary | ICD-10-CM | POA: Diagnosis not present

## 2022-10-09 DIAGNOSIS — L821 Other seborrheic keratosis: Secondary | ICD-10-CM | POA: Diagnosis not present

## 2022-10-11 ENCOUNTER — Ambulatory Visit
Admission: RE | Admit: 2022-10-11 | Discharge: 2022-10-11 | Disposition: A | Discharge status: Home / Self Care | Payer: Medicare Other | Source: Ambulatory Visit | Attending: Pulmonary Disease | Admitting: Pulmonary Disease

## 2022-10-11 DIAGNOSIS — I700 Atherosclerosis of aorta: Secondary | ICD-10-CM | POA: Diagnosis not present

## 2022-10-11 DIAGNOSIS — J90 Pleural effusion, not elsewhere classified: Secondary | ICD-10-CM | POA: Diagnosis not present

## 2022-10-11 DIAGNOSIS — J189 Pneumonia, unspecified organism: Secondary | ICD-10-CM | POA: Diagnosis not present

## 2022-10-16 DIAGNOSIS — R3914 Feeling of incomplete bladder emptying: Secondary | ICD-10-CM | POA: Diagnosis not present

## 2022-10-19 ENCOUNTER — Ambulatory Visit: Payer: Medicare Other | Admitting: Pulmonary Disease

## 2022-10-19 ENCOUNTER — Encounter: Payer: Self-pay | Admitting: Pulmonary Disease

## 2022-10-19 VITALS — BP 142/84 | HR 84 | Ht 68.0 in | Wt 144.2 lb

## 2022-10-19 DIAGNOSIS — R9389 Abnormal findings on diagnostic imaging of other specified body structures: Secondary | ICD-10-CM | POA: Diagnosis not present

## 2022-10-19 DIAGNOSIS — J90 Pleural effusion, not elsewhere classified: Secondary | ICD-10-CM | POA: Diagnosis not present

## 2022-10-19 DIAGNOSIS — Z23 Encounter for immunization: Secondary | ICD-10-CM

## 2022-10-19 NOTE — Patient Instructions (Signed)
CAT scan is evolving showing improvement in the process at the base of the right lung -Has not cleared up completely and may just need more time  We will repeat your CAT scan in about 6 months  Administer flu shot today  Call us with significant concerns  I will see you 6 months from here

## 2022-10-19 NOTE — Progress Notes (Signed)
Wesley [name]    [id number]    01-Apr-1951  Primary Care Physician:Smith, Huntley Dec, MD  Referring Physician: Jeannie Done, MD [address] [facility] [address],  [place] [address]  Chief complaint:   Was recently seen in the emergency room with right-sided chest pain, some shortness of breath, pain with deep breaths Found to have loculated pleural effusion, did not need drainage  HPI:  Has been doing relatively well with no significant concerns about his breathing  No significant shortness of breath No chest pain or chest discomfort  Back to his usual activities  Had a urologic procedure that led to pleural effusion  Was treated for UTI, had 2 procedures to combat a renal stone, had a stent placed and drain placed Started having some discomfort afterwards and this led to being evaluated in the emergency department Has used multiple courses of antibiotics when he was dealing with a urinary tract infection, does not feel the infection is ongoing at present  He did lose a little bit of weight when he was acutely ill with the urinary process, this is resolved  When he was evaluated in the emergency room he was treated with a course of Levaquin and this follow-up was ordered  10-pack-year smoking history quit a while back   Outpatient Encounter Medications as of 10/19/2022  Medication Sig   amLODipine (NORVASC) 5 MG tablet Take 5 mg by mouth at bedtime.   celecoxib (CELEBREX) 100 MG capsule Take 100 mg by mouth at bedtime.   Cholecalciferol (VITAMIN D-3) 125 MCG (5000 UT) TABS Take 5,000 Units by mouth daily at 12 noon.   ciclopirox (LOPROX) 0.77 % cream Apply 1 application topically 2 (two) times daily as needed (dry/flaky skin).   Ciclopirox 1 % shampoo Apply 1 application topically daily as needed (dry/flaky skin).   doxepin (SINEQUAN) 25 MG capsule Take 25 mg by mouth at bedtime.   finasteride (PROSCAR) 5 MG tablet Take 5 mg by mouth daily.    levETIRAcetam (KEPPRA) 500 MG tablet Take 500 mg by mouth 3 (three) times daily.   losartan (COZAAR) 50 MG tablet Take 50 mg by mouth daily.   MYRBETRIQ 50 MG TB24 tablet Take 50 mg by mouth every evening.   OXcarbazepine ER (OXTELLAR XR) 600 MG TB24 Take 600 mg by mouth daily in the afternoon.   primidone (MYSOLINE) 50 MG tablet Take 75 mg by mouth at bedtime.   tamsulosin (FLOMAX) 0.4 MG CAPS capsule Take 1 capsule (0.4 mg total) by mouth daily. (Patient taking differently: Take 0.4 mg by mouth at bedtime.)   Testosterone 20.25 MG/ACT (1.62%) GEL Place 3 Pump onto the skin every evening.   Trospium Chloride 60 MG CP24 Take 1 capsule by mouth daily.   ustekinumab (STELARA) 90 MG/ML SOSY injection Inject 90 mg into the skin every 8 (eight) weeks.   vitamin B-12 (CYANOCOBALAMIN) 1000 MCG tablet Take 1,000 mcg by mouth daily.   No facility-administered encounter medications on file as of 10/19/2022.    Allergies as of 10/19/2022 - Review Complete 10/19/2022  Allergen Reaction Noted   Sulfa antibiotics Rash 03/01/2012    Past Medical History:  Diagnosis Date   Arthritis    Atypical chest pain    cardiologist-- dr Mauricio Po;  chronic occasional,  pressure / discomfort, LOV 07/14/21 (as of 06/08/22)   Bladder calculus    BPH with obstruction/lower urinary tract symptoms    Follows with Dr. Simone Curia  Pace, urologist.   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 Bjorn Loser at [facility] neurology, [place] w/ FNP on 03/21/22 in Epic ( as of 06/08/22)   Generalized abdominal pain    w/  bloating due to UC  take doxepin   GERD (gastroesophageal reflux disease)    H/O urinary retention    History of bladder stone    History of hiatal hernia     History of kidney stones    History of prostate cancer 2010   urologist--- dr pace;   dx 2010,  Gleason 4+3, PSA 5.2;   03-04-2009  s/p radiactive prostate seed implants and external beam radiation   History of repair of hiatal hernia    02-11-2017  s/p lap paraesophageal hernia repair w/ nissen fundoplication   History of skin cancer    Hx of resection of meningioma 02/11/2009   benign right temperal lobe meningioma s/p resection  (residual hearing loss)   (neurosurgeon-- dr Jonelle Sports)   Hypertension    followed by cardiologist and pcp   Insomnia    Migraine with aura and without status migrainosus, not intractable    neurologist-- dr Hyman Bower   Prostate cancer [facility])    Simple renal cyst    bilateral ;   followed by urologist   Ulcerative colitis ([place])    followed by dr v. Erlene Quan (GI);  dx 2010;  current treatment stelara q8wks, LOV 06/07/22 (as of 06/08/22)   Wears glasses     Past Surgical History:  Procedure Laterality Date   BRAIN MENINGIOMA EXCISION  02/11/2009   '@[facility]'$ ;  craniotomy , right temporal medial sphonoid wing resection   CARPAL TUNNEL RELEASE Bilateral    right 09-05-2016  '@[facility]'$ ;   left 2013   COLONOSCOPY  12/2019   COLONOSCOPY  01/2022   CYSTOSCOPY  03/05/2012   Procedure: CYSTOSCOPY;  Surgeon: Fredricka Bonine, MD;  Location: [facility];  Service: Urology;  Laterality: N/A;  Cysto Clot Evacuation and Fulgeration Of [place] ([place])   CYSTOSCOPY WITH LITHOLAPAXY N/A 03/13/2019   Procedure: CYSTOSCOPY WITH lASER LITHOLAPAXY, DILATION OF STRICTURE;  Surgeon: Kathie Rhodes, MD;  Location: [place];  Service: Urology;  Laterality: N/A;   CYSTOSCOPY WITH LITHOLAPAXY N/A 01/02/2022   Procedure: CYSTOSCOPY WITH LITHOTRIPSY;  Surgeon: Robley Fries, MD;  Location: [facility];  Service: Urology;  Laterality: N/A;   CYSTOSCOPY WITH RETROGRADE PYELOGRAM, URETEROSCOPY AND STENT PLACEMENT Right 03/13/2022   Procedure: CYSTOSCOPY WITH  RETROGRADE PYELOGRAM, URETEROSCOPY AND STENT PLACEMENT;  Surgeon: Robley Fries, MD;  Location: [facility];  Service: Urology;  Laterality: Right;  28 MINS   CYSTOSCOPY WITH RETROGRADE PYELOGRAM, URETEROSCOPY AND STENT PLACEMENT Right 08/07/2022   Procedure: CYSTOSCOPY WITH RETROGRADE PYELOGRAM, URETEROSCOPY AND STENT PLACEMENT, REMOVAL OF NEPHROURETERAL STENT;  Surgeon: Robley Fries, MD;  Location[facility];  Service: Urology;  Laterality: Right;  75 MINS   CYSTOSCOPY WITH URETHRAL DILATATION N/A 08/23/2020   Procedure: CYSTOSCOPY WITH URETHRAL DILATATION, BLADDER BIOPSY, FULGERATION;  Surgeon: Robley Fries, MD;  Location: [place];  Service: Urology;  Laterality: N/A;  1 HR   CYSTOSCOPY WITH URETHRAL DILATATION N/A 03/13/2021   Procedure: CYSTOSCOPY WITH  URETHRAL BALLOON DILATATION/ FOLEY CATHETER PLACEMENT;  Surgeon: Robley Fries, MD;  [facility];  Service: Urology;  Laterality: N/A;   CYSTOSCOPY WITH URETHRAL DILATATION Left 07/08/2009   '@[facility]'$ :   with left  RTG   CYSTOSCOPY WITH URETHRAL DILATATION N/A 06/19/2022   Procedure: CYSTOSCOPY WITH OPTILUME URETHRAL DILATATION;  Surgeon: Robley Fries, MD;  Location: [place];  Service: Urology;  Laterality: N/A;   HOLMIUM LASER APPLICATION N/A 34/19/6222   Procedure: HOLMIUM LASER APPLICATION OF URETHRAL STONE;  Surgeon: Robley Fries, MD;  Location: [facility];  Service: Urology;  Laterality: N/A;   HOLMIUM LASER APPLICATION Right 97/98/9211   Procedure: HOLMIUM LASER APPLICATION;  Surgeon: Robley Fries, MD;  Location: [facility];  Service: Urology;  Laterality: Right;   HOLMIUM LASER APPLICATION Right 94/17/4081   Procedure: HOLMIUM LASER APPLICATION;  Surgeon: Robley Fries, MD;  [facility];  Service: Urology;  Laterality: Right;   INGUINAL HERNIA REPAIR Right 05/03/2015   '@[facility]'$    INSERTION PROSTATE RADIATION SEED   03/04/2009   '@[facility]'$    IR NEPHROSTOMY PLACEMENT RIGHT  07/18/2022   KNEE CARTILAGE SURGERY Left    1980S   LAPAROSCOPIC PARAESOPHAGEAL HERNIA REPAIR  02/11/2017   '@[facility]'$ ;  W/  NISSEN FUNDOPLICATION ([place])   PROSTATE BIOPSY     TOE SURGERY     TRANSURETHRAL RESECTION OF BLADDER TUMOR N/A 01/02/2022   Procedure: TRANSURETHRAL RESECTION OF BLADDER TUMOR (TURBT);  Surgeon: Robley Fries, MD;  Location: [facility];  Service: Urology;  Laterality: N/A;    No family history on file.  Social History   Socioeconomic History   Marital status: Married    Spouse name: Not on file   Number of children: Not on file   Years of education: Not on file   Highest education level: Not on file  Occupational History   Not on file  Tobacco Use   Smoking status: Former    Packs/day: 1.00    Years: 10.00    Total pack years: 10.00    Types: Cigarettes    Quit date: 03/02/1987    Years since quitting: 35.6   Smokeless tobacco: Never  Vaping Use   Vaping Use: Never used  Substance and Sexual Activity   Alcohol use: Yes    Comment: Rare   Drug use: Never   Sexual activity: Not on file  Other Topics Concern   Not on file  Social History Narrative   Not on file   Social Determinants of Health   Financial Resource Strain: Not on file  Food Insecurity: Not on file  Transportation Needs: Not on file  Physical Activity: Not on file  Stress: Not on file  Social Connections: Not on file  Intimate Partner Violence: Not on file    Review of Systems  Respiratory:  Negative for chest tightness and shortness of breath.   Psychiatric/Behavioral:  Negative for sleep disturbance.     Vitals:   10/19/22 1018  BP: (!) 142/84  Pulse: 84  SpO2: 95%     Physical Exam Constitutional:      Appearance: Normal appearance.  HENT:     Head: Normocephalic and atraumatic.  Eyes:     General: No scleral icterus. Cardiovascular:     Rate and Rhythm: Normal rate and regular rhythm.      Heart sounds: No murmur heard.    No friction rub.  Pulmonary:     Effort: No  respiratory distress.     Breath sounds: No stridor. No wheezing or rhonchi.  Musculoskeletal:     Cervical back: No rigidity or tenderness.  Neurological:     Mental Status: He is alert.  Psychiatric:        Mood and Affect: Mood normal.    Data Reviewed: CT chest reviewed during this visit  Most recent CT scan of the chest reviewed with the patient-10/11/2022  IR visit reviewed   Assessment:  Loculated pleural effusion -Did not have significant fluid at attempt at thoracentesis -Fluid collection has completely resolved on recent CT scan of the chest  Does have an infiltrate right lower lobe that has also improved -Pleural-based density -The improvement in the size of her lesion is encouraging  Continues to improve overall however did have an infiltrative process and loculated fluid that will need further follow-up  Plan/Recommendations: CT scan of the chest in 6 months to follow-up on pleural-based density  Administer flu shot today  Follow-up in about 6 months  Encouraged to call with significant concerns  I spent 30 minutes dedicated to the care of this patient on the date of this encounter to include previsit review of records, face-to-face time with the patient discussing conditions above, reviewing his current CT scan both before the visit and during the visit, post visit ordering of testing, clinical documentation with electronic health record.  Sherrilyn Rist MD [facility] 10/19/2022, 10:35 AM  CC: Jeannie Done, MD

## 2022-10-20 DIAGNOSIS — U071 COVID-19: Secondary | ICD-10-CM | POA: Diagnosis not present

## 2022-10-25 DIAGNOSIS — I10 Essential (primary) hypertension: Secondary | ICD-10-CM | POA: Diagnosis not present

## 2022-10-25 DIAGNOSIS — E785 Hyperlipidemia, unspecified: Secondary | ICD-10-CM | POA: Diagnosis not present

## 2022-10-26 DIAGNOSIS — N3021 Other chronic cystitis with hematuria: Secondary | ICD-10-CM | POA: Diagnosis not present

## 2022-10-26 DIAGNOSIS — N3946 Mixed incontinence: Secondary | ICD-10-CM | POA: Diagnosis not present

## 2022-10-26 DIAGNOSIS — R3914 Feeling of incomplete bladder emptying: Secondary | ICD-10-CM | POA: Diagnosis not present

## 2022-10-29 DIAGNOSIS — I129 Hypertensive chronic kidney disease with stage 1 through stage 4 chronic kidney disease, or unspecified chronic kidney disease: Secondary | ICD-10-CM | POA: Diagnosis not present

## 2022-10-29 DIAGNOSIS — N1832 Chronic kidney disease, stage 3b: Secondary | ICD-10-CM | POA: Diagnosis not present

## 2022-10-29 DIAGNOSIS — G40209 Localization-related (focal) (partial) symptomatic epilepsy and epileptic syndromes with complex partial seizures, not intractable, without status epilepticus: Secondary | ICD-10-CM | POA: Diagnosis not present

## 2022-10-29 DIAGNOSIS — K5150 Left sided colitis without complications: Secondary | ICD-10-CM | POA: Diagnosis not present

## 2022-10-29 DIAGNOSIS — Z0000 Encounter for general adult medical examination without abnormal findings: Secondary | ICD-10-CM | POA: Diagnosis not present

## 2022-11-06 DIAGNOSIS — C44329 Squamous cell carcinoma of skin of other parts of face: Secondary | ICD-10-CM | POA: Diagnosis not present

## 2022-11-09 DIAGNOSIS — H04123 Dry eye syndrome of bilateral lacrimal glands: Secondary | ICD-10-CM | POA: Diagnosis not present

## 2022-11-09 DIAGNOSIS — H43813 Vitreous degeneration, bilateral: Secondary | ICD-10-CM | POA: Diagnosis not present

## 2022-11-09 DIAGNOSIS — H52203 Unspecified astigmatism, bilateral: Secondary | ICD-10-CM | POA: Diagnosis not present

## 2022-11-09 DIAGNOSIS — H526 Other disorders of refraction: Secondary | ICD-10-CM | POA: Diagnosis not present

## 2022-11-09 DIAGNOSIS — H25813 Combined forms of age-related cataract, bilateral: Secondary | ICD-10-CM | POA: Diagnosis not present

## 2022-11-14 ENCOUNTER — Telehealth: Payer: Self-pay | Admitting: Pulmonary Disease

## 2022-11-14 MED ORDER — FLUTICASONE FUROATE-VILANTEROL 100-25 MCG/ACT IN AEPB: 1.0000 | INHALATION_SPRAY | Freq: Every day | RESPIRATORY_TRACT | 0 refills | Status: DC

## 2022-11-14 MED ORDER — HYDROCODONE BIT-HOMATROP MBR 5-1.5 MG/5ML PO SOLN: 5.0000 mL | Freq: Four times a day (QID) | ORAL | 0 refills | Status: DC | PRN

## 2022-11-14 NOTE — Telephone Encounter (Signed)
Spoke with patient in regards to cough syrup and Breo inhaler sent to pharmacy. Nothing further needed at this time.

## 2022-11-14 NOTE — Telephone Encounter (Signed)
Prescription for Hydromet-for cough sent to pharmacy  Prescription for Breo-inhaler 1 puff to be used daily-this should help with inflammation, swelling, irritation in the airway  Unfortunately, sometimes lingering cough does take a while to resolve

## 2022-11-14 NOTE — Telephone Encounter (Signed)
Called and spoke with pt who states on 1/15 he was diagnosed with covid. Pt said he does not feel bad but since diagnosis of covid, he has had a lingering cough that will not go away.  Pt has been taking OTC coricidin chest and congestion as well as robitussin DM Max but still coughing. Pt states that he has had a tickle in his chest that makes him want to cough. Pt also said that he has some associated wheezing.   Pt wants to know what might be able to be recommended for him due to this. Dr. Jenetta Downer, please advise.

## 2022-11-20 DIAGNOSIS — L820 Inflamed seborrheic keratosis: Secondary | ICD-10-CM | POA: Diagnosis not present

## 2022-12-10 DIAGNOSIS — M25552 Pain in left hip: Secondary | ICD-10-CM | POA: Diagnosis not present

## 2022-12-10 DIAGNOSIS — S29012D Strain of muscle and tendon of back wall of thorax, subsequent encounter: Secondary | ICD-10-CM | POA: Diagnosis not present

## 2022-12-11 ENCOUNTER — Other Ambulatory Visit: Payer: Self-pay | Admitting: Pulmonary Disease

## 2022-12-13 DIAGNOSIS — R1084 Generalized abdominal pain: Secondary | ICD-10-CM | POA: Diagnosis not present

## 2022-12-13 DIAGNOSIS — K5190 Ulcerative colitis, unspecified, without complications: Secondary | ICD-10-CM | POA: Diagnosis not present

## 2022-12-13 DIAGNOSIS — R140 Abdominal distension (gaseous): Secondary | ICD-10-CM | POA: Diagnosis not present

## 2022-12-14 DIAGNOSIS — D23121 Other benign neoplasm of skin of left upper eyelid, including canthus: Secondary | ICD-10-CM | POA: Diagnosis not present

## 2022-12-14 DIAGNOSIS — D485 Neoplasm of uncertain behavior of skin: Secondary | ICD-10-CM | POA: Diagnosis not present

## 2022-12-19 DIAGNOSIS — G250 Essential tremor: Secondary | ICD-10-CM | POA: Diagnosis not present

## 2022-12-19 DIAGNOSIS — G40209 Localization-related (focal) (partial) symptomatic epilepsy and epileptic syndromes with complex partial seizures, not intractable, without status epilepticus: Secondary | ICD-10-CM | POA: Diagnosis not present

## 2023-01-01 DIAGNOSIS — M25552 Pain in left hip: Secondary | ICD-10-CM | POA: Diagnosis not present

## 2023-01-01 DIAGNOSIS — N3021 Other chronic cystitis with hematuria: Secondary | ICD-10-CM | POA: Diagnosis not present

## 2023-01-09 DIAGNOSIS — R310 Gross hematuria: Secondary | ICD-10-CM | POA: Diagnosis not present

## 2023-01-09 DIAGNOSIS — R8271 Bacteriuria: Secondary | ICD-10-CM | POA: Diagnosis not present

## 2023-01-09 DIAGNOSIS — N3000 Acute cystitis without hematuria: Secondary | ICD-10-CM | POA: Diagnosis not present

## 2023-01-09 DIAGNOSIS — R350 Frequency of micturition: Secondary | ICD-10-CM | POA: Diagnosis not present

## 2023-01-09 DIAGNOSIS — R3915 Urgency of urination: Secondary | ICD-10-CM | POA: Diagnosis not present

## 2023-01-10 DIAGNOSIS — L821 Other seborrheic keratosis: Secondary | ICD-10-CM | POA: Diagnosis not present

## 2023-01-10 DIAGNOSIS — C44629 Squamous cell carcinoma of skin of left upper limb, including shoulder: Secondary | ICD-10-CM | POA: Diagnosis not present

## 2023-01-10 DIAGNOSIS — L570 Actinic keratosis: Secondary | ICD-10-CM | POA: Diagnosis not present

## 2023-01-10 DIAGNOSIS — D485 Neoplasm of uncertain behavior of skin: Secondary | ICD-10-CM | POA: Diagnosis not present

## 2023-01-10 DIAGNOSIS — Z85828 Personal history of other malignant neoplasm of skin: Secondary | ICD-10-CM | POA: Diagnosis not present

## 2023-01-11 DIAGNOSIS — R3915 Urgency of urination: Secondary | ICD-10-CM | POA: Diagnosis not present

## 2023-01-11 DIAGNOSIS — R350 Frequency of micturition: Secondary | ICD-10-CM | POA: Diagnosis not present

## 2023-01-11 DIAGNOSIS — N3021 Other chronic cystitis with hematuria: Secondary | ICD-10-CM | POA: Diagnosis not present

## 2023-01-11 DIAGNOSIS — R310 Gross hematuria: Secondary | ICD-10-CM | POA: Diagnosis not present

## 2023-01-11 DIAGNOSIS — N1330 Unspecified hydronephrosis: Secondary | ICD-10-CM | POA: Diagnosis not present

## 2023-01-11 DIAGNOSIS — N200 Calculus of kidney: Secondary | ICD-10-CM | POA: Diagnosis not present

## 2023-01-14 ENCOUNTER — Other Ambulatory Visit: Payer: Self-pay | Admitting: Urology

## 2023-01-18 ENCOUNTER — Other Ambulatory Visit: Payer: Self-pay

## 2023-01-18 ENCOUNTER — Encounter (HOSPITAL_BASED_OUTPATIENT_CLINIC_OR_DEPARTMENT_OTHER): Payer: Self-pay | Admitting: Urology

## 2023-01-18 NOTE — Progress Notes (Addendum)
Spoke w/ via phone for pre-op interview: patient Lab needs dos: EKG, BMP Lab results: NA COVID test: patient states asymptomatic no test needed. Arrive at 0800 01/29/23 NPO after MN except clear liquids.Clear liquids from MN until 0600 Med rec completed. Medications to take morning of surgery: finasteride, Breo Ellipta,Leveteiracetam, and Trospium Chloride  Diabetic medication: NA  Patient instructed to bring photo id and insurance card day of surgery. Patient aware to have Driver (ride ) / caregiver for 24 hours after surgery. Son to drive. Patient Special Instructions: NA Pre-Op special Istructions: NA Patient verbalized understanding of instructions that were given at this phone interview. Patient denies shortness of breath, chest pain, fever, cough at this phone interview.

## 2023-01-25 DIAGNOSIS — D23121 Other benign neoplasm of skin of left upper eyelid, including canthus: Secondary | ICD-10-CM | POA: Diagnosis not present

## 2023-01-25 DIAGNOSIS — D04121 Carcinoma in situ of skin of left upper eyelid, including canthus: Secondary | ICD-10-CM | POA: Diagnosis not present

## 2023-01-25 NOTE — H&P (Signed)
CC/HPI: cc: Urolithiasis, UTI follow-up   Prostate Cancer - patient's PSA on 04/21/2020 is 0.015. He was treated with brachytherapy and external beam.  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 No further surveillance imaging needed.  ED - patient uses sildenafil which helps. He will let me know when he needs a refill.  BPH - patient using tamsulosin and finasteride without difficulty. His urinary symptoms are stable.  Low testosterone - patient uses testosterone replacement and most recent testosterone levels were in the 500s.  Urethral stricture - s/p dilation  Urolithiasis     07/25/2022: 72 year old man with a complicated past urologic history including prostate cancer treated with brachytherapy, bulbar urethral stricture, overactive bladder, ED and most recently urolithiasis with possible right hydronephrosis and worsening kidney function. Patient recently underwent dilation of bulbar urethral stricture and was found to have obliterated prostatic urethra with extruded brachytherapy seeds and unable to clearly visualize bilateral UOs. In addition patient had worsening kidney function which is slightly improved to a creatinine of 1.6 from 2.0 prior to the procedure. However last year creatinine was approximately 1. He also continues to struggle with overactive bladder and is on trospium. He does have fairly significant leakage. He has been using Myrbetriq over the last few weeks which have helped his symptoms. He also underwent placement of a right nephroureteral stent that has been capped. He is tolerating the stent okay.   08/14/2022: 72 year old man who recently underwent removal of right percutaneous nephrostomy tube and right ureteroscopy with laser lithotripsy and stent placement comes in with persistent postoperative pain. It hurts to take a deep breath and he feels swollen around his right side. He is voiding well and denies any fevers.   08/22/2022: 72 year old man with right  renal calculus and obstructed right UO due to brachytherapy effect at bladder neck here for stent removal. He underwent right PCN/NUS placement followed by right ureteroscopy and conversion to double-J stent. Following that procedure he had severe right upper quadrant pain was found to have a right pleural effusion and atelectasis. He was seen by pulmonology yesterday and has an appointment for thoracentesis tomorrow.    10/03/22: 72 year old man with a complicated past urologic history including prostate cancer treated with brachytherapy, bulbar urethral stricture, overactive bladder, ED and most recently urolithiasis with possible right hydronephrosis and worsening kidney function patient has previously undergone right ureteroscopy following right PCN placement due to inability to locate right ureteral orifice. All stents and nephrostomy tubes have been removed. He has been doing well for the last 6 weeks. His biggest issue at the moment is urinary frequency, urgency and urge incontinence. He is on Myrbetriq and trospium. He has not had any fever or signs of UTI however his urine remains chronically infected.    10/26/22: 72 year old man with complicated past urologic history who is well-known to me. He is here today for follow-up of urodynamic study. He did have a UTI as well as some urinary retention following the urodynamic study. His symptoms have resolved and urinalysis looks improved today. He has both stress and urge urinary continence as well as the need to strain to urinate.   UDS SUMMARY  Ms. Zellman held a max capacity of approx. 184 mls. His 1st sensation was felt at 77 mls. There was positive SUI with UDS line in place. He leaked with both coughing and Valsalva. Please see information above. There was positive instability. He felt an increase urge at the time but was able to inhibit  leaking. He tried to guard against leaking. He was able to generate a voluntary contraction and void 69 mls  with max flow of 8 ml/s. There was some straining artifact that interrupted the tracing of the bladder muscle pressure. EMG leads were basically quiet during the voiding phase. PVR was approx. 114 mls. No trabeculation was noted. No reflux was seen. Brachytherapy seeds noted.    01/09/23: 72 year old man with complicated past urologic history who is well-known to me. He developed brown/old blood in the urine about 6 weeks ago. Then last week he developed hematuria with worsening frequency and small volume voids. He was having extreme difficulty passing urine and then passed large blood clots. He has had no flank pain and no significant fever. His urinalysis and culture did show infection last week and he was started on doxycycline initially which was changed to Cipro after culture returned. He also had lab work done about a week ago that showed a creatinine of 2.3 which is increased from his baseline. Renal ultrasound today shows some new fullness on the left side concerning for obstruction. His baseline overactive bladder is significantly worse and he is voiding every 15 minutes to 1 hour max.   01/11/2023: 72 year old man with a complicated past urologic history who continues to have gross hematuria with passage of clots. Kidney function was checked 2 days ago and creatinine came back at 1.6 down from 2.3 when it was checked the week prior. He remains on Cipro 250 mg twice daily. No interim flank pain. Frequency and urgency are still present.     ALLERGIES: Sulfa Drugs    MEDICATIONS: Finasteride 5 mg tablet 1 tablet PO Daily  Myrbetriq 50 mg tablet, extended release 24 hr  Tamsulosin Hcl 0.4 mg capsule  Amlodipine Besylate 5 mg tablet Oral  Ciclopirox 0.77 % cream External  Ciprofloxacin Hcl 500 mg tablet 1 tablet PO BID  Doxepin Hcl 25 mg capsule capsule  LevETIRAcetam 500 MG Oral Tablet Oral  Losartan Potassium 50 mg tablet  Oxtellar Xr 600 mg tablet, extended release 24 hr Oral  Primidone   Stelara     GU PSH: Complex cystometrogram, w/ void pressure and urethral pressure profile studies, any technique - 10/16/2022 Complex Uroflow - 10/16/2022 Cysto Bladder Stone <2.5cm - 08/23/2020, 2020 Cysto Bladder Ureth Biopsy - 2013 Cysto Dilate Stricture (M or F) - 06/19/2022, 03/13/2021, 08/23/2020, 2020 Cysto Remove Stent FB Sim - 08/22/2022, 04/06/2022 Cystoscopy - 06/06/2022, 12/14/2021, 03/10/2021, 08/11/2020, 2020 Cystoscopy TURBT <2 cm - 08/23/2020 Cystoscopy Ureteroscopy - 2010 Emg surf Electrd - 10/16/2022 Inject For cystogram - 10/16/2022 Intrabd voidng Press - 10/16/2022 Locm 300-399Mg /Ml Iodine,1Ml - 06/29/2022, 2020, 2020 TRANSPERI NEEDLE PLACE, PROS - 2015 Ureteroscopic laser litho, Right - 08/07/2022, 03/13/2022       PSH Notes: Inguinal Hernia Repair, Surgery Prostate Transperineal Placement Of Needles, Cystoscopy With Biopsy, Craniotomy Suboccipital Excision Of Meningioma, Cystoscopy With Ureteroscopy Left, Arthroscopy Knee Right   NON-GU PSH: Knee Arthroscopy; Dx - 2009 Remove Brain Lining Lesion - 2011     GU PMH: Acute Cystitis/UTI (Stable) - 01/09/2023, - 04/18/2022, - 02/22/2022, - 11/28/2021 Gross hematuria (Stable) - 01/09/2023, - 03/02/2022, - 01/05/2022, - 09/13/2020, He is experienced gross hematuria and has had previous ionizing radiation and therefore a full evaluation will be undertaken. He has had a history of stones as well so I will obtain a CT scan to evaluate the upper tract and then he will return for completion of his workup with cystoscopy., - 2020 Urinary Frequency (  Stable) - 01/09/2023, - 10/03/2022, - 06/27/2022 (Stable), - 06/06/2022, He is having some new frequency and his nocturia has increased as well. He is using tamsulosin and also Celebrex which resulted in resolution of his nocturia but what I have recommended is a trial of Myrbetriq 25 mg. I have given him samples of this., - 2018, Increased urinary frequency, - 2014 Urinary Urgency (Stable) - 01/09/2023, - 10/03/2022  (Stable), - 06/06/2022, - 05/07/2022, - 04/20/2021, - 09/13/2020 (Worsening), He uses DDAVP at night but still is having some nocturia and also is having some urgency and therefore I have recommended a trial of Toviaz to see if he notes this helps any better than the Myrbetriq., - 2019 Chronic cystitis (with hematuria) - 10/26/2022, - 10/03/2022 History of prostate cancer - 10/26/2022, - 08/22/2022, - 06/27/2022, - 05/07/2022, - 04/18/2022, - 01/16/2022, - 12/14/2021, - 03/16/2021, Repeat PSA in July 2022, - 2022, - 09/13/2020, He has nothing to suggest recurrence with no abnormality on DRE and a PSA that remains undetectable., - 2021 (Stable), Although he has a history of prostate cancer treated with radiation I told him I was not particularly concerned about the possibility of this being secondary to prostate cancer but rather more likely secondary to the effects of radiation., - 2020 (Stable), His prostate cancer remains under excellent control with no abnormality noted on DRE and PSA that remains undetectable., - 2019 (Stable), His erectile dysfunction is likely multifactorial but most likely secondary to the treatment of his prostate cancer with radiation. Also up with me as previously scheduled for his annual prostate cancer follow-up appointment, - 2019 (Stable), At this point it appears his prostate cancer has been well controlled with a PSA that is currently 0.017. I will continue to see him on a yearly basis., - 2018 (Stable), His prostate remains flat and benign to examination. His PSA remains undetectable. I will continue to see him on a yearly basis for DRE and PSA., - 2017, History of prostate cancer, - 2016 Incomplete bladder emptying - 10/26/2022, - 10/16/2022, - 03/03/2021 Mixed incontinence (Stable) - 10/26/2022, - 10/16/2022 Urge incontinence - 10/03/2022, - 06/27/2022, - 06/06/2022 Hydronephrosis - 08/22/2022, - 07/25/2022, - 06/29/2022, - 06/27/2022 (Stable), - 06/22/2022, - 05/07/2022 Nocturia - 08/22/2022, - 2022,  - 09/13/2020, Nocturia, - 2016 RUQ pain - 08/14/2022 Renal calculus - 07/25/2022, - 06/22/2022 (Stable), - 06/06/2022, Right, - 05/29/2022, Right, He has nonobstructing right renal calculi. I do not feel that this in any way contributed to his gross hematuria, - 2020 Bulbar urethral stricture - 06/27/2022, - 06/22/2022, - 06/06/2022, - 05/07/2022, - 04/18/2022, - 01/16/2022, - 12/14/2021, - 11/28/2021, - 04/20/2021, - 03/28/2021, - 03/16/2021, - 03/10/2021, Patient is voiding well without difficulty. Will repeat PVR next visit., - 2022, - 09/13/2020, - 08/26/2020, Based on cystoscopy today would like to proceed with cystoscopy, balloon dilation of urethral stricture, and laser lithotripsy of stones. I discussed the risks and benefits of the procedure including but not limited to pain, bleeding, recurrence of stricture, damage to surrounding structures including urethra and bladder, need for Foley catheter or additional procedures after this. Patient understands and wishes to proceed., - 08/11/2020, I discovered a moderate bulbar urethral stricture that would not allow passage of the flexible cystoscope even with pressure and I did not want to cause him any discomfort in the office since I am going to have to take care of his bladder calculi as well so we discussed performing dilation at the time of his cystolitholapaxy., - 2020 Renal  cyst (Stable) - 06/22/2022, Right, No further imaging necessary for simple cyst, - 2022, Right, Has Bosniak 2 F cysts. It was suggested that further characterization may be attained with an MRI scan so I will schedule him for that in 6 months., - 2021, Renal cysts, acquired, bilateral, - 2014 BPH w/LUTS - 05/07/2022, - 01/16/2022, - 12/14/2021 (Stable), Patient to continue tamsulosin and finasteride., - 2022, Benign prostatic hyperplasia with urinary obstruction, - 2015 Calculus of LUT, Unspec - 05/07/2022, - 08/26/2020, - 08/11/2020 Ureteral calculus - 04/18/2022, - 04/06/2022 (Stable), - 03/02/2022, Ureteral  Stone, - 2014 Bladder Stone - 01/16/2022, - 01/05/2022 (Stable), - 12/14/2021, The source of his gross hematuria is almost certainly the stones located within his bladder on CT scan. I was not able to visualize these due to his stricture., - 2020 Weak Urinary Stream - 04/20/2021, - 03/03/2021 Microscopic hematuria, Patient had gross hematuria workup late last year. Will continue to monitor this. - 2022 Primary hypogonadism, This is managed with AndroGel 3 pumps. He will remain on this. - 2021, (Stable), We discussed the fact that his serum testosterone level was found to be above the reference range. He felt that that was the day that he used to of the packets so what I have recommended is he decreased the dosage down to 1 packet daily., - 2019, Hypogonadism, testicular, - 2016 ED due to arterial insufficiency (Worsening), He is going to use sildenafil 20 mg titrating up to 100 mg as needed. - 2019 Unil Inguinal Hernia W/O obst or gang,non-recurrent, Right inguinal hernia - 2015 Elevated PSA, PSA,Elevated - 2014 Peyronies Disease, Peyronie's disease - 2014 Urinary Retention, Unspec, Incomplete bladder emptying - 2014      PMH Notes: Prostate cancer:  Date of diagnosis: 10/29/08  Pretreatment PSA: 5.2  Gleason score: 4+3=7, 5/12 cores positive with only one core positive for Gleason 4+3.  Stage - T1c  Treatment: External Beam Radiation 3-4/10 / Brachytherapy 03/04/09   Nephrolithiasis: In 7/10 he underwent a CT scan because of abdominal complaints and was found to have a 3 mm distal right ureteral calculus but no renal calculi were identified. He subsequently passed right ureteral stone. In 10/10 he underwent left ureteroscopy for what appeared to be a stone overlying the left ureter without progression. The stone was located over the sacral region however at the time of his retrograde pyelogram and ureteroscopy no stone could be identified within the ureter.  CT scan in the 5/20 revealed a right renal  calculi with no left renal stones noted.   Nocturia: He had noted nocturia as well as some daytime frequency and had been treated with a 5 alpha reductase inhibitor as well as an alpha-blocker but remains symptomatic. I tried DDAVP however this resulted in worsening of his hesitancy he did not want to remain on this therapy. I had prescribed anticholinergics in the past but he was reluctant to reinitiate this form of therapy because of his history of ulcerative colitis but once that was under control he elected to undergo a trial of VESIcare at a 10 mg dose which was ineffective. I recommended DDAVP which was started in 5/13.   Hypogonadism: He was found to have hypogonadism with a low free testosterone level of 5.5 in 2/11. Initially I recommended he not undergo testosterone replacement since he had just been diagnosed with prostate cancer. Now that he has shown a significant fall and stabilization of his PSA I therefore initiated testosterone replacement therapy in 5/13.  Current therapy: AndroGel  4 pumps q.a.m.   BPH with outlet obstruction: He developed hematuria with clot retention and eventually voided without difficulty but I placed him on finasteride (5/13) in order to prevent this from occurring in the future.  Current therapy: Tamsulosin and finasteride.   Gross hematuria: He initially experienced gross hematuria in 2/20. A CT scan in 5/20 revealed no abnormality of the upper tract and bladder calculi. Cystoscopy revealed a bulbar urethral stricture.   Bulbar urethral stricture: This was discovered at the time of cystoscopy and treated with balloon dilatation on 03/13/19.   Bilateral renal cysts: He was found have bilateral renal cysts by CT scan in 5/20. There were simple with a Bosniak 68F cyst on the right.   Bladder calculi: He was found to have bladder calculi in underwent cystolitholapaxy on 03/13/19.     NON-GU PMH: Encounter for general adult medical examination without abnormal  findings, Encounter for preventive health examination - 18-Feb-2014 Personal history of other diseases of the circulatory system, History of hypertension - 02/18/2013 Personal history of other endocrine, nutritional and metabolic disease, History of hypercholesterolemia - 2013-02-18 Personal history of other specified conditions, History of heartburn - 18-Feb-2013    FAMILY HISTORY: Death In The Family Father - Father Death In The Family Mother - Mother Family Health Status Number - Runs In Family   SOCIAL HISTORY: Marital Status: Married Preferred Language: English; Ethnicity: Not Hispanic Or Latino; Race: White Current Smoking Status: Patient does not smoke anymore.   Tobacco Use Assessment Completed: Used Tobacco in last 30 days? Does drink.  Drinks 3 caffeinated drinks per day.     Notes: Former smoker, Caffeine Use, Marital History - Currently Married, Alcohol Use, Tobacco Use, Occupation:   REVIEW OF SYSTEMS:    GU Review Male:   Patient denies frequent urination, hard to postpone urination, burning/ pain with urination, get up at night to urinate, leakage of urine, stream starts and stops, trouble starting your stream, have to strain to urinate , erection problems, and penile pain.  Gastrointestinal (Upper):   Patient denies nausea, vomiting, and indigestion/ heartburn.  Gastrointestinal (Lower):   Patient denies diarrhea and constipation.  Constitutional:   Patient denies fever, night sweats, weight loss, and fatigue.  Skin:   Patient denies skin rash/ lesion and itching.  Eyes:   Patient denies double vision and blurred vision.  Ears/ Nose/ Throat:   Patient denies sore throat and sinus problems.  Hematologic/Lymphatic:   Patient denies swollen glands and easy bruising.  Cardiovascular:   Patient denies leg swelling and chest pains.  Respiratory:   Patient denies cough and shortness of breath.  Endocrine:   Patient denies excessive thirst.  Musculoskeletal:   Patient denies back pain and joint pain.   Neurological:   Patient denies headaches and dizziness.  Psychologic:   Patient denies depression and anxiety.   VITAL SIGNS:      01/11/2023 11:10 AM  Weight 136 lb / 61.69 kg  BP 177/69 mmHg  Pulse 80 /min   MULTI-SYSTEM PHYSICAL EXAMINATION:    Constitutional: Well-nourished. No physical deformities. Normally developed. Good grooming.  Neck: Neck symmetrical, not swollen. Normal tracheal position.  Respiratory: No labored breathing, no use of accessory muscles.   Skin: No paleness, no jaundice, no cyanosis. No lesion, no ulcer, no rash.  Neurologic / Psychiatric: Oriented to time, oriented to place, oriented to person. No depression, no anxiety, no agitation.  Eyes: Normal conjunctivae. Normal eyelids.  Ears, Nose, Mouth, and Throat: Left ear no scars,  no lesions, no masses. Right ear no scars, no lesions, no masses. Nose no scars, no lesions, no masses. Normal hearing. Normal lips.  Musculoskeletal: Normal gait and station of head and neck.     Complexity of Data:  Lab Test Review:   BMP  Records Review:   Previous Patient Records, POC Tool  Urine Test Review:   Urinalysis  X-Ray Review: C.T. Abdomen/Pelvis: Reviewed Films. Reviewed Report. Discussed With Patient. Bilateral renal simple cyst. Moderate hydronephrosis to the level of bladder on the right side concerning for obstruction at the UO which has happened before. No ureteral calculi. Patient has known lower pole stone burden on right.    04/06/22 04/13/21 04/21/20 09/29/19 09/23/18 09/13/17 05/30/17 09/18/16  PSA  Total PSA <0.015 ng/mL <0.015 ng/mL <0.015 ng/mL <0.015 ng/mL <0.015 ng/mL 0.017 ng/mL <0.015 ng/dL < 1.610     96/04/54 09/81/19 09/23/18 05/30/17 04/05/14 03/03/14 08/21/13 08/19/12  Hormones  Testosterone, Total 595.4 ng/dL 147.8 ng/dL 2956.2 ng/dL 130.8 ng/dL 657  846  962  952.84     PROCEDURES:         C.T. ABD-Pelv w/o - 74176      Patient confirmed No Neulasta OnPro Device.         Urinalysis  w/Scope Dipstick Dipstick Cont'd Micro  Color: Yellow Bilirubin: Neg mg/dL WBC/hpf: 10 - 13/KGM  Appearance: Cloudy Ketones: Neg mg/dL RBC/hpf: >01/UUV  Specific Gravity: 1.025 Blood: 3+ ery/uL Bacteria: Rare (0-9/hpf)  pH: <=5.0 Protein: 1+ mg/dL Cystals: NS (Not Seen)  Glucose: Neg mg/dL Urobilinogen: 0.2 mg/dL Casts: NS (Not Seen)    Nitrites: Neg Trichomonas: Not Present    Leukocyte Esterase: Trace leu/uL Mucous: Not Present      Epithelial Cells: 0 - 5/hpf      Yeast: NS (Not Seen)      Sperm: Not Present    ASSESSMENT:      ICD-10 Details  1 GU:   Chronic cystitis (with hematuria) - N30.21 Chronic, Stable  2   Gross hematuria - R31.0 Undiagnosed New Problem  3   Urinary Frequency - R35.0 Chronic, Worsening  4   Urinary Urgency - R39.15 Chronic, Worsening   PLAN:           Orders X-Rays: C.T. Abdomen/Pelvis Without I.V. Contrast  X-Ray Notes: History:  Hematuria: Yes/No  Patient to see MD after exam: Yes/No  Previous exam: CT / IVP/ US/ KUB/ None  When:  Where:  Diabetic: Yes/ No  BUN/ Creatinine:  Date of last BUN Creatinine:  Weight in pounds:  Allergy- IV Contrast: Yes/ No  Conflicting diabetic meds: Yes/ No  Diabetic Meds:  Prior Authorization #Burnett [id number] #[id number] Valid 01/11/2023 - 02/09/2023            Schedule         Document Letter(s):  Created for Patient: Clinical Summary         Notes:   Patient with complex urologic history including prostate cancer treated with brachytherapy. He has had multiple extruded brachytherapy seeds resulting in a scarred off right UO as well as bulbar stricture. He has continued hydronephrosis of the right kidney and has required a nephroureteral stent in the past. Most recently he began passing blood clots from unknown source. Noncontrast CT of the abdomen pelvis today did not show any obstructing ureteral calculus but does have moderate right-sided hydronephrosis. Creatinine has improved from  last week back to his baseline of 1.6. His urinary urgency and frequency is significant and he  is voiding every 10 to 15 minutes.   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 Patient understands that he may need to self catheterize following Botox injections based on his urodynamic study that shows a small capacity bladder with poorly contracting voluntary control.   Schedule neck surgery date.

## 2023-01-29 ENCOUNTER — Encounter (HOSPITAL_BASED_OUTPATIENT_CLINIC_OR_DEPARTMENT_OTHER): Payer: Self-pay | Admitting: Urology

## 2023-01-29 ENCOUNTER — Ambulatory Visit (HOSPITAL_BASED_OUTPATIENT_CLINIC_OR_DEPARTMENT_OTHER): Payer: Medicare Other | Admitting: Anesthesiology

## 2023-01-29 ENCOUNTER — Ambulatory Visit (HOSPITAL_BASED_OUTPATIENT_CLINIC_OR_DEPARTMENT_OTHER)
Admission: RE | Admit: 2023-01-29 | Discharge: 2023-01-29 | Disposition: A | Discharge status: Home / Self Care | Payer: Medicare Other | Source: Ambulatory Visit | Attending: Urology | Admitting: Urology

## 2023-01-29 ENCOUNTER — Encounter (HOSPITAL_BASED_OUTPATIENT_CLINIC_OR_DEPARTMENT_OTHER)
Admission: RE | Disposition: A | Discharge status: Home / Self Care | Payer: Self-pay | Source: Ambulatory Visit | Attending: Urology

## 2023-01-29 DIAGNOSIS — N35912 Unspecified bulbous urethral stricture, male: Secondary | ICD-10-CM | POA: Diagnosis not present

## 2023-01-29 DIAGNOSIS — I10 Essential (primary) hypertension: Secondary | ICD-10-CM

## 2023-01-29 DIAGNOSIS — N3281 Overactive bladder: Secondary | ICD-10-CM

## 2023-01-29 DIAGNOSIS — Z8546 Personal history of malignant neoplasm of prostate: Secondary | ICD-10-CM | POA: Insufficient documentation

## 2023-01-29 DIAGNOSIS — N401 Enlarged prostate with lower urinary tract symptoms: Secondary | ICD-10-CM | POA: Diagnosis not present

## 2023-01-29 DIAGNOSIS — D099 Carcinoma in situ, unspecified: Secondary | ICD-10-CM | POA: Diagnosis not present

## 2023-01-29 DIAGNOSIS — R310 Gross hematuria: Secondary | ICD-10-CM | POA: Insufficient documentation

## 2023-01-29 DIAGNOSIS — C675 Malignant neoplasm of bladder neck: Secondary | ICD-10-CM | POA: Insufficient documentation

## 2023-01-29 DIAGNOSIS — N132 Hydronephrosis with renal and ureteral calculous obstruction: Secondary | ICD-10-CM | POA: Diagnosis not present

## 2023-01-29 DIAGNOSIS — D494 Neoplasm of unspecified behavior of bladder: Secondary | ICD-10-CM

## 2023-01-29 DIAGNOSIS — Z01818 Encounter for other preprocedural examination: Secondary | ICD-10-CM

## 2023-01-29 DIAGNOSIS — N3021 Other chronic cystitis with hematuria: Secondary | ICD-10-CM | POA: Insufficient documentation

## 2023-01-29 DIAGNOSIS — Z87891 Personal history of nicotine dependence: Secondary | ICD-10-CM | POA: Insufficient documentation

## 2023-01-29 DIAGNOSIS — D090 Carcinoma in situ of bladder: Secondary | ICD-10-CM | POA: Diagnosis not present

## 2023-01-29 DIAGNOSIS — Z882 Allergy status to sulfonamides status: Secondary | ICD-10-CM | POA: Diagnosis not present

## 2023-01-29 DIAGNOSIS — N281 Cyst of kidney, acquired: Secondary | ICD-10-CM | POA: Diagnosis not present

## 2023-01-29 DIAGNOSIS — Z79899 Other long term (current) drug therapy: Secondary | ICD-10-CM | POA: Insufficient documentation

## 2023-01-29 DIAGNOSIS — N1330 Unspecified hydronephrosis: Secondary | ICD-10-CM

## 2023-01-29 HISTORY — PX: BOTOX INJECTION: SHX5754

## 2023-01-29 HISTORY — PX: TRANSURETHRAL RESECTION OF BLADDER TUMOR: SHX2575

## 2023-01-29 HISTORY — PX: CYSTOSCOPY/URETEROSCOPY/HOLMIUM LASER/STENT PLACEMENT: SHX6546

## 2023-01-29 LAB — POCT I-STAT, CHEM 8
BUN: 32 mg/dL — ABNORMAL HIGH (ref 8–23)
Calcium, Ion: 1.29 mmol/L (ref 1.15–1.40)
Chloride: 107 mmol/L (ref 98–111)
Creatinine, Ser: 2.2 mg/dL — ABNORMAL HIGH (ref 0.61–1.24)
Glucose, Bld: 107 mg/dL — ABNORMAL HIGH (ref 70–99)
HCT: 40 % (ref 39.0–52.0)
Hemoglobin: 13.6 g/dL (ref 13.0–17.0)
Potassium: 4.2 mmol/L (ref 3.5–5.1)
Sodium: 142 mmol/L (ref 135–145)
TCO2: 26 mmol/L (ref 22–32)

## 2023-01-29 SURGERY — BOTOX INJECTION
Anesthesia: General | Site: Urethra

## 2023-01-29 MED ORDER — PROPOFOL 10 MG/ML IV BOLUS
INTRAVENOUS | Status: AC
  Filled 2023-01-29: qty 20

## 2023-01-29 MED ORDER — PROMETHAZINE HCL 25 MG/ML IJ SOLN: 6.2500 mg | INTRAMUSCULAR | Status: DC | PRN

## 2023-01-29 MED ORDER — PROPOFOL 10 MG/ML IV BOLUS
INTRAVENOUS | Status: DC | PRN
  Administered 2023-01-29: 150 mg via INTRAVENOUS

## 2023-01-29 MED ORDER — CIPROFLOXACIN IN D5W 400 MG/200ML IV SOLN
INTRAVENOUS | Status: AC
  Filled 2023-01-29: qty 200

## 2023-01-29 MED ORDER — AMISULPRIDE (ANTIEMETIC) 5 MG/2ML IV SOLN: 10.0000 mg | Freq: Once | INTRAVENOUS | Status: DC | PRN

## 2023-01-29 MED ORDER — ACETAMINOPHEN 500 MG PO TABS
1000.0000 mg | ORAL_TABLET | Freq: Once | ORAL | Status: AC
  Administered 2023-01-29: 1000 mg via ORAL

## 2023-01-29 MED ORDER — FENTANYL CITRATE (PF) 100 MCG/2ML IJ SOLN
INTRAMUSCULAR | Status: AC
  Filled 2023-01-29: qty 2

## 2023-01-29 MED ORDER — ROCURONIUM BROMIDE 10 MG/ML (PF) SYRINGE
PREFILLED_SYRINGE | INTRAVENOUS | Status: AC
  Filled 2023-01-29: qty 10

## 2023-01-29 MED ORDER — ACETAMINOPHEN 500 MG PO TABS
ORAL_TABLET | ORAL | Status: AC
  Filled 2023-01-29: qty 2

## 2023-01-29 MED ORDER — STERILE WATER FOR IRRIGATION IR SOLN
Status: DC | PRN
  Administered 2023-01-29: 20 mL

## 2023-01-29 MED ORDER — LACTATED RINGERS IV SOLN: INTRAVENOUS | Status: DC

## 2023-01-29 MED ORDER — SODIUM CHLORIDE 0.9 % IR SOLN
Status: DC | PRN
  Administered 2023-01-29: 6000 mL via INTRAVESICAL
  Administered 2023-01-29: 3000 mL via INTRAVESICAL
  Administered 2023-01-29: 6000 mL via INTRAVESICAL
  Administered 2023-01-29: 3000 mL via INTRAVESICAL
  Administered 2023-01-29: 6000 mL via INTRAVESICAL

## 2023-01-29 MED ORDER — METHYLENE BLUE 1 % INJ SOLN
INTRAVENOUS | Status: DC | PRN
  Administered 2023-01-29: 100 mg via INTRAVENOUS

## 2023-01-29 MED ORDER — SODIUM CHLORIDE (PF) 0.9 % IJ SOLN
INTRAMUSCULAR | Status: DC | PRN
  Administered 2023-01-29: 10 mL

## 2023-01-29 MED ORDER — DEXAMETHASONE SODIUM PHOSPHATE 10 MG/ML IJ SOLN
INTRAMUSCULAR | Status: DC | PRN
  Administered 2023-01-29: 5 mg via INTRAVENOUS

## 2023-01-29 MED ORDER — OXYCODONE HCL 5 MG PO TABS: 5.0000 mg | ORAL_TABLET | ORAL | 0 refills | Status: DC | PRN

## 2023-01-29 MED ORDER — FENTANYL CITRATE (PF) 100 MCG/2ML IJ SOLN
25.0000 ug | INTRAMUSCULAR | Status: DC | PRN
  Administered 2023-01-29: 50 ug via INTRAVENOUS

## 2023-01-29 MED ORDER — ONDANSETRON HCL 4 MG/2ML IJ SOLN
INTRAMUSCULAR | Status: DC | PRN
  Administered 2023-01-29: 4 mg via INTRAVENOUS

## 2023-01-29 MED ORDER — LIDOCAINE 2% (20 MG/ML) 5 ML SYRINGE
INTRAMUSCULAR | Status: DC | PRN
  Administered 2023-01-29: 60 mg via INTRAVENOUS

## 2023-01-29 MED ORDER — FENTANYL CITRATE (PF) 250 MCG/5ML IJ SOLN
INTRAMUSCULAR | Status: DC | PRN
  Administered 2023-01-29: 50 ug via INTRAVENOUS
  Administered 2023-01-29 (×5): 25 ug via INTRAVENOUS

## 2023-01-29 MED ORDER — CIPROFLOXACIN IN D5W 400 MG/200ML IV SOLN
400.0000 mg | INTRAVENOUS | Status: AC
  Administered 2023-01-29: 400 mg via INTRAVENOUS

## 2023-01-29 MED ORDER — ONABOTULINUMTOXINA 100 UNITS IJ SOLR
INTRAMUSCULAR | Status: DC | PRN
  Administered 2023-01-29: 100 [IU] via INTRAMUSCULAR

## 2023-01-29 SURGICAL SUPPLY — 38 items
BAG DRAIN URO-CYSTO SKYTR STRL (DRAIN) ×4 IMPLANT
BAG DRN RND TRDRP ANRFLXCHMBR (UROLOGICAL SUPPLIES) ×4
BAG DRN UROCATH (DRAIN) ×4
BAG URINE DRAIN 2000ML AR STRL (UROLOGICAL SUPPLIES) ×1 IMPLANT
BASKET ZERO TIP NITINOL 2.4FR (BASKET) IMPLANT
BSKT STON RTRVL ZERO TP 2.4FR (BASKET)
CATH FOLEY 2WAY SLVR  5CC 18FR (CATHETERS) ×4
CATH FOLEY 2WAY SLVR 5CC 18FR (CATHETERS) ×1 IMPLANT
CATH URETL OPEN 5X70 (CATHETERS) ×4 IMPLANT
CLOTH BEACON ORANGE TIMEOUT ST (SAFETY) ×4 IMPLANT
DRSG TEGADERM 4X4.75 (GAUZE/BANDAGES/DRESSINGS) IMPLANT
ELECT REM PT RETURN 9FT ADLT (ELECTROSURGICAL) ×4
ELECTRODE REM PT RTRN 9FT ADLT (ELECTROSURGICAL) ×4 IMPLANT
EXTRACTOR STONE 1.7FRX115CM (UROLOGICAL SUPPLIES) IMPLANT
GLOVE BIO SURGEON STRL SZ 6.5 (GLOVE) ×4 IMPLANT
GOWN STRL REUS W/TWL LRG LVL3 (GOWN DISPOSABLE) ×4 IMPLANT
GUIDEWIRE STR DUAL SENSOR (WIRE) ×5 IMPLANT
IV NS IRRIG 3000ML ARTHROMATIC (IV SOLUTION) ×11 IMPLANT
KIT TURNOVER CYSTO (KITS) ×4 IMPLANT
LASER FIB FLEXIVA PULSE ID 365 (Laser) IMPLANT
LOOP CUT BIPOLAR 24F LRG (ELECTROSURGICAL) ×1 IMPLANT
MANIFOLD NEPTUNE II (INSTRUMENTS) ×4 IMPLANT
NDL ASPIRATION 22 (NEEDLE) ×3 IMPLANT
NDL SAFETY ECLIP 18X1.5 (MISCELLANEOUS) ×4 IMPLANT
NEEDLE ASPIRATION 22 (NEEDLE) ×4 IMPLANT
PACK CYSTO (CUSTOM PROCEDURE TRAY) ×4 IMPLANT
SLEEVE SCD COMPRESS KNEE MED (STOCKING) ×4 IMPLANT
STENT URET 6FRX26 CONTOUR (STENTS) ×1 IMPLANT
SYR 20ML LL LF (SYRINGE) ×4 IMPLANT
SYR CONTROL 10ML LL (SYRINGE) ×5 IMPLANT
SYR TOOMEY IRRIG 70ML (MISCELLANEOUS) ×4
SYRINGE TOOMEY IRRIG 70ML (MISCELLANEOUS) ×1 IMPLANT
TRACTIP FLEXIVA PULS ID 200XHI (Laser) IMPLANT
TRACTIP FLEXIVA PULSE ID 200 (Laser)
TUBE CONNECTING 12X1/4 (SUCTIONS) ×4 IMPLANT
TUBING UROLOGY SET (TUBING) ×4 IMPLANT
WATER STERILE IRR 3000ML UROMA (IV SOLUTION) ×4 IMPLANT
WATER STERILE IRR 500ML POUR (IV SOLUTION) ×1 IMPLANT

## 2023-01-29 NOTE — Transfer of Care (Signed)
Immediate Anesthesia Transfer of Care Note  Patient: Wesley [name]  Procedure(s) Performed: BOTOX INJECTION 100 UNITS (Bladder) DIAGNOSTIC CYSTOSCOPY , LEFT RETROGRADE PYELOGRAM, LEFT URETERAL STENT PLACEMENT (Bilateral: Pelvis) TRANSURETHRAL RESECTION OF BLADDER TUMOR (TURBT) (Urethra)  Patient Location: PACU  Anesthesia Type:General  Level of Consciousness: drowsy  Airway & Oxygen Therapy: Patient Spontanous Breathing and Patient connected to face mask oxygen  Post-op Assessment: Report given to RN and Post -op Vital signs reviewed and stable  Post vital signs: Reviewed and stable  Last Vitals:  Vitals Value Taken Time  BP 178/163 01/29/23 1116  Temp    Pulse 80 01/29/23 1117  Resp 19 01/29/23 1117  SpO2 100 % 01/29/23 1117  Vitals shown include unvalidated device data.  Last Pain:  Vitals:   01/29/23 0800  TempSrc: Oral  PainSc: 0-No pain      Patients Stated Pain Goal: 5 (01/29/23 0800)  Complications: No notable events documented.

## 2023-01-29 NOTE — Anesthesia Procedure Notes (Signed)
Procedure Name: LMA Insertion Date/Time: 01/29/2023 9:38 AM  Performed by: Dairl Ponder, CRNAPre-anesthesia Checklist: Patient identified, Emergency Drugs available, Suction available and Patient being monitored Patient Re-evaluated:Patient Re-evaluated prior to induction Oxygen Delivery Method: Circle System Utilized Preoxygenation: Pre-oxygenation with 100% oxygen Induction Type: IV induction Ventilation: Mask ventilation without difficulty LMA: LMA inserted LMA Size: 4.0 Number of attempts: 1 Placement Confirmation: positive ETCO2 Tube secured with: Tape Dental Injury: Teeth and Oropharynx as per pre-operative assessment

## 2023-01-29 NOTE — Interval H&P Note (Signed)
History and Physical Interval Note:  01/29/2023 8:44 AM  Wesley [name]  has presented today for surgery, with the diagnosis of OVERACTIVE BLADDER  GROSS HEMATURIA.  The various methods of treatment have been discussed with the patient and family. After consideration of risks, benefits and other options for treatment, the patient has consented to  Procedure(s) with comments: DIAGNOSTIC CYSTOSCOPY WITH FULGERATION (N/A) - 75 MINUTES BOTOX INJECTION 100 UNITS (N/A) DIAGNOSTIC CYSTOSCOPY BILATERAL URETEROSCOPY, BILATERAL RETROGRADE PYELOGRAM,  HOLMIUM LASER LITHOTRIPSY, BILATERAL URETERAL STENT PLACEMENT (Bilateral) as a surgical intervention.  The patient's history has been reviewed, patient examined, no change in status, stable for surgery.  I have reviewed the patient's chart and labs.  Questions were answered to the patient's satisfaction.     Kewanna Kasprzak D Kurtiss Wence

## 2023-01-29 NOTE — Op Note (Signed)
Operative Note  Preoperative diagnosis:  1.  Gross hematuria 2.  Overactive bladder 3.  Hydronephrosis  Postoperative diagnosis: 1.  Gross hematuria 2.  Overactive bladder 3.  Hydronephrosis  4.  Extruded brachytherapy seeds/necotic bladder neck  Procedure(s): 1.  Diagnostic cystoscopy 2.  Left retrograde pyelgoram 3.  Left 6Fr x 26cm - ureteral stent no tether 4.  TURBT 3cm  Surgeon: Kasandra Knudsen, MD  Assistants:  None  Anesthesia:  General  Complications:  None  Findings: Fixed, prostatic urethra with necrotic exudate/stone adherent to bilateral lateral lobes Papillary bladder mucosa at bladder neck/trigone concerning for malignancy vs radiation changes Extruded brachytherapy seed in prostatic urethra Unable to visualize ureteral orifices. At end of resection, left UO visualize and stent in place   EBL:  none  Specimens: 1. Bladder neck tumor  Drains/Catheters: 1.  6Fr x 24cm left ureteral stent - no tether 2. 18Fr urethral foley    Indication:  Wesley [name] is a 72 y.o. male with a complicated urologic history including prostate cancer treated with brachytherapy, right hydronephrosis, urolithiasis who most recently developed intermittent gross hematuria.  He also has known overactive bladder with urodynamics study confirming severe urgency, urge incontinence and small bladder capacity.  He is here today for diagnostic cystoscopy, injection of intravesical Botox, possible bilateral diagnostic ureteroscopy, possible bilateral ureteral stent placement.  Description of procedure:  After risks and benefits of the procedure discussed with the patient, informed consent was obtained.  The patient was taken the operating placed in supine position.  Anesthesia was induced and antibiotics were administered.  The patient was then repositioned in the dorsolithotomy position.  He was prepped and draped in usual sterile fashion timeout performed.  A 23 French rigid  cystoscope was placed in the meatus and advanced into the bladder and direct visualization.  Patient was noted to have a fixed prostatic urethra with necrotic exudate/stone adherent to the bilateral lateral lobes.  There was papillary growth seen at the bladder neck obscuring view/replacing normal bladder mucosa.  The trigone was not visible and was replaced by this abnormal appearing mucosa.  I could not locate either ureteral orifice.  Further inspection of bladder mucosa revealed no other bladder lesions on the posterior superior or lateral walls.  The cystoscope was removed and the resectoscope assembled using the visual obturator.  This was advanced into the bladder under direct visualization.  The visual obturator was removed and the bipolar electrocautery loop was attached.  The necrotic exudate on the lateral lobes of the prostate were removed with bipolar electrocautery.  There seem to be calcification adherent to this.  In addition there is an extruded brachytherapy seed seen in the prostatic urethra which has been seen many times before.  I was unable to visualize either ureteral orifice and began resecting the abnormal bladder mucosa.  The left ureteral orifice then became visible however I had already resected it.  The resectoscope was removed and the cystoscope assembled.  I was able to cannulate the left ureteral orifice with a sensor wire.  The wire was advanced to the kidney with fluoroscopy.  An open-ended ureteral catheter was then placed over the wire and the wire removed.  A retrograde pyelogram was obtained which showed moderate hydronephrosis.  The wire was replaced back through the open ureteral catheter and the ureteral catheter removed.  Next a 6 [place] by 26 cm double-J ureteral stent on the left-hand side was placed without difficulty.  Attention then turned to the area that should  be the trigone.  Several attempts at trying to locate the right ureteral orifice was not successful.   Methylene blue was administered intravenously and was also not seen in the bladder.  The decision was made to proceed with the Botox portion of the case.  The cystoscope was removed and the injection scope was replaced.  100 units of Botox mixed in 10 cc of sterile injectable saline were then delivered in posterior template.  Care was taken to avoid the trigone given the abnormality.  The injection scope was removed and an 7 French Foley catheter was then placed without difficulty.  20 cc of sterile water were placed in the balloon.  The patient emerged from anesthesia and was transferred the PACU in stable condition.  Plan:  Await bladder tumor pathology. Plan for Right nephrostomy vs NUS/JJ by IR.

## 2023-01-29 NOTE — Discharge Instructions (Addendum)
Transurethral Resection of Bladder Tumor (TURBT)   Definition:  Transurethral Resection of the Bladder Tumor is a surgical procedure used to diagnose and remove tumors within the bladder. TURBT is the most common treatment for early stage bladder cancer.  General instructions:     Your recent bladder surgery requires very little post hospital care but some definite precautions.  Despite the fact that no skin incisions were used, the area around the bladder incisions are raw and covered with scabs to promote healing and prevent bleeding. Certain precautions are needed to insure that the scabs are not disturbed over the next 2-4 weeks while the healing proceeds.  Because the raw surface inside your bladder and the irritating effects of urine you may expect frequency of urination and/or urgency (a stronger desire to urinate) and perhaps even getting up at night more often. This will usually resolve or improve slowly over the healing period. You may see some blood in your urine over the first 6 weeks. Do not be alarmed, even if the urine was clear for a while. Get off your feet and drink lots of fluids until clearing occurs. If you start to pass clots or don't improve call us.  Catheter: (If you are discharged with a catheter.)  1. Keep your catheter secured to your leg at all times with tape or the supplied strap. 2. You may experience leakage of urine around your catheter- as long as the  catheter continues to drain, this is normal.  If your catheter stops draining  go to the ER. 3. You may also have blood in your urine, even after it has been clear for  several days; you may even pass some small blood clots or other material.  This  is normal as well.  If this happens, sit down and drink plenty of water to help  make urine to flush out your bladder.  If the blood in your urine becomes worse  after doing this, contact our office or return to the ER. 4. You may use the leg bag (small bag)  during the day, but use the large bag at  night.  Diet:  You may return to your normal diet immediately. Because of the raw surface of your bladder, alcohol, spicy foods, foods high in acid and drinks with caffeine may cause irritation or frequency and should be used in moderation. To keep your urine flowing freely and avoid constipation, drink plenty of fluids during the day (8-10 glasses). Tip: Avoid cranberry juice because it is very acidic.  Activity:  Your physical activity doesn't need to be restricted. However, if you are very active, you may see some blood in the urine. We suggest that you reduce your activity under the circumstances until the bleeding has stopped.  Bowels:  It is important to keep your bowels regular during the postoperative period. Straining with bowel movements can cause bleeding. A bowel movement every other day is reasonable. Use a mild laxative if needed, such as milk of magnesia 2-3 tablespoons, or 2 Dulcolax tablets. Call if you continue to have problems. If you had been taking narcotics for pain, before, during or after your surgery, you may be constipated. Take a laxative if necessary.    Medication:  You should resume your pre-surgery medications unless told not to. In addition you may be given an antibiotic to prevent or treat infection. Antibiotics are not always necessary. All medication should be taken as prescribed until the bottles are finished unless you are having   an unusual reaction to one of the drugs.    Post stent placement instructions   Definitions:  Ureter: The duct that transports urine from the kidney to the bladder. Stent: A plastic hollow tube that is placed into the ureter, from the kidney to the bladder to prevent the ureter from swelling shut.  General instructions:  Despite the fact that no skin incisions were used, the area around the ureter and bladder is raw and irritated. The stent is a foreign body which can further  irritate the bladder wall. This irritation is manifested by increased frequency of urination, both day and night, and by an increase in the urge to urinate. In some, the urge to urinate is present almost always. Sometimes the urge is strong enough that you may not be able to stop your self from urinating. This can often be controlled with medication but does not occur in everyone. A stent can safely be left in place for 3 months or greater.  You may see some blood in your urine while the stent is in place and a few days afterward. Do not be alarmed, even if the urine is clear for a while. Get off your feet and drink lots of fluids until clearing occurs. If you start to pass clots or don't improve, call us.  Diet:  You may return to your normal diet immediately. Because of the raw surface of your bladder, alcohol, spicy foods, foods high in acid and drinks with caffeine may cause irritation or frequency and should be used in moderation. To keep your urine flowing freely and avoid constipation, drink plenty of fluids during the day (8-10 glasses). Tip: Avoid cranberry juice because it is very acidic.  Activity:  Your physical activity doesn't need to be restricted. However, if you are very active, you may see some blood in the urine. We suggest that you reduce your activity under the circumstances until the bleeding has stopped.  Bowels:  It is important to keep your bowels regular during the postoperative period. Straining with bowel movements can cause bleeding. A bowel movement every other day is reasonable. Use a mild laxative if needed, such as milk of magnesia 2-3 tablespoons, or 2 Dulcolax tablets. Call if you continue to have problems. If you had been taking narcotics for pain, before, during or after your surgery, you may be constipated. Take a laxative if necessary.  Medication:  You should resume your pre-surgery medications unless told not to. In addition you may be given an antibiotic to  prevent or treat infection. Antibiotics are not always necessary. All medication should be taken as prescribed until the bottles are finished unless you are having an unusual reaction to one of the drugs.  Problems you should report to [place]:  a. Fever greater than 101F. b. Heavy bleeding, or clots (see notes above about blood in urine). c. Inability to urinate. d. Drug reactions (hives, rash, nausea, vomiting, diarrhea). e. Severe burning or pain with urination that is not improving.   Post Anesthesia Home Care Instructions  Activity: Get plenty of rest for the remainder of the day. A responsible individual must stay with you for 24 hours following the procedure.  For the next 24 hours, DO NOT: -Drive a car -Advertising copywriter -Drink alcoholic beverages -Take any medication unless instructed by your physician -Make any legal decisions or sign important papers.  Meals: Start with liquid foods such as gelatin or soup. Progress to regular foods as tolerated. Avoid greasy, spicy, heavy foods.  If nausea and/or vomiting occur, drink only clear liquids until the nausea and/or vomiting subsides. Call your physician if vomiting continues.  Special Instructions/Symptoms: Your throat may feel dry or sore from the anesthesia or the breathing tube placed in your throat during surgery. If this causes discomfort, gargle with warm salt water. The discomfort should disappear within 24 hours.

## 2023-01-29 NOTE — Anesthesia Postprocedure Evaluation (Signed)
Anesthesia Post Note  Patient: Wesley [name]  Procedure(s) Performed: BOTOX INJECTION 100 UNITS (Bladder) DIAGNOSTIC CYSTOSCOPY , LEFT RETROGRADE PYELOGRAM, LEFT URETERAL STENT PLACEMENT (Bilateral: Pelvis) TRANSURETHRAL RESECTION OF BLADDER TUMOR (TURBT) (Urethra)     Patient location during evaluation: PACU Anesthesia Type: General Level of consciousness: sedated Pain management: pain level controlled Vital Signs Assessment: post-procedure vital signs reviewed and stable Respiratory status: spontaneous breathing and respiratory function stable Cardiovascular status: stable Postop Assessment: no apparent nausea or vomiting Anesthetic complications: no   No notable events documented.  Last Vitals:  Vitals:   01/29/23 1106 01/29/23 1118  BP: 95/70 (!) 156/78  Pulse: 88 82  Resp: (!) 28   Temp: (!) 36.2 C   SpO2: 90%     Last Pain:  Vitals:   01/29/23 1140  TempSrc:   PainSc: 0-No pain                 Melinda Pottinger DANIEL

## 2023-01-29 NOTE — Anesthesia Preprocedure Evaluation (Addendum)
Anesthesia Evaluation  Patient identified by MRN, date of birth, ID band Patient awake    Reviewed: Allergy & Precautions, NPO status , Patient's Chart, lab work & pertinent test results  History of Anesthesia Complications (+) history of anesthetic complications  Airway Mallampati: II  TM Distance: >3 FB Neck ROM: Full    Dental no notable dental hx. (+) Dental Advisory Given   Pulmonary former smoker   Pulmonary exam normal        Cardiovascular hypertension, Pt. on medications Normal cardiovascular exam     Neuro/Psych  Headaches, Seizures - (last seizure 2 months ago),  Hx of resection of meningioma  negative psych ROS   GI/Hepatic Neg liver ROS, hiatal hernia (s/p lap paraesophageal hernia repair w/ nissen fundoplication), PUD,GERD  ,,UC   Endo/Other  negative endocrine ROS    Renal/GU Renal InsufficiencyRenal disease (HYDRONEPHROSIS, RENAL CALCULUS) Bladder dysfunction      Musculoskeletal  (+) Arthritis ,    Abdominal   Peds  Hematology negative hematology ROS (+)   Anesthesia Other Findings Day of surgery medications reviewed with the patient.  Reproductive/Obstetrics                             Anesthesia Physical Anesthesia Plan  ASA: 3  Anesthesia Plan: General   Post-op Pain Management: Tylenol PO (pre-op)*   Induction: Intravenous  PONV Risk Score and Plan: 3 and Dexamethasone and Ondansetron  Airway Management Planned: LMA  Additional Equipment:   Intra-op Plan:   Post-operative Plan: Extubation in OR  Informed Consent: I have reviewed the patients History and Physical, chart, labs and discussed the procedure including the risks, benefits and alternatives for the proposed anesthesia with the patient or authorized representative who has indicated his/her understanding and acceptance.     Dental advisory given  Plan Discussed with: Anesthesiologist and  CRNA  Anesthesia Plan Comments:         Anesthesia Quick Evaluation

## 2023-01-30 ENCOUNTER — Other Ambulatory Visit (HOSPITAL_COMMUNITY): Payer: Self-pay | Admitting: Urology

## 2023-01-30 DIAGNOSIS — N1330 Unspecified hydronephrosis: Secondary | ICD-10-CM

## 2023-01-31 ENCOUNTER — Encounter (HOSPITAL_BASED_OUTPATIENT_CLINIC_OR_DEPARTMENT_OTHER): Payer: Self-pay | Admitting: Urology

## 2023-01-31 LAB — SURGICAL PATHOLOGY

## 2023-02-01 ENCOUNTER — Other Ambulatory Visit: Payer: Self-pay | Admitting: Radiology

## 2023-02-01 DIAGNOSIS — N130 Hydronephrosis with ureteropelvic junction obstruction: Secondary | ICD-10-CM | POA: Diagnosis not present

## 2023-02-01 DIAGNOSIS — C670 Malignant neoplasm of trigone of bladder: Secondary | ICD-10-CM | POA: Diagnosis not present

## 2023-02-01 DIAGNOSIS — N1330 Unspecified hydronephrosis: Secondary | ICD-10-CM

## 2023-02-04 ENCOUNTER — Ambulatory Visit (HOSPITAL_COMMUNITY)
Admission: RE | Admit: 2023-02-04 | Discharge: 2023-02-04 | Disposition: A | Discharge status: Home / Self Care | Payer: Medicare Other | Source: Ambulatory Visit | Attending: Urology | Admitting: Urology

## 2023-02-04 ENCOUNTER — Other Ambulatory Visit: Payer: Self-pay

## 2023-02-04 ENCOUNTER — Encounter (HOSPITAL_COMMUNITY): Payer: Self-pay

## 2023-02-04 DIAGNOSIS — N200 Calculus of kidney: Secondary | ICD-10-CM | POA: Diagnosis not present

## 2023-02-04 DIAGNOSIS — G40209 Localization-related (focal) (partial) symptomatic epilepsy and epileptic syndromes with complex partial seizures, not intractable, without status epilepticus: Secondary | ICD-10-CM | POA: Insufficient documentation

## 2023-02-04 DIAGNOSIS — K219 Gastro-esophageal reflux disease without esophagitis: Secondary | ICD-10-CM | POA: Diagnosis not present

## 2023-02-04 DIAGNOSIS — E785 Hyperlipidemia, unspecified: Secondary | ICD-10-CM | POA: Insufficient documentation

## 2023-02-04 DIAGNOSIS — N130 Hydronephrosis with ureteropelvic junction obstruction: Secondary | ICD-10-CM | POA: Diagnosis not present

## 2023-02-04 DIAGNOSIS — N1330 Unspecified hydronephrosis: Secondary | ICD-10-CM

## 2023-02-04 DIAGNOSIS — Z8546 Personal history of malignant neoplasm of prostate: Secondary | ICD-10-CM | POA: Diagnosis not present

## 2023-02-04 DIAGNOSIS — C61 Malignant neoplasm of prostate: Secondary | ICD-10-CM | POA: Diagnosis not present

## 2023-02-04 DIAGNOSIS — I10 Essential (primary) hypertension: Secondary | ICD-10-CM | POA: Diagnosis not present

## 2023-02-04 DIAGNOSIS — C670 Malignant neoplasm of trigone of bladder: Secondary | ICD-10-CM | POA: Diagnosis not present

## 2023-02-04 DIAGNOSIS — Z466 Encounter for fitting and adjustment of urinary device: Secondary | ICD-10-CM | POA: Diagnosis not present

## 2023-02-04 HISTORY — PX: IR URETERAL STENT RIGHT NEW ACCESS W/O SEP NEPHROSTOMY CATH: IMG6076

## 2023-02-04 LAB — BASIC METABOLIC PANEL
Anion gap: 10 (ref 5–15)
BUN: 38 mg/dL — ABNORMAL HIGH (ref 8–23)
CO2: 24 mmol/L (ref 22–32)
Calcium: 8.8 mg/dL — ABNORMAL LOW (ref 8.9–10.3)
Chloride: 104 mmol/L (ref 98–111)
Creatinine, Ser: 2.21 mg/dL — ABNORMAL HIGH (ref 0.61–1.24)
GFR, Estimated: 31 mL/min — ABNORMAL LOW (ref >60)
Glucose, Bld: 116 mg/dL — ABNORMAL HIGH (ref 70–99)
Potassium: 3.9 mmol/L (ref 3.5–5.1)
Sodium: 138 mmol/L (ref 135–145)

## 2023-02-04 LAB — CBC WITH DIFFERENTIAL/PLATELET
Abs Immature Granulocytes: 0.02 10*3/uL (ref 0.00–0.07)
Basophils Absolute: 0.1 10*3/uL (ref 0.0–0.1)
Basophils Relative: 1 %
Eosinophils Absolute: 0.2 10*3/uL (ref 0.0–0.5)
Eosinophils Relative: 3 %
HCT: 39.9 % (ref 39.0–52.0)
Hemoglobin: 13.5 g/dL (ref 13.0–17.0)
Immature Granulocytes: 0 %
Lymphocytes Relative: 13 %
Lymphs Abs: 0.9 10*3/uL (ref 0.7–4.0)
MCH: 32 pg (ref 26.0–34.0)
MCHC: 33.8 g/dL (ref 30.0–36.0)
MCV: 94.5 fL (ref 80.0–100.0)
Monocytes Absolute: 0.8 10*3/uL (ref 0.1–1.0)
Monocytes Relative: 11 %
Neutro Abs: 4.9 10*3/uL (ref 1.7–7.7)
Neutrophils Relative %: 72 %
Platelets: 222 10*3/uL (ref 150–400)
RBC: 4.22 MIL/uL (ref 4.22–5.81)
RDW: 12.5 % (ref 11.5–15.5)
WBC: 6.9 10*3/uL (ref 4.0–10.5)
nRBC: 0 % (ref 0.0–0.2)

## 2023-02-04 LAB — PROTIME-INR
INR: 1 (ref 0.8–1.2)
Prothrombin Time: 13.2 seconds (ref 11.4–15.2)

## 2023-02-04 MED ORDER — MIDAZOLAM HCL 2 MG/2ML IJ SOLN
INTRAMUSCULAR | Status: AC | PRN
  Administered 2023-02-04 (×2): 1 mg via INTRAVENOUS

## 2023-02-04 MED ORDER — MIDAZOLAM HCL 2 MG/2ML IJ SOLN
INTRAMUSCULAR | Status: AC
  Filled 2023-02-04: qty 2

## 2023-02-04 MED ORDER — FENTANYL CITRATE (PF) 100 MCG/2ML IJ SOLN
INTRAMUSCULAR | Status: AC | PRN
  Administered 2023-02-04 (×2): 50 ug via INTRAVENOUS

## 2023-02-04 MED ORDER — SODIUM CHLORIDE 0.9 % IV SOLN
2.0000 g | Freq: Once | INTRAVENOUS | Status: AC
  Administered 2023-02-04: 2 g via INTRAVENOUS
  Filled 2023-02-04: qty 20

## 2023-02-04 MED ORDER — FENTANYL CITRATE (PF) 100 MCG/2ML IJ SOLN
INTRAMUSCULAR | Status: AC
  Filled 2023-02-04: qty 2

## 2023-02-04 MED ORDER — LIDOCAINE HCL 1 % IJ SOLN
20.0000 mL | Freq: Once | INTRAMUSCULAR | Status: AC
  Administered 2023-02-04: 6 mL via INTRADERMAL

## 2023-02-04 MED ORDER — SODIUM CHLORIDE 0.9 % IV SOLN: INTRAVENOUS | Status: DC

## 2023-02-04 MED ORDER — LIDOCAINE HCL 1 % IJ SOLN
INTRAMUSCULAR | Status: AC
  Filled 2023-02-04: qty 20

## 2023-02-04 MED ORDER — IOHEXOL 300 MG/ML  SOLN: 50.0000 mL | Freq: Once | INTRAMUSCULAR | Status: DC | PRN

## 2023-02-04 NOTE — Progress Notes (Signed)
Patient noted to come back from IR with drainage bag attached to PCN tube. No orders for flush/bag maintenance. Call to Dr. Miles Costain for orders/POC. Nursing communication order placed for 10cc flush daily of normal saline until the line is clear. Patient taught how to flush tubing. Three way stop cock placed and lumen for cleanliness and patient ease of care. Patient explained importance of maintenance, keeping line patent (neutral position of stop cock to ensure proper drainage from kidney to bag) patient aware on how to flush towards bag to clear line to ensure patency of device.  Supplies provided to patient (NS flush, alcohol, extra lumen, surgical towels) Patient aware on the importance of hand hygiene and a clean/sterile environment for flushing.

## 2023-02-04 NOTE — Procedures (Signed)
Interventional Radiology Procedure Note  Procedure: RT 10 FR PCN    Complications: None  Estimated Blood Loss:  MIN  Findings:  DISTAL UVJ OBSTRUCTION, feels firm with the wire, unable to cross Some bleeding encountered 21fr pcn placed Can retry in 2 weeks but doubtful we will be able to cross     M. Ruel Favors, MD

## 2023-02-04 NOTE — Discharge Instructions (Addendum)
Please call Interventional Radiology clinic [phone] with any questions or concerns.  You may remove your dressing and shower tomorrow.  Change your dressing daily. Do not use scissors near the tube (catheter).  Flush tubing to bag once daily with 10 mL normal saline flush, until drainage runs clear. Keep your follow up with Dr. Arita Miss to discuss progression of care.  Moderate Conscious Sedation, Adult, Care After This sheet gives you information about how to care for yourself after your procedure. Your health care provider may also give you more specific instructions. If you have problems or questions, contact your health careprovider. What can I expect after the procedure? After the procedure, it is common to have: Sleepiness for several hours. Impaired judgment for several hours. Difficulty with balance. Vomiting if you eat too soon. Follow these instructions at home: For the time period you were told by your health care provider: Rest. Do not participate in activities where you could fall or become injured. Do not drive or use machinery. Do not drink alcohol. Do not take sleeping pills or medicines that cause drowsiness. Do not make important decisions or sign legal documents. Do not take care of children on your own. Eating and drinking  Follow the diet recommended by your health care provider. Drink enough fluid to keep your urine pale yellow. If you vomit: Drink water, juice, or soup when you can drink without vomiting. Make sure you have little or no nausea before eating solid foods.  General instructions Take over-the-counter and prescription medicines only as told by your health care provider. Have a responsible adult stay with you for the time you are told. It is important to have someone help care for you until you are awake and alert. Do not smoke. Keep all follow-up visits as told by your health care provider. This is important. Contact a health care provider  if: You are still sleepy or having trouble with balance after 24 hours. You feel light-headed. You keep feeling nauseous or you keep vomiting. You develop a rash. You have a fever. You have redness or swelling around the IV site. Get help right away if: You have trouble breathing. You have new-onset confusion at home. Summary After the procedure, it is common to feel sleepy, have impaired judgment, or feel nauseous if you eat too soon. Rest after you get home. Know the things you should not do after the procedure. Follow the diet recommended by your health care provider and drink enough fluid to keep your urine pale yellow. Get help right away if you have trouble breathing or new-onset confusion at home. This information is not intended to replace advice given to you by your health care provider. Make sure you discuss any questions you have with your healthcare provider. Document Revised: 01/22/2020 Document Reviewed: 08/20/2019 Elsevier Patient Education  2022 [employer]   Percutaneous Nephrostomy, Care After This sheet gives you information about how to care for yourself after your procedure. Your health care provider may also give you more specific instructions. If you have problems or questions, contact your health care provider. What can I expect after the procedure? After the procedure, it is common to have: Some soreness where the nephrostomy tube was inserted (tube insertion site). Blood-tinged drainage from the nephrostomy tube for the first 24 hours. Follow these instructions at home: Activity  Do not lift anything that is heavier than 10 lb (4.5 kg), or the limit that you are told, until your health care provider says that it  is safe. Return to your normal activities as told by your health care provider. Ask your health care provider what activities are safe for you. Avoid activities that may cause the nephrostomy tubing to bend. Do not take baths, swim, or use a hot  tub until your health care provider approves. Ask your health care provider if you can take showers. Cover the nephrostomy tube bandage (dressing) with a watertight covering when you take a shower. If you were given a sedative during the procedure, it can affect you for several hours. Do not drive or operate machinery until your health care provider says that it is safe. Care of the tube insertion site  Follow instructions from your health care provider about how to take care of your tube insertion site. Make sure you: Wash your hands with soap and water for at least 20 seconds before you change your dressing. If soap and water are not available, use hand sanitizer. Change your dressing as told by your health care provider. Be careful not to pull on the tube while removing the dressing. When you change the dressing, wash the skin around the tube, rinse well, and pat the skin dry. Check the tube insertion area every day for signs of infection. Check for: Redness, swelling, or pain. Fluid or blood. Warmth. Pus or a bad smell. Care of the nephrostomy tube and drainage bag Always keep the tubing, the leg bag, or the bedside drainage bags below the level of the kidney so that your urine drains freely. When connecting your nephrostomy tube to a drainage bag, make sure that there are no kinks in the tubing and that your urine is draining freely. You may want to use an elastic bandage to wrap any exposed tubing that goes from the nephrostomy tube to any of the connecting tubes. At night, you may want to connect your nephrostomy tube or the leg bag to a larger bedside drainage bag. Follow instructions from your health care provider about how to empty or change the drainage bag. Empty the drainage bag when it becomes ? full. Replace the drainage bag and any extension tubing that is connected to your nephrostomy tube every 7 days or as told by your health care provider. Your health care provider will explain  how to change the drainage bag and extension tubing. General instructions Take over-the-counter and prescription medicines only as told by your health care provider. Keep all follow-up visits as told by your health care provider. This is important. The nephrostomy tube will need to be changed every 8-12 weeks. Contact a health care provider if: You have problems with any of the valves or tubing. You have persistent pain or soreness in your back. You have redness, swelling, or pain around your tube insertion site. You have fluid or blood coming from your tube insertion site. Your tube insertion site feels warm to the touch. You have pus or a bad smell coming from your tube insertion site. You have increased urine output or you feel burning when urinating. Get help right away if: You have pain in your abdomen during the first week. You have chest pain or have trouble breathing. You have a new appearance of blood in your urine. You have a fever or chills. You have back pain that is not relieved by your medicine. You have decreased urine output. Your nephrostomy tube comes out. Summary After the procedure, it is common to have some soreness where the nephrostomy tube was inserted (tube insertion site). Follow  instructions from your health care provider about how to take care of your tube insertion site, nephrostomy tube, and drainage bag. Keep all follow-up visits for care and for changing the tube. This information is not intended to replace advice given to you by your health care provider. Make sure you discuss any questions you have with your health care provider.

## 2023-02-04 NOTE — H&P (Signed)
Referring Physician(s): Pace,Maryellen D  Supervising Physician: Ruel Favors  Patient Status:  [facility]  Chief Complaint:  "I'm getting a right kidney tube put in"  Subjective: Pt known to IR team from right PCN on 07/18/22. He has a PMH sig for arthritis, BPH,  remote prostate cancer with prior seed implantation/ radiation therapy, complex partial seizures, HLD, GERD, skin cancer, meningioma resection 2010, HTN, UC, migraines, overactive bladder, renal cysts and nephrolithiasis. He also has a hx of hematuria and underwent cysto with left ureteral stent  placement and TURBT on 01/29/23 with pathology revealing high grade urothelial carcinoma. The right ureteral orifice could not be visualized. He has elevated creatinine and rt hydronephrosis. He is scheduled today for right percutaneous nephrostomy/nephroureteral cath placement. He currently denies fever, HA,CP,dyspnea, cough, abd/back pain,N/V.    Past Medical History:  Diagnosis Date   Arthritis    Atypical chest pain    cardiologist-- dr Leeann Must;  chronic occasional,  pressure / discomfort, LOV 07/14/21 (as of 06/08/22)   Bladder calculus    BPH with obstruction/lower urinary tract symptoms    Follows with Dr. Kasandra Knudsen, urologist.   Complex partial seizure [facility])    followed by neurologist--- dr d. 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 Daphane Shepherd at [facility] neurology, LOV w/ FNP on 03/21/22 in Epic ( as of 06/08/22)   Generalized abdominal pain    w/  bloating due to UC  take doxepin   GERD (gastroesophageal reflux disease)    H/O urinary retention    History of bladder stone    History of hiatal hernia    History of kidney stones    History of prostate cancer 2010   urologist--- dr pace;   dx 2010,  Gleason 4+3, PSA 5.2;    03-04-2009  s/p radiactive prostate seed implants and external beam radiation   History of repair of hiatal hernia    02-11-2017  s/p lap paraesophageal hernia repair w/ nissen fundoplication   History of skin cancer    Hx of resection of meningioma 02/11/2009   benign right temperal lobe meningioma s/p resection  (residual hearing loss)   (neurosurgeon-- dr Tyrone Sage)   Hypertension    followed by cardiologist and pcp   Insomnia    Migraine with aura and without status migrainosus, not intractable    neurologist-- dr Marlena Clipper   Prostate cancer [facility])    Simple renal cyst    bilateral ;   followed by urologist   Ulcerative colitis (HCC)    followed by dr v. Rhetta Mura (GI);  dx 2010;  current treatment stelara q8wks, LOV 06/07/22 (as of 06/08/22)   Wears glasses    Past Surgical History:  Procedure Laterality Date   BOTOX INJECTION N/A 01/29/2023   Procedure: BOTOX INJECTION 100 UNITS;  Surgeon: Noel Christmas, MD;  Location: [facility];  Service: Urology;  Laterality: N/A;   BRAIN MENINGIOMA EXCISION  02/11/2009   @[facility] ;  craniotomy , right temporal medial sphonoid wing resection   CARPAL TUNNEL RELEASE Bilateral    right 09-05-2016  @[facility] ;   left 2013   COLONOSCOPY  12/2019   COLONOSCOPY  01/2022   CYSTOSCOPY  03/05/2012   Procedure: CYSTOSCOPY;  Surgeon: Antony Haste, MD;  Location: [facility];  Service: Urology;  Laterality: N/A;  Cysto Clot Evacuation and Fulgeration Of [place] ([place])   CYSTOSCOPY WITH LITHOLAPAXY N/A 03/13/2019   Procedure: CYSTOSCOPY WITH lASER LITHOLAPAXY, DILATION OF STRICTURE;  Surgeon: Ihor Gully, MD;  Location: [facility];  Service: Urology;  Laterality: N/A;   CYSTOSCOPY WITH LITHOLAPAXY N/A 01/02/2022   Procedure: CYSTOSCOPY WITH LITHOTRIPSY;  Surgeon: Noel Christmas, MD;  Location: [facility];  Service: Urology;  Laterality: N/A;   CYSTOSCOPY WITH RETROGRADE PYELOGRAM, URETEROSCOPY AND STENT  PLACEMENT Right 03/13/2022   Procedure: CYSTOSCOPY WITH RETROGRADE PYELOGRAM, URETEROSCOPY AND STENT PLACEMENT;  Surgeon: Noel Christmas, MD;  Location: [facility];  Service: Urology;  Laterality: Right;  75 MINS   CYSTOSCOPY WITH RETROGRADE PYELOGRAM, URETEROSCOPY AND STENT PLACEMENT Right 08/07/2022   Procedure: CYSTOSCOPY WITH RETROGRADE PYELOGRAM, URETEROSCOPY AND STENT PLACEMENT, REMOVAL OF NEPHROURETERAL STENT;  Surgeon: Noel Christmas, MD;  Location: [facility];  Service: Urology;  Laterality: Right;  75 MINS   CYSTOSCOPY WITH URETHRAL DILATATION N/A 08/23/2020   Procedure: CYSTOSCOPY WITH URETHRAL DILATATION, BLADDER BIOPSY, FULGERATION;  Surgeon: Noel Christmas, MD;  Location: [facility];  Service: Urology;  Laterality: N/A;  1 HR   CYSTOSCOPY WITH URETHRAL DILATATION N/A 03/13/2021   Procedure: CYSTOSCOPY WITH URETHRAL BALLOON DILATATION/ FOLEY CATHETER PLACEMENT;  Surgeon: Noel Christmas, MD;  Location: [facility];  Service: Urology;  Laterality: N/A;   CYSTOSCOPY WITH URETHRAL DILATATION Left 07/08/2009   @[facility] :   with left  RTG   CYSTOSCOPY WITH URETHRAL DILATATION N/A 06/19/2022   Procedure: CYSTOSCOPY WITH OPTILUME URETHRAL DILATATION;  Surgeon: Noel Christmas, MD;  Location: [facility];  Service: Urology;  Laterality: N/A;   CYSTOSCOPY/URETEROSCOPY/HOLMIUM LASER/STENT PLACEMENT Bilateral 01/29/2023   Procedure: DIAGNOSTIC CYSTOSCOPY , LEFT RETROGRADE PYELOGRAM, LEFT URETERAL STENT PLACEMENT, AND STONE BASKETING;  Surgeon: Noel Christmas, MD;  [facility];  Service: Urology;  Laterality: Bilateral;   HOLMIUM LASER APPLICATION N/A 08/23/2020   Procedure: HOLMIUM LASER APPLICATION OF URETHRAL STONE;  Surgeon: Noel Christmas, MD;  Location: [facility];  Service: Urology;  Laterality: N/A;   HOLMIUM LASER APPLICATION Right 03/13/2022   Procedure: HOLMIUM LASER APPLICATION;  Surgeon: Noel Christmas, MD;  Location: [facility];  Service: Urology;  Laterality: Right;   HOLMIUM LASER APPLICATION Right 08/07/2022   Procedure: HOLMIUM LASER APPLICATION;  Surgeon: Noel Christmas, MD;  Location: [facility];  Service: Urology;  Laterality: Right;   INGUINAL HERNIA REPAIR Right 05/03/2015   @[facility]    INSERTION PROSTATE RADIATION SEED  03/04/2009   @[facility]    IR NEPHROSTOMY PLACEMENT RIGHT  07/18/2022   KNEE CARTILAGE SURGERY Left    1980S   LAPAROSCOPIC PARAESOPHAGEAL HERNIA REPAIR  02/11/2017   @[facility] ;  W/  NISSEN FUNDOPLICATION (MESH)   PROSTATE BIOPSY     TOE SURGERY     TRANSURETHRAL RESECTION OF BLADDER TUMOR N/A 01/02/2022   Procedure: TRANSURETHRAL RESECTION OF BLADDER TUMOR (TURBT);  Surgeon: Noel Christmas, MD;  Location: [facility];  Service: Urology;  Laterality: N/A;   TRANSURETHRAL RESECTION OF BLADDER TUMOR N/A 01/29/2023   Procedure: TRANSURETHRAL RESECTION OF BLADDER TUMOR (TURBT);  Surgeon: Noel Christmas, MD;  Location: [facility];  Service: Urology;  Laterality: N/A;      Allergies: Sulfa antibiotics  Medications: Prior to Admission medications   Medication Sig Start Date End Date Taking? Authorizing Provider  amLODipine (  NORVASC) 5 MG tablet Take 5 mg by mouth at bedtime.   Yes [provider]  celecoxib (CELEBREX) 100 MG capsule Take 100 mg by mouth at bedtime. 12/23/20  Yes [provider]  Cholecalciferol (VITAMIN D-3) 125 MCG (5000 UT) TABS Take 5,000 Units by mouth daily at 12 noon.   Yes [provider]  ciclopirox (LOPROX) 0.77 % cream Apply 1 application topically 2 (two) times daily as needed (dry/flaky skin).   Yes [provider]  Ciclopirox 1 % shampoo Apply 1 application topically daily as needed (dry/flaky skin).   Yes [provider]  doxepin (SINEQUAN) 25 MG capsule Take 25 mg by mouth at bedtime.   Yes [provider]  finasteride (PROSCAR)  5 MG tablet Take 5 mg by mouth daily.   Yes [provider]  fluticasone furoate-vilanterol (BREO ELLIPTA) 100-25 MCG/ACT AEPB TAKE 1 PUFF BY MOUTH EVERY DAY 12/11/22  Yes Olalere, Adewale A, MD  levETIRAcetam (KEPPRA) 500 MG tablet Take 500 mg by mouth 3 (three) times daily.   Yes [provider]  losartan (COZAAR) 50 MG tablet Take 50 mg by mouth daily.   Yes [provider]  MYRBETRIQ 50 MG TB24 tablet Take 50 mg by mouth every evening. 07/23/22  Yes [provider]  OXcarbazepine ER (OXTELLAR XR) 600 MG TB24 Take 600 mg by mouth daily in the afternoon.   Yes [provider]  oxyCODONE (ROXICODONE) 5 MG immediate release tablet Take 1 tablet (5 mg total) by mouth every 4 (four) hours as needed for severe pain. 01/29/23  Yes Pace, Weyman Croon, MD  primidone (MYSOLINE) 50 MG tablet Take 75 mg by mouth at bedtime.   Yes [provider]  tamsulosin (FLOMAX) 0.4 MG CAPS capsule Take 1 capsule (0.4 mg total) by mouth daily. Patient taking differently: Take 0.4 mg by mouth at bedtime. 03/13/22  Yes Kasandra Knudsen D, MD  Testosterone 20.25 MG/ACT (1.62%) GEL Place 3 Pump onto the skin every evening.   Yes [provider]  Trospium Chloride 60 MG CP24 Take 1 capsule by mouth daily.   Yes [provider]  ustekinumab (STELARA) 90 MG/ML SOSY injection Inject 90 mg into the skin every 8 (eight) weeks.   Yes [provider]  vitamin B-12 (CYANOCOBALAMIN) 1000 MCG tablet Take 1,000 mcg by mouth daily.   Yes [provider]     Vital Signs: BP 100/77   Pulse (!) 101   Temp 99.5 F (37.5 C) (Oral)   Resp 15   Ht 5\' 8"  (1.727 m)   Wt 140 lb 9.6 oz (63.8 kg)   SpO2 97%   BMI 21.38 kg/m    Code Status: FULL CODE  Physical Exam: awake/alert; chest- few rt basilar crackles, left clear; heart- RRR; abd- soft,+BS,NT; no LE edema  Imaging: No results found.  Labs:  CBC: Recent Labs    07/18/22 0900  08/14/22 1524 01/29/23 0815 02/04/23 0800  WBC 6.7 6.6  --  6.9  HGB 13.3 14.5 13.6 13.5  HCT 40.9 44.8 40.0 39.9  PLT 245 246  --  222    COAGS: Recent Labs    07/18/22 0900 02/04/23 0800  INR 1.1 1.0    BMP: Recent Labs    07/18/22 0900 08/02/22 1015 08/14/22 1524 01/29/23 0815  NA 136 139 141 142  K 4.7 5.0 4.1 4.2  CL 103 107 100 107  CO2 27 27 25   --   GLUCOSE 113* 113* 104*  107*  BUN 30* 27* 23 32*  CALCIUM 8.9 9.3 9.8  --   CREATININE 1.95* 1.76* 1.71* 2.20*  GFRNONAA 36* 40* 42*  --     LIVER FUNCTION TESTS: No results for input(s): "BILITOT", "AST", "ALT", "ALKPHOS", "PROT", "ALBUMIN" in the last 8760 hours.  Assessment and Plan:  72 yo male with PMH sig for arthritis, BPH,  remote prostate cancer with prior seed implantation/ radiation therapy, complex partial seizures, HLD, GERD, skin cancer, meningioma resection 2010, HTN, UC, migraines, overactive bladder, renal cysts and nephrolithiasis. He had prior rt PCN by our team on 07/18/22.  He also has a hx of hematuria and underwent cysto with left ureteral stent  placement and TURBT on 01/29/23 with pathology revealing high grade urothelial carcinoma. The right ureteral orifice could not be visualized. He has elevated creatinine and rt hydronephrosis. He is scheduled today for right percutaneous nephrostomy/nephroureteral cath placement.Risks and benefits of right PCN placement was discussed with the patient including, but not limited to, infection, bleeding, significant bleeding causing loss or decrease in renal function or damage to adjacent structures.   All of the patient's questions were answered, patient is agreeable to proceed.  Consent signed and in chart.      Electronically Signed: D. Jeananne Rama, PA-C 02/04/2023, 8:38 AM   I spent a total of 20 minutes at the the patient's bedside AND on the patient's hospital floor or unit, greater than 50% of which was counseling/coordinating care for right  percutaneous nephrostomy/nephroureteral catheter placement

## 2023-02-07 DIAGNOSIS — D04121 Carcinoma in situ of skin of left upper eyelid, including canthus: Secondary | ICD-10-CM | POA: Diagnosis not present

## 2023-02-18 DIAGNOSIS — C44629 Squamous cell carcinoma of skin of left upper limb, including shoulder: Secondary | ICD-10-CM | POA: Diagnosis not present

## 2023-02-19 ENCOUNTER — Other Ambulatory Visit: Payer: Self-pay | Admitting: Medical Oncology

## 2023-02-19 DIAGNOSIS — C689 Malignant neoplasm of urinary organ, unspecified: Secondary | ICD-10-CM

## 2023-02-19 NOTE — Progress Notes (Signed)
Appt message sent.

## 2023-02-22 ENCOUNTER — Inpatient Hospital Stay: Payer: Medicare Other | Attending: Internal Medicine

## 2023-02-22 ENCOUNTER — Inpatient Hospital Stay (HOSPITAL_BASED_OUTPATIENT_CLINIC_OR_DEPARTMENT_OTHER): Payer: Medicare Other | Admitting: Internal Medicine

## 2023-02-22 VITALS — BP 176/87 | HR 82 | Temp 97.7°F | Resp 16 | Wt 141.7 lb

## 2023-02-22 DIAGNOSIS — Z79899 Other long term (current) drug therapy: Secondary | ICD-10-CM | POA: Insufficient documentation

## 2023-02-22 DIAGNOSIS — N1330 Unspecified hydronephrosis: Secondary | ICD-10-CM

## 2023-02-22 DIAGNOSIS — Z8546 Personal history of malignant neoplasm of prostate: Secondary | ICD-10-CM | POA: Insufficient documentation

## 2023-02-22 DIAGNOSIS — K5190 Ulcerative colitis, unspecified, without complications: Secondary | ICD-10-CM

## 2023-02-22 DIAGNOSIS — Z801 Family history of malignant neoplasm of trachea, bronchus and lung: Secondary | ICD-10-CM

## 2023-02-22 DIAGNOSIS — I10 Essential (primary) hypertension: Secondary | ICD-10-CM | POA: Diagnosis not present

## 2023-02-22 DIAGNOSIS — N289 Disorder of kidney and ureter, unspecified: Secondary | ICD-10-CM | POA: Diagnosis not present

## 2023-02-22 DIAGNOSIS — Z87891 Personal history of nicotine dependence: Secondary | ICD-10-CM | POA: Diagnosis not present

## 2023-02-22 DIAGNOSIS — C679 Malignant neoplasm of bladder, unspecified: Secondary | ICD-10-CM

## 2023-02-22 DIAGNOSIS — Z803 Family history of malignant neoplasm of breast: Secondary | ICD-10-CM | POA: Insufficient documentation

## 2023-02-22 DIAGNOSIS — C675 Malignant neoplasm of bladder neck: Secondary | ICD-10-CM | POA: Insufficient documentation

## 2023-02-22 DIAGNOSIS — C689 Malignant neoplasm of urinary organ, unspecified: Secondary | ICD-10-CM

## 2023-02-22 LAB — CMP (CANCER CENTER ONLY)
ALT: 9 U/L (ref 0–44)
AST: 16 U/L (ref 15–41)
Albumin: 4 g/dL (ref 3.5–5.0)
Alkaline Phosphatase: 85 U/L (ref 38–126)
Anion gap: 6 (ref 5–15)
BUN: 36 mg/dL — ABNORMAL HIGH (ref 8–23)
CO2: 25 mmol/L (ref 22–32)
Calcium: 9 mg/dL (ref 8.9–10.3)
Chloride: 108 mmol/L (ref 98–111)
Creatinine: 1.87 mg/dL — ABNORMAL HIGH (ref 0.61–1.24)
GFR, Estimated: 37 mL/min — ABNORMAL LOW (ref >60)
Glucose, Bld: 118 mg/dL — ABNORMAL HIGH (ref 70–99)
Potassium: 4.8 mmol/L (ref 3.5–5.1)
Sodium: 139 mmol/L (ref 135–145)
Total Bilirubin: 0.4 mg/dL (ref 0.3–1.2)
Total Protein: 7 g/dL (ref 6.5–8.1)

## 2023-02-22 LAB — CBC WITH DIFFERENTIAL (CANCER CENTER ONLY)
Abs Immature Granulocytes: 0.01 10*3/uL (ref 0.00–0.07)
Basophils Absolute: 0.1 10*3/uL (ref 0.0–0.1)
Basophils Relative: 1 %
Eosinophils Absolute: 0.3 10*3/uL (ref 0.0–0.5)
Eosinophils Relative: 4 %
HCT: 38.8 % — ABNORMAL LOW (ref 39.0–52.0)
Hemoglobin: 13.1 g/dL (ref 13.0–17.0)
Immature Granulocytes: 0 %
Lymphocytes Relative: 13 %
Lymphs Abs: 0.9 10*3/uL (ref 0.7–4.0)
MCH: 32 pg (ref 26.0–34.0)
MCHC: 33.8 g/dL (ref 30.0–36.0)
MCV: 94.9 fL (ref 80.0–100.0)
Monocytes Absolute: 0.5 10*3/uL (ref 0.1–1.0)
Monocytes Relative: 8 %
Neutro Abs: 4.9 10*3/uL (ref 1.7–7.7)
Neutrophils Relative %: 74 %
Platelet Count: 222 10*3/uL (ref 150–400)
RBC: 4.09 MIL/uL — ABNORMAL LOW (ref 4.22–5.81)
RDW: 12.7 % (ref 11.5–15.5)
WBC Count: 6.6 10*3/uL (ref 4.0–10.5)
nRBC: 0 % (ref 0.0–0.2)

## 2023-02-22 NOTE — Progress Notes (Signed)
[facility] [phone]   [phone]  CONSULT NOTE  REFERRING PHYSICIAN: Dr. Roselee Nova pace  REASON FOR CONSULTATION:  72 years old white male recently diagnosed with bladder cancer.  HPI Wesley [name] is a 72 y.o. male with past medical history significant for multiple medical problems including osteoarthritis, benign prostatic hypertrophy, complex partial seizure, essential tremor, hypertension, dyslipidemia, history of prostate cancer status post seed implants and external beam radiotherapy in 2010.  Patient also has a history of ulcerative colitis, stage IV renal insufficiency and kidney stones.  He was seen by Dr. Arita Miss for bilateral hydronephrosis and kidney stone.  He underwent diagnostic cystoscopy with left retrograde pyelogram and ureteral stent as well as TURBT on January 29, 2023.  The final pathology ([id number]) of the bladder neck lesion showed infiltrating high-grade papillary urothelial carcinoma.  The carcinoma invades the muscularis propria (detrusor muscle).  Noninvasive high-grade papillary carcinoma is present. Immunohistochemistry stains shows the neoplasm stains positive for high molecular weight cytokeratin, p63, and gata3, negative for prostein and nkx3.1 (androgen markers). The staining pattern supports these neoplastic cells are urothelial origin.  Dr. Arita Miss kindly referred the patient to me today for evaluation and recommendation regarding neoadjuvant therapy.  When seen today the patient is feeling fine except for fatigue.  He also has occasional balance issues.  He denied having any current chest pain, shortness of breath, cough or hemoptysis.  He has no nausea, vomiting, diarrhea or constipation.  He has no headache or visual changes.  His last seizure was more than a year ago.  He was diagnosed with ulcerative colitis 10 years ago and has been on treatment with immunotherapy with Humira and then Stelara every 8 weeks.  He did not  have any recent flare recently. Family history significant for father with benign prostatic hypertrophy and died from heart disease.  Mother had breast cancer and died from dementia.  He has a sister who had lung cancer and essential thrombocythemia. The patient is married and has 2 children a daughter who is 22 and son 68.  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  He works on Par[employer].  The patient has a history of smoking for around 20 years but quit 34 years ago.  He drinks alcohol occasionally and no history of drug abuse.  HPI  Past Medical History:  Diagnosis Date   Arthritis    Atypical chest pain    cardiologist-- dr Leeann Must;  chronic occasional,  pressure / discomfort, LOV 07/14/21 (as of 06/08/22)   Bladder calculus    BPH with obstruction/lower urinary tract symptoms    Follows with Dr. Kasandra Knudsen, urologist.   Complex partial seizure [facility])    followed by neurologist--- dr d. 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 Daphane Shepherd at [facility] neurology, LOV w/ FNP on 03/21/22 in Epic ( as of 06/08/22)   Generalized abdominal pain    w/  bloating due to UC  take doxepin   GERD (gastroesophageal reflux disease)    H/O urinary retention    History of bladder stone    History of hiatal hernia    History of kidney stones    History of prostate cancer 2010   urologist--- dr pace;  dx 2010,  Gleason 4+3, PSA 5.2;   03-04-2009  s/p radiactive prostate seed implants and external beam radiation   History of repair of hiatal hernia    02-11-2017  s/p lap paraesophageal hernia repair w/ nissen fundoplication   History of skin cancer    Hx of resection of meningioma 02/11/2009   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 Rhetta Mura (GI);  dx 2010;  current treatment stelara q8wks, LOV 06/07/22 (as of 06/08/22)   Wears glasses     Past Surgical History:  Procedure Laterality Date   BOTOX INJECTION N/A 01/29/2023   Procedure: BOTOX INJECTION 100 UNITS;  Surgeon: Noel Christmas, MD;  Location: [facility];  Service: Urology;  Laterality: N/A;   BRAIN MENINGIOMA EXCISION  02/11/2009   @[facility] ;  craniotomy , right temporal medial sphonoid wing resection   CARPAL TUNNEL RELEASE Bilateral    right 09-05-2016  @[facility] ;   left 2013   COLONOSCOPY  12/2019   COLONOSCOPY  01/2022   CYSTOSCOPY  03/05/2012   Procedure: CYSTOSCOPY;  Surgeon: Antony Haste, MD;  Location: [facility];  Service: Urology;  Laterality: N/A;  Cysto Clot Evacuation and Fulgeration Of [place] ([place])   CYSTOSCOPY WITH LITHOLAPAXY N/A 03/13/2019   Procedure: CYSTOSCOPY WITH lASER LITHOLAPAXY, DILATION OF STRICTURE;  Surgeon: Ihor Gully, MD;  Location: [facility];  Service: Urology;  Laterality: N/A;   CYSTOSCOPY WITH LITHOLAPAXY N/A 01/02/2022   Procedure: CYSTOSCOPY WITH LITHOTRIPSY;  Surgeon: Noel Christmas, MD;  Location: [facility];  Service: Urology;  Laterality: N/A;   CYSTOSCOPY WITH RETROGRADE PYELOGRAM, URETEROSCOPY AND STENT PLACEMENT Right 03/13/2022   Procedure: CYSTOSCOPY WITH RETROGRADE PYELOGRAM, URETEROSCOPY AND STENT PLACEMENT;  Surgeon: Noel Christmas, MD;  Location: [facility];  Service: Urology;  Laterality: Right;  75 MINS   CYSTOSCOPY WITH RETROGRADE PYELOGRAM, URETEROSCOPY AND STENT PLACEMENT Right 08/07/2022   Procedure:  CYSTOSCOPY WITH RETROGRADE PYELOGRAM, URETEROSCOPY AND STENT PLACEMENT, REMOVAL OF NEPHROURETERAL STENT;  Surgeon: Noel Christmas, MD;  Location: [facility];  Service: Urology;  Laterality: Right;  75 MINS   CYSTOSCOPY WITH URETHRAL DILATATION N/A 08/23/2020   Procedure: CYSTOSCOPY WITH URETHRAL DILATATION, BLADDER BIOPSY, FULGERATION;  Surgeon: Noel Christmas, MD;  Location: [facility] [place];  Service: Urology;  Laterality: N/A;  1 HR   CYSTOSCOPY WITH URETHRAL DILATATION N/A 03/13/2021   Procedure: CYSTOSCOPY WITH URETHRAL BALLOON DILATATION/ FOLEY CATHETER PLACEMENT;  Surgeon: Noel Christmas, MD;  Location: [facility];  Service: Urology;  Laterality: N/A;   CYSTOSCOPY WITH URETHRAL DILATATION Left 07/08/2009   @[facility] :   with left  RTG   CYSTOSCOPY WITH URETHRAL DILATATION N/A 06/19/2022   Procedure: CYSTOSCOPY WITH OPTILUME URETHRAL DILATATION;  Surgeon: Noel Christmas, MD;  Location: [facility];  Service: Urology;  Laterality: N/A;   CYSTOSCOPY/URETEROSCOPY/HOLMIUM LASER/STENT PLACEMENT Bilateral 01/29/2023   Procedure: DIAGNOSTIC CYSTOSCOPY , LEFT RETROGRADE PYELOGRAM, LEFT URETERAL STENT PLACEMENT, AND STONE BASKETING;  Surgeon: Noel Christmas, MD;  Location: [facility];  Service:  Urology;  Laterality: Bilateral;   HOLMIUM LASER APPLICATION N/A 08/23/2020   Procedure: HOLMIUM LASER APPLICATION OF URETHRAL STONE;  Surgeon: Noel Christmas, MD;  Location: [facility];  Service: Urology;  Laterality: N/A;   HOLMIUM LASER APPLICATION Right 03/13/2022   Procedure: HOLMIUM LASER APPLICATION;  Surgeon: Noel Christmas, MD;  Location: [facility];  Service: Urology;  Laterality: Right;   HOLMIUM LASER APPLICATION Right 08/07/2022   Procedure: HOLMIUM LASER APPLICATION;  Surgeon: Noel Christmas, MD;  Location[facility];  Service: Urology;  Laterality: Right;   INGUINAL HERNIA REPAIR Right 05/03/2015   @[facility]    INSERTION  PROSTATE RADIATION SEED  03/04/2009   @[facility]    IR NEPHROSTOMY PLACEMENT RIGHT  07/18/2022   IR URETERAL STENT RIGHT NEW ACCESS W/O SEP NEPHROSTOMY CATH  02/04/2023   KNEE CARTILAGE SURGERY Left    1980S   LAPAROSCOPIC PARAESOPHAGEAL HERNIA REPAIR  02/11/2017   @[facility] ;  W/  NISSEN FUNDOPLICATION (MESH)   PROSTATE BIOPSY     TOE SURGERY     TRANSURETHRAL RESECTION OF BLADDER TUMOR N/A 01/02/2022   Procedure: TRANSURETHRAL RESECTION OF BLADDER TUMOR (TURBT);  Surgeon: Noel Christmas, MD;  Location: [facility];  Service: Urology;  Laterality: N/A;   TRANSURETHRAL RESECTION OF BLADDER TUMOR N/A 01/29/2023   Procedure: TRANSURETHRAL RESECTION OF BLADDER TUMOR (TURBT);  Surgeon: Noel Christmas, MD;  Location: [facility];  Service: Urology;  Laterality: N/A;    No family history on file.  Social History Social History   Tobacco Use   Smoking status: Former    Packs/day: 1.00    Years: 10.00    Additional pack years: 0.00    Total pack years: 10.00    Types: Cigarettes    Quit date: 03/02/1987    Years since quitting: 36.0   Smokeless tobacco: Never  Vaping Use   Vaping Use: Never used  Substance Use Topics   Alcohol use: Yes    Comment: Rare   Drug use: Never    Allergies  Allergen Reactions   Sulfa Antibiotics Rash    Childhood    Current Outpatient Medications  Medication Sig Dispense Refill   amLODipine (NORVASC) 5 MG tablet Take 5 mg by mouth at bedtime.     celecoxib (CELEBREX) 100 MG capsule Take 100 mg by mouth at bedtime.     Cholecalciferol (VITAMIN D-3) 125 MCG (5000 UT) TABS Take 5,000 Units by mouth daily at 12 noon.     ciclopirox (LOPROX) 0.77 % cream Apply 1 application topically 2 (two) times daily as needed (dry/flaky skin).     Ciclopirox 1 % shampoo Apply 1 application topically daily as needed (dry/flaky skin).     doxepin (SINEQUAN) 25 MG capsule Take 25 mg by mouth at bedtime.     finasteride (PROSCAR) 5 MG tablet Take  5 mg by mouth daily.     fluticasone furoate-vilanterol (BREO ELLIPTA) 100-25 MCG/ACT AEPB TAKE 1 PUFF BY MOUTH EVERY DAY 60 each 3   levETIRAcetam (KEPPRA) 500 MG tablet Take 500 mg by mouth 3 (three) times daily.     losartan (COZAAR) 50 MG tablet Take 50 mg by mouth daily.     MYRBETRIQ 50 MG TB24 tablet Take 50 mg by mouth every evening.     OXcarbazepine ER (OXTELLAR XR) 600 MG TB24 Take 600 mg by mouth daily in the afternoon.     oxyCODONE (ROXICODONE) 5 MG immediate release tablet Take 1 tablet (  5 mg total) by mouth every 4 (four) hours as needed for severe pain. 30 tablet 0   primidone (MYSOLINE) 50 MG tablet Take 75 mg by mouth at bedtime.     tamsulosin (FLOMAX) 0.4 MG CAPS capsule Take 1 capsule (0.4 mg total) by mouth daily. (Patient taking differently: Take 0.4 mg by mouth at bedtime.) 30 capsule 0   Testosterone 20.25 MG/ACT (1.62%) GEL Place 3 Pump onto the skin every evening.     Trospium Chloride 60 MG CP24 Take 1 capsule by mouth daily.     ustekinumab (STELARA) 90 MG/ML SOSY injection Inject 90 mg into the skin every 8 (eight) weeks.     vitamin B-12 (CYANOCOBALAMIN) 1000 MCG tablet Take 1,000 mcg by mouth daily.     No current facility-administered medications for this visit.    Review of Systems  Constitutional: positive for fatigue Eyes: negative Ears, nose, mouth, throat, and face: negative Respiratory: negative Cardiovascular: negative Gastrointestinal: negative Genitourinary:positive for decreased stream Integument/breast: negative Hematologic/lymphatic: negative Musculoskeletal:negative Neurological: negative Behavioral/Psych: negative Endocrine: negative Allergic/Immunologic: negative  Physical Exam  WGN:FAOZH, healthy, no distress, well nourished, and well developed SKIN: skin color, texture, turgor are normal, no rashes or significant lesions HEAD: Normocephalic, No masses, lesions, tenderness or abnormalities EYES: normal, PERRLA, Conjunctiva are  pink and non-injected EARS: External ears normal, Canals clear OROPHARYNX:no exudate, no erythema, and lips, buccal mucosa, and tongue normal  NECK: supple, no adenopathy, no JVD LYMPH:  no palpable lymphadenopathy, no hepatosplenomegaly LUNGS: clear to auscultation , and palpation HEART: regular rate & rhythm, no murmurs, and no gallops ABDOMEN:abdomen soft, non-tender, normal bowel sounds, and no masses or organomegaly BACK: Back symmetric, no curvature., No CVA tenderness EXTREMITIES:no joint deformities, effusion, or inflammation, no edema  NEURO: alert & oriented x 3 with fluent speech, no focal motor/sensory deficits  PERFORMANCE STATUS: ECOG 1  LABORATORY DATA: Lab Results  Component Value Date   WBC 6.9 02/04/2023   HGB 13.5 02/04/2023   HCT 39.9 02/04/2023   MCV 94.5 02/04/2023   PLT 222 02/04/2023      Chemistry      Component Value Date/Time   NA 138 02/04/2023 0800   K 3.9 02/04/2023 0800   CL 104 02/04/2023 0800   CO2 24 02/04/2023 0800   BUN 38 (H) 02/04/2023 0800   CREATININE 2.21 (H) 02/04/2023 0800      Component Value Date/Time   CALCIUM 8.8 (L) 02/04/2023 0800   ALKPHOS 63 04/29/2009 2224   AST 26 04/29/2009 2224   ALT 20 04/29/2009 2224   BILITOT 0.5 04/29/2009 2224       RADIOGRAPHIC STUDIES: IR URETERAL STENT RIGHT NEW ACCESS W/O SEP NEPHROSTOMY CATH  Result Date: 02/04/2023 INDICATION: Hydronephrosis, distal right UVJ obstruction, prostate cancer EXAM: ULTRASOUND AND FLUOROSCOPIC 10 FRENCH RIGHT NEPHROSTOMY COMPARISON:  01/11/2023 CT MEDICATIONS: 2 g Rocephin; The antibiotic was administered in an appropriate time frame prior to skin puncture. ANESTHESIA/SEDATION: Moderate (conscious) sedation was employed during this procedure. A total of Versed 2.0 mg and Fentanyl 0 100 mcg was administered intravenously by the radiology nurse. Total intra-service moderate Sedation Time: 16 minutes. The patient's level of consciousness and vital signs were  monitored continuously by radiology nursing throughout the procedure under my direct supervision. CONTRAST:  10 cc-administered into the collecting system(s) FLUOROSCOPY: Radiation Exposure Index (as provided by the fluoroscopic device): 32 mGy Kerma COMPLICATIONS: None immediate. PROCEDURE: Informed written consent was obtained from the patient after a thorough discussion of the procedural risks,  benefits and alternatives. All questions were addressed. Maximal Sterile Barrier Technique was utilized including caps, mask, sterile gowns, sterile gloves, sterile drape, hand hygiene and skin antiseptic. A timeout was performed prior to the initiation of the procedure. Previous imaging reviewed. Patient position prone. Hydronephrotic kidney was localized between the 11 and 12 rib for posterolateral access. Under sterile conditions and local anesthesia, an 18 gauge 15 cm needle was advanced into a dilated posterior calyx. Needle position confirmed with ultrasound. Images obtained for documentation. There was return of blood tinged urine. Amplatz guidewire inserted. Five [place] Kumpe catheter advanced. Catheter advanced into the proximal ureter. Contrast injection demonstrates diffuse nonobstructing filling defects throughout the collecting system compatible with blood related to the puncture/needle access. Kumpe catheter advanced to the right UVJ over an Amplatz guidewire. Contrast injection confirms right UVJ obstruction. Glidewire would not pass through the obstruction. Right distal UVJ obstruction feels firm with the wire and catheter. Because of the collecting system hemorrhage, the access was retracted to the renal pelvis. Ten French nephrostomy advanced over the guidewire. Retention loop formed in the renal pelvis. Contrast injection confirms position. Images obtained for documentation. Access secured with Prolene suture and sterile dressing. Gravity drainage bag connected. IMPRESSION: 1. Successful right  percutaneous nephrostomy tube placement under ultrasound and fluoroscopic guidance. 2. Right distal UVJ obstruction, unable to cross with catheter and guidewire access for nephroureteral placement today. Electronically Signed   By: Judie Petit.  Shick M.D.   On: 02/04/2023 10:19    ASSESSMENT: This is a very pleasant 72 years old white male with multiple medical problems who was recently diagnosed with stage II-III (T2 grade 3, N0, M0) muscle invasive papillary urothelial carcinoma of the bladder with bilateral hydronephrosis status post TURBT on January 29, 2023 by Dr. Arita Miss. The patient has stage IV renal insufficiency in addition to history of ulcerative colitis currently on treatment with Stelara.  He has a history of prostate cancer in 2010 status post seed implants as well as external beam radiotherapy.  PLAN: I had a lengthy discussion with the patient today about his current disease stage, prognosis and treatment options. I discussed with the patient the NCCN guideline for treatment of patient with a stage II/III urothelial carcinoma of the bladder. I did explain to the patient that the standard treatment is neoadjuvant cisplatin based combination chemotherapy followed by radical cystectomy but unfortunately the patient has significant renal insufficiency and he is not a good candidate for cisplatin based chemotherapy. I discussed with the patient the other recommendation including proceeding with cystectomy alone since he is not eligible for cisplatin based chemotherapy followed by adjuvant treatment but this may include the cisplatin based adjuvant chemotherapy or immunotherapy which is still another issues with his history of renal insufficiency and ulcerative colitis. I also discussed with the patient the third option of bladder preservation treatment with concurrent chemoradiation if he is eligible to receive additional treatment with radiotherapy.  I will refer him to radiation oncology for discussion of  this option too. If the patient decided to proceed with the cystectomy, I will see him back for follow-up visit after the surgery for discussion of any adjuvant treatment therapy option that may help this patient. I gave the patient the time to ask questions and I answered them completely to his satisfaction. He was advised to call immediately if he has any other concerning symptoms in the interval. The patient voices understanding of current disease status and treatment options and is in agreement with the current care  plan.  All questions were answered. The patient knows to call the clinic with any problems, questions or concerns. We can certainly see the patient much sooner if necessary.  Thank you so much for allowing me to participate in the care of Wesley [name]. I will continue to follow up the patient with you and assist in his care.  The total time spent in the appointment was 90 minutes.  Disclaimer: This note was dictated with voice recognition software. Similar sounding words can inadvertently be transcribed and may not be corrected upon review.   Lajuana Matte Feb 22, 2023, 9:07 AM

## 2023-02-25 NOTE — Progress Notes (Signed)
GU Location of Tumor / Histology: Bladder Ca  Additional Dx:  Prostate Ca (2010)  02/04/2023 Dr. Kasandra Knudsen IR Ureteral Stent Right New access without sep nephrostomy Hydronephrosis, distal right UVJ obstruction, prostate cancer   IMPRESSION: 1. Successful right percutaneous nephrostomy tube placement under ultrasound and fluoroscopic guidance. 2. Right distal UVJ obstruction, unable to cross with catheter and guidewire access for nephroureteral placement today.   10/11/2022 Dr. Virl Diamond CT Chest without Contrast CLINICAL DATA: Pleural effusion, pneumonia   IMPRESSION: There is interval resolution of right pleural effusion and almost complete clearing of patchy infiltrate in right lower lobe since 08/14/2022.  There is 2.2 x 1.2 cm pleural-based density in the medial right lower lobe which may suggest residual atelectasis or scarring. There are small linear densities in the posterior left lower lung fields suggesting possible scarring.  There is interval appearance of moderate right hydronephrosis. Possibility of obstruction caused by right ureteral calculus is not excluded. Please correlate with clinical symptoms and consider CT scan of abdomen and pelvis.  Bilateral renal stones are seen larger 1 measuring 8 mm in size in the lower pole of right kidney. Bilateral renal cysts.  Extensive coronary artery calcifications are seen. Aortic atherosclerosis.  04/14/2020 Dr. Ihor Gully MR Abdomen with/without Contrast CLINICAL DATA:  72 year old male with history of intermittent hematuria. History of kidney stones.  IMPRESSION: 1. The lesion of concern in the interpolar region of the right kidney has imaging characteristics compatible with a Bosniak class 2 cyst. 2. Multiple other simple cyst (Bosniak class 1) in the kidneys bilaterally.   04/30/2009 Dr. Katherine Roan CT Pelvis without Contrast Clinical Data:  Right mid abdominal pain.  Prostate cancer.  Renal  insufficiency.    CT ABDOMEN  Findings:  A moderate sized hiatal hernia is associated with mild  atelectasis at both lung bases.  There is no significant pleural  effusion.  The liver, spleen, gallbladder, pancreas and adrenal  glands appear normal.  There are bilateral renal cysts measuring up to 7.1 cm on the left.  There is mild right-sided hydronephrosis and perinephric soft tissue stranding.  There is a distal right ureteral calculus  described under the pelvic findings below.  No renal calculi are  seen.  Moderate stool is present throughout the colon.    IMPRESSION:    1. New mild right-sided hydronephrosis and hydroureter secondary  to a distal right ureteral calculus described below.  2.  Moderate constipation.  3.  Bilateral renal cysts.    CT PELVIS    Findings:  The right ureter is dilated into the pelvis.  There is a  3 mm calculus in the distal right ureter on image 66.  This is not  clearly seen on the scout image.  There is no evidence of bladder  calculus.  There are multiple brachytherapy seeds within the  prostate gland which is mildly enlarged.  No pelvic mass, fluid  collection or inflammatory process is demonstrated.  There is  prominent fat in both inguinal canals, right greater than left.  There are degenerative changes of the lower lumbar spine with a  possible unilateral pars defect on the right at L5.    IMPRESSION:    1. Obstructing 3 mm calculus in the distal right ureter.  2.  Postoperative changes related to prostate brachytherapy for  prostate cancer.    Past/Anticipated interventions by urology, if any: NA  Past/Anticipated interventions by medical oncology, if any:   Dr. Arbutus Ped   Weight changes, if  any:  Weight loss of 6-10 pounds in 6 months.  Not much of an appetite.  IPSS:  27  Bowel/Bladder complaints, if any:  When sitting is ok but when standing unable to control urine flow is wearing Depends undergarment as protective barrier.  No bowel issue  at this time.  Nausea/Vomiting, if any: No  Pain issues, if any: 0/10  SAFETY ISSUES: Prior radiation? Yes, Prostate (2010 seed implant and external radiotherapy).  Pacemaker/ICD? No Possible current pregnancy? Male Is the patient on methotrexate? No  Current Complaints / other details:  Occasional loss of balance over past week or so per patient.  No falls reported.

## 2023-02-26 ENCOUNTER — Ambulatory Visit
Admission: RE | Admit: 2023-02-26 | Discharge: 2023-02-26 | Disposition: A | Discharge status: Home / Self Care | Payer: Medicare Other | Source: Ambulatory Visit | Attending: Radiation Oncology | Admitting: Radiation Oncology

## 2023-02-26 ENCOUNTER — Encounter: Payer: Self-pay | Admitting: Radiation Oncology

## 2023-02-26 VITALS — BP 147/82 | HR 85 | Temp 97.6°F | Resp 18 | Ht 68.0 in | Wt 139.4 lb

## 2023-02-26 DIAGNOSIS — E785 Hyperlipidemia, unspecified: Secondary | ICD-10-CM | POA: Diagnosis not present

## 2023-02-26 DIAGNOSIS — K219 Gastro-esophageal reflux disease without esophagitis: Secondary | ICD-10-CM | POA: Insufficient documentation

## 2023-02-26 DIAGNOSIS — C679 Malignant neoplasm of bladder, unspecified: Secondary | ICD-10-CM

## 2023-02-26 DIAGNOSIS — C61 Malignant neoplasm of prostate: Secondary | ICD-10-CM | POA: Insufficient documentation

## 2023-02-26 DIAGNOSIS — G250 Essential tremor: Secondary | ICD-10-CM | POA: Insufficient documentation

## 2023-02-26 DIAGNOSIS — Z87442 Personal history of urinary calculi: Secondary | ICD-10-CM | POA: Diagnosis not present

## 2023-02-26 DIAGNOSIS — Z85828 Personal history of other malignant neoplasm of skin: Secondary | ICD-10-CM | POA: Diagnosis not present

## 2023-02-26 DIAGNOSIS — N281 Cyst of kidney, acquired: Secondary | ICD-10-CM | POA: Insufficient documentation

## 2023-02-26 DIAGNOSIS — Z7951 Long term (current) use of inhaled steroids: Secondary | ICD-10-CM | POA: Diagnosis not present

## 2023-02-26 DIAGNOSIS — N1330 Unspecified hydronephrosis: Secondary | ICD-10-CM | POA: Diagnosis not present

## 2023-02-26 DIAGNOSIS — Z87891 Personal history of nicotine dependence: Secondary | ICD-10-CM | POA: Insufficient documentation

## 2023-02-26 DIAGNOSIS — M129 Arthropathy, unspecified: Secondary | ICD-10-CM | POA: Insufficient documentation

## 2023-02-26 DIAGNOSIS — G4700 Insomnia, unspecified: Secondary | ICD-10-CM | POA: Diagnosis not present

## 2023-02-26 DIAGNOSIS — Z923 Personal history of irradiation: Secondary | ICD-10-CM | POA: Diagnosis not present

## 2023-02-26 DIAGNOSIS — K51919 Ulcerative colitis, unspecified with unspecified complications: Secondary | ICD-10-CM | POA: Insufficient documentation

## 2023-02-26 DIAGNOSIS — C675 Malignant neoplasm of bladder neck: Secondary | ICD-10-CM | POA: Diagnosis not present

## 2023-02-26 DIAGNOSIS — Z79899 Other long term (current) drug therapy: Secondary | ICD-10-CM | POA: Insufficient documentation

## 2023-02-26 DIAGNOSIS — I10 Essential (primary) hypertension: Secondary | ICD-10-CM | POA: Diagnosis not present

## 2023-02-26 NOTE — Progress Notes (Signed)
Radiation Oncology         [phone] ________________________________  Initial outpatient Consultation  Name: Wesley [name] MRN: [id number]  Date of Service: 02/26/2023 DOB: 1951-06-11  QM:[id number], Teresita Madura, MD  Si Gaul, MD   REFERRING PHYSICIAN: Si Gaul, MD  DIAGNOSIS: 72 yo gentleman with muscle invasive bladder cancer    ICD-10-CM   1. 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 male seen at the request of Dr. Arbutus Ped. He has a history of Gleason 4+3 prostate cancer, diagnosed in 10/2008 and s/p EBRT in 12/2008 followed by brachytherapy boost on 03/04/09; PSA has been undetectable since that time. He has additional complicated urologic history including bulbar urethral stricture, overactive bladder, and urolithiasis, s/p litholapaxy in 03/2019, urethral dilatation in 08/2020, 03/2021, and 06/2022, lithotripsy in 12/2021, and ureteroscopy with stent placement in 03/2022 and 07/2022. More recently, he developed gross hematuria and difficulty emptying his bladder. He underwent CT A/P on 01/11/23 showing new marked right hydroureteronephrosis following ureteral stent removal with no obstructing ureteral calculus seen and stable right nephrolithiasis.  There was no evidence of metastatic disease. Due to declining renal function, he was taken for diagnostic/therapeutic cystoscopy on 01/29/23 under the care of Dr. Arita Miss. During the procedure, he was found to have a fixed prostatic urethra with necrotic exudate/stone adherent to bilateral prostate lobes as well as a 3 cm papillary mass at the bladder neck/trigone and extruded brachytherapy seed in the prostatic urethra. The 3 cm bladder neck tumor was resected at that time, and pathology revealed infiltrating high grade papillary urothelial carcinoma invading the muscularis propria and some noninvasive high grade papillary urothelial carcinoma present  as well. He had a left nephroureteral stent placement and Botox bladder injection at the time of procedure as well.  He subsequently underwent a right nephrostomy tube placement under the care of Dr. Miles Costain in interventional radiology on 02/04/2023.   He was referred to Dr. Arbutus Ped on 02/22/23 to discuss potential systemic treatment options. Per Dr. Asa Lente note, the patient is not a good candidate for cisplatin-based chemotherapy given his significant renal insufficiency so he has kindly been referred to [place] today to discuss the potential role for external beam bladder radiation concurrent with chemotherapy versus cystectomy and likely, adjuvant systemic therapy.  PREVIOUS RADIATION THERAPY: Yes  12/2008 - 01/2009: External beam radiation to the prostate and pelvic lymph nodes [facility] Running) 03/04/09: Brachytherapy seed boost to the prostate ([place] [facility])  PAST MEDICAL HISTORY:  Past Medical History:  Diagnosis Date   Arthritis    Atypical chest pain    cardiologist-- dr Leeann Must;  chronic occasional,  pressure / discomfort, LOV 07/14/21 (as of 06/08/22)   Bladder calculus    BPH with obstruction/lower urinary tract symptoms    Follows with Dr. Kasandra Knudsen, urologist.   Complex partial seizure [facility])    followed by neurologist--- dr d. 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 Daphane Shepherd at [facility] neurology, LOV w/ FNP on 03/21/22 in Epic ( as of 06/08/22)   Generalized abdominal pain    w/  bloating due to UC  take doxepin   GERD (gastroesophageal reflux disease)    H/O urinary retention  History of bladder stone    History of hiatal hernia    History of kidney stones    History of prostate cancer 2010   urologist--- dr pace;   dx 2010,  Gleason 4+3, PSA 5.2;   03-04-2009  s/p radiactive prostate  seed implants and external beam radiation   History of repair of hiatal hernia    02-11-2017  s/p lap paraesophageal hernia repair w/ nissen fundoplication   History of skin cancer    Hx of resection of meningioma 02/11/2009   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 Rhetta Mura (GI);  dx 2010;  current treatment stelara q8wks, LOV 06/07/22 (as of 06/08/22)   Wears glasses       PAST SURGICAL HISTORY: Past Surgical History:  Procedure Laterality Date   BOTOX INJECTION N/A 01/29/2023   Procedure: BOTOX INJECTION 100 UNITS;  Surgeon: Noel Christmas, MD;  Location: [facility] [place];  Service: Urology;  Laterality: N/A;   BRAIN MENINGIOMA EXCISION  02/11/2009   @[facility] ;  craniotomy , right temporal medial sphonoid wing resection   CARPAL TUNNEL RELEASE Bilateral    right 09-05-2016  @[facility] ;   left 2013   COLONOSCOPY  12/2019   COLONOSCOPY  01/2022   CYSTOSCOPY  03/05/2012   Procedure: CYSTOSCOPY;  Surgeon: Antony Haste, MD;  Location: [facility];  Service: Urology;  Laterality: N/A;  Cysto Clot Evacuation and Fulgeration Of [place] ([place])   CYSTOSCOPY WITH LITHOLAPAXY N/A 03/13/2019   Procedure: CYSTOSCOPY WITH lASER LITHOLAPAXY, DILATION OF STRICTURE;  Surgeon: Ihor Gully, MD;  Location: [facility] [place];  Service: Urology;  Laterality: N/A;   CYSTOSCOPY WITH LITHOLAPAXY N/A 01/02/2022   Procedure: CYSTOSCOPY WITH LITHOTRIPSY;  Surgeon: Noel Christmas, MD;  Location: [facility];  Service: Urology;  Laterality: N/A;   CYSTOSCOPY WITH RETROGRADE PYELOGRAM, URETEROSCOPY AND STENT PLACEMENT  Right 03/13/2022   Procedure: CYSTOSCOPY WITH RETROGRADE PYELOGRAM, URETEROSCOPY AND STENT PLACEMENT;  Surgeon: Noel Christmas, MD;  Location: [facility];  Service: Urology;  Laterality: Right;  75 MINS   CYSTOSCOPY WITH RETROGRADE PYELOGRAM, URETEROSCOPY AND STENT PLACEMENT Right 08/07/2022   Procedure: CYSTOSCOPY WITH RETROGRADE PYELOGRAM, URETEROSCOPY AND STENT PLACEMENT, REMOVAL OF NEPHROURETERAL STENT;  Surgeon: Noel Christmas, MD;  Location: [facility];  Service: Urology;  Laterality: Right;  75 MINS   CYSTOSCOPY WITH URETHRAL DILATATION N/A 08/23/2020   Procedure: CYSTOSCOPY WITH URETHRAL DILATATION, BLADDER BIOPSY, FULGERATION;  Surgeon: Noel Christmas, MD;  Location: [facility] [place];  Service: Urology;  Laterality: N/A;  1 HR   CYSTOSCOPY WITH URETHRAL DILATATION N/A 03/13/2021   Procedure: CYSTOSCOPY WITH URETHRAL BALLOON DILATATION/ FOLEY CATHETER PLACEMENT;  Surgeon: Noel Christmas, MD;  [facility];  Service: Urology;  Laterality: N/A;   CYSTOSCOPY WITH URETHRAL DILATATION Left 07/08/2009   @[facility] :   with left  RTG   CYSTOSCOPY WITH URETHRAL DILATATION N/A 06/19/2022   Procedure: CYSTOSCOPY WITH OPTILUME URETHRAL DILATATION;  Surgeon: Noel Christmas, MD;  Location[facility] ;  Service: Urology;  Laterality:  N/A;   CYSTOSCOPY/URETEROSCOPY/HOLMIUM LASER/STENT PLACEMENT Bilateral 01/29/2023   Procedure: DIAGNOSTIC CYSTOSCOPY , LEFT RETROGRADE PYELOGRAM, LEFT URETERAL STENT PLACEMENT, AND STONE BASKETING;  Surgeon: Noel Christmas, MD;  Location: [facility] [place];  Service: Urology;  Laterality: Bilateral;   HOLMIUM LASER APPLICATION N/A 08/23/2020   Procedure: HOLMIUM LASER APPLICATION OF URETHRAL STONE;  Surgeon: Noel Christmas, MD;  Location: [facility];  Service: Urology;  Laterality: N/A;   HOLMIUM LASER APPLICATION Right 03/13/2022   Procedure: HOLMIUM LASER APPLICATION;  Surgeon: Noel Christmas, MD;  Location: [facility];  Service: Urology;  Laterality: Right;   HOLMIUM LASER APPLICATION Right 08/07/2022   Procedure: HOLMIUM LASER APPLICATION;  Surgeon: Noel Christmas, MD;  [facility];  Service: Urology;  Laterality: Right;   INGUINAL HERNIA REPAIR Right 05/03/2015   @[facility]    INSERTION PROSTATE RADIATION SEED  03/04/2009   @[facility]    IR NEPHROSTOMY PLACEMENT RIGHT  07/18/2022   IR URETERAL STENT RIGHT NEW ACCESS W/O SEP NEPHROSTOMY CATH  02/04/2023   KNEE CARTILAGE SURGERY Left    1980S   LAPAROSCOPIC PARAESOPHAGEAL HERNIA REPAIR  02/11/2017   @[facility] ;  W/  NISSEN FUNDOPLICATION (MESH)   PROSTATE BIOPSY     TOE SURGERY     TRANSURETHRAL RESECTION OF BLADDER TUMOR N/A 01/02/2022   Procedure: TRANSURETHRAL RESECTION OF BLADDER TUMOR (TURBT);  Surgeon: Noel Christmas, MD;  Location: [facility];  Service: Urology;  Laterality: N/A;   TRANSURETHRAL RESECTION OF BLADDER TUMOR N/A 01/29/2023   Procedure: TRANSURETHRAL RESECTION OF BLADDER TUMOR (TURBT);  Surgeon: Noel Christmas, MD;  Location: [facility];  Service: Urology;  Laterality: N/A;    FAMILY HISTORY: History reviewed. No pertinent family history.  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 Social History   Socioeconomic History   Marital status: Married    Spouse name: Not on file   Number of children: Not on file   Years of education: Not on file   Highest education level: Not on file  Occupational History   Not on file  Tobacco Use   Smoking status: Former    Packs/day: 1.00    Years: 10.00    Additional pack years: 0.00    Total pack years: 10.00    Types: Cigarettes    Quit date: 03/02/1987    Years since quitting: 36.0   Smokeless tobacco: Never  Vaping Use   Vaping Use: Never used  Substance and Sexual Activity   Alcohol use: Yes    Comment: Rare   Drug use: Never    Sexual activity: Not on file  Other Topics Concern   Not on file  Social History Narrative   Not on file   Social Determinants of Health   Financial Resource Strain: Not on file  Food Insecurity: No Food Insecurity (02/26/2023)   Hunger Vital Sign    Worried About Running Out of Food in the Last Year: Never true    Ran Out of Food in the Last Year: Never true  Transportation Needs: No Transportation Needs (02/26/2023)   PRAPARE - Administrator, Civil Service (Medical): No    Lack of Transportation (Non-Medical): No  Physical Activity: Not on file  Stress: Not on file  Social Connections: Not on file  Intimate Partner Violence: Not At Risk (02/26/2023)   Humiliation, Afraid, Rape, and Kick questionnaire  Fear of Current or Ex-Partner: No    Emotionally Abused: No    Physically Abused: No    Sexually Abused: No    ALLERGIES: Sulfa antibiotics  MEDICATIONS:  Current Outpatient Medications  Medication Sig Dispense Refill   amLODipine (NORVASC) 5 MG tablet Take 5 mg by mouth at bedtime.     celecoxib (CELEBREX) 100 MG capsule Take 100 mg by mouth at bedtime.     ciclopirox (LOPROX) 0.77 % cream Apply 1 application topically 2 (two) times daily as needed (dry/flaky skin).     Ciclopirox 1 % shampoo Apply 1 application topically daily as needed (dry/flaky skin).     doxepin (SINEQUAN) 25 MG capsule Take 25 mg by mouth at bedtime.     finasteride (PROSCAR) 5 MG tablet Take 5 mg by mouth daily.     fluticasone furoate-vilanterol (BREO ELLIPTA) 100-25 MCG/ACT AEPB TAKE 1 PUFF BY MOUTH EVERY DAY 60 each 3   levETIRAcetam (KEPPRA) 500 MG tablet Take 500 mg by mouth 3 (three) times daily.     losartan (COZAAR) 50 MG tablet Take 50 mg by mouth daily.     MYRBETRIQ 50 MG TB24 tablet Take 50 mg by mouth every evening.     OXcarbazepine ER (OXTELLAR XR) 600 MG TB24 Take 600 mg by mouth daily in the afternoon.     primidone (MYSOLINE) 50 MG tablet Take 75 mg by mouth at bedtime.      tamsulosin (FLOMAX) 0.4 MG CAPS capsule Take 1 capsule (0.4 mg total) by mouth daily. (Patient taking differently: Take 0.4 mg by mouth at bedtime.) 30 capsule 0   Testosterone 20.25 MG/ACT (1.62%) GEL Place 3 Pump onto the skin every evening.     Trospium Chloride 60 MG CP24 Take 1 capsule by mouth daily.     ustekinumab (STELARA) 90 MG/ML SOSY injection Inject 90 mg into the skin every 8 (eight) weeks.     No current facility-administered medications for this encounter.    REVIEW OF SYSTEMS:  On review of systems, the patient reports that he is doing well overall. He denies any chest pain, shortness of breath, cough, fevers, chills, night sweats. He reports a weight loss of 6-10 lbs over the last 6 months due to lack of appetite. He denies any bowel disturbances, and denies abdominal pain, nausea or vomiting.  He does have a history of ulcerative colitis that has been well-controlled/in remission on Stelara.  His IPSS score was 27, indicating severe urinary symptoms. He endorses wearing Depends undergarments as protective barrier due to difficulty controlling urine flow. He denies any new musculoskeletal or joint aches or pains. He also notes new occasional loss of balance over the past week or so, denies any falls. A complete review of systems is obtained and is otherwise negative.    PHYSICAL EXAM:  Wt Readings from Last 3 Encounters:  02/26/23 139 lb 6 oz (63.2 kg)  02/22/23 141 lb 11.2 oz (64.3 kg)  02/04/23 140 lb 9.6 oz (63.8 kg)   Temp Readings from Last 3 Encounters:  02/26/23 97.6 F (36.4 C) (Temporal)  02/22/23 97.7 F (36.5 C) (Temporal)  02/04/23 99.5 F (37.5 C) (Oral)   BP Readings from Last 3 Encounters:  02/26/23 (!) 147/82  02/22/23 (!) 176/87  02/04/23 109/71   Pulse Readings from Last 3 Encounters:  02/26/23 85  02/22/23 82  02/04/23 99   Pain Assessment Pain Score: 0-No pain/10  In general this is a well appearing Caucasian man in no  acute distress.  He's alert and oriented x4 and appropriate throughout the examination. Cardiopulmonary assessment is negative for acute distress and he exhibits normal effort.     KPS = 100  100 - Normal; no complaints; no evidence of disease. 90   - Able to carry on normal activity; minor signs or symptoms of disease. 80   - Normal activity with effort; some signs or symptoms of disease. 5   - Cares for self; unable to carry on normal activity or to do active work. 60   - Requires occasional assistance, but is able to care for most of his personal needs. 50   - Requires considerable assistance and frequent medical care. 40   - Disabled; requires special care and assistance. 30   - Severely disabled; hospital admission is indicated although death not imminent. 20   - Very sick; hospital admission necessary; active supportive treatment necessary. 10   - Moribund; fatal processes progressing rapidly. 0     - Dead  Karnofsky DA, Abelmann WH, Craver LS and Burchenal JH [phone]) The use of the nitrogen mustards in the palliative treatment of carcinoma: with particular reference to bronchogenic carcinoma Cancer 1 634-56  LABORATORY DATA:  Lab Results  Component Value Date   WBC 6.6 02/22/2023   HGB 13.1 02/22/2023   HCT 38.8 (L) 02/22/2023   MCV 94.9 02/22/2023   PLT 222 02/22/2023   Lab Results  Component Value Date   NA 139 02/22/2023   K 4.8 02/22/2023   CL 108 02/22/2023   CO2 25 02/22/2023   Lab Results  Component Value Date   ALT 9 02/22/2023   AST 16 02/22/2023   ALKPHOS 85 02/22/2023   BILITOT 0.4 02/22/2023     RADIOGRAPHY: IR URETERAL STENT RIGHT NEW ACCESS W/O SEP NEPHROSTOMY CATH  Result Date: 02/04/2023 INDICATION: Hydronephrosis, distal right UVJ obstruction, prostate cancer EXAM: ULTRASOUND AND FLUOROSCOPIC 10 FRENCH RIGHT NEPHROSTOMY COMPARISON:  01/11/2023 CT MEDICATIONS: 2 g Rocephin; The antibiotic was administered in an appropriate time frame prior to skin puncture.  ANESTHESIA/SEDATION: Moderate (conscious) sedation was employed during this procedure. A total of Versed 2.0 mg and Fentanyl 0 100 mcg was administered intravenously by the radiology nurse. Total intra-service moderate Sedation Time: 16 minutes. The patient's level of consciousness and vital signs were monitored continuously by radiology nursing throughout the procedure under my direct supervision. CONTRAST:  10 cc-administered into the collecting system(s) FLUOROSCOPY: Radiation Exposure Index (as provided by the fluoroscopic device): 32 mGy Kerma COMPLICATIONS: None immediate. PROCEDURE: Informed written consent was obtained from the patient after a thorough discussion of the procedural risks, benefits and alternatives. All questions were addressed. Maximal Sterile Barrier Technique was utilized including caps, mask, sterile gowns, sterile gloves, sterile drape, hand hygiene and skin antiseptic. A timeout was performed prior to the initiation of the procedure. Previous imaging reviewed. Patient position prone. Hydronephrotic kidney was localized between the 11 and 12 rib for posterolateral access. Under sterile conditions and local anesthesia, an 18 gauge 15 cm needle was advanced into a dilated posterior calyx. Needle position confirmed with ultrasound. Images obtained for documentation. There was return of blood tinged urine. Amplatz guidewire inserted. Five [place] Kumpe catheter advanced. Catheter advanced into the proximal ureter. Contrast injection demonstrates diffuse nonobstructing filling defects throughout the collecting system compatible with blood related to the puncture/needle access. Kumpe catheter advanced to the right UVJ over an Amplatz guidewire. Contrast injection confirms right UVJ obstruction. Glidewire would not pass through the obstruction. Right distal  UVJ obstruction feels firm with the wire and catheter. Because of the collecting system hemorrhage, the access was retracted to the renal  pelvis. Ten French nephrostomy advanced over the guidewire. Retention loop formed in the renal pelvis. Contrast injection confirms position. Images obtained for documentation. Access secured with Prolene suture and sterile dressing. Gravity drainage bag connected. IMPRESSION: 1. Successful right percutaneous nephrostomy tube placement under ultrasound and fluoroscopic guidance. 2. Right distal UVJ obstruction, unable to cross with catheter and guidewire access for nephroureteral placement today. Electronically Signed   By: Judie Petit.  Shick M.D.   On: 02/04/2023 10:19      IMPRESSION/PLAN: 1. 72 y.o. man with stage T2, N0 muscle-invasive papillary urothelial carcinoma of bladder neck, s/p TURBT 01/29/23.  Today, we talked to the patient about the findings and workup thus far. We discussed the natural history of muscle-invasive bladder cancer and general treatment, highlighting the role of radiotherapy in non-surgical management. We discussed the available radiation techniques, and focused on the details and logistics of delivery.  We discussed that cystoprostatectomy is the gold standard of treatment but, if for any reason, he is not felt to be a surgical candidate, we could safely offer bladder sparing definitive treatment with chemoradiation although the radiation field would likely be somewhat limited given his history of full-dose pelvic radiation previously with the treatment of his prostate cancer.  He is also not a candidate for cisplatin based chemotherapy due to his significant renal impairment so the concurrent systemic treatment options would be limited.  We also favor cystoprostatectomy in his case given his previous history of radiation complications from scarring and his history of ulcerative colitis.  We reviewed the anticipated acute and late sequelae associated with radiation in this setting. The patient was encouraged to ask questions that were answered to his stated satisfaction.  At the end of our  conversation, the patient would like to proceed with radical cystoprostatectomy so we will share our discussion with Dr. Arita Miss and Dr. Shirline Frees so that they can proceed with treatment planning accordingly.  We enjoyed meeting with him today and look forward to following his progress.  Of course, if there is any clinical indication for radiotherapy in the future, we would be more than happy to continue to participate in his care.  He knows that he is welcome to call anytime with any questions or concerns related to the treatment options discussed today.  We personally spent 70 minutes in this encounter including chart review, reviewing radiological studies, meeting face-to-face with the patient, entering orders and completing documentation.    Marguarite Arbour, PA-C    Margaretmary Dys, MD  [facility]  Radiation Oncology Direct Dial: [phone]  Fax: [phone] [email or web address]  Skype  [email or web address]   This document serves as a record of services personally performed by Margaretmary Dys, MD and Marcello Fennel, PA-C. It was created on their behalf by Mickie Bail, a trained medical scribe. The creation of this record is based on the scribe's personal observations and the provider's statements to them. This document has been checked and approved by the attending provider.

## 2023-02-27 ENCOUNTER — Inpatient Hospital Stay: Payer: Medicare Other | Admitting: Internal Medicine

## 2023-02-27 ENCOUNTER — Inpatient Hospital Stay: Payer: Medicare Other

## 2023-03-05 DIAGNOSIS — Z4802 Encounter for removal of sutures: Secondary | ICD-10-CM | POA: Diagnosis not present

## 2023-03-05 DIAGNOSIS — M25511 Pain in right shoulder: Secondary | ICD-10-CM | POA: Diagnosis not present

## 2023-03-07 DIAGNOSIS — D04121 Carcinoma in situ of skin of left upper eyelid, including canthus: Secondary | ICD-10-CM | POA: Diagnosis not present

## 2023-03-08 DIAGNOSIS — I129 Hypertensive chronic kidney disease with stage 1 through stage 4 chronic kidney disease, or unspecified chronic kidney disease: Secondary | ICD-10-CM | POA: Diagnosis not present

## 2023-03-08 DIAGNOSIS — N1832 Chronic kidney disease, stage 3b: Secondary | ICD-10-CM | POA: Diagnosis not present

## 2023-03-08 DIAGNOSIS — D04121 Carcinoma in situ of skin of left upper eyelid, including canthus: Secondary | ICD-10-CM | POA: Diagnosis not present

## 2023-03-08 DIAGNOSIS — D0410 Carcinoma in situ of skin of unspecified eyelid, including canthus: Secondary | ICD-10-CM | POA: Diagnosis not present

## 2023-03-08 DIAGNOSIS — H0289 Other specified disorders of eyelid: Secondary | ICD-10-CM | POA: Diagnosis not present

## 2023-03-08 DIAGNOSIS — C441291 Squamous cell carcinoma of skin of left upper eyelid, including canthus: Secondary | ICD-10-CM | POA: Diagnosis not present

## 2023-03-14 DIAGNOSIS — H0289 Other specified disorders of eyelid: Secondary | ICD-10-CM | POA: Diagnosis not present

## 2023-03-18 DIAGNOSIS — N200 Calculus of kidney: Secondary | ICD-10-CM | POA: Diagnosis not present

## 2023-03-18 DIAGNOSIS — N130 Hydronephrosis with ureteropelvic junction obstruction: Secondary | ICD-10-CM | POA: Diagnosis not present

## 2023-03-18 DIAGNOSIS — C670 Malignant neoplasm of trigone of bladder: Secondary | ICD-10-CM | POA: Diagnosis not present

## 2023-03-18 DIAGNOSIS — C61 Malignant neoplasm of prostate: Secondary | ICD-10-CM | POA: Diagnosis not present

## 2023-03-19 ENCOUNTER — Encounter (HOSPITAL_COMMUNITY): Payer: Self-pay

## 2023-03-19 ENCOUNTER — Other Ambulatory Visit: Payer: Self-pay | Admitting: Urology

## 2023-03-19 MED ORDER — MAGNESIUM CITRATE PO SOLN: 1.0000 | Freq: Once | ORAL | Status: DC

## 2023-03-21 ENCOUNTER — Other Ambulatory Visit: Payer: Self-pay | Admitting: Urology

## 2023-03-23 MED ORDER — MAGNESIUM CITRATE PO SOLN: 1.0000 | Freq: Once | ORAL | Status: DC

## 2023-03-23 NOTE — Progress Notes (Addendum)
COVID Vaccine received:  []  No [x]  Yes Date of any COVID positive Test in last 90 days:  none  PCP - Corwin Levins, MD Cardiologist - Vilinda Boehringer, MD (retired) but still goes to  [employer].  Neurology- Marlena Clipper, MD at [facility]  Chest x-ray - CT chest wo contrast: 10-11-22  Epic EKG -  01-29-2023 Epic Stress Test -  ECHO -  Cardiac Cath -   PCR screen: []  Ordered & Completed           []   No Order but Needs PROFEND           [x]   N/A for this surgery  Surgery Plan:  []  Ambulatory                            [x]  Outpatient in bed                            []  Admit  Anesthesia:    [x]  General  []  Spinal                           []   Choice []   MAC  Bowel Prep - []  No  [x]   Yes _Clear liquid diet the day before surgery. May continue until 06:30 am the DOS (Okayed with Christeen Douglas, PA  Pacemaker / ICD device [x]  No []  Yes   Spinal Cord Stimulator:[x]  No []  Yes       History of Sleep Apnea? [x]  No []  Yes   CPAP used?- [x]  No []  Yes    Does the patient monitor blood sugar?          []  No []  Yes  [x]  N/A  Patient has: [x]  NO Hx DM   []  Pre-DM                 []  DM1  []   DM2  Blood Thinner / Instructions:  none Aspirin Instructions:  none  ERAS Protocol Ordered: [x]  No  []  Yes Patient is to be NPO after: 0630 the DOS   Ok with [employer], PA  Activity level: Patient is able to climb a flight of stairs without difficulty; [x]  No CP  [x]  No SOB,   Patient can perform ADLs without assistance.   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     +Patient already has access for this surgery, no IR  Patient denies shortness of breath, fever, cough and chest pain at PAT appointment.  Patient verbalized understanding and agreement to the Pre-Surgical Instructions that were given to them at this PAT appointment. Patient was  also educated of the need to review these PAT instructions again prior to his surgery.I reviewed the appropriate phone numbers to call if they have any and questions or concerns.

## 2023-03-23 NOTE — Patient Instructions (Signed)
SURGICAL WAITING ROOM VISITATION Patients having surgery or a procedure may have no more than 2 support people in the waiting area - these visitors may rotate in the visitor waiting room.   Due to an increase in RSV and influenza rates and associated hospitalizations, children ages 35 and under may not visit patients in [facility] hospitals. If the patient needs to stay at the hospital during part of their recovery, the visitor guidelines for inpatient rooms apply.  PRE-OP VISITATION  Pre-op nurse will coordinate an appropriate time for 1 support person to accompany the patient in pre-op.  This support person may not rotate.  This visitor will be contacted when the time is appropriate for the visitor to come back in the pre-op area.  Please refer to the [facility] website for the visitor guidelines for Inpatients (after your surgery is over and you are in a regular room).  You are not required to quarantine at this time prior to your surgery. However, you must do this: Hand Hygiene often Do NOT share personal items Notify your provider if you are in close contact with someone who has COVID or you develop fever 100.4 or greater, new onset of sneezing, cough, sore throat, shortness of breath or body aches.  If you test positive for Covid or have been in contact with anyone that has tested positive in the last 10 days please notify you surgeon.    Your procedure is scheduled on:  Friday  March 29, 2023  Report to [facility] Main Entrance: [place] entrance where the [employer] is available.   Report to admitting at: 1:00  PM  +++++Call this number if you have any questions or problems the morning of surgery [phone]  DO NOT EAT OR DRINK ANYTHING AFTER MIDNIGHT TO YOUR SURGERY / PROCEDURE.   Clear Liquid Diet the day before your surgery.   You may continue this until 06:30 am the morning of your surgery.   FOLLOW BOWEL PREP AND ANY ADDITIONAL PRE OP INSTRUCTIONS YOU  RECEIVED FROM YOUR SURGEON'S OFFICE!!!   Oral Hygiene is also important to reduce your risk of infection.        Remember - BRUSH YOUR TEETH THE MORNING OF SURGERY WITH YOUR REGULAR TOOTHPASTE  Do NOT smoke after Midnight the night before surgery.  Take ONLY these medicines the morning of surgery with A SIP OF WATER: cephalexin (Keflex), Levetiracetam (Keppra).  You may use your Eye drops and Breo Ellipta inhaler.                     You may not have any metal on your body including  jewelry, and body piercing  Do not wear  lotions, powders, cologne, or deodorant  Men may shave face and neck.  Contacts, Hearing Aids, dentures or bridgework may not be worn into surgery. DENTURES WILL BE REMOVED PRIOR TO SURGERY PLEASE DO NOT APPLY "Poly grip" OR ADHESIVES!!!  You may bring a small overnight bag with you on the day of surgery, only pack items that are not valuable. [facility] IS NOT RESPONSIBLE   FOR VALUABLES THAT ARE LOST OR STOLEN.   Do not bring your home medications to the hospital. The Pharmacy will dispense medications listed on your medication list to you during your admission in the Hospital.  Special Instructions: Bring a copy of your healthcare power of attorney and living will documents the day of surgery, if you wish to have them scanned into your  [facility] Medical Records- EPIC  Please read over the following fact sheets you were given: IF YOU HAVE QUESTIONS ABOUT YOUR PRE-OP INSTRUCTIONS, PLEASE CALL [phone].   [facility] - Preparing for Surgery Before surgery, you can play an important role.  Because skin is not sterile, your skin needs to be as free of germs as possible.  You can reduce the number of germs on your skin by washing with CHG (chlorahexidine gluconate) soap before surgery.  CHG is an antiseptic cleaner which kills germs and bonds with the skin to continue killing germs even after washing. Please DO NOT use if you have an allergy to CHG or antibacterial  soaps.  If your skin becomes reddened/irritated stop using the CHG and inform your nurse when you arrive at Short Stay. Do not shave (including legs and underarms) for at least 48 hours prior to the first CHG shower.  You may shave your face/neck.  Please follow these instructions carefully:  1.  Shower with CHG Soap the night before surgery and the  morning of surgery.  2.  If you choose to wash your hair, wash your hair first as usual with your normal  shampoo.  3.  After you shampoo, rinse your hair and body thoroughly to remove the shampoo.                             4.  Use CHG as you would any other liquid soap.  You can apply chg directly to the skin and wash.  Gently with a scrungie or clean washcloth.  5.  Apply the CHG Soap to your body ONLY FROM THE NECK DOWN.   Do not use on face/ open                           Wound or open sores. Avoid contact with eyes, ears mouth and genitals (private parts).                       Wash face,  Genitals (private parts) with your normal soap.             6.  Wash thoroughly, paying special attention to the area where your  surgery  will be performed.  7.  Thoroughly rinse your body with warm water from the neck down.  8.  DO NOT shower/wash with your normal soap after using and rinsing off the CHG Soap.            9.  Pat yourself dry with a clean towel.            10.  Wear clean pajamas.            11.  Place clean sheets on your bed the night of your first shower and do not  sleep with pets.  ON THE DAY OF SURGERY : Do not apply any lotions/deodorants the morning of surgery.  Please wear clean clothes to the hospital/surgery center.    FAILURE TO FOLLOW THESE INSTRUCTIONS MAY RESULT IN THE CANCELLATION OF YOUR SURGERY  PATIENT SIGNATURE_________________________________  NURSE SIGNATURE__________________________________  ________________________________________________________________________

## 2023-03-25 ENCOUNTER — Encounter (HOSPITAL_COMMUNITY): Payer: Self-pay

## 2023-03-25 ENCOUNTER — Other Ambulatory Visit: Payer: Self-pay

## 2023-03-25 ENCOUNTER — Encounter (HOSPITAL_COMMUNITY)
Admission: RE | Admit: 2023-03-25 | Discharge: 2023-03-25 | Disposition: A | Discharge status: Home / Self Care | Payer: Medicare Other | Source: Ambulatory Visit | Attending: Urology | Admitting: Urology

## 2023-03-25 VITALS — BP 125/68 | HR 72 | Temp 99.4°F | Resp 18 | Ht 68.0 in | Wt 134.0 lb

## 2023-03-25 DIAGNOSIS — Z01812 Encounter for preprocedural laboratory examination: Secondary | ICD-10-CM | POA: Insufficient documentation

## 2023-03-25 DIAGNOSIS — I10 Essential (primary) hypertension: Secondary | ICD-10-CM | POA: Insufficient documentation

## 2023-03-25 LAB — CBC
HCT: 39.6 % (ref 39.0–52.0)
Hemoglobin: 13 g/dL (ref 13.0–17.0)
MCH: 31.6 pg (ref 26.0–34.0)
MCHC: 32.8 g/dL (ref 30.0–36.0)
MCV: 96.4 fL (ref 80.0–100.0)
Platelets: 213 10*3/uL (ref 150–400)
RBC: 4.11 MIL/uL — ABNORMAL LOW (ref 4.22–5.81)
RDW: 13.1 % (ref 11.5–15.5)
WBC: 6.4 10*3/uL (ref 4.0–10.5)
nRBC: 0 % (ref 0.0–0.2)

## 2023-03-25 LAB — BASIC METABOLIC PANEL
Anion gap: 7 (ref 5–15)
BUN: 47 mg/dL — ABNORMAL HIGH (ref 8–23)
CO2: 23 mmol/L (ref 22–32)
Calcium: 9 mg/dL (ref 8.9–10.3)
Chloride: 108 mmol/L (ref 98–111)
Creatinine, Ser: 2.28 mg/dL — ABNORMAL HIGH (ref 0.61–1.24)
GFR, Estimated: 30 mL/min — ABNORMAL LOW (ref >60)
Glucose, Bld: 145 mg/dL — ABNORMAL HIGH (ref 70–99)
Potassium: 4.4 mmol/L (ref 3.5–5.1)
Sodium: 138 mmol/L (ref 135–145)

## 2023-03-29 ENCOUNTER — Encounter (HOSPITAL_COMMUNITY)
Admission: RE | Disposition: A | Discharge status: Home / Self Care | Payer: Self-pay | Source: Ambulatory Visit | Attending: Urology

## 2023-03-29 ENCOUNTER — Observation Stay (HOSPITAL_COMMUNITY)
Admission: RE | Admit: 2023-03-29 | Discharge: 2023-03-31 | Disposition: A | Discharge status: Home / Self Care | Payer: Medicare Other | Source: Ambulatory Visit | Attending: Urology | Admitting: Urology

## 2023-03-29 ENCOUNTER — Encounter (HOSPITAL_COMMUNITY): Payer: Self-pay | Admitting: Urology

## 2023-03-29 ENCOUNTER — Ambulatory Visit (HOSPITAL_BASED_OUTPATIENT_CLINIC_OR_DEPARTMENT_OTHER): Payer: Medicare Other | Admitting: Certified Registered"

## 2023-03-29 ENCOUNTER — Other Ambulatory Visit: Payer: Self-pay

## 2023-03-29 ENCOUNTER — Ambulatory Visit (HOSPITAL_COMMUNITY): Payer: Medicare Other

## 2023-03-29 ENCOUNTER — Ambulatory Visit (HOSPITAL_COMMUNITY): Payer: Medicare Other | Admitting: Certified Registered"

## 2023-03-29 DIAGNOSIS — C679 Malignant neoplasm of bladder, unspecified: Secondary | ICD-10-CM | POA: Insufficient documentation

## 2023-03-29 DIAGNOSIS — I10 Essential (primary) hypertension: Secondary | ICD-10-CM | POA: Diagnosis not present

## 2023-03-29 DIAGNOSIS — C61 Malignant neoplasm of prostate: Secondary | ICD-10-CM | POA: Insufficient documentation

## 2023-03-29 DIAGNOSIS — Z87891 Personal history of nicotine dependence: Secondary | ICD-10-CM

## 2023-03-29 DIAGNOSIS — N200 Calculus of kidney: Secondary | ICD-10-CM

## 2023-03-29 DIAGNOSIS — N35919 Unspecified urethral stricture, male, unspecified site: Secondary | ICD-10-CM | POA: Insufficient documentation

## 2023-03-29 DIAGNOSIS — C670 Malignant neoplasm of trigone of bladder: Secondary | ICD-10-CM | POA: Diagnosis not present

## 2023-03-29 DIAGNOSIS — N202 Calculus of kidney with calculus of ureter: Principal | ICD-10-CM | POA: Insufficient documentation

## 2023-03-29 DIAGNOSIS — N130 Hydronephrosis with ureteropelvic junction obstruction: Secondary | ICD-10-CM | POA: Diagnosis not present

## 2023-03-29 DIAGNOSIS — Z85828 Personal history of other malignant neoplasm of skin: Secondary | ICD-10-CM | POA: Insufficient documentation

## 2023-03-29 DIAGNOSIS — Z8546 Personal history of malignant neoplasm of prostate: Secondary | ICD-10-CM | POA: Diagnosis not present

## 2023-03-29 HISTORY — PX: NEPHROLITHOTOMY: SHX5134

## 2023-03-29 LAB — HEMOGLOBIN AND HEMATOCRIT, BLOOD
HCT: 37.4 % — ABNORMAL LOW (ref 39.0–52.0)
Hemoglobin: 12.2 g/dL — ABNORMAL LOW (ref 13.0–17.0)

## 2023-03-29 SURGERY — NEPHROLITHOTOMY PERCUTANEOUS
Anesthesia: General | Site: Back | Laterality: Right

## 2023-03-29 MED ORDER — FENTANYL CITRATE (PF) 100 MCG/2ML IJ SOLN
INTRAMUSCULAR | Status: AC
  Filled 2023-03-29: qty 2

## 2023-03-29 MED ORDER — ONDANSETRON HCL 4 MG/2ML IJ SOLN
INTRAMUSCULAR | Status: AC
  Filled 2023-03-29: qty 2

## 2023-03-29 MED ORDER — OXYCODONE HCL 5 MG PO TABS: 5.0000 mg | ORAL_TABLET | Freq: Once | ORAL | Status: DC | PRN

## 2023-03-29 MED ORDER — PROPOFOL 10 MG/ML IV BOLUS
INTRAVENOUS | Status: DC | PRN
  Administered 2023-03-29: 150 mg via INTRAVENOUS

## 2023-03-29 MED ORDER — ACETAMINOPHEN 500 MG PO TABS
1000.0000 mg | ORAL_TABLET | Freq: Three times a day (TID) | ORAL | Status: DC
  Administered 2023-03-29 – 2023-03-31 (×4): 1000 mg via ORAL
  Filled 2023-03-29 (×5): qty 2

## 2023-03-29 MED ORDER — SENNOSIDES-DOCUSATE SODIUM 8.6-50 MG PO TABS: 1.0000 | ORAL_TABLET | Freq: Two times a day (BID) | ORAL | 0 refills | Status: DC

## 2023-03-29 MED ORDER — DOXEPIN HCL 25 MG PO CAPS
25.0000 mg | ORAL_CAPSULE | Freq: Every day | ORAL | Status: DC
  Administered 2023-03-29 – 2023-03-30 (×2): 25 mg via ORAL
  Filled 2023-03-29 (×2): qty 1

## 2023-03-29 MED ORDER — ONDANSETRON HCL 4 MG/2ML IJ SOLN
INTRAMUSCULAR | Status: DC | PRN
  Administered 2023-03-29: 4 mg via INTRAVENOUS

## 2023-03-29 MED ORDER — FENTANYL CITRATE PF 50 MCG/ML IJ SOSY: 25.0000 ug | PREFILLED_SYRINGE | INTRAMUSCULAR | Status: DC | PRN

## 2023-03-29 MED ORDER — LACTATED RINGERS IV SOLN: INTRAVENOUS | Status: DC

## 2023-03-29 MED ORDER — LIDOCAINE 2% (20 MG/ML) 5 ML SYRINGE
INTRAMUSCULAR | Status: DC | PRN
  Administered 2023-03-29: 100 mg via INTRAVENOUS

## 2023-03-29 MED ORDER — SUGAMMADEX SODIUM 200 MG/2ML IV SOLN
INTRAVENOUS | Status: DC | PRN
  Administered 2023-03-29: 180 mg via INTRAVENOUS

## 2023-03-29 MED ORDER — ROCURONIUM BROMIDE 10 MG/ML (PF) SYRINGE
PREFILLED_SYRINGE | INTRAVENOUS | Status: AC
  Filled 2023-03-29: qty 10

## 2023-03-29 MED ORDER — ONDANSETRON HCL 4 MG/2ML IJ SOLN: 4.0000 mg | Freq: Once | INTRAMUSCULAR | Status: DC | PRN

## 2023-03-29 MED ORDER — FLUTICASONE FUROATE-VILANTEROL 100-25 MCG/ACT IN AEPB
1.0000 | INHALATION_SPRAY | Freq: Every day | RESPIRATORY_TRACT | Status: DC
  Administered 2023-03-30 – 2023-03-31 (×2): 1 via RESPIRATORY_TRACT
  Filled 2023-03-29: qty 28

## 2023-03-29 MED ORDER — CHLORHEXIDINE GLUCONATE 0.12 % MT SOLN
15.0000 mL | Freq: Once | OROMUCOSAL | Status: AC
  Administered 2023-03-29: 15 mL via OROMUCOSAL

## 2023-03-29 MED ORDER — PRIMIDONE 50 MG PO TABS
100.0000 mg | ORAL_TABLET | Freq: Every day | ORAL | Status: DC
  Administered 2023-03-29 – 2023-03-30 (×2): 100 mg via ORAL
  Filled 2023-03-29 (×2): qty 2

## 2023-03-29 MED ORDER — DEXAMETHASONE SODIUM PHOSPHATE 10 MG/ML IJ SOLN
INTRAMUSCULAR | Status: DC | PRN
  Administered 2023-03-29: 8 mg via INTRAVENOUS

## 2023-03-29 MED ORDER — 0.9 % SODIUM CHLORIDE (POUR BTL) OPTIME
TOPICAL | Status: DC | PRN
  Administered 2023-03-29: 1000 mL

## 2023-03-29 MED ORDER — FERROUS SULFATE 325 (65 FE) MG PO TABS
325.0000 mg | ORAL_TABLET | Freq: Every day | ORAL | Status: DC
  Administered 2023-03-29 – 2023-03-31 (×3): 325 mg via ORAL
  Filled 2023-03-29 (×3): qty 1

## 2023-03-29 MED ORDER — DEXAMETHASONE SODIUM PHOSPHATE 10 MG/ML IJ SOLN
INTRAMUSCULAR | Status: AC
  Filled 2023-03-29: qty 1

## 2023-03-29 MED ORDER — AMLODIPINE BESYLATE 5 MG PO TABS
5.0000 mg | ORAL_TABLET | Freq: Every day | ORAL | Status: DC
  Administered 2023-03-29 – 2023-03-30 (×2): 5 mg via ORAL
  Filled 2023-03-29 (×2): qty 1

## 2023-03-29 MED ORDER — FENTANYL CITRATE (PF) 100 MCG/2ML IJ SOLN
INTRAMUSCULAR | Status: DC | PRN
  Administered 2023-03-29: 50 ug via INTRAVENOUS

## 2023-03-29 MED ORDER — ROCURONIUM BROMIDE 10 MG/ML (PF) SYRINGE
PREFILLED_SYRINGE | INTRAVENOUS | Status: DC | PRN
  Administered 2023-03-29: 70 mg via INTRAVENOUS

## 2023-03-29 MED ORDER — SODIUM CHLORIDE 0.9 % IV SOLN
2.0000 g | INTRAVENOUS | Status: AC
  Administered 2023-03-29: 2 g via INTRAVENOUS
  Filled 2023-03-29: qty 20

## 2023-03-29 MED ORDER — OXCARBAZEPINE ER 600 MG PO TB24: 600.0000 mg | ORAL_TABLET | Freq: Every day | ORAL | Status: DC

## 2023-03-29 MED ORDER — OXYCODONE HCL 5 MG/5ML PO SOLN: 5.0000 mg | Freq: Once | ORAL | Status: DC | PRN

## 2023-03-29 MED ORDER — SODIUM CHLORIDE 0.9 % IR SOLN
Status: DC | PRN
  Administered 2023-03-29: 6000 mL

## 2023-03-29 MED ORDER — OXYCODONE-ACETAMINOPHEN 5-325 MG PO TABS: 1.0000 | ORAL_TABLET | Freq: Four times a day (QID) | ORAL | 0 refills | Status: DC | PRN

## 2023-03-29 MED ORDER — IOHEXOL 300 MG/ML  SOLN
INTRAMUSCULAR | Status: DC | PRN
  Administered 2023-03-29: 25 mL

## 2023-03-29 MED ORDER — PHENYLEPHRINE HCL-NACL 20-0.9 MG/250ML-% IV SOLN
INTRAVENOUS | Status: DC | PRN
  Administered 2023-03-29: 25 ug/min via INTRAVENOUS

## 2023-03-29 MED ORDER — PHENYLEPHRINE 80 MCG/ML (10ML) SYRINGE FOR IV PUSH (FOR BLOOD PRESSURE SUPPORT)
PREFILLED_SYRINGE | INTRAVENOUS | Status: DC | PRN
  Administered 2023-03-29: 80 ug via INTRAVENOUS

## 2023-03-29 MED ORDER — ORAL CARE MOUTH RINSE: 15.0000 mL | Freq: Once | OROMUCOSAL | Status: AC

## 2023-03-29 MED ORDER — SODIUM CHLORIDE 0.9 % IV SOLN: INTRAVENOUS | Status: DC

## 2023-03-29 MED ORDER — PROPOFOL 10 MG/ML IV BOLUS
INTRAVENOUS | Status: AC
  Filled 2023-03-29: qty 20

## 2023-03-29 MED ORDER — OXYCODONE HCL 5 MG PO TABS
5.0000 mg | ORAL_TABLET | ORAL | Status: DC | PRN
  Administered 2023-03-30: 5 mg via ORAL
  Filled 2023-03-29: qty 1

## 2023-03-29 MED ORDER — SENNOSIDES-DOCUSATE SODIUM 8.6-50 MG PO TABS
2.0000 | ORAL_TABLET | Freq: Every day | ORAL | Status: DC
  Administered 2023-03-29 – 2023-03-30 (×2): 2 via ORAL
  Filled 2023-03-29 (×2): qty 2

## 2023-03-29 SURGICAL SUPPLY — 77 items
AGENT HMST KT MTR STRL THRMB (HEMOSTASIS)
APL PRP STRL LF DISP 70% ISPRP (MISCELLANEOUS) ×2
APL SKNCLS STERI-STRIP NONHPOA (GAUZE/BANDAGES/DRESSINGS) ×2
BAG COUNTER SPONGE SURGICOUNT (BAG) IMPLANT
BAG DRN RND TRDRP ANRFLXCHMBR (UROLOGICAL SUPPLIES)
BAG SPNG CNTER NS LX DISP (BAG)
BAG URINE DRAIN 2000ML AR STRL (UROLOGICAL SUPPLIES) IMPLANT
BAG URO CATCHER STRL LF (MISCELLANEOUS) IMPLANT
BASKET LASER NITINOL 1.9FR (BASKET) IMPLANT
BASKET ZERO TIP NITINOL 2.4FR (BASKET) IMPLANT
BENZOIN TINCTURE PRP APPL 2/3 (GAUZE/BANDAGES/DRESSINGS) ×2 IMPLANT
BLADE SURG 15 STRL LF DISP TIS (BLADE) ×2 IMPLANT
BLADE SURG 15 STRL SS (BLADE) ×2
BSKT STON RTRVL 120 1.9FR (BASKET)
BSKT STON RTRVL ZERO TP 2.4FR (BASKET)
CATH FOLEY 2W COUNCIL 20FR 5CC (CATHETERS) IMPLANT
CATH FOLEY 2WAY SLVR 5CC 16FR (CATHETERS) ×2 IMPLANT
CATH MULTI PURPOSE 16FR DRAIN (CATHETERS) IMPLANT
CATH ROBINSON RED A/P 20FR (CATHETERS) IMPLANT
CATH ULTRATHANE 10.2FR (CATHETERS) ×1 IMPLANT
CATH ULTRATHANE 14FR (CATHETERS) IMPLANT
CATH URETL OPEN END 6FR 70 (CATHETERS) IMPLANT
CATH UROLOGY TORQUE 40 (MISCELLANEOUS) ×1 IMPLANT
CATH X-FORCE N30 NEPHROSTOMY (TUBING) IMPLANT
CHLORAPREP W/TINT 26 (MISCELLANEOUS) ×3 IMPLANT
COVER BACK TABLE 60X90IN (DRAPES) ×2 IMPLANT
DRAPE C-ARM 42X120 X-RAY (DRAPES) ×2 IMPLANT
DRAPE LINGEMAN PERC (DRAPES) ×2 IMPLANT
DRAPE SHEET LG 3/4 BI-LAMINATE (DRAPES) IMPLANT
DRAPE SURG IRRIG POUCH 19X23 (DRAPES) ×2 IMPLANT
DRSG TEGADERM 4X4.75 (GAUZE/BANDAGES/DRESSINGS) IMPLANT
DRSG TEGADERM 6X8 (GAUZE/BANDAGES/DRESSINGS) ×1 IMPLANT
DRSG TEGADERM 8X12 (GAUZE/BANDAGES/DRESSINGS) ×4 IMPLANT
GAUZE PAD ABD 8X10 STRL (GAUZE/BANDAGES/DRESSINGS) ×4 IMPLANT
GAUZE SPONGE 4X4 12PLY STRL (GAUZE/BANDAGES/DRESSINGS) ×2 IMPLANT
GLOVE SURG LX STRL 7.5 STRW (GLOVE) ×2 IMPLANT
GOWN STRL REUS W/ TWL XL LVL3 (GOWN DISPOSABLE) ×2 IMPLANT
GOWN STRL REUS W/TWL XL LVL3 (GOWN DISPOSABLE) ×2
GUIDEWIRE AMPLAZ .035X145 (WIRE) ×4 IMPLANT
GUIDEWIRE ANG ZIPWIRE 038X150 (WIRE) ×2 IMPLANT
GUIDEWIRE STR DUAL SENSOR (WIRE) IMPLANT
IV SET EXTENSION CATH 6 NF (IV SETS) IMPLANT
KIT BASIN OR (CUSTOM PROCEDURE TRAY) ×2 IMPLANT
KIT PROBE 340X3.4XDISP GRN (MISCELLANEOUS) IMPLANT
KIT PROBE TRILOGY 3.4X340 (MISCELLANEOUS)
KIT PROBE TRILOGY 3.9X350 (MISCELLANEOUS) IMPLANT
KIT TURNOVER KIT A (KITS) IMPLANT
LASER FIB FLEXIVA PULSE ID 365 (Laser) IMPLANT
LASER FIB FLEXIVA PULSE ID 550 (Laser) IMPLANT
LASER FIB FLEXIVA PULSE ID 910 (Laser) IMPLANT
LUBRICANT JELLY K Y 4OZ (MISCELLANEOUS) ×2 IMPLANT
MANIFOLD NEPTUNE II (INSTRUMENTS) ×2 IMPLANT
NDL TROCAR 18X15 ECHO (NEEDLE) IMPLANT
NDL TROCAR 18X20 (NEEDLE) IMPLANT
NEEDLE TROCAR 18X15 ECHO (NEEDLE) IMPLANT
NEEDLE TROCAR 18X20 (NEEDLE) IMPLANT
NS IRRIG 1000ML POUR BTL (IV SOLUTION) ×2 IMPLANT
PACK CYSTO (CUSTOM PROCEDURE TRAY) IMPLANT
SHEATH PEELAWAY SET 9 (SHEATH) ×1 IMPLANT
SPONGE T-LAP 4X18 ~~LOC~~+RFID (SPONGE) ×2 IMPLANT
SURGIFLO W/THROMBIN 8M KIT (HEMOSTASIS) IMPLANT
SUT SILK 2 0 30 PSL (SUTURE) ×2 IMPLANT
SUT VIC AB 2-0 CT1 27 (SUTURE)
SUT VIC AB 2-0 CT1 TAPERPNT 27 (SUTURE) IMPLANT
SYR 10ML LL (SYRINGE) ×2 IMPLANT
SYR 20ML LL LF (SYRINGE) ×4 IMPLANT
SYR 50ML LL SCALE MARK (SYRINGE) ×2 IMPLANT
TOWEL OR 17X26 10 PK STRL BLUE (TOWEL DISPOSABLE) ×2 IMPLANT
TRACTIP FLEXIVA PULS ID 200XHI (Laser) IMPLANT
TRACTIP FLEXIVA PULSE ID 200 (Laser)
TRAY FOLEY MTR SLVR 16FR STAT (SET/KITS/TRAYS/PACK) ×2 IMPLANT
TUBE CONNECTING VINYL 14FR 30C (TUBING) IMPLANT
TUBING CONNECTING 10 (TUBING) ×4 IMPLANT
TUBING STONE CATCHER TRILOGY (MISCELLANEOUS) IMPLANT
TUBING UROLOGY SET (TUBING) IMPLANT
WATER STERILE IRR 1000ML POUR (IV SOLUTION) ×2 IMPLANT
WATER STERILE IRR 3000ML UROMA (IV SOLUTION) ×2 IMPLANT

## 2023-03-29 NOTE — Anesthesia Procedure Notes (Signed)
Procedure Name: Intubation Date/Time: 03/29/2023 4:40 PM  Performed by: Nelle Don, CRNAPre-anesthesia Checklist: Patient identified, Emergency Drugs available, Suction available and Patient being monitored Patient Re-evaluated:Patient Re-evaluated prior to induction Oxygen Delivery Method: Circle system utilized Preoxygenation: Pre-oxygenation with 100% oxygen Induction Type: IV induction Ventilation: Mask ventilation without difficulty Laryngoscope Size: Mac and 4 Grade View: Grade I Tube type: Oral Number of attempts: 1 Airway Equipment and Method: Stylet Placement Confirmation: ETT inserted through vocal cords under direct vision, positive ETCO2 and breath sounds checked- equal and bilateral Secured at: 23 cm Tube secured with: Tape Dental Injury: Teeth and Oropharynx as per pre-operative assessment

## 2023-03-29 NOTE — Transfer of Care (Signed)
Immediate Anesthesia Transfer of Care Note  Patient: Wesley [name]  Procedure(s) Performed: NEPHROLITHOTOMY PERCUTANEOUS (Right: Back) HOLMIUM LASER APPLICATION (Right: Flank)  Patient Location: PACU  Anesthesia Type:General  Level of Consciousness: awake and alert   Airway & Oxygen Therapy: Patient Spontanous Breathing and Patient connected to nasal cannula oxygen  Post-op Assessment: Report given to RN and Post -op Vital signs reviewed and stable  Post vital signs: Reviewed and stable  Last Vitals:  Vitals Value Taken Time  BP 162/78 03/29/23 1823  Temp    Pulse 81 03/29/23 1822  Resp 13 03/29/23 1825  SpO2 100 % 03/29/23 1822  Vitals shown include unvalidated device data.  Last Pain:  Vitals:   03/29/23 1311  TempSrc: Oral  PainSc:          Complications: No notable events documented.

## 2023-03-29 NOTE — H&P (Signed)
Wesley [name] is an 72 y.o. male.    Chief Complaint: Pre-OP RIGHT Percutaneous Nephrostolithotomy  HPI:   1 - Moderate Risk Prostate Cancer - s/p brachytherapy / external beam radiation 2010 for Grade 2 disease. 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 Bilateral Malignatn Hydro as well making stage 3. No adenopathy or chest lesions. Denied neoadjuvant chemo be med-on (Dr. Arbutus Ped 02/2023). Felt non-ideal cancdiate for additioan radiation as anticiparted and thiat couls worsen hydro (Dr. Kathrynn Running 02/2023).   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 CT 2024 wtirh bilateral hydro to bladder tumor. Lt JJ stent placed, Rt neph tube placed.   4 - Urolithiasis - RLP 8mm and 4mm stones on CT 2024. H/o ureteroscopy years ago.   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 has h/o pelvic radiation.   PMH sig for lap hiatal hernia repair, crani for meningioma (no deficits). 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 Moved to [place] to be close to wife's family. His HIs wife Wesley [name] is involved. PCP is Wesley [name] in [place].   Today " Wesley [name] " is seen to proceed with RIGHT percutaneous nephrostolithotomy with goal of stone free before cystecotmy / urinary diversion. No interval fevers. Most recent UCX enterococcus sens amp, cipro, gent, pcn. Has been on keflex pre-op to reduce colonization.   Past Medical History:  Diagnosis Date   Arthritis    Atypical chest pain    cardiologist-- dr Leeann Must;  chronic occasional,  pressure / discomfort, LOV 07/14/21 (as of 06/08/22)   Bladder calculus    BPH with obstruction/lower urinary tract symptoms    Follows with Dr. Kasandra Knudsen, urologist.   Complex partial seizure [facility])    followed by neurologist--- dr d. 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 Daphane Shepherd at [facility] neurology, LOV w/ FNP on 03/21/22 in Epic ( as of 06/08/22)   Generalized abdominal pain    w/  bloating due to UC  take doxepin   GERD (gastroesophageal reflux disease)    H/O urinary retention    History of bladder stone    History of hiatal hernia    History of kidney stones    History of prostate cancer 2010   urologist--- dr pace;   dx 2010,  Gleason 4+3, PSA 5.2;   03-04-2009  s/p radiactive prostate seed implants and external beam radiation   History of repair of hiatal hernia    02-11-2017  s/p lap paraesophageal hernia repair w/ nissen fundoplication   History of skin cancer    Hx of resection of meningioma 02/11/2009   benign right temperal lobe meningioma s/p resection  (residual hearing loss)   (neurosurgeon-- dr Tyrone Sage)   Hypertension    followed by cardiologist and pcp   Insomnia    Migraine with aura and without status migrainosus, not intractable    neurologist-- dr Marlena Clipper   Prostate cancer [facility])    Simple renal cyst    bilateral ;   followed by urologist   Ulcerative colitis (HCC)    followed by dr v. Rhetta Mura (GI);  dx 2010;  current treatment stelara q8wks, LOV 06/07/22 (as of 06/08/22)   Wears glasses     Past Surgical History:  Procedure Laterality Date   BOTOX INJECTION N/A 01/29/2023   Procedure: BOTOX INJECTION 100 UNITS;  Surgeon: Noel Christmas, MD;  Location: [facility];  Service: Urology;  Laterality: N/A;   BRAIN MENINGIOMA EXCISION  02/11/2009   @[facility] ;  craniotomy , right temporal medial sphonoid wing resection   CARPAL TUNNEL RELEASE Bilateral    right 09-05-2016  @[facility] ;   left 2013   COLONOSCOPY  12/2019   COLONOSCOPY  01/2022   CYSTOSCOPY  03/05/2012   Procedure: CYSTOSCOPY;  Surgeon: Antony Haste, MD;  Location: [facility];  Service: Urology;   Laterality: N/A;  Cysto Clot Evacuation and Fulgeration Of [place] ([place])   CYSTOSCOPY WITH LITHOLAPAXY N/A 03/13/2019   Procedure: CYSTOSCOPY WITH lASER LITHOLAPAXY, DILATION OF STRICTURE;  Surgeon: Ihor Gully, MD;  Location: [facility];  Service: Urology;  Laterality: N/A;   CYSTOSCOPY WITH LITHOLAPAXY N/A 01/02/2022   Procedure: CYSTOSCOPY WITH LITHOTRIPSY;  Surgeon: Noel Christmas, MD;  Location: [facility];  Service: Urology;  Laterality: N/A;   CYSTOSCOPY WITH RETROGRADE PYELOGRAM, URETEROSCOPY AND STENT PLACEMENT Right 03/13/2022   Procedure: CYSTOSCOPY WITH RETROGRADE PYELOGRAM, URETEROSCOPY AND STENT PLACEMENT;  Surgeon: Noel Christmas, MD;  Location: [facility];  Service: Urology;  Laterality: Right;  75 MINS   CYSTOSCOPY WITH RETROGRADE PYELOGRAM, URETEROSCOPY AND STENT PLACEMENT Right 08/07/2022   Procedure: CYSTOSCOPY WITH RETROGRADE PYELOGRAM, URETEROSCOPY AND STENT PLACEMENT, REMOVAL OF NEPHROURETERAL STENT;  Surgeon: Noel Christmas, MD;  Location: [facility];  Service: Urology;  Laterality: Right;  75 MINS   CYSTOSCOPY WITH URETHRAL DILATATION N/A 08/23/2020   Procedure: CYSTOSCOPY WITH URETHRAL DILATATION, BLADDER BIOPSY, FULGERATION;  Surgeon: Noel Christmas, MD;  Location: [facility];  Service: Urology;  Laterality: N/A;  1 HR   CYSTOSCOPY WITH URETHRAL DILATATION N/A 03/13/2021   Procedure: CYSTOSCOPY WITH URETHRAL BALLOON DILATATION/ FOLEY CATHETER PLACEMENT;  Surgeon: Noel Christmas, MD;  Location[facility];  Service: Urology;  Laterality: N/A;   CYSTOSCOPY WITH URETHRAL DILATATION Left 07/08/2009   @[facility] :   with left  RTG   CYSTOSCOPY WITH URETHRAL DILATATION N/A 06/19/2022   Procedure: CYSTOSCOPY WITH OPTILUME URETHRAL DILATATION;  Surgeon: Noel Christmas, MD;  Location: [facility];  Service: Urology;  Laterality: N/A;   CYSTOSCOPY/URETEROSCOPY/HOLMIUM LASER/STENT PLACEMENT  Bilateral 01/29/2023   Procedure: DIAGNOSTIC CYSTOSCOPY , LEFT RETROGRADE PYELOGRAM, LEFT URETERAL STENT PLACEMENT, AND STONE BASKETING;  Surgeon: Noel Christmas, MD;  Location: [facility];  Service: Urology;  Laterality: Bilateral;   HOLMIUM LASER APPLICATION N/A 08/23/2020   Procedure: HOLMIUM LASER APPLICATION OF URETHRAL STONE;  Surgeon: Noel Christmas, MD;  Location: [facility];  Service: Urology;  Laterality: N/A;   HOLMIUM LASER APPLICATION Right 03/13/2022   Procedure: HOLMIUM LASER APPLICATION;  Surgeon: Noel Christmas, MD;  Location: [facility];  Service: Urology;  Laterality: Right;   HOLMIUM LASER APPLICATION Right 08/07/2022   Procedure: HOLMIUM LASER APPLICATION;  Surgeon: Noel Christmas, MD;  Location[facility];  Service: Urology;  Laterality: Right;   INGUINAL HERNIA REPAIR Right 05/03/2015   @[facility]    INSERTION PROSTATE RADIATION SEED  03/04/2009   @[facility]    IR NEPHROSTOMY PLACEMENT RIGHT  07/18/2022   IR URETERAL STENT RIGHT NEW ACCESS W/O SEP NEPHROSTOMY CATH  02/04/2023   KNEE CARTILAGE SURGERY  Left    1980S   LAPAROSCOPIC PARAESOPHAGEAL HERNIA REPAIR  02/11/2017   @[facility] ;  W/  NISSEN FUNDOPLICATION (MESH)   PROSTATE BIOPSY     TOE SURGERY     TRANSURETHRAL RESECTION OF BLADDER TUMOR N/A 01/02/2022   Procedure: TRANSURETHRAL RESECTION OF BLADDER TUMOR (TURBT);  Surgeon: Noel Christmas, MD;  Location: [facility];  Service: Urology;  Laterality: N/A;   TRANSURETHRAL RESECTION OF BLADDER TUMOR N/A 01/29/2023   Procedure: TRANSURETHRAL RESECTION OF BLADDER TUMOR (TURBT);  Surgeon: Noel Christmas, MD;  Location: [facility];  Service: Urology;  Laterality: N/A;    No family history on file. Social History:  reports that he quit smoking about 36 years ago. His smoking use included cigarettes. He has a 10.00 pack-year smoking history. He has never used smokeless tobacco. He reports current  alcohol use. He reports that he does not use drugs.  Allergies:  Allergies  Allergen Reactions   Sulfa Antibiotics Rash    Childhood    No medications prior to admission.    No results found for this or any previous visit (from the past 48 hour(s)). No results found.  Review of Systems  Constitutional:  Negative for chills and fever.  Genitourinary:  Positive for hematuria.  All other systems reviewed and are negative.   There were no vitals taken for this visit. Physical Exam Vitals reviewed.  Constitutional:      Comments: Extremly pleasant, at baseline.   HENT:     Head: Normocephalic.  Eyes:     Pupils: Pupils are equal, round, and reactive to light.  Cardiovascular:     Rate and Rhythm: Normal rate.  Pulmonary:     Effort: Pulmonary effort is normal.  Abdominal:     General: Abdomen is flat.  Musculoskeletal:        General: Normal range of motion.  Skin:    General: Skin is warm.  Neurological:     General: No focal deficit present.     Mental Status: He is alert.  Psychiatric:        Mood and Affect: Mood normal.      Assessment/Plan  Proceed as planned with RIGHT PCNL for goal of stone free prior to urinary diversion. Risks, benefits, alternatives, expecrted peri-op course discussed previously and reiteratred today. Will certainly either exchange neph tube or leave nephro-ureteral stent in place after given likelyk onging malignant obstruction.   Loletta Parish., MD 03/29/2023, 6:54 AM

## 2023-03-29 NOTE — Brief Op Note (Signed)
03/29/2023  6:02 PM  PATIENT:  Rolanda Jay  72 y.o. male  PRE-OPERATIVE DIAGNOSIS:  RIGHT RENAL STONES  POST-OPERATIVE DIAGNOSIS:  RIGHT RENAL + URETERAL STONES  PROCEDURE:  Procedure(s) with comments: NEPHROLITHOTOMY PERCUTANEOUS (Right) - 2 HRS HOLMIUM LASER APPLICATION (Right)  SURGEON:  Surgeon(s) and Role:    * Navraj Dreibelbis, Delbert Phenix., MD - Primary  PHYSICIAN ASSISTANT:   ASSISTANTS: none   ANESTHESIA:   general  EBL:  20mL   BLOOD ADMINISTERED:none  DRAINS:  right nephrostomy to gravity    LOCAL MEDICATIONS USED:  NONE  SPECIMEN:  Source of Specimen:  right renal / ureteral stone fragments  DISPOSITION OF SPECIMEN:   [facility] for compositional analysis  COUNTS:  YES  TOURNIQUET:  * No tourniquets in log *  DICTATION: .Other Dictation: Dictation Number [id number]  PLAN OF CARE: Admit for overnight observation  PATIENT DISPOSITION:  PACU - hemodynamically stable.   Delay start of Pharmacological VTE agent (>24hrs) due to surgical blood loss or risk of bleeding: yes

## 2023-03-29 NOTE — Anesthesia Preprocedure Evaluation (Addendum)
Anesthesia Evaluation  Patient identified by MRN, date of birth, ID band Patient awake    Reviewed: Allergy & Precautions, H&P , NPO status , Patient's Chart, lab work & pertinent test results  Airway Mallampati: II  TM Distance: >3 FB Neck ROM: Full    Dental no notable dental hx.    Pulmonary former smoker   Pulmonary exam normal breath sounds clear to auscultation       Cardiovascular hypertension, Pt. on medications Normal cardiovascular exam Rhythm:Regular Rate:Normal     Neuro/Psych Seizures -, Well Controlled,   negative psych ROS   GI/Hepatic Neg liver ROS,GERD  Medicated,,  Endo/Other  negative endocrine ROS    Renal/GU Renal InsufficiencyRenal disease  negative genitourinary   Musculoskeletal negative musculoskeletal ROS (+)    Abdominal   Peds negative pediatric ROS (+)  Hematology negative hematology ROS (+)   Anesthesia Other Findings   Reproductive/Obstetrics negative OB ROS                             Anesthesia Physical Anesthesia Plan  ASA: 3  Anesthesia Plan: General   Post-op Pain Management: Minimal or no pain anticipated   Induction: Intravenous  PONV Risk Score and Plan: 2 and Ondansetron, Dexamethasone and Treatment may vary due to age or medical condition  Airway Management Planned: Oral ETT  Additional Equipment:   Intra-op Plan:   Post-operative Plan: Extubation in OR  Informed Consent: I have reviewed the patients History and Physical, chart, labs and discussed the procedure including the risks, benefits and alternatives for the proposed anesthesia with the patient or authorized representative who has indicated his/her understanding and acceptance.     Dental advisory given  Plan Discussed with: CRNA and Surgeon  Anesthesia Plan Comments:        Anesthesia Quick Evaluation

## 2023-03-29 NOTE — Discharge Instructions (Addendum)
1 - You may have urinary urgency (bladder spasms) and bloody urine on / off. This is normal.  2 - Call MD or go to ER for fever >102, severe pain / nausea / vomiting not relieved by medications, or acute change in medical status  

## 2023-03-29 NOTE — Op Note (Signed)
NAME: Wesley, [name] MEDICAL RECORD NO: [id number] ACCOUNT NO: [id number] DATE OF BIRTH: 1951/01/06 FACILITY: [facility] LOCATION: [facility] PHYSICIAN: Sebastian Ache, MD  Operative Report   DATE OF PROCEDURE: 03/29/2023  PREOPERATIVE DIAGNOSIS:  Right renal stones, locally advanced bladder cancer.  PROCEDURE: 1.  Right percutaneous nephrostolithotomy stone less than 2 cm. 2.  Right antegrade nephrostogram interpretation. 3.  Right diagnostic ureteroscopy. 4.  Exchange of right nephrostomy tube. 5.  Dilation of percutaneous tract.  ESTIMATED BLOOD LOSS:  20 mL  COMPLICATIONS:  None.  SPECIMEN:  Right renal and ureteral stone fragment for composition analysis.  FINDINGS: 1.  Right lower pole parenchymal stone not accessible to intraluminal technique. 2.  Right mid ureteral stone amenable to simple basketing. 3.  Complete occlusion of right distal ureter at the UVJ malignant obstruction. 4.  Successful replacement of right nephrostomy tube, 10-French type to gravity drainage.  INDICATIONS:  The patient is an incredibly pleasant 72 year old man with a remote history of prostate cancer, status post radiation approximately 15 years ago.  He has been disease free from his prostate cancer since then.  He unfortunately has developed  locally advanced bladder cancer, clinically stage III with malignant hydronephrosis and was referred for consideration of definitive management with possible cystectomy.  He was evaluated and found to be a fair candidate as he has minimal other options  as his overall renal function does not tolerate chemotherapy and has already had pelvic radiation.  I discussed a path towards cystectomy and given his intraluminal stones on the right I counseled him towards procedure for stone removal prior to  cystectomy and urinary diversion as stones after urinary diversion can be quite complicated and dangerous to treat.  He already has a right nephrostomy tube in place.  We  agreed upon right percutaneous nephrostolithotomy with goal of stone free.  He  presents for this today.  Informed consent was obtained and placed in medical record.  PROCEDURE IN DETAIL:  The patient being Wesley [name], with procedure right percutaneous nephrostolithotomy was confirmed.  Procedure timeout was performed.  Intravenous antibiotics were administered.  General endotracheal anesthesia introduced.  The  patient was placed into a prone position after Foley catheter was placed, free to straight drain, prone view was employed as were chest rolls, axillary rolls, padding of his knees and ankles.  A percutaneous drape was placed over the in situ, right  nephrostomy tube, which was prepped using chlorhexidine gluconate.  Initial antegrade nephrostogram revealed excellent placement of right nephrostomy tube into a upper mid calix.  This was an intracostal tube and calcifications in the lower pole  consistent with known stone and free flow of contrast down to the distal ureter; however, no flow to the bladder and ZIPwire was advanced to the level of the kidney.  Nephrostomy tube was removed.  The ZIPwire was navigated down the ureter via a KMP  catheter, multiple cannulation attempts were made to cannulate the bladder; however, this was unsuccessful, likely consistent with known malignant obstruction.  The ZIPwire was exchanged for a Super Stiff wire via the KMP catheter and a second Super  Stiff wire was advanced after cannulated with a dual lumen introducer, having obtained Super Stiff wire access one was set aside as a safety wire.  Incision was made proximal 1.5 cm level of the skin and fascia.  The fascia was dilated with hemostat  under fluoroscopic vision and the 30-French NephroMax balloon dilation apparatus was carefully advanced across this calix, inflated to a pressure  of 30 atmospheres, held for 90 seconds and the sheath was carefully advanced under fluoroscopic vision to  the level of  the calix.  Rigid nephroscopy was then performed, which revealed excellent placement of the sheath in direct apposition of the calix.  This infundibulum was somewhat narrow and would not allow visualization via the rigid scope as such, a  flexible nephroscopy was performed using a 16-French flexible cystoscope, which did allow visualization of the renal pelvis and proximal ureter via this approach, however, not of the lower pole calices, which were somewhat acutely angled.  Therefore, a  single channel ureteroscope was used and panendoscopic examination was performed of all intraluminal calices, the lower pole calcifications in question were not intraluminal and found to be parenchymal and this was verified with very careful  nephrostogram simultaneous with direct nephroscopy. As the goal today was to verify stone free flexible ureteroscope was advanced down to the level of the ureter.  This was performed *** ureteroscopy at the level of mid ureter, there was actually a  significant calcification approximately 6-7 mm.  This was amenable to basketing with Escape basket and was removed, set aside for composition analysis and antegrade ureteroscopy was performed all the way to the level of the ureterovesical junction where  nodular tissue was encountered.  Notably, this was nonpapillary and this was an impassable consistent with malignant obstruction.  We achieved the goals of rendering the right side stone free from all accessible fragments and a new 10-French nephrostomy  tube was carefully placed over the working wire and deployed under fluoroscopic vision.  Safety wire was then removed.  The nephrostomy tube was anchored in place via its own anterior-locking mechanism plus interrupted silk to the level of skin and the  percutaneous tract was reapproximated using interrupted Vicryl resulted in excellent hemostasis of the percutaneous site.  Drain was placed to straight drain.  Procedure was terminated.  The  patient tolerated procedure well, no immediate perioperative  complications.  The patient taken to postanesthesia care in stable condition.  Plan for observation admission. Pending his recovery we will likely proceed with booking his cystectomy, long *** goals of care to remain aggressive.   SHY D: 03/29/2023 6:10:59 pm T: 03/29/2023 9:31:00 pm  JOB: [id number]/ [id number]

## 2023-03-30 ENCOUNTER — Other Ambulatory Visit: Payer: Self-pay

## 2023-03-30 DIAGNOSIS — N202 Calculus of kidney with calculus of ureter: Secondary | ICD-10-CM | POA: Diagnosis not present

## 2023-03-30 DIAGNOSIS — I10 Essential (primary) hypertension: Secondary | ICD-10-CM | POA: Diagnosis not present

## 2023-03-30 DIAGNOSIS — C61 Malignant neoplasm of prostate: Secondary | ICD-10-CM | POA: Diagnosis not present

## 2023-03-30 DIAGNOSIS — Z87891 Personal history of nicotine dependence: Secondary | ICD-10-CM | POA: Diagnosis not present

## 2023-03-30 DIAGNOSIS — Z8546 Personal history of malignant neoplasm of prostate: Secondary | ICD-10-CM | POA: Diagnosis not present

## 2023-03-30 DIAGNOSIS — Z85828 Personal history of other malignant neoplasm of skin: Secondary | ICD-10-CM | POA: Diagnosis not present

## 2023-03-30 DIAGNOSIS — N35919 Unspecified urethral stricture, male, unspecified site: Secondary | ICD-10-CM | POA: Diagnosis not present

## 2023-03-30 DIAGNOSIS — C679 Malignant neoplasm of bladder, unspecified: Secondary | ICD-10-CM | POA: Diagnosis not present

## 2023-03-30 LAB — BASIC METABOLIC PANEL
Anion gap: 8 (ref 5–15)
Anion gap: 7 (ref 5–15)
BUN: 36 mg/dL — ABNORMAL HIGH (ref 8–23)
BUN: 41 mg/dL — ABNORMAL HIGH (ref 8–23)
CO2: 18 mmol/L — ABNORMAL LOW (ref 22–32)
CO2: 25 mmol/L (ref 22–32)
Calcium: 8.5 mg/dL — ABNORMAL LOW (ref 8.9–10.3)
Calcium: 8.1 mg/dL — ABNORMAL LOW (ref 8.9–10.3)
Chloride: 106 mmol/L (ref 98–111)
Chloride: 101 mmol/L (ref 98–111)
Creatinine, Ser: 2.33 mg/dL — ABNORMAL HIGH (ref 0.61–1.24)
Creatinine, Ser: 2.05 mg/dL — ABNORMAL HIGH (ref 0.61–1.24)
GFR, Estimated: 29 mL/min — ABNORMAL LOW (ref >60)
GFR, Estimated: 34 mL/min — ABNORMAL LOW (ref >60)
Glucose, Bld: 90 mg/dL (ref 70–99)
Glucose, Bld: 121 mg/dL — ABNORMAL HIGH (ref 70–99)
Potassium: 5.7 mmol/L — ABNORMAL HIGH (ref 3.5–5.1)
Potassium: 4.4 mmol/L (ref 3.5–5.1)
Sodium: 132 mmol/L — ABNORMAL LOW (ref 135–145)
Sodium: 133 mmol/L — ABNORMAL LOW (ref 135–145)

## 2023-03-30 LAB — HEMOGLOBIN AND HEMATOCRIT, BLOOD
HCT: 36 % — ABNORMAL LOW (ref 39.0–52.0)
Hemoglobin: 11.9 g/dL — ABNORMAL LOW (ref 13.0–17.0)

## 2023-03-30 MED ORDER — ORAL CARE MOUTH RINSE: 15.0000 mL | OROMUCOSAL | Status: DC | PRN

## 2023-03-30 MED ORDER — LEVETIRACETAM 500 MG PO TABS
500.0000 mg | ORAL_TABLET | Freq: Three times a day (TID) | ORAL | Status: DC
  Administered 2023-03-30 – 2023-03-31 (×4): 500 mg via ORAL
  Filled 2023-03-30 (×4): qty 1

## 2023-03-30 MED ORDER — TAMSULOSIN HCL 0.4 MG PO CAPS
0.4000 mg | ORAL_CAPSULE | Freq: Every day | ORAL | Status: DC
  Administered 2023-03-30: 0.4 mg via ORAL
  Filled 2023-03-30: qty 1

## 2023-03-30 MED ORDER — HYDROMORPHONE HCL 1 MG/ML IJ SOLN: 0.5000 mg | INTRAMUSCULAR | Status: DC | PRN

## 2023-03-30 MED ORDER — MIRABEGRON ER 25 MG PO TB24
50.0000 mg | ORAL_TABLET | Freq: Every evening | ORAL | Status: DC
  Administered 2023-03-30: 50 mg via ORAL
  Filled 2023-03-30: qty 2

## 2023-03-30 MED ORDER — FINASTERIDE 5 MG PO TABS
5.0000 mg | ORAL_TABLET | Freq: Every day | ORAL | Status: DC
  Administered 2023-03-30 – 2023-03-31 (×2): 5 mg via ORAL
  Filled 2023-03-30 (×2): qty 1

## 2023-03-30 MED ORDER — CHLORHEXIDINE GLUCONATE CLOTH 2 % EX PADS
6.0000 | MEDICATED_PAD | Freq: Every day | CUTANEOUS | Status: DC
  Administered 2023-03-30: 6 via TOPICAL

## 2023-03-30 NOTE — Progress Notes (Signed)
1 Day Post-Op Subjective: Patient feeling well this morning status post right PCNL.  Potassium level is 5.7 this morning.  Patient has not eaten regular food yet.  Objective: Vital signs in last 24 hours: Temp:  [97.6 F (36.4 C)-98.9 F (37.2 C)] 98.4 F (36.9 C) (06/22 0529) Pulse Rate:  [65-88] 65 (06/22 0529) Resp:  [10-18] 17 (06/22 0529) BP: (119-177)/(67-110) 149/76 (06/22 0529) SpO2:  [93 %-100 %] 100 % (06/22 0529) Weight:  [59.4 kg-60.8 kg] 60.8 kg (06/21 2021)  Intake/Output from previous day: 06/21 0701 - 06/22 0700 In: 410.8 [P.O.:60; I.V.:250.8; IV Piggyback:100] Out: 590 [Urine:590] Intake/Output this shift: No intake/output data recorded.  Physical Exam:  General: Alert and oriented  Lab Results: Recent Labs    03/29/23 1933 03/30/23 0435  HGB 12.2* 11.9*  HCT 37.4* 36.0*   BMET Recent Labs    03/30/23 0435  NA 132*  K 5.7*  CL 106  CO2 18*  GLUCOSE 90  BUN 36*  CREATININE 2.33*  CALCIUM 8.5*     Studies/Results: DG C-Arm 1-60 Min-No Report  Result Date: 03/29/2023 Fluoroscopy was utilized by the requesting physician.  No radiographic interpretation.   DG C-Arm 1-60 Min-No Report  Result Date: 03/29/2023 Fluoroscopy was utilized by the requesting physician.  No radiographic interpretation.    Assessment/Plan: 1.  Bladder cancer with obstructive uropathy status post nephrostomy tube and PCNL for stones prior to upcoming cystectomy. 2.  Hyperkalemia Plan/recommendation.  Will advance diet today ambulate continue fluids monitor potassium probable DC home in a.m.    LOS: 0 days   Belva Agee 03/30/2023, 7:25 AM

## 2023-03-30 NOTE — Progress Notes (Signed)
   03/30/23 1501  TOC Brief Assessment  Insurance and Status Reviewed  Patient has primary care physician Yes  Home environment has been reviewed home with spouse  Prior level of function: independent  Prior/Current Home Services No current home services  Social Determinants of Health Reivew SDOH reviewed no interventions necessary  Readmission risk has been reviewed Yes  Transition of care needs no transition of care needs at this time

## 2023-03-31 DIAGNOSIS — I10 Essential (primary) hypertension: Secondary | ICD-10-CM | POA: Diagnosis not present

## 2023-03-31 DIAGNOSIS — C61 Malignant neoplasm of prostate: Secondary | ICD-10-CM | POA: Diagnosis not present

## 2023-03-31 DIAGNOSIS — N35919 Unspecified urethral stricture, male, unspecified site: Secondary | ICD-10-CM | POA: Diagnosis not present

## 2023-03-31 DIAGNOSIS — N202 Calculus of kidney with calculus of ureter: Secondary | ICD-10-CM | POA: Diagnosis not present

## 2023-03-31 DIAGNOSIS — Z87891 Personal history of nicotine dependence: Secondary | ICD-10-CM | POA: Diagnosis not present

## 2023-03-31 DIAGNOSIS — C679 Malignant neoplasm of bladder, unspecified: Secondary | ICD-10-CM | POA: Diagnosis not present

## 2023-03-31 DIAGNOSIS — Z8546 Personal history of malignant neoplasm of prostate: Secondary | ICD-10-CM | POA: Diagnosis not present

## 2023-03-31 DIAGNOSIS — Z85828 Personal history of other malignant neoplasm of skin: Secondary | ICD-10-CM | POA: Diagnosis not present

## 2023-03-31 LAB — BASIC METABOLIC PANEL
Anion gap: 7 (ref 5–15)
BUN: 38 mg/dL — ABNORMAL HIGH (ref 8–23)
CO2: 25 mmol/L (ref 22–32)
Calcium: 8.3 mg/dL — ABNORMAL LOW (ref 8.9–10.3)
Chloride: 103 mmol/L (ref 98–111)
Creatinine, Ser: 2.12 mg/dL — ABNORMAL HIGH (ref 0.61–1.24)
GFR, Estimated: 32 mL/min — ABNORMAL LOW (ref >60)
Glucose, Bld: 107 mg/dL — ABNORMAL HIGH (ref 70–99)
Potassium: 4.9 mmol/L (ref 3.5–5.1)
Sodium: 135 mmol/L (ref 135–145)

## 2023-03-31 NOTE — Plan of Care (Signed)

## 2023-03-31 NOTE — Discharge Summary (Signed)
Date of admission: 03/29/2023  Date of discharge: 03/31/2023  Admission diagnosis: 1.  Right renal calculi 2.  Bladder cancer   Discharge diagnosis: Same  Secondary diagnoses:   History and Physical: For full details, please see admission history and physical. Briefly, Wesley [name] is a 72 y.o. year old patient with extensive bladder cancer with obstruction of right ureter.  This has been managed with nephrostomy tube.  Has renal calculi.  Presents this time undergo PCNL for removal of the calculi prior to definitive cystectomy for his bladder cancer with urinary diversion.Marland Kitchen   Hospital Course: Patient was admitted on 03/29/2023 after undergoing right-sided percutaneous nephrolithotomy.  For details procedure please see the typed operative note.  Patient postoperative course was unremarkable.  He did have elevated potassium on the first postoperative morning which was treated conservatively with hydration subsequently normalized.  He was kept an extra day because of this.  Second postoperative day felt ready for discharge home his potassium had normalized he was eating normal diet and ambulating quite well.  He will be discharged home on routine preoperative medications.  He has scheduled follow-up with Dr. Berneice Heinrich in the office in 2 to 3 weeks.  He has our telephone number and knows to call should problems arise relative to his management in the interim.  Laboratory values:  Recent Labs    03/29/23 1933 03/30/23 0435  HGB 12.2* 11.9*  HCT 37.4* 36.0*   Recent Labs    03/30/23 2113 03/31/23 0334  CREATININE 2.05* 2.12*    Disposition: Home  Discharge instruction: The patient was instructed to be ambulatory but told to refrain from heavy lifting, strenuous activity, or driving.   Discharge medications:  Allergies as of 03/31/2023       Reactions   Sulfa Antibiotics Rash   Childhood        Medication List     STOP taking these medications    celecoxib 100 MG  capsule Commonly known as: CELEBREX   cephALEXin 500 MG capsule Commonly known as: KEFLEX   losartan 50 MG tablet Commonly known as: COZAAR   Testosterone 20.25 MG/ACT (1.62%) Gel       TAKE these medications    amLODipine 5 MG tablet Commonly known as: NORVASC Take 5 mg by mouth at bedtime.   Biotin 5000 5 MG Caps Generic drug: Biotin Take 5 mg by mouth daily.   Breo Ellipta 100-25 MCG/ACT Aepb Generic drug: fluticasone furoate-vilanterol TAKE 1 PUFF BY MOUTH EVERY DAY   ciclopirox 0.77 % cream Commonly known as: LOPROX Apply 1 application topically 2 (two) times daily as needed (dry/flaky skin).   Ciclopirox 1 % shampoo Apply 1 application topically daily as needed (dry/flaky skin).   diphenhydrAMINE 25 MG tablet Commonly known as: BENADRYL Take 25 mg by mouth at bedtime.   doxepin 25 MG capsule Commonly known as: SINEQUAN Take 25 mg by mouth at bedtime.   ferrous sulfate 325 (65 FE) MG tablet Take 325 mg by mouth daily.   finasteride 5 MG tablet Commonly known as: PROSCAR Take 5 mg by mouth daily.   levETIRAcetam 500 MG tablet Commonly known as: KEPPRA Take 500 mg by mouth 3 (three) times daily.   Myrbetriq 50 MG Tb24 tablet Generic drug: mirabegron ER Take 50 mg by mouth every evening.   neomycin-polymyxin b-dexamethasone 3.5-10000-0.1 Oint Commonly known as: MAXITROL Place 1 Application into the left eye 2 (two) times daily.   Oxtellar XR 600 MG Tb24 Generic drug: OXcarbazepine ER Take 600  mg by mouth daily in the afternoon.   oxyCODONE-acetaminophen 5-325 MG tablet Commonly known as: Percocet Take 1 tablet by mouth every 6 (six) hours as needed for severe pain or moderate pain (post-operatively).   primidone 50 MG tablet Commonly known as: MYSOLINE Take 100 mg by mouth at bedtime.   senna-docusate 8.6-50 MG tablet Commonly known as: Senokot-S Take 1 tablet by mouth 2 (two) times daily. While taking strong pain meds to prevent  constipation.   tamsulosin 0.4 MG Caps capsule Commonly known as: FLOMAX Take 1 capsule (0.4 mg total) by mouth daily. What changed: when to take this   Trospium Chloride 60 MG Cp24 Take 1 capsule by mouth daily.   ustekinumab 90 MG/ML Sosy injection Commonly known as: STELARA Inject 90 mg into the skin every 8 (eight) weeks.        Followup:   Follow-up Information     Berneice Heinrich Delbert Phenix., MD Follow up on 04/16/2023.   Specialty: Urology Why: at 41 AM for MD visit. Contact information: [address] [place] [address] [phone]

## 2023-03-31 NOTE — Anesthesia Postprocedure Evaluation (Signed)
Anesthesia Post Note  Patient: Wesley [name]  Procedure(s) Performed: NEPHROLITHOTOMY PERCUTANEOUS (Right: Back)     Patient location during evaluation: PACU Anesthesia Type: General Level of consciousness: awake and alert Pain management: pain level controlled Vital Signs Assessment: post-procedure vital signs reviewed and stable Respiratory status: spontaneous breathing, nonlabored ventilation, respiratory function stable and patient connected to nasal cannula oxygen Cardiovascular status: blood pressure returned to baseline and stable Postop Assessment: no apparent nausea or vomiting Anesthetic complications: no  No notable events documented.  Last Vitals:  Vitals:   03/31/23 0556 03/31/23 0837  BP: (!) 158/84   Pulse: 68   Resp: 20   Temp: 36.7 C   SpO2: 96% 94%    Last Pain:  Vitals:   03/31/23 0753  TempSrc:   PainSc: 0-No pain                 Dontrez Pettis S

## 2023-03-31 NOTE — Progress Notes (Signed)
2 Days Post-Op Subjective: Patient feeling well status post PCNL.  Potassium has normalized.  Wants to go home.  Objective: Vital signs in last 24 hours: Temp:  [97.9 F (36.6 C)-98.4 F (36.9 C)] 98.1 F (36.7 C) (06/23 0556) Pulse Rate:  [65-75] 68 (06/23 0556) Resp:  [16-20] 20 (06/23 0556) BP: (144-167)/(73-88) 158/84 (06/23 0556) SpO2:  [94 %-100 %] 96 % (06/23 0556)  Intake/Output from previous day: 06/22 0701 - 06/23 0700 In: 2180 [P.O.:1030; I.V.:1150] Out: 2950 [Urine:2950] Intake/Output this shift: No intake/output data recorded.  Physical Exam:  General: Alert and oriented  Lab Results: Recent Labs    03/29/23 1933 03/30/23 0435  HGB 12.2* 11.9*  HCT 37.4* 36.0*   BMET Recent Labs    03/30/23 2113 03/31/23 0334  NA 133* 135  K 4.4 4.9  CL 101 103  CO2 25 25  GLUCOSE 121* 107*  BUN 41* 38*  CREATININE 2.05* 2.12*  CALCIUM 8.1* 8.3*     Studies/Results: DG C-Arm 1-60 Min-No Report  Result Date: 03/29/2023 Fluoroscopy was utilized by the requesting physician.  No radiographic interpretation.   DG C-Arm 1-60 Min-No Report  Result Date: 03/29/2023 Fluoroscopy was utilized by the requesting physician.  No radiographic interpretation.    Assessment/Plan: 1.  Status post PCNL, doing well history of extensive bladder cancer which will require upcoming cystectomy.  Plan for discharge home today.  DC Foley prior to discharge.  Has scheduled follow-up with Dr. Berneice Heinrich in a couple of weeks.    LOS: 0 days   Belva Agee 03/31/2023, 7:41 AM

## 2023-04-01 ENCOUNTER — Encounter (HOSPITAL_COMMUNITY): Payer: Self-pay | Admitting: Urology

## 2023-04-16 ENCOUNTER — Other Ambulatory Visit: Payer: Self-pay | Admitting: Urology

## 2023-04-16 DIAGNOSIS — N130 Hydronephrosis with ureteropelvic junction obstruction: Secondary | ICD-10-CM | POA: Diagnosis not present

## 2023-04-16 DIAGNOSIS — C61 Malignant neoplasm of prostate: Secondary | ICD-10-CM | POA: Diagnosis not present

## 2023-04-16 DIAGNOSIS — N200 Calculus of kidney: Secondary | ICD-10-CM | POA: Diagnosis not present

## 2023-04-16 DIAGNOSIS — C670 Malignant neoplasm of trigone of bladder: Secondary | ICD-10-CM | POA: Diagnosis not present

## 2023-05-20 ENCOUNTER — Ambulatory Visit (HOSPITAL_BASED_OUTPATIENT_CLINIC_OR_DEPARTMENT_OTHER)
Admission: RE | Admit: 2023-05-20 | Discharge: 2023-05-20 | Disposition: A | Discharge status: Home / Self Care | Payer: Medicare Other | Source: Ambulatory Visit

## 2023-05-20 DIAGNOSIS — N132 Hydronephrosis with renal and ureteral calculous obstruction: Secondary | ICD-10-CM | POA: Diagnosis not present

## 2023-05-20 DIAGNOSIS — K567 Ileus, unspecified: Secondary | ICD-10-CM | POA: Diagnosis not present

## 2023-05-20 DIAGNOSIS — Z7951 Long term (current) use of inhaled steroids: Secondary | ICD-10-CM | POA: Diagnosis not present

## 2023-05-20 DIAGNOSIS — G40209 Localization-related (focal) (partial) symptomatic epilepsy and epileptic syndromes with complex partial seizures, not intractable, without status epilepticus: Secondary | ICD-10-CM | POA: Diagnosis not present

## 2023-05-20 DIAGNOSIS — C7982 Secondary malignant neoplasm of genital organs: Secondary | ICD-10-CM | POA: Diagnosis not present

## 2023-05-20 DIAGNOSIS — Y842 Radiological procedure and radiotherapy as the cause of abnormal reaction of the patient, or of later complication, without mention of misadventure at the time of the procedure: Secondary | ICD-10-CM | POA: Diagnosis present

## 2023-05-20 DIAGNOSIS — E860 Dehydration: Secondary | ICD-10-CM | POA: Diagnosis not present

## 2023-05-20 DIAGNOSIS — I700 Atherosclerosis of aorta: Secondary | ICD-10-CM | POA: Diagnosis not present

## 2023-05-20 DIAGNOSIS — C786 Secondary malignant neoplasm of retroperitoneum and peritoneum: Secondary | ICD-10-CM | POA: Diagnosis not present

## 2023-05-20 DIAGNOSIS — C678 Malignant neoplasm of overlapping sites of bladder: Secondary | ICD-10-CM | POA: Diagnosis not present

## 2023-05-20 DIAGNOSIS — I129 Hypertensive chronic kidney disease with stage 1 through stage 4 chronic kidney disease, or unspecified chronic kidney disease: Secondary | ICD-10-CM | POA: Diagnosis not present

## 2023-05-20 DIAGNOSIS — N184 Chronic kidney disease, stage 4 (severe): Secondary | ICD-10-CM | POA: Diagnosis not present

## 2023-05-20 DIAGNOSIS — K219 Gastro-esophageal reflux disease without esophagitis: Secondary | ICD-10-CM | POA: Diagnosis present

## 2023-05-20 DIAGNOSIS — C775 Secondary and unspecified malignant neoplasm of intrapelvic lymph nodes: Secondary | ICD-10-CM | POA: Diagnosis not present

## 2023-05-20 DIAGNOSIS — Z01818 Encounter for other preprocedural examination: Secondary | ICD-10-CM | POA: Insufficient documentation

## 2023-05-20 DIAGNOSIS — Z7189 Other specified counseling: Secondary | ICD-10-CM | POA: Insufficient documentation

## 2023-05-20 DIAGNOSIS — C670 Malignant neoplasm of trigone of bladder: Secondary | ICD-10-CM | POA: Diagnosis not present

## 2023-05-20 DIAGNOSIS — R000 Tachycardia, unspecified: Secondary | ICD-10-CM | POA: Diagnosis present

## 2023-05-20 DIAGNOSIS — I10 Essential (primary) hypertension: Secondary | ICD-10-CM | POA: Diagnosis not present

## 2023-05-20 DIAGNOSIS — N35919 Unspecified urethral stricture, male, unspecified site: Secondary | ICD-10-CM | POA: Diagnosis present

## 2023-05-20 DIAGNOSIS — K5190 Ulcerative colitis, unspecified, without complications: Secondary | ICD-10-CM | POA: Diagnosis not present

## 2023-05-20 DIAGNOSIS — L598 Other specified disorders of the skin and subcutaneous tissue related to radiation: Secondary | ICD-10-CM | POA: Diagnosis not present

## 2023-05-20 DIAGNOSIS — H9191 Unspecified hearing loss, right ear: Secondary | ICD-10-CM | POA: Diagnosis not present

## 2023-05-20 DIAGNOSIS — A419 Sepsis, unspecified organism: Secondary | ICD-10-CM | POA: Diagnosis not present

## 2023-05-20 DIAGNOSIS — Z79899 Other long term (current) drug therapy: Secondary | ICD-10-CM | POA: Diagnosis not present

## 2023-05-20 DIAGNOSIS — E785 Hyperlipidemia, unspecified: Secondary | ICD-10-CM | POA: Diagnosis not present

## 2023-05-20 DIAGNOSIS — Z791 Long term (current) use of non-steroidal anti-inflammatories (NSAID): Secondary | ICD-10-CM | POA: Diagnosis not present

## 2023-05-20 DIAGNOSIS — Z882 Allergy status to sulfonamides status: Secondary | ICD-10-CM | POA: Diagnosis not present

## 2023-05-20 DIAGNOSIS — C679 Malignant neoplasm of bladder, unspecified: Secondary | ICD-10-CM | POA: Insufficient documentation

## 2023-05-20 DIAGNOSIS — N39498 Other specified urinary incontinence: Secondary | ICD-10-CM | POA: Diagnosis present

## 2023-05-20 DIAGNOSIS — Z87891 Personal history of nicotine dependence: Secondary | ICD-10-CM | POA: Diagnosis not present

## 2023-05-20 DIAGNOSIS — G250 Essential tremor: Secondary | ICD-10-CM | POA: Diagnosis not present

## 2023-05-20 DIAGNOSIS — F419 Anxiety disorder, unspecified: Secondary | ICD-10-CM | POA: Diagnosis not present

## 2023-05-20 NOTE — Progress Notes (Signed)
[facility]   Reason for visit:  Presurgical marking and education for ileal conduit.  Dr Berneice Heinrich   DOS 05/21/2023 HPI:  Bladder cancer, to undergo ileal conduit Past Medical History:  Diagnosis Date   Arthritis    Atypical chest pain    cardiologist-- dr Leeann Must;  chronic occasional,  pressure / discomfort, LOV 07/14/21 (as of 06/08/22)   Bladder calculus    BPH with obstruction/lower urinary tract symptoms    Follows with Dr. Kasandra Knudsen, urologist.   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 Daphane Shepherd at [facility] neurology, LOV w/ FNP on 03/21/22 in Epic ( as of 06/08/22)   Generalized abdominal pain    w/  bloating due to UC  take doxepin   GERD (gastroesophageal reflux disease)    H/O urinary retention    History of bladder stone    History of hiatal hernia    History of kidney stones    History of prostate cancer 2010   urologist--- dr pace;   dx 2010,  Gleason 4+3, PSA 5.2;   03-04-2009  s/p radiactive prostate seed implants and external beam radiation   History of repair of hiatal hernia    02-11-2017  s/p lap paraesophageal hernia repair w/ nissen fundoplication   History of skin cancer    Hx of resection of meningioma 02/11/2009   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 Rhetta Mura (GI);  dx 2010;  current treatment stelara q8wks, LOV 06/07/22 (as  of 06/08/22)   Wears glasses    No family history on file. Allergies  Allergen Reactions   Sulfa Antibiotics Rash    Childhood   Current Outpatient Medications  Medication Sig Dispense Refill Last Dose   amLODipine (NORVASC) 5 MG tablet Take 5 mg by mouth at bedtime.      Biotin (BIOTIN 5000) 5 MG CAPS Take 5 mg by mouth daily.      celecoxib (CELEBREX) 100 MG capsule Take 100 mg by mouth every evening.      doxepin (SINEQUAN) 25 MG capsule Take 25 mg by mouth at bedtime.      ferrous sulfate 325 (65 FE) MG tablet Take 325 mg by mouth daily.      finasteride (PROSCAR) 5 MG tablet Take 5 mg by mouth daily.      fluticasone furoate-vilanterol (BREO ELLIPTA) 100-25 MCG/ACT AEPB TAKE 1 PUFF BY MOUTH EVERY DAY 60 each 3    ketoconazole (NIZORAL) 2 % cream Apply 1 Application topically daily as needed for irritation.      ketoconazole (NIZORAL) 2 % shampoo Apply 1 Application topically daily as needed (yeast).      levETIRAcetam (KEPPRA) 500 MG tablet Take 500 mg by mouth 3 (three) times daily.      MYRBETRIQ  50 MG TB24 tablet Take 50 mg by mouth every evening.      OXcarbazepine ER (OXTELLAR XR) 600 MG TB24 Take 600 mg by mouth daily in the afternoon.      primidone (MYSOLINE) 50 MG tablet Take 100 mg by mouth at bedtime.      senna-docusate (SENOKOT-S) 8.6-50 MG tablet Take 1 tablet by mouth 2 (two) times daily. While taking strong pain meds to prevent constipation. (Patient taking differently: Take 1 tablet by mouth 2 (two) times daily as needed for mild constipation.) 10 tablet 0    tamsulosin (FLOMAX) 0.4 MG CAPS capsule Take 1 capsule (0.4 mg total) by mouth daily. (Patient taking differently: Take 0.4 mg by mouth at bedtime.) 30 capsule 0    Trospium Chloride 60 MG CP24 Take 1 capsule by mouth daily.      ustekinumab (STELARA) 90 MG/ML SOSY injection Inject 90 mg into the skin every 8 (eight) weeks.      No current facility-administered medications for this encounter.    Facility-Administered Medications Ordered in Other Encounters  Medication Dose Route Frequency Provider Last Rate Last Admin   magnesium citrate solution 1 Bottle  1 Bottle Oral Once Manny, Delbert Phenix., MD       magnesium citrate solution 1 Bottle  1 Bottle Oral Once Berneice Heinrich Delbert Phenix., MD       ROS  Review of Systems  Constitutional:  Positive for fatigue.       Not sleeping well.  Gastrointestinal: Negative.   Genitourinary:  Positive for frequency.       Bladder cancer Bladder calculi  Skin: Negative.   Psychiatric/Behavioral:  The patient is nervous/anxious.        Apprehensive over upcoming surgery but optimistic this will improve his life overall.   All other systems reviewed and are negative.  Vital signs:  BP (!) 180/97 (BP Location: Right Arm)   Temp 99.5 F (37.5 C) (Oral)   Resp 19  Exam:  Physical Exam Vitals (BP rechecked prior to conclusion of visit.  Is now 144/86) reviewed.  Constitutional:      Appearance: Normal appearance.  Abdominal:     Palpations: Abdomen is soft.     Comments: Sagging skin to abdomen.  Recent weight loss 25 pounds.   Skin:    General: Skin is warm and dry.  Neurological:     Mental Status: He is alert and oriented to person, place, and time.  Psychiatric:        Mood and Affect: Mood normal.        Behavior: Behavior normal.      Discussed surgical procedure and stoma creation with patient .  Explained role of the WOC nurse team while inpatient and the outpatient clinic as an available resource once discharged.   Provided the patient with educational booklet and provided samples of pouching options.  Answered patient questions.  Patient has waterfront property and a pool and we discuss water activities at length and continuing this healthy lifestyle with an ostomy.     Examined patient lying, sitting, and standing in order to place the marking in the patient's visual field, away from any creases or abdominal contour issues  and within the rectus muscle.  Patient is not able to see abdomen below umbilicus.     Marked for ileal conduit in the RUQ 5 cm to the right of the umbilicus and  3 cm above the umbilicus.   Patient's abdomen cleansed with CHG wipes  at site markings, allowed to air dry prior to marking.Covered mark with thin film transparent dressing to preserve mark until date of surgery.      Impression/dx  Presurgical education Presurgical ostomy marking Discussion  We discuss life with an ostomy Currently patient is up an average of 14 times night for voiding. We discuss the bedside drainage bag and the possibility of sleeping through the night with the use of that.  He and his wife are pleased that patient can continue to enjoy water activities and an active lifestyle  Plan  Will see back in clinic post operatively.  WOC team will work with patient and wife during the post op hospitalization.     Visit time: 55 minutes.   Maple Hudson FNP-BC

## 2023-05-21 ENCOUNTER — Inpatient Hospital Stay (HOSPITAL_COMMUNITY)
Admission: RE | Admit: 2023-05-21 | Discharge: 2023-05-29 | DRG: 654 | Disposition: A | Discharge status: Home Health | Payer: Medicare Other | Attending: Urology | Admitting: Urology

## 2023-05-21 ENCOUNTER — Other Ambulatory Visit: Payer: Self-pay

## 2023-05-21 ENCOUNTER — Encounter (HOSPITAL_COMMUNITY): Payer: Self-pay | Admitting: Urology

## 2023-05-21 DIAGNOSIS — N39498 Other specified urinary incontinence: Secondary | ICD-10-CM | POA: Diagnosis present

## 2023-05-21 DIAGNOSIS — N184 Chronic kidney disease, stage 4 (severe): Secondary | ICD-10-CM | POA: Diagnosis present

## 2023-05-21 DIAGNOSIS — C678 Malignant neoplasm of overlapping sites of bladder: Principal | ICD-10-CM | POA: Diagnosis present

## 2023-05-21 DIAGNOSIS — E785 Hyperlipidemia, unspecified: Secondary | ICD-10-CM | POA: Diagnosis present

## 2023-05-21 DIAGNOSIS — C786 Secondary malignant neoplasm of retroperitoneum and peritoneum: Secondary | ICD-10-CM | POA: Diagnosis present

## 2023-05-21 DIAGNOSIS — N35919 Unspecified urethral stricture, male, unspecified site: Secondary | ICD-10-CM | POA: Diagnosis present

## 2023-05-21 DIAGNOSIS — Z7951 Long term (current) use of inhaled steroids: Secondary | ICD-10-CM | POA: Diagnosis not present

## 2023-05-21 DIAGNOSIS — H9191 Unspecified hearing loss, right ear: Secondary | ICD-10-CM | POA: Diagnosis present

## 2023-05-21 DIAGNOSIS — G40209 Localization-related (focal) (partial) symptomatic epilepsy and epileptic syndromes with complex partial seizures, not intractable, without status epilepticus: Secondary | ICD-10-CM | POA: Diagnosis present

## 2023-05-21 DIAGNOSIS — Z791 Long term (current) use of non-steroidal anti-inflammatories (NSAID): Secondary | ICD-10-CM | POA: Diagnosis not present

## 2023-05-21 DIAGNOSIS — Z86011 Personal history of benign neoplasm of the brain: Secondary | ICD-10-CM

## 2023-05-21 DIAGNOSIS — E860 Dehydration: Secondary | ICD-10-CM | POA: Diagnosis not present

## 2023-05-21 DIAGNOSIS — K567 Ileus, unspecified: Secondary | ICD-10-CM | POA: Diagnosis not present

## 2023-05-21 DIAGNOSIS — C775 Secondary and unspecified malignant neoplasm of intrapelvic lymph nodes: Secondary | ICD-10-CM | POA: Diagnosis present

## 2023-05-21 DIAGNOSIS — N132 Hydronephrosis with renal and ureteral calculous obstruction: Secondary | ICD-10-CM | POA: Diagnosis present

## 2023-05-21 DIAGNOSIS — I129 Hypertensive chronic kidney disease with stage 1 through stage 4 chronic kidney disease, or unspecified chronic kidney disease: Secondary | ICD-10-CM | POA: Diagnosis present

## 2023-05-21 DIAGNOSIS — Z85828 Personal history of other malignant neoplasm of skin: Secondary | ICD-10-CM

## 2023-05-21 DIAGNOSIS — C7982 Secondary malignant neoplasm of genital organs: Secondary | ICD-10-CM | POA: Diagnosis present

## 2023-05-21 DIAGNOSIS — Z87442 Personal history of urinary calculi: Secondary | ICD-10-CM

## 2023-05-21 DIAGNOSIS — L598 Other specified disorders of the skin and subcutaneous tissue related to radiation: Secondary | ICD-10-CM | POA: Diagnosis present

## 2023-05-21 DIAGNOSIS — G250 Essential tremor: Secondary | ICD-10-CM | POA: Diagnosis present

## 2023-05-21 DIAGNOSIS — R000 Tachycardia, unspecified: Secondary | ICD-10-CM | POA: Diagnosis present

## 2023-05-21 DIAGNOSIS — F419 Anxiety disorder, unspecified: Secondary | ICD-10-CM | POA: Diagnosis present

## 2023-05-21 DIAGNOSIS — I10 Essential (primary) hypertension: Secondary | ICD-10-CM | POA: Diagnosis not present

## 2023-05-21 DIAGNOSIS — C679 Malignant neoplasm of bladder, unspecified: Secondary | ICD-10-CM

## 2023-05-21 DIAGNOSIS — Z960 Presence of urogenital implants: Secondary | ICD-10-CM | POA: Diagnosis present

## 2023-05-21 DIAGNOSIS — Y842 Radiological procedure and radiotherapy as the cause of abnormal reaction of the patient, or of later complication, without mention of misadventure at the time of the procedure: Secondary | ICD-10-CM | POA: Diagnosis present

## 2023-05-21 DIAGNOSIS — Z87891 Personal history of nicotine dependence: Secondary | ICD-10-CM

## 2023-05-21 DIAGNOSIS — Z882 Allergy status to sulfonamides status: Secondary | ICD-10-CM

## 2023-05-21 DIAGNOSIS — K5190 Ulcerative colitis, unspecified, without complications: Secondary | ICD-10-CM | POA: Diagnosis present

## 2023-05-21 DIAGNOSIS — Z7969 Long term (current) use of other immunomodulators and immunosuppressants: Secondary | ICD-10-CM

## 2023-05-21 DIAGNOSIS — K219 Gastro-esophageal reflux disease without esophagitis: Secondary | ICD-10-CM | POA: Diagnosis present

## 2023-05-21 DIAGNOSIS — Z79899 Other long term (current) drug therapy: Secondary | ICD-10-CM | POA: Diagnosis not present

## 2023-05-21 LAB — COMPREHENSIVE METABOLIC PANEL
ALT: 13 U/L (ref 0–44)
AST: 12 U/L — ABNORMAL LOW (ref 15–41)
Albumin: 3.6 g/dL (ref 3.5–5.0)
Alkaline Phosphatase: 82 U/L (ref 38–126)
Anion gap: 9 (ref 5–15)
BUN: 42 mg/dL — ABNORMAL HIGH (ref 8–23)
CO2: 21 mmol/L — ABNORMAL LOW (ref 22–32)
Calcium: 8.8 mg/dL — ABNORMAL LOW (ref 8.9–10.3)
Chloride: 106 mmol/L (ref 98–111)
Creatinine, Ser: 2.69 mg/dL — ABNORMAL HIGH (ref 0.61–1.24)
GFR, Estimated: 24 mL/min — ABNORMAL LOW (ref >60)
Glucose, Bld: 135 mg/dL — ABNORMAL HIGH (ref 70–99)
Potassium: 3.8 mmol/L (ref 3.5–5.1)
Sodium: 136 mmol/L (ref 135–145)
Total Bilirubin: 0.3 mg/dL (ref 0.3–1.2)
Total Protein: 8 g/dL (ref 6.5–8.1)

## 2023-05-21 LAB — CBC
HCT: 33 % — ABNORMAL LOW (ref 39.0–52.0)
Hemoglobin: 10.9 g/dL — ABNORMAL LOW (ref 13.0–17.0)
MCH: 31.8 pg (ref 26.0–34.0)
MCHC: 33 g/dL (ref 30.0–36.0)
MCV: 96.2 fL (ref 80.0–100.0)
Platelets: 347 10*3/uL (ref 150–400)
RBC: 3.43 MIL/uL — ABNORMAL LOW (ref 4.22–5.81)
RDW: 13.3 % (ref 11.5–15.5)
WBC: 8.6 10*3/uL (ref 4.0–10.5)
nRBC: 0 % (ref 0.0–0.2)

## 2023-05-21 LAB — SURGICAL PCR SCREEN
MRSA, PCR: NEGATIVE
Staphylococcus aureus: NEGATIVE

## 2023-05-21 MED ORDER — OXCARBAZEPINE ER 600 MG PO TB24
600.0000 mg | ORAL_TABLET | Freq: Every day | ORAL | Status: DC
  Administered 2023-05-23 – 2023-05-28 (×6): 600 mg via ORAL

## 2023-05-21 MED ORDER — PIPERACILLIN-TAZOBACTAM 3.375 G IVPB 30 MIN
3.3750 g | INTRAVENOUS | Status: AC
  Administered 2023-05-22: 3.375 g via INTRAVENOUS
  Filled 2023-05-21 (×2): qty 50

## 2023-05-21 MED ORDER — AMLODIPINE BESYLATE 5 MG PO TABS
5.0000 mg | ORAL_TABLET | Freq: Every day | ORAL | Status: DC
  Administered 2023-05-21 – 2023-05-27 (×7): 5 mg via ORAL
  Filled 2023-05-21 (×7): qty 1

## 2023-05-21 MED ORDER — NEOMYCIN SULFATE 500 MG PO TABS
500.0000 mg | ORAL_TABLET | ORAL | Status: AC
  Administered 2023-05-21 – 2023-05-22 (×3): 500 mg via ORAL
  Filled 2023-05-21 (×3): qty 1

## 2023-05-21 MED ORDER — LEVETIRACETAM 500 MG PO TABS
500.0000 mg | ORAL_TABLET | Freq: Three times a day (TID) | ORAL | Status: DC
  Administered 2023-05-21 – 2023-05-29 (×22): 500 mg via ORAL
  Filled 2023-05-21 (×22): qty 1

## 2023-05-21 MED ORDER — ALVIMOPAN 12 MG PO CAPS
12.0000 mg | ORAL_CAPSULE | ORAL | Status: AC
  Administered 2023-05-22: 12 mg via ORAL
  Filled 2023-05-21: qty 1

## 2023-05-21 MED ORDER — DOXEPIN HCL 25 MG PO CAPS
25.0000 mg | ORAL_CAPSULE | Freq: Every day | ORAL | Status: DC
  Administered 2023-05-21 – 2023-05-28 (×8): 25 mg via ORAL
  Filled 2023-05-21 (×8): qty 1

## 2023-05-21 MED ORDER — MUPIROCIN 2 % EX OINT
1.0000 | TOPICAL_OINTMENT | Freq: Two times a day (BID) | CUTANEOUS | Status: DC
  Administered 2023-05-21: 1 via NASAL
  Filled 2023-05-21: qty 22

## 2023-05-21 MED ORDER — PEG 3350-KCL-NA BICARB-NACL 420 G PO SOLR
4000.0000 mL | Freq: Once | ORAL | Status: AC
  Administered 2023-05-21: 4000 mL via ORAL

## 2023-05-21 MED ORDER — METRONIDAZOLE 500 MG PO TABS
500.0000 mg | ORAL_TABLET | ORAL | Status: AC
  Administered 2023-05-21 – 2023-05-22 (×3): 500 mg via ORAL
  Filled 2023-05-21 (×3): qty 1

## 2023-05-21 MED ORDER — PRIMIDONE 50 MG PO TABS
100.0000 mg | ORAL_TABLET | Freq: Every day | ORAL | Status: DC
  Administered 2023-05-21 – 2023-05-28 (×8): 100 mg via ORAL
  Filled 2023-05-21 (×8): qty 2

## 2023-05-21 MED ORDER — FLUTICASONE FUROATE-VILANTEROL 100-25 MCG/ACT IN AEPB
1.0000 | INHALATION_SPRAY | Freq: Every day | RESPIRATORY_TRACT | Status: DC
  Administered 2023-05-22 – 2023-05-29 (×8): 1 via RESPIRATORY_TRACT
  Filled 2023-05-21: qty 28

## 2023-05-21 MED ORDER — SODIUM CHLORIDE 0.9 % IV SOLN: INTRAVENOUS | Status: DC

## 2023-05-21 NOTE — Consult Note (Signed)
WOC consult for stoma marking. This patient was marked for ileal conduit in Ostomy Clinic by Mike Gip, FNP.  Please see her note 05/20/2023.   WOC team will follow postop for ostomy education and support.   Thank you,    Priscella Mann MSN, RN-BC, [employer] [phone]

## 2023-05-21 NOTE — Anesthesia Preprocedure Evaluation (Signed)
Anesthesia Evaluation  Patient identified by MRN, date of birth, ID band Patient awake    Reviewed: Allergy & Precautions, H&P , NPO status , Patient's Chart, lab work & pertinent test results  Airway Mallampati: II  TM Distance: >3 FB Neck ROM: Full    Dental no notable dental hx.    Pulmonary former smoker   Pulmonary exam normal breath sounds clear to auscultation       Cardiovascular hypertension, Pt. on medications Normal cardiovascular exam Rhythm:Regular Rate:Normal     Neuro/Psych Seizures - (last one 18 months ago), Well Controlled,   negative psych ROS   GI/Hepatic Neg liver ROS,GERD  Medicated and Controlled,,  Endo/Other  negative endocrine ROS    Renal/GU Renal InsufficiencyRenal diseaseLab Results      Component                Value               Date                               K                        3.8                 05/21/2023                CO2                      21 (L)              05/21/2023                BUN                      42 (H)              05/21/2023                CREATININE               2.69 (H)            05/21/2023                   Prostate CA negative genitourinary   Musculoskeletal  (+) Arthritis ,    Abdominal   Peds negative pediatric ROS (+)  Hematology negative hematology ROS (+) Lab Results      Component                Value               Date                      WBC                      8.6                 05/21/2023                HGB                      10.9 (L)            05/21/2023  HCT                      33.0 (L)            05/21/2023                MCV                      96.2                05/21/2023                PLT                      347                 05/21/2023              Anesthesia Other Findings All: Sulfa  Reproductive/Obstetrics negative OB ROS                             Anesthesia  Physical Anesthesia Plan  ASA: 3  Anesthesia Plan: General   Post-op Pain Management: Minimal or no pain anticipated, Ketamine IV*, Lidocaine infusion* and Toradol IV (intra-op)*   Induction: Intravenous  PONV Risk Score and Plan: 2 and Ondansetron, Dexamethasone and Treatment may vary due to age or medical condition  Airway Management Planned: Oral ETT  Additional Equipment: None  Intra-op Plan:   Post-operative Plan: Extubation in OR  Informed Consent: I have reviewed the patients History and Physical, chart, labs and discussed the procedure including the risks, benefits and alternatives for the proposed anesthesia with the patient or authorized representative who has indicated his/her understanding and acceptance.     Dental advisory given  Plan Discussed with: CRNA  Anesthesia Plan Comments: (2 x 18g IV)       Anesthesia Quick Evaluation

## 2023-05-21 NOTE — Discharge Instructions (Signed)

## 2023-05-21 NOTE — H&P (Signed)
Wesley [name] is an 72 y.o. male.    Chief Complaint: Pre-Op Cystectomy  HPI:   1 - Moderate Risk Prostate Cancer - s/p brachytherapy / external beam radiation 2010 for Grade 2 disease. 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 Bilateral Malignatn Hydro as well making stage 3. No adenopathy or chest lesions. Denied neoadjuvant chemo be med-on (Dr. Arbutus Ped 02/2023). Felt non-ideal cancdiate for additioan radiation as anticiparted and thiat could worsen hydro (Dr. Kathrynn Running 02/2023).   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 CT 2024 wtirh bilateral hydro to bladder tumor. L JJ stent / Rt neph tube placed. Antegarde ureteroscopy 03/2023 confirms high grade distal obstruction on Rt.   4 - Urolithiasis - RLP 8mm and 4mm stones on CT 2024. Treated with left PCNL 03/2023 in effort to get stone free prior to cystectomy.   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 has h/o pelvic radiation.   PMH sig for lap hiatal hernia repair, crani for meningioma (no deficits). 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 Moved to [place] to be close to wife's family. His HIs wife Wesley [name] is involved. PCP is [name] in [place].   Today " Wesley [name] " is seen as pre-op admission for major extirpative surgery for bladder / prostate cancer. Cr 2.6 (approximate baseline), Hgb 10.9.    Past Medical History:  Diagnosis Date   Arthritis    Atypical chest pain    cardiologist-- dr Leeann Must;  chronic occasional,  pressure / discomfort, LOV 07/14/21 (as of 06/08/22)   Bladder calculus    BPH with obstruction/lower urinary tract symptoms    Follows with Dr. Kasandra Knudsen, urologist.   Complex partial seizure [facility])    followed by neurologist--- dr d. 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 Daphane Shepherd at [facility] neurology, LOV w/ FNP on 03/21/22 in Epic ( as of 06/08/22)   Generalized abdominal pain    w/  bloating due to UC  take doxepin   GERD (gastroesophageal reflux disease)    H/O urinary retention    History of bladder stone    History of hiatal hernia    History of kidney stones    History of prostate cancer 2010   urologist--- dr pace;   dx 2010,  Gleason 4+3, PSA 5.2;   03-04-2009  s/p radiactive prostate seed implants and external beam radiation   History of repair of hiatal hernia    02-11-2017  s/p lap paraesophageal hernia repair w/ nissen fundoplication   History of skin cancer    Hx of resection of meningioma 02/11/2009   benign right temperal lobe meningioma s/p resection  (residual hearing loss)   (neurosurgeon-- dr Tyrone Sage)   Hypertension    followed by cardiologist and pcp   Insomnia    Migraine with aura and without status migrainosus, not intractable    neurologist-- dr Marlena Clipper   Prostate cancer [facility])    Simple renal cyst    bilateral ;   followed by urologist   Ulcerative colitis (HCC)    followed by dr v. Rhetta Mura (GI);  dx 2010;  current treatment stelara q8wks, LOV 06/07/22 (as of 06/08/22)   Wears glasses     Past Surgical History:  Procedure Laterality Date   BOTOX INJECTION N/A 01/29/2023   Procedure: BOTOX INJECTION 100 UNITS;  Surgeon: Noel Christmas, MD;  Location: [facility];  Service: Urology;  Laterality: N/A;   BRAIN MENINGIOMA EXCISION  02/11/2009   @[facility] ;  craniotomy , right temporal medial sphonoid wing resection   CARPAL TUNNEL RELEASE Bilateral    right 09-05-2016  @[facility] ;   left 2013   COLONOSCOPY  12/2019   COLONOSCOPY  01/2022   CYSTOSCOPY  03/05/2012   Procedure: CYSTOSCOPY;  Surgeon: Antony Haste, MD;  Location: [facility];  Service: Urology;  Laterality: N/A;  Cysto Clot  Evacuation and Fulgeration Of [place] ([place])   CYSTOSCOPY WITH LITHOLAPAXY N/A 03/13/2019   Procedure: CYSTOSCOPY WITH lASER LITHOLAPAXY, DILATION OF STRICTURE;  Surgeon: Ihor Gully, MD;  Location: [facility];  Service: Urology;  Laterality: N/A;   CYSTOSCOPY WITH LITHOLAPAXY N/A 01/02/2022   Procedure: CYSTOSCOPY WITH LITHOTRIPSY;  Surgeon: Noel Christmas, MD;  Location: [facility];  Service: Urology;  Laterality: N/A;   CYSTOSCOPY WITH RETROGRADE PYELOGRAM, URETEROSCOPY AND STENT PLACEMENT Right 03/13/2022   Procedure: CYSTOSCOPY WITH RETROGRADE PYELOGRAM, URETEROSCOPY AND STENT PLACEMENT;  Surgeon: Noel Christmas, MD;  Location[facility];  Service: Urology;  Laterality: Right;  75 MINS   CYSTOSCOPY WITH RETROGRADE PYELOGRAM, URETEROSCOPY AND STENT PLACEMENT Right 08/07/2022   Procedure: CYSTOSCOPY WITH RETROGRADE PYELOGRAM, URETEROSCOPY AND STENT PLACEMENT, REMOVAL OF NEPHROURETERAL STENT;  Surgeon: Noel Christmas, MD;  Location: [facility];  Service: Urology;  Laterality: Right;  75 MINS   CYSTOSCOPY WITH URETHRAL DILATATION N/A 08/23/2020   Procedure: CYSTOSCOPY WITH URETHRAL DILATATION, BLADDER BIOPSY, FULGERATION;  Surgeon: Noel Christmas, MD;  Location: [facility];  Service: Urology;  Laterality: N/A;  1 HR   CYSTOSCOPY WITH URETHRAL DILATATION N/A 03/13/2021   Procedure: CYSTOSCOPY WITH URETHRAL BALLOON DILATATION/ FOLEY CATHETER PLACEMENT;  Surgeon: Noel Christmas, MD;  [facility];  Service: Urology;  Laterality: N/A;   CYSTOSCOPY WITH URETHRAL DILATATION Left 07/08/2009   @[facility] :   with left  RTG   CYSTOSCOPY WITH URETHRAL DILATATION N/A 06/19/2022   Procedure: CYSTOSCOPY WITH OPTILUME URETHRAL DILATATION;  Surgeon: Noel Christmas, MD;  Location: [facility];  Service: Urology;  Laterality: N/A;   CYSTOSCOPY/URETEROSCOPY/HOLMIUM LASER/STENT PLACEMENT Bilateral 01/29/2023   Procedure:  DIAGNOSTIC CYSTOSCOPY , LEFT RETROGRADE PYELOGRAM, LEFT URETERAL STENT PLACEMENT, AND STONE BASKETING;  Surgeon: Noel Christmas, MD;  Location: [facility];  Service: Urology;  Laterality: Bilateral;   HOLMIUM LASER APPLICATION N/A 08/23/2020   Procedure: HOLMIUM LASER APPLICATION OF URETHRAL STONE;  Surgeon: Noel Christmas, MD;  Location: [facility];  Service: Urology;  Laterality: N/A;   HOLMIUM LASER APPLICATION Right 03/13/2022   Procedure: HOLMIUM LASER APPLICATION;  Surgeon: Noel Christmas, MD;  Location: [facility];  Service: Urology;  Laterality: Right;   HOLMIUM LASER APPLICATION Right 08/07/2022   Procedure: HOLMIUM LASER APPLICATION;  Surgeon: Noel Christmas, MD;  [facility];  Service: Urology;  Laterality: Right;   INGUINAL HERNIA REPAIR Right 05/03/2015   @[facility]    INSERTION PROSTATE RADIATION SEED  03/04/2009   @[facility]    IR NEPHROSTOMY PLACEMENT RIGHT  07/18/2022   IR URETERAL STENT RIGHT NEW ACCESS W/O SEP NEPHROSTOMY CATH  02/04/2023   KNEE CARTILAGE SURGERY  Left    1980S   LAPAROSCOPIC PARAESOPHAGEAL HERNIA REPAIR  02/11/2017   @[facility] ;  W/  NISSEN FUNDOPLICATION (MESH)   NEPHROLITHOTOMY Right 03/29/2023   Procedure: NEPHROLITHOTOMY PERCUTANEOUS;  Surgeon: Loletta Parish., MD;  Location: [facility];  Service: Urology;  Laterality: Right;  2 HRS   PROSTATE BIOPSY     TOE SURGERY     TRANSURETHRAL RESECTION OF BLADDER TUMOR N/A 01/02/2022   Procedure: TRANSURETHRAL RESECTION OF BLADDER TUMOR (TURBT);  Surgeon: Noel Christmas, MD;  Location: [facility];  Service: Urology;  Laterality: N/A;   TRANSURETHRAL RESECTION OF BLADDER TUMOR N/A 01/29/2023   Procedure: TRANSURETHRAL RESECTION OF BLADDER TUMOR (TURBT);  Surgeon: Noel Christmas, MD;  Location: [facility];  Service: Urology;  Laterality: N/A;    No family history on file. Social History:  reports that he quit smoking about 36  years ago. His smoking use included cigarettes. He started smoking about 46 years ago. He has a 10 pack-year smoking history. He has never used smokeless tobacco. He reports current alcohol use. He reports that he does not use drugs.  Allergies:  Allergies  Allergen Reactions   Sulfa Antibiotics Rash    Childhood    Medications Prior to Admission  Medication Sig Dispense Refill   amLODipine (NORVASC) 5 MG tablet Take 5 mg by mouth at bedtime.     Biotin (BIOTIN 5000) 5 MG CAPS Take 5 mg by mouth daily.     doxepin (SINEQUAN) 25 MG capsule Take 25 mg by mouth at bedtime.     ferrous sulfate 325 (65 FE) MG tablet Take 325 mg by mouth daily.     finasteride (PROSCAR) 5 MG tablet Take 5 mg by mouth daily.     fluticasone furoate-vilanterol (BREO ELLIPTA) 100-25 MCG/ACT AEPB TAKE 1 PUFF BY MOUTH EVERY DAY 60 each 3   ketoconazole (NIZORAL) 2 % cream Apply 1 Application topically daily as needed for irritation.     ketoconazole (NIZORAL) 2 % shampoo Apply 1 Application topically daily as needed (yeast).     levETIRAcetam (KEPPRA) 500 MG tablet Take 500 mg by mouth 3 (three) times daily.     MYRBETRIQ 50 MG TB24 tablet Take 50 mg by mouth every evening.     OXcarbazepine ER (OXTELLAR XR) 600 MG TB24 Take 600 mg by mouth daily in the afternoon.     primidone (MYSOLINE) 50 MG tablet Take 100 mg by mouth at bedtime.     senna-docusate (SENOKOT-S) 8.6-50 MG tablet Take 1 tablet by mouth 2 (two) times daily. While taking strong pain meds to prevent constipation. (Patient taking differently: Take 1 tablet by mouth 2 (two) times daily as needed for mild constipation.) 10 tablet 0   tamsulosin (FLOMAX) 0.4 MG CAPS capsule Take 1 capsule (0.4 mg total) by mouth daily. (Patient taking differently: Take 0.4 mg by mouth at bedtime.) 30 capsule 0   Trospium Chloride 60 MG CP24 Take 1 capsule by mouth daily.     celecoxib (CELEBREX) 100 MG capsule Take 100 mg by mouth every evening.     ustekinumab (STELARA)  90 MG/ML SOSY injection Inject 90 mg into the skin every 8 (eight) weeks.      Results for orders placed or performed during the hospital encounter of 05/21/23 (from the past 48 hour(s))  CBC     Status: Abnormal   Collection Time: 05/21/23  3:16 PM  Result Value Ref Range   WBC 8.6 4.0 -  10.5 K/uL   RBC 3.43 (L) 4.22 - 5.81 MIL/uL   Hemoglobin 10.9 (L) 13.0 - 17.0 g/dL   HCT 09.8 (L) 11.9 - 14.7 %   MCV 96.2 80.0 - 100.0 fL   MCH 31.8 26.0 - 34.0 pg   MCHC 33.0 30.0 - 36.0 g/dL   RDW 82.9 56.2 - 13.0 %   Platelets 347 150 - 400 K/uL   nRBC 0.0 0.0 - 0.2 %    Comment: Performed at [facility], [address], [place], [place] [address]  Comprehensive metabolic panel     Status: Abnormal   Collection Time: 05/21/23  3:16 PM  Result Value Ref Range   Sodium 136 135 - 145 mmol/L   Potassium 3.8 3.5 - 5.1 mmol/L   Chloride 106 98 - 111 mmol/L   CO2 21 (L) 22 - 32 mmol/L   Glucose, Bld 135 (H) 70 - 99 mg/dL    Comment: Glucose reference range applies only to samples taken after fasting for at least 8 hours.   BUN 42 (H) 8 - 23 mg/dL   Creatinine, Ser 4.69 (H) 0.61 - 1.24 mg/dL   Calcium 8.8 (L) 8.9 - 10.3 mg/dL   Total Protein 8.0 6.5 - 8.1 g/dL   Albumin 3.6 3.5 - 5.0 g/dL   AST 12 (L) 15 - 41 U/L   ALT 13 0 - 44 U/L   Alkaline Phosphatase 82 38 - 126 U/L   Total Bilirubin 0.3 0.3 - 1.2 mg/dL   GFR, Estimated 24 (L) >60 mL/min    Comment: (NOTE) Calculated using the CKD-EPI Creatinine Equation (2021)    Anion gap 9 5 - 15    Comment: Performed at [facility], [address], [place], [place] [address]   No results found.  Review of Systems  Blood pressure (!) 168/95, pulse 89, temperature 98.5 F (36.9 C), temperature source Oral, resp. rate 18, SpO2 98%. Physical Exam   Assessment/Plan  Proceed as planned with cysto/ICG, robotic cystoprostatectomy / node dissection, conduit diversion. Risks, benefits, alternatives, expectged  peri-op course discussed extensively previously and reiterated today.   Loletta Parish., MD 05/21/2023, 4:28 PM

## 2023-05-22 ENCOUNTER — Other Ambulatory Visit: Payer: Self-pay

## 2023-05-22 ENCOUNTER — Inpatient Hospital Stay (HOSPITAL_COMMUNITY): Payer: Medicare Other | Admitting: Anesthesiology

## 2023-05-22 ENCOUNTER — Inpatient Hospital Stay (HOSPITAL_COMMUNITY): Payer: Self-pay | Admitting: Anesthesiology

## 2023-05-22 ENCOUNTER — Encounter (HOSPITAL_COMMUNITY)
Admission: RE | Disposition: A | Discharge status: Home / Self Care | Payer: Self-pay | Source: Ambulatory Visit | Attending: Urology

## 2023-05-22 ENCOUNTER — Encounter (HOSPITAL_COMMUNITY): Payer: Self-pay | Admitting: Urology

## 2023-05-22 DIAGNOSIS — C679 Malignant neoplasm of bladder, unspecified: Principal | ICD-10-CM | POA: Diagnosis present

## 2023-05-22 DIAGNOSIS — I10 Essential (primary) hypertension: Secondary | ICD-10-CM

## 2023-05-22 DIAGNOSIS — Z87891 Personal history of nicotine dependence: Secondary | ICD-10-CM

## 2023-05-22 HISTORY — PX: ROBOT ASSISTED LAPAROSCOPIC RADICAL PROSTATECTOMY: SHX5141

## 2023-05-22 HISTORY — PX: LYMPH NODE DISSECTION: SHX5087

## 2023-05-22 HISTORY — PX: ROBOT ASSISTED LAPAROSCOPIC COMPLETE CYSTECT ILEAL CONDUIT: SHX5139

## 2023-05-22 HISTORY — PX: CYSTOSCOPY WITH INJECTION: SHX1424

## 2023-05-22 LAB — HEMOGLOBIN AND HEMATOCRIT, BLOOD
HCT: 31.5 % — ABNORMAL LOW (ref 39.0–52.0)
Hemoglobin: 10.4 g/dL — ABNORMAL LOW (ref 13.0–17.0)

## 2023-05-22 LAB — GLUCOSE, CAPILLARY
Glucose-Capillary: 101 mg/dL — ABNORMAL HIGH (ref 70–99)
Glucose-Capillary: 140 mg/dL — ABNORMAL HIGH (ref 70–99)

## 2023-05-22 SURGERY — CYSTECTOMY, ROBOT-ASSISTED, WITH ILEAL CONDUIT CREATION
Anesthesia: General

## 2023-05-22 MED ORDER — ACETAMINOPHEN 10 MG/ML IV SOLN
1000.0000 mg | Freq: Four times a day (QID) | INTRAVENOUS | Status: AC
  Administered 2023-05-22 – 2023-05-23 (×4): 1000 mg via INTRAVENOUS
  Filled 2023-05-22 (×4): qty 100

## 2023-05-22 MED ORDER — FENTANYL CITRATE (PF) 100 MCG/2ML IJ SOLN
INTRAMUSCULAR | Status: DC | PRN
  Administered 2023-05-22 (×4): 50 ug via INTRAVENOUS

## 2023-05-22 MED ORDER — ORAL CARE MOUTH RINSE: 15.0000 mL | OROMUCOSAL | Status: DC | PRN

## 2023-05-22 MED ORDER — DEXAMETHASONE SODIUM PHOSPHATE 10 MG/ML IJ SOLN
INTRAMUSCULAR | Status: AC
  Filled 2023-05-22: qty 1

## 2023-05-22 MED ORDER — SENNOSIDES-DOCUSATE SODIUM 8.6-50 MG PO TABS
2.0000 | ORAL_TABLET | Freq: Every day | ORAL | Status: DC
  Administered 2023-05-22 – 2023-05-26 (×5): 2 via ORAL
  Filled 2023-05-22 (×6): qty 2

## 2023-05-22 MED ORDER — OXYCODONE HCL 5 MG PO TABS
5.0000 mg | ORAL_TABLET | ORAL | Status: DC | PRN
  Administered 2023-05-23 – 2023-05-26 (×7): 5 mg via ORAL
  Filled 2023-05-22 (×7): qty 1

## 2023-05-22 MED ORDER — PROPOFOL 10 MG/ML IV BOLUS
INTRAVENOUS | Status: DC | PRN
Start: 2023-05-22 — End: 2023-05-22
  Administered 2023-05-22: 140 mg via INTRAVENOUS

## 2023-05-22 MED ORDER — DEXMEDETOMIDINE PEDS IV SYRINGE 4 MCG/ML - SIMPLE MED
8.0000 ug | Freq: Once | INTRAVENOUS | Status: DC
  Filled 2023-05-22: qty 2

## 2023-05-22 MED ORDER — DIPHENHYDRAMINE HCL 12.5 MG/5ML PO ELIX: 12.5000 mg | ORAL_SOLUTION | Freq: Four times a day (QID) | ORAL | Status: DC | PRN

## 2023-05-22 MED ORDER — SODIUM CHLORIDE (PF) 0.9 % IJ SOLN
INTRAMUSCULAR | Status: AC
  Filled 2023-05-22: qty 10

## 2023-05-22 MED ORDER — WATER FOR IRRIGATION, STERILE IR SOLN
Status: DC | PRN
  Administered 2023-05-22: 1000 mL

## 2023-05-22 MED ORDER — ALVIMOPAN 12 MG PO CAPS
12.0000 mg | ORAL_CAPSULE | Freq: Two times a day (BID) | ORAL | Status: DC
  Administered 2023-05-23 – 2023-05-27 (×10): 12 mg via ORAL
  Filled 2023-05-22 (×10): qty 1

## 2023-05-22 MED ORDER — ROCURONIUM BROMIDE 100 MG/10ML IV SOLN
INTRAVENOUS | Status: DC | PRN
  Administered 2023-05-22: 60 mg via INTRAVENOUS
  Administered 2023-05-22: 20 mg via INTRAVENOUS
  Administered 2023-05-22: 10 mg via INTRAVENOUS

## 2023-05-22 MED ORDER — SODIUM CHLORIDE 0.45 % IV SOLN: INTRAVENOUS | Status: DC

## 2023-05-22 MED ORDER — PROPOFOL 10 MG/ML IV BOLUS
INTRAVENOUS | Status: AC
  Filled 2023-05-22: qty 20

## 2023-05-22 MED ORDER — BUPIVACAINE LIPOSOME 1.3 % IJ SUSP
INTRAMUSCULAR | Status: DC | PRN
  Administered 2023-05-22: 20 mL

## 2023-05-22 MED ORDER — MIDAZOLAM HCL 5 MG/5ML IJ SOLN
INTRAMUSCULAR | Status: DC | PRN
  Administered 2023-05-22: 2 mg via INTRAVENOUS

## 2023-05-22 MED ORDER — LACTATED RINGERS IV SOLN: INTRAVENOUS | Status: DC | PRN

## 2023-05-22 MED ORDER — HYDROMORPHONE HCL 1 MG/ML IJ SOLN
INTRAMUSCULAR | Status: DC | PRN
Start: 2023-05-22 — End: 2023-05-22
  Administered 2023-05-22 (×2): .5 mg via INTRAVENOUS

## 2023-05-22 MED ORDER — FENTANYL CITRATE PF 50 MCG/ML IJ SOSY: 25.0000 ug | PREFILLED_SYRINGE | INTRAMUSCULAR | Status: DC | PRN

## 2023-05-22 MED ORDER — STERILE WATER FOR INJECTION IJ SOLN
INTRAMUSCULAR | Status: DC | PRN
  Administered 2023-05-22: 1 mL via INTRAMUSCULAR

## 2023-05-22 MED ORDER — SODIUM CHLORIDE (PF) 0.9 % IJ SOLN
INTRAMUSCULAR | Status: AC
  Filled 2023-05-22: qty 20

## 2023-05-22 MED ORDER — SUGAMMADEX SODIUM 200 MG/2ML IV SOLN
INTRAVENOUS | Status: DC | PRN
Start: 2023-05-22 — End: 2023-05-22
  Administered 2023-05-22: 200 mg via INTRAVENOUS

## 2023-05-22 MED ORDER — LACTATED RINGERS IR SOLN
Status: DC | PRN
  Administered 2023-05-22: 1000 mL

## 2023-05-22 MED ORDER — HYDROMORPHONE HCL 2 MG/ML IJ SOLN
INTRAMUSCULAR | Status: AC
  Filled 2023-05-22: qty 1

## 2023-05-22 MED ORDER — DIPHENHYDRAMINE HCL 50 MG/ML IJ SOLN: 12.5000 mg | Freq: Four times a day (QID) | INTRAMUSCULAR | Status: DC | PRN

## 2023-05-22 MED ORDER — PHENYLEPHRINE HCL (PRESSORS) 10 MG/ML IV SOLN
INTRAVENOUS | Status: DC | PRN
  Administered 2023-05-22: 100 ug via INTRAVENOUS

## 2023-05-22 MED ORDER — ONDANSETRON HCL 4 MG/2ML IJ SOLN
INTRAMUSCULAR | Status: AC
  Filled 2023-05-22: qty 2

## 2023-05-22 MED ORDER — ONDANSETRON HCL 4 MG/2ML IJ SOLN
INTRAMUSCULAR | Status: DC | PRN
  Administered 2023-05-22: 4 mg via INTRAVENOUS

## 2023-05-22 MED ORDER — PHENYLEPHRINE 80 MCG/ML (10ML) SYRINGE FOR IV PUSH (FOR BLOOD PRESSURE SUPPORT)
PREFILLED_SYRINGE | INTRAVENOUS | Status: AC
  Filled 2023-05-22: qty 10

## 2023-05-22 MED ORDER — DEXAMETHASONE SODIUM PHOSPHATE 10 MG/ML IJ SOLN
INTRAMUSCULAR | Status: DC | PRN
Start: 2023-05-22 — End: 2023-05-22
  Administered 2023-05-22: 8 mg via INTRAVENOUS

## 2023-05-22 MED ORDER — MIDAZOLAM HCL 2 MG/2ML IJ SOLN
INTRAMUSCULAR | Status: AC
  Filled 2023-05-22: qty 2

## 2023-05-22 MED ORDER — SODIUM CHLORIDE 0.9 % IR SOLN
Status: DC | PRN
  Administered 2023-05-22: 1000 mL via INTRAVESICAL

## 2023-05-22 MED ORDER — LIDOCAINE HCL (PF) 2 % IJ SOLN
INTRAMUSCULAR | Status: DC | PRN
  Administered 2023-05-22: 1.5 mg/kg/h via INTRADERMAL

## 2023-05-22 MED ORDER — ONDANSETRON HCL 4 MG/2ML IJ SOLN: 4.0000 mg | INTRAMUSCULAR | Status: DC | PRN

## 2023-05-22 MED ORDER — BUPIVACAINE LIPOSOME 1.3 % IJ SUSP
INTRAMUSCULAR | Status: AC
  Filled 2023-05-22: qty 20

## 2023-05-22 MED ORDER — ROCURONIUM BROMIDE 10 MG/ML (PF) SYRINGE
PREFILLED_SYRINGE | INTRAVENOUS | Status: AC
  Filled 2023-05-22: qty 10

## 2023-05-22 MED ORDER — ONDANSETRON HCL 4 MG/2ML IJ SOLN: 4.0000 mg | Freq: Once | INTRAMUSCULAR | Status: DC | PRN

## 2023-05-22 MED ORDER — LIDOCAINE HCL (PF) 2 % IJ SOLN
INTRAMUSCULAR | Status: AC
  Filled 2023-05-22: qty 15

## 2023-05-22 MED ORDER — PIPERACILLIN-TAZOBACTAM 3.375 G IVPB
3.3750 g | Freq: Two times a day (BID) | INTRAVENOUS | Status: AC
  Administered 2023-05-22 – 2023-05-23 (×2): 3.375 g via INTRAVENOUS
  Filled 2023-05-22 (×2): qty 50

## 2023-05-22 MED ORDER — FENTANYL CITRATE (PF) 250 MCG/5ML IJ SOLN
INTRAMUSCULAR | Status: AC
  Filled 2023-05-22: qty 5

## 2023-05-22 MED ORDER — PHENYLEPHRINE HCL-NACL 20-0.9 MG/250ML-% IV SOLN
INTRAVENOUS | Status: DC | PRN
  Administered 2023-05-22: 100 ug via INTRAVENOUS
  Administered 2023-05-22: 80 ug via INTRAVENOUS
  Administered 2023-05-22: 160 ug via INTRAVENOUS
  Administered 2023-05-22: 100 ug via INTRAVENOUS

## 2023-05-22 MED ORDER — LIDOCAINE HCL (CARDIAC) PF 100 MG/5ML IV SOSY
PREFILLED_SYRINGE | INTRAVENOUS | Status: DC | PRN
  Administered 2023-05-22: 60 mg via INTRAVENOUS

## 2023-05-22 MED ORDER — SODIUM CHLORIDE (PF) 0.9 % IJ SOLN
INTRAMUSCULAR | Status: DC | PRN
  Administered 2023-05-22: 20 mL

## 2023-05-22 MED ORDER — HYDROMORPHONE HCL 1 MG/ML IJ SOLN
0.5000 mg | INTRAMUSCULAR | Status: DC | PRN
  Administered 2023-05-22 – 2023-05-26 (×12): 1 mg via INTRAVENOUS
  Filled 2023-05-22 (×12): qty 1

## 2023-05-22 MED ORDER — ACETAMINOPHEN 10 MG/ML IV SOLN: 1000.0000 mg | Freq: Once | INTRAVENOUS | Status: DC | PRN

## 2023-05-22 SURGICAL SUPPLY — 130 items
ADH SKN CLS APL DERMABOND .7 (GAUZE/BANDAGES/DRESSINGS) ×4
AGENT HMST KT MTR STRL THRMB (HEMOSTASIS)
APL ESCP 34 STRL LF DISP (HEMOSTASIS)
APL PRP STRL LF DISP 70% ISPRP (MISCELLANEOUS) ×2
APL SKNCLS STERI-STRIP NONHPOA (GAUZE/BANDAGES/DRESSINGS)
APL SWBSTK 6 STRL LF DISP (MISCELLANEOUS) ×2
APPLICATOR COTTON TIP 6 STRL (MISCELLANEOUS) ×2 IMPLANT
APPLICATOR COTTON TIP 6IN STRL (MISCELLANEOUS) ×2
APPLICATOR SURGIFLO ENDO (HEMOSTASIS) IMPLANT
BAG COUNTER SPONGE SURGICOUNT (BAG) IMPLANT
BAG LAPAROSCOPIC 12 15 PORT 16 (BASKET) ×2 IMPLANT
BAG RETRIEVAL 12/15 (BASKET) ×2
BAG SPNG CNTER NS LX DISP (BAG)
BAG URO CATCHER STRL LF (MISCELLANEOUS) ×2 IMPLANT
BENZOIN TINCTURE PRP APPL 2/3 (GAUZE/BANDAGES/DRESSINGS) IMPLANT
BLADE SURG SZ10 CARB STEEL (BLADE) IMPLANT
CATH FOLEY 2WAY SLVR 18FR 30CC (CATHETERS) ×2 IMPLANT
CATH SILICONE 5CC 18FR (INSTRUMENTS) ×2 IMPLANT
CATH TIEMANN FOLEY 18FR 5CC (CATHETERS) ×2 IMPLANT
CELLS DAT CNTRL 66122 CELL SVR (MISCELLANEOUS) ×2 IMPLANT
CHLORAPREP W/TINT 26 (MISCELLANEOUS) ×2 IMPLANT
CLIP LIGATING HEM O LOK PURPLE (MISCELLANEOUS) ×4 IMPLANT
CLIP LIGATING HEMO LOK XL GOLD (MISCELLANEOUS) ×4 IMPLANT
CLIP LIGATING HEMO O LOK GREEN (MISCELLANEOUS) ×2 IMPLANT
CLOTH BEACON ORANGE TIMEOUT ST (SAFETY) ×2 IMPLANT
CNTNR URN SCR LID CUP LEK RST (MISCELLANEOUS) ×2 IMPLANT
CONT SPEC 4OZ STRL OR WHT (MISCELLANEOUS) ×2
COVER SURGICAL LIGHT HANDLE (MISCELLANEOUS) ×2 IMPLANT
COVER TIP SHEARS 8 DVNC (MISCELLANEOUS) ×2 IMPLANT
CUTTER ECHEON FLEX ENDO 45 340 (ENDOMECHANICALS) ×2 IMPLANT
DERMABOND ADVANCED .7 DNX12 (GAUZE/BANDAGES/DRESSINGS) ×4 IMPLANT
DRAIN CHANNEL RND F F (WOUND CARE) IMPLANT
DRAIN PENROSE 0.5X18 (DRAIN) IMPLANT
DRAPE ARM DVNC X/XI (DISPOSABLE) ×8 IMPLANT
DRAPE COLUMN DVNC XI (DISPOSABLE) ×2 IMPLANT
DRAPE SURG IRRIG POUCH 19X23 (DRAPES) ×2 IMPLANT
DRIVER NDL LRG 8 DVNC XI (INSTRUMENTS) ×4 IMPLANT
DRIVER NDLE LRG 8 DVNC XI (INSTRUMENTS) ×4 IMPLANT
DRSG TEGADERM 4X4.75 (GAUZE/BANDAGES/DRESSINGS) ×2 IMPLANT
ELECT PENCIL ROCKER SW 15FT (MISCELLANEOUS) ×2 IMPLANT
ELECT REM PT RETURN 15FT ADLT (MISCELLANEOUS) ×2 IMPLANT
FORCEPS BPLR LNG DVNC XI (INSTRUMENTS) ×2 IMPLANT
FORCEPS PROGRASP DVNC XI (FORCEP) ×2 IMPLANT
GAUZE 4X4 16PLY ~~LOC~~+RFID DBL (SPONGE) IMPLANT
GAUZE SPONGE 2X2 8PLY STRL LF (GAUZE/BANDAGES/DRESSINGS) IMPLANT
GAUZE SPONGE 4X4 12PLY STRL (GAUZE/BANDAGES/DRESSINGS) ×2 IMPLANT
GAUZE XEROFORM 1X8 LF (GAUZE/BANDAGES/DRESSINGS) IMPLANT
GLOVE BIO SURGEON STRL SZ 6.5 (GLOVE) ×4 IMPLANT
GLOVE BIOGEL PI IND STRL 7.5 (GLOVE) ×2 IMPLANT
GLOVE SURG LX STRL 7.5 STRW (GLOVE) ×6 IMPLANT
GOWN SRG XL LVL 4 BRTHBL STRL (GOWNS) ×2 IMPLANT
GOWN STRL NON-REIN XL LVL4 (GOWNS) ×2
GOWN STRL REUS W/ TWL XL LVL3 (GOWN DISPOSABLE) ×6 IMPLANT
GOWN STRL REUS W/TWL XL LVL3 (GOWN DISPOSABLE) ×6
GOWN STRL SURGICAL XL XLNG (GOWN DISPOSABLE) ×2 IMPLANT
HOLDER FOLEY CATH W/STRAP (MISCELLANEOUS) ×2 IMPLANT
IRRIG SUCT STRYKERFLOW 2 WTIP (MISCELLANEOUS) ×2
IRRIGATION SUCT STRKRFLW 2 WTP (MISCELLANEOUS) ×2 IMPLANT
IV LACTATED RINGERS 1000ML (IV SOLUTION) ×2 IMPLANT
KIT PROCEDURE DVNC SI (MISCELLANEOUS) ×2 IMPLANT
KIT TURNOVER KIT A (KITS) IMPLANT
LOOP VESSEL MAXI BLUE (MISCELLANEOUS) ×2 IMPLANT
MANIFOLD NEPTUNE II (INSTRUMENTS) ×2 IMPLANT
NDL ASPIRATION 22 (NEEDLE) ×2 IMPLANT
NDL INSUFFLATION 14GA 120MM (NEEDLE) ×2 IMPLANT
NDL SPNL 22GX7 QUINCKE BK (NEEDLE) ×2 IMPLANT
NEEDLE ASPIRATION 22 (NEEDLE) ×2 IMPLANT
NEEDLE INSUFFLATION 14GA 120MM (NEEDLE) ×2 IMPLANT
NEEDLE SPNL 22GX7 QUINCKE BK (NEEDLE) ×2 IMPLANT
PACK CYSTO (CUSTOM PROCEDURE TRAY) ×2 IMPLANT
PACK ROBOT UROLOGY CUSTOM (CUSTOM PROCEDURE TRAY) ×2 IMPLANT
PAD POSITIONING PINK XL (MISCELLANEOUS) ×2 IMPLANT
PLUG CATH AND CAP STRL 200 (CATHETERS) ×2 IMPLANT
PORT ACCESS TROCAR AIRSEAL 12 (TROCAR) ×2 IMPLANT
RELOAD STAPLE 45 4.1 GRN THCK (STAPLE) ×2 IMPLANT
RELOAD STAPLE 60 2.6 WHT THN (STAPLE) ×6 IMPLANT
RELOAD STAPLE 60 4.1 GRN THCK (STAPLE) ×6 IMPLANT
RELOAD STAPLER GREEN 60MM (STAPLE) ×8 IMPLANT
RELOAD STAPLER WHITE 60MM (STAPLE) ×14 IMPLANT
RETRACTOR LONRSTAR 16.6X16.6CM (MISCELLANEOUS) IMPLANT
RETRACTOR STAY HOOK 5MM (MISCELLANEOUS) IMPLANT
RETRACTOR STER APS 16.6X16.6CM (MISCELLANEOUS)
RETRACTOR WND ALEXIS 18 MED (MISCELLANEOUS) ×2 IMPLANT
RTRCTR WOUND ALEXIS 18CM MED (MISCELLANEOUS) ×2
SCISSORS MNPLR CVD DVNC XI (INSTRUMENTS) ×2 IMPLANT
SEAL UNIV 5-12 XI (MISCELLANEOUS) ×8 IMPLANT
SET CYSTO W/LG BORE CLAMP LF (SET/KITS/TRAYS/PACK) IMPLANT
SET TRI-LUMEN FLTR TB AIRSEAL (TUBING) ×2 IMPLANT
SOL ELECTROSURG ANTI STICK (MISCELLANEOUS) ×2
SOL PREP POV-IOD 4OZ 10% (MISCELLANEOUS) ×2 IMPLANT
SOLUTION ELECTROSURG ANTI STCK (MISCELLANEOUS) ×2 IMPLANT
SPIKE FLUID TRANSFER (MISCELLANEOUS) ×2 IMPLANT
SPONGE T-LAP 4X18 ~~LOC~~+RFID (SPONGE) ×2 IMPLANT
STAPLE RELOAD 45 GRN (STAPLE) ×6 IMPLANT
STAPLER ECHELON LONG 60 440 (INSTRUMENTS) ×2 IMPLANT
STAPLER RELOAD GREEN 60MM (STAPLE) ×8
STAPLER RELOAD WHITE 60MM (STAPLE) ×14
STENT SET URETHERAL LEFT 7FR (STENTS) ×2 IMPLANT
STENT SET URETHERAL RIGHT 7FR (STENTS) ×2 IMPLANT
SURGIFLO W/THROMBIN 8M KIT (HEMOSTASIS) IMPLANT
SUT CHROMIC 4 0 RB 1X27 (SUTURE) ×2 IMPLANT
SUT ETHILON 3 0 PS 1 (SUTURE) ×2 IMPLANT
SUT MNCRL AB 4-0 PS2 18 (SUTURE) ×4 IMPLANT
SUT PDS AB 1 CT1 27 (SUTURE) ×6 IMPLANT
SUT SILK 3 0 SH 30 (SUTURE) IMPLANT
SUT SILK 3 0 SH CR/8 (SUTURE) ×2 IMPLANT
SUT VIC AB 2-0 CT1 27 (SUTURE)
SUT VIC AB 2-0 CT1 27XBRD (SUTURE) IMPLANT
SUT VIC AB 2-0 SH 18 (SUTURE) IMPLANT
SUT VIC AB 2-0 SH 27 (SUTURE) ×2
SUT VIC AB 2-0 SH 27X BRD (SUTURE) ×2 IMPLANT
SUT VIC AB 2-0 UR5 27 (SUTURE) ×8 IMPLANT
SUT VIC AB 3-0 SH 27 (SUTURE) ×12
SUT VIC AB 3-0 SH 27X BRD (SUTURE) ×4 IMPLANT
SUT VIC AB 3-0 SH 27XBRD (SUTURE) ×8 IMPLANT
SUT VIC AB 4-0 RB1 27 (SUTURE) ×8
SUT VIC AB 4-0 RB1 27XBRD (SUTURE) ×8 IMPLANT
SUT VICRYL 0 UR6 27IN ABS (SUTURE) ×2 IMPLANT
SUT VLOC BARB 180 ABS3/0GR12 (SUTURE) ×6
SUTURE VLOC BRB 180 ABS3/0GR12 (SUTURE) ×6 IMPLANT
SYR 27GX1/2 1ML LL SAFETY (SYRINGE) ×2 IMPLANT
SYR CONTROL 10ML LL (SYRINGE) IMPLANT
SYS KII OPTICAL ACCESS 15MM (TROCAR) ×2
SYSTEM KII OPTICAL ACCESS 15MM (TROCAR) ×2 IMPLANT
SYSTEM UROSTOMY GENTLE TOUCH (WOUND CARE) ×2 IMPLANT
TOWEL OR NON WOVEN STRL DISP B (DISPOSABLE) ×2 IMPLANT
TROCAR Z-THREAD FIOS 5X100MM (TROCAR) IMPLANT
TUBING CONNECTING 10 (TUBING) ×2 IMPLANT
WATER STERILE IRR 1000ML POUR (IV SOLUTION) ×2 IMPLANT
WATER STERILE IRR 3000ML UROMA (IV SOLUTION) ×2 IMPLANT

## 2023-05-22 NOTE — Op Note (Signed)
NAME: Wesley [name], Wesley [name] MEDICAL RECORD NO: [id number] ACCOUNT NO: [id number] DATE OF BIRTH: Nov 27, 1950 FACILITY: [facility] LOCATION: [facility] PHYSICIAN: Sebastian Ache, MD  Operative Report   DATE OF PROCEDURE: 05/22/2023  PREOPERATIVE DIAGNOSIS:  High-grade clinical stage III bladder cancer.  POSTOPERATIVE DIAGNOSIS:  High-grade clinical stage III bladder cancer.  PROCEDURE PERFORMED:   1.  Cystoscopy with injection of ICG dye. 2.  Robotic-assisted laparoscopic radical cystoprostatectomy, bilateral pelvic lymph node dissection and ileal conduit urinary diversion.  ESTIMATED BLOOD LOSS:  200 mL.  COMPLICATIONS:  None.  SPECIMENS:   1.  Peritoneal nodule.  Frozen section positive for carcinoma. 2.  Left external iliac lymph nodes.  Frozen section negative for carcinoma. 3.  Right distal ureteral margin. 4.  Left distal ureteral margin. 5.  Bladder plus postural prostate en bloc with left distal clip. 6. Bilateral permanent pelvic lymph nodes  ASSISTANT:  Harrie Foreman, PA  DRAINS:   1.  Jackson-Pratt drain to bulb suction. 2.  Right lower quadrant urostomy with right (red) and left (blue) bander stents to gravity drainage.  FINDINGS:   1.  Large volume necrotic-appearing tumor in the area of the bladder neck with cystoscopy.  Ureteral orifices essentially obliterated. 2.  Left ureteral stent in expected position. 3.  No evidence of sentinel lymph nodes within the pelvis with sentinel lymphangiography. 4.  Some evidence of locally advanced disease with some small areas of peritoneal carcinomatosis on the posterior peritoneal surface, the bladder and prostate. 5.  Significant desmoplastic reaction around the bladder neck and prostate consistent with prior radiation and aggressive tumor.  INDICATIONS:  The patient is an extremely pleasant 72 year old man with remote history of adenocarcinoma of the prostate, status post brachytherapy years ago.  He subsequently was found to have  clinical stage III bladder cancer with significant right  greater than left hydronephrosis, malignant in nature.  There were no overtly metastatic disease.  He has some small right renal stones.  Options were discussed for management including curative and noncurative pathways.  He also had I think medical  oncology which felt that he was not a candidate for any sort of neoadjuvant therapy given his poor kidney function with creatinine approximately baseline to even after decompression bilaterally with left stent and right nephrostomy tube.  He was referred  for consideration of surgical therapy and aggressive approach with cystoprostatectomy, urinary diversion was entertained. Understanding that surgery alone would certainly less oncologically sound without neoadjuvant chemotherapy, however, given his  unique clinical situation certainly reasonable. Also has significant symptoms with ongoing hematuria near total incontinence, he is quite miserable from this and he wished to proceed with surgical extirpation.  He had his right renal stones addressed  with antegrade ureteroscopy and percutaneous nephrostolithotomy approximately a month ago, which rendered the right side stone free prior to urinary diversion that went quite well.  He was admitted yesterday for bowel prep stomal marking and labs, which  were all completed, preparation for cystoprostatectomy today.  Informed consent was obtained and placed in medical record.  PROCEDURE IN DETAIL:  The patient being identified and verified.  Procedure being robotic cystoprostatectomy with lymph node dissection and conduit diversion, cystoscopy with dye injection was confirmed.  Procedure timeout was performed.  Intravenous  antibiotics were administered.  General endotracheal anesthesia induced.  His in situ, right nephrostomy tube was capped.  He was placed into a low lithotomy position.  Sterile field was created, prepped and draped the patient's penis,  perineum, and  proximal thighs using iodine  and his infraxiphoid using chlorhexidine gluconate after he was clipper shaven and after he was further fastened to operating table using 3-inch tape with foam padding across supraxiphoid chest.  His arms were tucked to the  side with gel rolls.  A test of steep Trendelenburg positioning was performed found to be suitably positioned.  An LAVH type drape was placed.  Cystourethroscopy was performed using a 24-French injection scope set with 0-degree lens.  Inspection of the  urethra unfortunately revealed no strictures at this time, However, in the area of the prostatic urethra, there was some nodular tissue in the area of the bladder neck, there was a large amount of necrotic tissue consistent with a probable necrotic  tumor.  The only area whatsoever within the bladder that appeared any sort of normal was the bladder dome area and 2 mL of Indocyanine green dye was injected across several submucosal blebs near the bladder neck, presumed dominant tumor location and 1  small area near initial bladder dome mucosa was somewhat more normal. In situ stent on the left side was visualized and felt to be in appropriate position and a silicone catheter was placed per urethra to straight drain.  Next, a high flow, low pressure  pneumoperitoneum was obtained using Veress technique in the supraumbilical midline having passed the aspiration drop test.  An 8 mm robotic camera port was placed in the same location.  Laparoscopic examination peritoneal cavity no significant adhesions,  no visceral injury.  There was a large amount of sigmoid redundancy as anticipated.  Additional ports were placed as follows:  Right paramedian 8 mm robotic port, right far lateral 12 mm assistant port, right paramedian 15 mm assistant port at the site  previously marked stomal site, left paramedian 8 mm robotic port, left far lateral 8 mm robotic port.  Robot was docked and passed the electronic  checks.  Initial attention was directed at the left retroperitoneal dissection.  Incision made lateral to  the descending colon from the iliac vessel superiorly just approximately 18 cm and inferiorly coursing along the iliac vessels, but just lateral to the medial umbilical ligament towards the anterior abdominal wall, creating a large retroperitoneal flap,  which was retracted medially.  Left ureter was encountered as it coursed over the iliac vessels it was quite hydronephrotic and a significant desmoplastic reaction around this.  It was very carefully mobilized and circumferentially marked with vessel  loop, dissected proximally to the area of the gonadal crossing and then distally towards the ureterovesical junction. Towards the area of the ureterovesical junction the posterior surface of the peritoneum containing area very firm nodular tissue that  was quite concerned for peritoneal carcinomatosis.  Again, there were several areas of this that all appeared to be in contiguous nature to the posterior surface of the bladder and prostate.  There were no obvious gross tumor on all the peritoneal  surfaces.  There was also a left common iliac lymph node that was somewhat prominent.  Given these findings, I was quite concerned about locally advanced or metastatic disease and obtained frozen section of the peritoneal nodule and the borderline lymph  node. Lymph node was negative for carcinoma on frozen.  However, the peritoneal nodule was positive.  I contacted the patient's wife, [name] by phone.  We had a frank conversation about goals of care.  I informed her that given the limited peritoneal  carcinomatosis I feel the chances of achieving care with surgery today was essentially nil.  With that  proceeding may offer some palliative benefit in terms of freedom from hematuria and leakage, but within, risk of surgery and lengthy recovery.  Next I  discussed frankly options of stopping the surgery versus  proceeding at this point as we had burned no bridges thus far. They adamantly said she wished to proceed knowing the patient's goals of care and she directly informed me to remove as much tumor as  safely possible and proceed with diversion. As such, the left ureter was transected at the ureterovesical junction, placing an extra-large Hem-o-lok clip distal. I tagged one with a blue load suture proximal.  This was purposely transected the in situ  stent, distal end remaining in the bladder and additional lymphatic performed on left side of the left external iliac group with boundaries being left external iliac artery, vein, pelvic sidewall.  Lymphostasis was achieved with cold clips.  Left  obturator group also containing left internal iliac lymph nodes were dissected free boundaries being left external iliac vein, pelvic sidewall, obturator nerve.  Lymphostasis was achieved with cold clips and there was essentially no additional lymphatic  tissue within the template fields on the left side. The left lateral bladder was swept away from pelvic sidewall towards the area of the endopelvic fascia on the left side.  There was some desmoplastic reaction around the cervix planes were certainly  safe towards the area of the presumed prostatic apex.  Attention was then directed at right sided dissection.  First, the area of the ileocecal junction was identified and terminal ileum was traced proximally for a distance of approximately 20 cm to the  area of bowel that appeared to be a suitable vascularity and mobility for conduit formation.  This was marked using silk tag clip with a clip distal to this to denote proximal, distal orientation.  Incision was made lateral to the ascending colon near  the area of the cecum along the presumed course of the ureter and iliac vessels towards the area of the right medial umbilical ligament and lateral to this to the anterior abdominal wall.  This created a large right sided  retroperitoneal flap and a  Y-shaped extension was created along the iliac vessels towards the area of the aortic bifurcation.  This retracted medially.  The right ureter was encountered as it coursed over the iliac vessels.  This was essentially normal caliber and without  significant desmoplasia unlike the left side. This was marked with vessel loop.  We then dissected distally to the ureterovesical junction, which doubly clipped and ligated, proximal clip containing white tagged suture and then proximally into the area  of the gonadal crossing and took that out of the true pelvis.  Right sided lymphadenectomy was performed of the right common iliac group, right external iliac group, right obturator and internal iliac groups respectively.  All of which were set aside for  permanent pathology and the right bladder wall swept with pelvic sidewall towards the area of the apex of the prostate on the right side.  Again, there was some desmoplasia around these planes that appeared to be quite safe. The area of the aortic  bifurcation was visualized and a retroperitoneal tunnel was created just anterior to this from the right side to the left side and left ureter was brought through this tunnel to the right side and the left ureter, right ureter, terminal ileum tag sutures  were placed into a clip and set aside in the right lower quadrant for later urinary diversion.  Attention  was then directed at posterior dissection as the posterior peritoneum on the bladder and prostate contained some gross carcinomatosis, I felt that  additional posterior dissection with a peritoneal flap would be not prudent and not consistent with a goals of maximal extirpation as such, a small incision was made in the cul-de-sac between the anterior surface of the rectum and the posterior apical  portion of the prostate just below the area of the seminal vesicles, vas deferens insertion to denote a marker plane for the posterior aspect.   Exquisite care was taken to dissected this for approximately 1-2 cm and inferiorly staying within the  perirectal fat. I was quite happy with the safety of this dissection.  This exposed the vascular pedicles of the bladder and prostate, was controlled in a purposely very wide fashion, thus encompassing all areas of visible posterior peritoneal  carcinomatosis.  This was performed using white load stapler x2 each side, I was quite happy with the safety and oncologic completeness of this. The space of Retzius was then developed between the medial umbilical ligaments towards the area of the apex  of the prostate. There did appear to be an area of gross necrosis and breakdown of the bladder neck anteriorly just brought near the pubic bone.  This completely unclear whether this represented radiation necrosis versus area of active tumor and any  sites of this necrotic phenomena in the posterior surface of the pelvic bone were cauterized extensively in case there was tumor in this location.  Dissection proceeded down to the area of the membranous urethra was transected coldly. In situ Foley  catheter was doubly clipped, ligated and used a proximal bucket handle and purposeful retrograde dissection was performed and I felt it will be the safest plane between the adenoma plane of the prostate.  This was approximately 2-3 cm back towards the  area of the previously noted posterior plane. By using a lateral approach these 2 planes were connected quite safely and was clearly away from the area of the rectum.  This completely freed up the en bloc bladder, distal ureter concerning left ureteral  stent, in situ Foley catheter and the vast majority of the prostate en bloc was placed into the large EndoCatch bag for later retrieval.  Next, laparoscopic vision digital rectal exam was performed which corroborated no evidence of rectal injury.  I was  quite happy with this.  We achieved the goals of extirpated portion of  procedure today.  Closed suction drain was brought out the previous left lateral most robotic site into the area of the peritoneal cavity.  The specimen bag was positioned to the left  hemiabdomen and the string of this brought to the left paramedian robotic port site such that it lie completely to the left of midline.  Next, the right ureter, left ureter, terminal ileum tag sutures were grasped with a laparoscopic grasper via the  right 15 mm port site such that these structures lie to the right of the midline.  Robot was then undocked.  Incision was then made approximately 6 cm in length from the camera port inferiorly erring to the left side the umbilicus and the  cystoprostatectomy specimen brought this set aside for permanent pathology.  A wound protector type retractor was placed in the right ureter, left ureter, terminal ileum tag sutures were carefully brought through this and these structures individually  marked with Babcock forceps.  Attention was then directed at harvesting the conduit segment, a 4 cm segment of  distal ileum was taken out of continuity using green load stapler proximal and distal at the previously noted and marked site.  Mesentery was  developed by 1.5 loads white load stapler distal 1 load proximal keeping excellent vascularity to the conduit and anastomotic segments.  Conduit segment was then lined to retroperitoneal orientation, attention was directed at the bowel-bowel anastomosis,  which was performed using 1.5 loads of green load stapler on the antimesenteric border.  The free end was oversewn with running silk with a second imbricating layer of running silk.  Bowel-bowel anastomosis was visibly viable and palpably patent.  The  acute angle anastomosis was bolstered using interrupted silk.  Mesenteric defect was closed using interrupted silk.  I was quite happy with the vascularity and anatomic patency of this and was redelivered into the abdominal cavity.  The proximal  staple  line of the conduit segment was excluded using running Vicryl. Distal staple line removed as were the tagged clips sutures.  Attention was directed at ureteroenteric anastomosis this was performed first on the right side, right ureter by incising  approximately 4 mm segment of proximal conduit, placing 4 mucosal everting sutures, spatulating the distal right ureter.  This was approximately 1.5 cm.  After final margin was obtained in a cold fashion, these reapproximating the heel and toe fashion  over a red color bander stent, which was placed 25 cm anastomosis.  A single interrupted chromic suture was used to inflate the conduit anchoring the stent in place to prevent inadvertent early displacement.  A mirror image ureteroenteric anastomosis was  performed of the left ureter as per the right side, placing a blue color bander stent 25 cm anastomosis.  The in situ proximal aspect of the stent was removed during this. Left ureter, although there was some mild desmoplasia around this.  There was no  obvious gross tumor.  Hopefully, corresponding to just inflammatory response of the stent.  The previously marked 15 mm port site was then dilated to accommodate 2 surgeon's fingers and 1/4 sized column of skin, subcutaneous tissue was dilated to level  of fascia.  Four anchoring sutures of Vicryl were placed at the level of the fascia.  The conduit was brought through this and anchored in a quadrant fashion to prevent parastomal hernia formation.  Next 4 rosebudding sutures were applied in a quadrant  fashion as well.  The abdomen was inspected via the extraction site once again, hemostasis was excellent.  Sponge and needle counts were correct.  There was no obvious omentum to bring over this likely having been using the patient's prior Nissen  fundoplication. Abdominal fascia was closed using figure-of-eight PDS x6 followed by reapproximation of Scarpa's with running Vicryl.  All incision sites were  infiltrated with dilute lipolyzed Marcaine and closed at the level of the skin using  subcuticular Monocryl following Dermabond.  The stoma was matured in a quadrant fashion using rosebudding sutures followed by interrupted Vicryl x2 each quadrant, which revealed an excellent rosebudding the stoma and the stoma mucosa to skin  reapproximation.  Stomal appliance was placed.  Procedure was terminated.  The patient tolerated the procedure well, no immediate perioperative complications.  The patient was taken to postanesthesia care unit in stable condition.  Plan for inpatient admission.  Please note, first assistant, Harrie Foreman, was crucial for all portions surgery today.  She provided invaluable retraction, suctioning, vascular clipping, vascular stapling, lymphatic clipping, robotic instrument exchange and general first assistance.   PUS D: 05/22/2023 5:20:39 pm  T: 05/22/2023 7:49:00 pm  JOB: [id number]/ [id number]

## 2023-05-22 NOTE — Plan of Care (Signed)
  Problem: Education: Goal: Knowledge of General Education information will improve Description: Including pain rating scale, medication(s)/side effects and non-pharmacologic comfort measures Outcome: Progressing   Problem: Clinical Measurements: Goal: Ability to maintain clinical measurements within normal limits will improve Outcome: Progressing Goal: Will remain free from infection Outcome: Progressing Goal: Diagnostic test results will improve Outcome: Progressing Goal: Respiratory complications will improve Outcome: Progressing Goal: Cardiovascular complication will be avoided Outcome: Progressing   Problem: Activity: Goal: Risk for activity intolerance will decrease Outcome: Progressing   Problem: Coping: Goal: Level of anxiety will decrease Outcome: Progressing   Problem: Elimination: Goal: Will not experience complications related to bowel motility Outcome: Progressing Goal: Will not experience complications related to urinary retention Outcome: Progressing

## 2023-05-22 NOTE — Anesthesia Procedure Notes (Addendum)
Procedure Name: Intubation Date/Time: 05/22/2023 12:30 PM  Performed by: Johnette Abraham, CRNAPre-anesthesia Checklist: Patient identified, Emergency Drugs available, Suction available and Patient being monitored Patient Re-evaluated:Patient Re-evaluated prior to induction Oxygen Delivery Method: Circle System Utilized Preoxygenation: Pre-oxygenation with 100% oxygen Induction Type: IV induction Ventilation: Mask ventilation without difficulty Laryngoscope Size: Mac and 4 Grade View: Grade I Tube type: Oral Tube size: 7.5 mm Number of attempts: 1 Airway Equipment and Method: Stylet and Oral airway Placement Confirmation: ETT inserted through vocal cords under direct vision, positive ETCO2 and breath sounds checked- equal and bilateral Secured at: 21 cm Tube secured with: Tape Dental Injury: Teeth and Oropharynx as per pre-operative assessment

## 2023-05-22 NOTE — TOC Initial Note (Signed)
Transition of Care [facility]) - Initial/Assessment Note    Patient Details  Name: Wesley [name] MRN: [id number] Date of Birth: 1951-05-05  Transition of [facility] CM/SW Contact:    Howell Rucks, RN Phone Number: 05/22/2023, 12:02 PM  Clinical Narrative:  Patient for scheduled procedure today. TOC will continue to follow.                       Patient Goals and CMS Choice            Expected Discharge Plan and Services                                              Prior Living Arrangements/Services                       Activities of Daily Living Home Assistive Devices/Equipment: None ADL Screening (condition at time of admission) Patient's cognitive ability adequate to safely complete daily activities?: Yes Is the patient deaf or have difficulty hearing?: No Does the patient have difficulty seeing, even when wearing glasses/contacts?: No Does the patient have difficulty concentrating, remembering, or making decisions?: No Patient able to express need for assistance with ADLs?: Yes Does the patient have difficulty dressing or bathing?: No Independently performs ADLs?: Yes (appropriate for developmental age) Does the patient have difficulty walking or climbing stairs?: No Weakness of Legs: None Weakness of Arms/Hands: None  Permission Sought/Granted                  Emotional Assessment              Admission diagnosis:  BLADDER CANCER Patient Active Problem List   Diagnosis Date Noted   Surgical counseling visit 05/20/2023   Renal stone 03/29/2023   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) 02/22/2023   Hematuria 03/01/2012   Acute retention of urine 03/01/2012   PCP:  [name], MD Pharmacy:   [facility] [phone] - [place], [place] - [address] [place] [address] Phone: [phone] Fax: [phone]     Social Determinants of Health  (SDOH) Social History: SDOH Screenings   Food Insecurity: No Food Insecurity (05/21/2023)  Housing: Low Risk  (05/21/2023)  Transportation Needs: No Transportation Needs (05/21/2023)  Utilities: Not At Risk (05/21/2023)  Alcohol Screen: Low Risk  (02/26/2023)  Depression (PHQ2-9): Low Risk  (02/26/2023)  Financial Resource Strain: Low Risk  (10/09/2021)   Received from [facility] visits prior to 12/08/2022., [facility] visits prior to 12/08/2022.  Physical Activity: Insufficiently Active (10/09/2021)   Received from [facility] visits prior to 12/08/2022., [facility] visits prior to 12/08/2022.  Social Connections: Unknown (02/12/2022)   Received from [facility]  Stress: Stress Concern Present (10/09/2021)   Received from [facility] visits prior to 12/08/2022., [facility] visits prior to 12/08/2022.  Tobacco Use: Medium Risk (05/22/2023)   SDOH Interventions:     Readmission Risk Interventions     No data to display

## 2023-05-22 NOTE — Anesthesia Postprocedure Evaluation (Signed)
Anesthesia Post Note  Patient: Wesley [name]  Procedure(s) Performed: XI ROBOTIC ASSISTED LAPAROSCOPIC COMPLETE CYSTECT ILEAL CONDUIT XI ROBOTIC ASSISTED LAPAROSCOPIC RADICAL PROSTATECTOMY LYMPH NODE DISSECTION (Bilateral) CYSTOSCOPY WITH INJECTION OF INDOCYANINE GREEN DYE     Patient location during evaluation: PACU Anesthesia Type: General Level of consciousness: awake and alert Pain management: pain level controlled Vital Signs Assessment: post-procedure vital signs reviewed and stable Respiratory status: spontaneous breathing, nonlabored ventilation, respiratory function stable and patient connected to nasal cannula oxygen Cardiovascular status: blood pressure returned to baseline and stable Postop Assessment: no apparent nausea or vomiting Anesthetic complications: no   No notable events documented.  Last Vitals:  Vitals:   05/22/23 1800 05/22/23 1815  BP: (!) 155/97 (!) 154/96  Pulse: 90 94  Resp: 13 12  Temp:    SpO2: 93% 95%    Last Pain:  Vitals:   05/22/23 1745  TempSrc:   PainSc: 0-No pain                 Trevor Iha

## 2023-05-22 NOTE — Brief Op Note (Signed)
05/22/2023  4:58 PM  PATIENT:  Wesley [name]  72 y.o. male  PRE-OPERATIVE DIAGNOSIS:  BLADDER CANCER  POST-OPERATIVE DIAGNOSIS:  BLADDER CANCER  PROCEDURE:  Procedure(s): XI ROBOTIC ASSISTED LAPAROSCOPIC COMPLETE CYSTECT ILEAL CONDUIT (N/A) XI ROBOTIC ASSISTED LAPAROSCOPIC RADICAL PROSTATECTOMY (N/A) LYMPH NODE DISSECTION (Bilateral) CYSTOSCOPY WITH INJECTION OF INDOCYANINE GREEN DYE (N/A)  SURGEON:  Surgeons and Role:    * , Delbert Phenix., MD - Primary  PHYSICIAN ASSISTANT:   ASSISTANTS: Harrie Foreman PA   ANESTHESIA:   local and general  EBL:  200 mL   BLOOD ADMINISTERED:none  DRAINS:  1 - RLQ Urostomy to gravity with Rt (red) and Lt (blue) bander stents; 2 - JP to bulb    LOCAL MEDICATIONS USED:  MARCAINE     SPECIMEN:  Source of Specimen:  1 - peritoneal nodule; 2 - pelvic lymph nodes - 3 bladder + partial prostate end bloc  DISPOSITION OF SPECIMEN:  PATHOLOGY  COUNTS:  YES  TOURNIQUET:  * No tourniquets in log *  DICTATION: .Other Dictation: Dictation Number [id number]  PLAN OF CARE: Admit to inpatient   PATIENT DISPOSITION:  PACU - hemodynamically stable.   Delay start of Pharmacological VTE agent (>24hrs) due to surgical blood loss or risk of bleeding: yes

## 2023-05-22 NOTE — Transfer of Care (Signed)
Immediate Anesthesia Transfer of Care Note  Patient: Wesley [name]  Procedure(s) Performed: XI ROBOTIC ASSISTED LAPAROSCOPIC COMPLETE CYSTECT ILEAL CONDUIT XI ROBOTIC ASSISTED LAPAROSCOPIC RADICAL PROSTATECTOMY LYMPH NODE DISSECTION (Bilateral) CYSTOSCOPY WITH INJECTION OF INDOCYANINE GREEN DYE  Patient Location: PACU  Anesthesia Type:General  Level of Consciousness: awake, alert , and oriented  Airway & Oxygen Therapy: Patient Spontanous Breathing and Patient connected to face mask oxygen  Post-op Assessment: Report given to RN  Post vital signs: Reviewed and stable  Last Vitals:  Vitals Value Taken Time  BP 158/79 05/22/23 1705  Temp    Pulse 86 05/22/23 1707  Resp 14 05/22/23 1707  SpO2 100 % 05/22/23 1707  Vitals shown include unfiled device data.  Last Pain:  Vitals:   05/22/23 1003  TempSrc: Oral  PainSc:       Patients Stated Pain Goal: 3 (05/21/23 2000)  Complications: No notable events documented.

## 2023-05-23 ENCOUNTER — Encounter (HOSPITAL_COMMUNITY): Payer: Self-pay | Admitting: Urology

## 2023-05-23 LAB — BASIC METABOLIC PANEL
Anion gap: 8 (ref 5–15)
BUN: 33 mg/dL — ABNORMAL HIGH (ref 8–23)
CO2: 22 mmol/L (ref 22–32)
Calcium: 8.2 mg/dL — ABNORMAL LOW (ref 8.9–10.3)
Chloride: 109 mmol/L (ref 98–111)
Creatinine, Ser: 2.53 mg/dL — ABNORMAL HIGH (ref 0.61–1.24)
GFR, Estimated: 26 mL/min — ABNORMAL LOW (ref >60)
Glucose, Bld: 106 mg/dL — ABNORMAL HIGH (ref 70–99)
Potassium: 4.4 mmol/L (ref 3.5–5.1)
Sodium: 139 mmol/L (ref 135–145)

## 2023-05-23 LAB — HEMOGLOBIN AND HEMATOCRIT, BLOOD
HCT: 31.3 % — ABNORMAL LOW (ref 39.0–52.0)
Hemoglobin: 9.9 g/dL — ABNORMAL LOW (ref 13.0–17.0)

## 2023-05-23 LAB — GLUCOSE, CAPILLARY: Glucose-Capillary: 88 mg/dL (ref 70–99)

## 2023-05-23 MED ORDER — SODIUM CHLORIDE 0.9 % IV SOLN: INTRAVENOUS | Status: DC

## 2023-05-23 NOTE — TOC Progression Note (Addendum)
Transition of Care [facility]) - Progression Note    Patient Details  Name: Wesley [name] MRN: [id number] Date of Birth: 25-Jul-1951  Transition of [facility]) CM/SW Contact  Howell Rucks, RN Phone Number: 05/23/2023, 10:16 AM  Clinical Narrative:   Met with pt at bedside to introduce role of TOC/NCM and review or dc planning. Pt reports he has a PCP and pharmacy in place, no home care services or home DME, pt reports feels safe returning home with support from his spouse, family to provide transportation at discharge. PT eval pending, await recommendation. TOC will continue to follow.    -3:33pm Met with pt at bedside regarding PT recommendation for [facility] PT, pt reports he would like a day or two to think about, states he is 1 day post op, he was independent prior to admission, would like to wait to see how he progresses. TOC will continue to follow.          Expected Discharge Plan and Services                                               Social Determinants of Health (SDOH) Interventions SDOH Screenings   Food Insecurity: No Food Insecurity (05/21/2023)  Housing: Low Risk  (05/21/2023)  Transportation Needs: No Transportation Needs (05/21/2023)  Utilities: Not At Risk (05/21/2023)  Alcohol Screen: Low Risk  (02/26/2023)  Depression (PHQ2-9): Low Risk  (02/26/2023)  Financial Resource Strain: Low Risk  (10/09/2021)   [facility] from [facility] visits prior to 12/08/2022., [facility] visits prior to 12/08/2022.  Physical Activity: Insufficiently Active (10/09/2021)   Received from [facility] visits prior to 12/08/2022., [facility] visits prior to 12/08/2022.  Social Connections: Unknown (02/12/2022)   Received from [facility]  Stress: Stress Concern Present (10/09/2021)   Received from [facility] visits prior to 12/08/2022., [facility]  visits prior to 12/08/2022.  Tobacco Use: Medium Risk (05/22/2023)    Readmission Risk Interventions     No data to display

## 2023-05-23 NOTE — Consult Note (Addendum)
WOC Nurse ostomy consult note Stoma type/location: RLQ, ileal conduit Stomal assessment/size: 1 3/8" round, prolapsed, red and blue stent in place Peristomal assessment: intact, dip in the abdominal topography at 9 o'clock  Treatment options for stomal/peristomal skin: 2" skin barrier ring Output dark yellow urine Ostomy pouching: 1pc.flat with 2" skin barrier ring Education provided:  Explained role of ostomy nurse and creation of stoma  Explained stoma characteristics (budded, flush, color, texture, care); understanding prolapse of current stoma vs. Image seen online and in educational materials  Demonstrated pouch change (cutting new barrier, measuring stoma, cleaning peristomal skin and stoma, use of barrier ring) Education on use wick in stoma to keep skin dry with pouch change Education on emptying when 1/3 to 1/2 full and how to empty Education on urine characteristics (sediment, mucous) Demonstrated hooking pouch to nighttime drainage bag Discussed bathing; water activities  Discussed risk of peristomal hernia Provided patient with [employer] and marked items currently using Patient has been using [employer] for his urinary supplies prior to surgery, explained he can certainly continue to use them. Just providing him with items he needs in the catalog provided  Discussed CTF vs pre-cut pouches and need to wait for 3 months (use 3 mons supply that DME company sends and then request switch to precut based on stoma size) Answered patient/family questions:    Enrolled patient in [employer] DC program: Yes Would recommend [facility] for continued support at home for ostomy teaching. Please make referral to outpatient clinic for follow up.  Plans to meet with wife and patient again 8/16 at 11am for continued teaching  WOC Nurse will follow along with you for continued support with ostomy teaching and care Saifan Rayford [facility] MSN, RN, [place], CNS, [place] [phone]

## 2023-05-23 NOTE — Plan of Care (Signed)

## 2023-05-23 NOTE — Evaluation (Signed)
Physical Therapy Evaluation Patient Details Name: Wesley [name] MRN: [id number] DOB: 20-May-1951 Today's Date: 05/23/2023  History of Present Illness  Pt is a 72 year old male s/p robotic cystoprostatectomy with node dissection and conduit diversion 05/22/23 due to bladder cancer  Clinical Impression  Pt admitted with above diagnosis.  Pt currently with functional limitations due to the deficits listed below (see PT Problem List). Pt will benefit from acute skilled PT to increase their independence and safety with mobility to allow discharge.  Pt reports mostly being in bed the past couple days.  Pt assisted with ambulating in hallway however only tolerated short distance due to fatigue and mild dizziness.  Pt encouraged to remain in recliner end of session as well as ambulate with staff at least 3-4x a day.  Anticipate pt to progress well to d/c home with HHPT.         If plan is discharge home, recommend the following: A little help with walking and/or transfers;A little help with bathing/dressing/bathroom;Help with stairs or ramp for entrance   Can travel by private vehicle        Equipment Recommendations None recommended by PT  Recommendations for Other Services       Functional Status Assessment Patient has had a recent decline in their functional status and demonstrates the ability to make significant improvements in function in a reasonable and predictable amount of time.     Precautions / Restrictions Precautions Precautions: Fall Precaution Comments: Left JP drain, ileal conduit      Mobility  Bed Mobility Overal bed mobility: Needs Assistance Bed Mobility: Rolling, Sidelying to Sit, Sit to Sidelying Rolling: Contact guard assist, Used rails Sidelying to sit: Contact guard assist, Used rails     Sit to sidelying: Contact guard assist, Used rails General bed mobility comments: cues for log roll technique for pain control    Transfers Overall transfer level:  Needs assistance Equipment used: None Transfers: Sit to/from Stand Sit to Stand: Contact guard assist           General transfer comment: contact guard for safety, cues for hand placement    Ambulation/Gait Ambulation/Gait assistance: Contact guard assist Gait Distance (Feet): 80 Feet Assistive device: IV Pole Gait Pattern/deviations: Step-through pattern, Decreased stride length, Trunk flexed, Narrow base of support Gait velocity: decr     General Gait Details: short steps, utlized pushing IV pole for a little more support but declined needing RW, distance per tolerance, reports mild dizziness which did not worsen  Stairs            Wheelchair Mobility     Tilt Bed    Modified Rankin (Stroke Patients Only)       Balance Overall balance assessment: Mild deficits observed, not formally tested                                           Pertinent Vitals/Pain Pain Assessment Pain Assessment: Faces Faces Pain Scale: Hurts little more Pain Location: abdomen - surgical sites Pain Descriptors / Indicators: Sore Pain Intervention(s): Repositioned, Monitored during session, Premedicated before session    Home Living Family/patient expects to be discharged to:: Private residence Living Arrangements: Spouse/significant other   Type of Home: House Home Access: Stairs to enter   Secretary/administrator of Steps: 3   Home Layout: Able to live on main level with bedroom/bathroom Home Equipment: None  Prior Function Prior Level of Function : Independent/Modified Independent                     Extremity/Trunk Assessment        Lower Extremity Assessment Lower Extremity Assessment: Generalized weakness       Communication   Communication Communication: No apparent difficulties  Cognition Arousal: Alert Behavior During Therapy: WFL for tasks assessed/performed Overall Cognitive Status: Within Functional Limits for tasks  assessed                                          General Comments      Exercises     Assessment/Plan    PT Assessment Patient needs continued PT services  PT Problem List Decreased mobility;Decreased activity tolerance;Decreased strength;Decreased knowledge of use of DME       PT Treatment Interventions DME instruction;Gait training;Stair training;Functional mobility training;Therapeutic activities;Therapeutic exercise;Balance training;Patient/family education    PT Goals (Current goals can be found in the Care Plan section)  Acute Rehab PT Goals Patient Stated Goal: get back to playing guitar PT Goal Formulation: With patient Time For Goal Achievement: 06/06/23 Potential to Achieve Goals: Good    Frequency Min 1X/week     Co-evaluation               AM-PAC PT "6 Clicks" Mobility  Outcome Measure Help needed turning from your back to your side while in a flat bed without using bedrails?: A Little Help needed moving from lying on your back to sitting on the side of a flat bed without using bedrails?: A Little Help needed moving to and from a bed to a chair (including a wheelchair)?: A Little Help needed standing up from a chair using your arms (e.g., wheelchair or bedside chair)?: A Little Help needed to walk in hospital room?: A Little Help needed climbing 3-5 steps with a railing? : A Lot 6 Click Score: 11    End of Session Equipment Utilized During Treatment: Gait belt Activity Tolerance: Patient tolerated treatment well Patient left: in chair;with call bell/phone within reach;with family/visitor present Nurse Communication: Mobility status PT Visit Diagnosis: Difficulty in walking, not elsewhere classified (R26.2)    Time: 4401-0272 PT Time Calculation (min) (ACUTE ONLY): 14 min   Charges:   PT Evaluation $PT Eval Low Complexity: 1 Low   PT General Charges $$ ACUTE PT VISIT: 1 Visit        Paulino Door, DPT Physical  Therapist Acute Rehabilitation Services Office: [phone]   Janan Halter [place] 05/23/2023, 2:13 PM

## 2023-05-23 NOTE — Progress Notes (Signed)
Mobility Specialist - Progress Note   05/23/23 1428  Mobility  Activity Ambulated with assistance in hallway  Level of Assistance Standby assist, set-up cues, supervision of patient - no hands on  Assistive Device Other (Comment) (iv pole)  Distance Ambulated (ft) 95 ft  Range of Motion/Exercises Active  Activity Response Tolerated well  Mobility Referral Yes  $Mobility charge 1 Mobility  Mobility Specialist Start Time (ACUTE ONLY) 1345  Mobility Specialist Stop Time (ACUTE ONLY) 1355  Mobility Specialist Time Calculation (min) (ACUTE ONLY) 10 min   Pt received in bed and agreed to mobility, had no issues throughout session. Pt was Min A for STS, standby for rest of session and returned to bed with all needs met and family in room.  Marilynne Halsted Mobility Specialist

## 2023-05-23 NOTE — Progress Notes (Signed)
   1 Day Post-Op Subjective: No acute events overnight.  Postop day 1 from radical cystectomy with ileal conduit.    Appropriate urine output.  Drain output 320 cc.  Not passing gas yet.  Hemoglobin 9.9 today, down from 10.4.    Objective: Vital signs in last 24 hours: Temp:  [97.8 F (36.6 C)-99 F (37.2 C)] 99 F (37.2 C) (08/15 0842) Pulse Rate:  [83-99] 93 (08/15 0842) Resp:  [0-26] 13 (08/15 0842) BP: (115-174)/(67-97) 155/80 (08/15 0842) SpO2:  [92 %-100 %] 96 % (08/15 0845) Weight:  [52.5 kg] 52.5 kg (08/14 1101)  Assessment/Plan: 72 year old male with aggressive bladder cancer now status post radical cystectomy and ileal conduit.  Case was difficult unable to remove all of prostate evidence of advanced disease.  -Continue pain medications -Continue IV fluids -N.p.o. with ice chips today.  Okay for sips with meds. -Continue IV Tylenol. -PT eval -Continue wound ostomy care for urostomy teaching -OOB and walking   Intake/Output from previous day: 08/14 0701 - 08/15 0700 In: 2577.3 [P.O.:30; I.V.:2214.6; IV Piggyback:332.7] Out: 2195 [Urine:1675; Drains:320; Blood:200]  Intake/Output this shift: No intake/output data recorded.  Physical Exam:  General: Alert and oriented CV: No cyanosis Lungs: equal chest rise Abdomen: Abdomen soft and appropriately tender. Skin: Gu: Urostomy pink and nonprolapse.  Clear yellow urine in the bag.  Drain in place with serosanguineous output.  Lab Results: Recent Labs    05/21/23 1516 05/22/23 2029 05/23/23 0443  HGB 10.9* 10.4* 9.9*  HCT 33.0* 31.5* 31.3*   BMET Recent Labs    05/21/23 1516 05/23/23 0443  NA 136 139  K 3.8 4.4  CL 106 109  CO2 21* 22  GLUCOSE 135* 106*  BUN 42* 33*  CREATININE 2.69* 2.53*  CALCIUM 8.8* 8.2*     Studies/Results: No results found.    LOS: 2 days   Jerald Kief, MD, PhD [facility] Resident  [facility] Urology    05/23/2023, 9:13 AM

## 2023-05-24 LAB — BASIC METABOLIC PANEL
Anion gap: 11 (ref 5–15)
BUN: 30 mg/dL — ABNORMAL HIGH (ref 8–23)
CO2: 20 mmol/L — ABNORMAL LOW (ref 22–32)
Calcium: 8.4 mg/dL — ABNORMAL LOW (ref 8.9–10.3)
Chloride: 107 mmol/L (ref 98–111)
Creatinine, Ser: 2.02 mg/dL — ABNORMAL HIGH (ref 0.61–1.24)
GFR, Estimated: 34 mL/min — ABNORMAL LOW (ref >60)
Glucose, Bld: 96 mg/dL (ref 70–99)
Potassium: 4 mmol/L (ref 3.5–5.1)
Sodium: 138 mmol/L (ref 135–145)

## 2023-05-24 LAB — HEMOGLOBIN AND HEMATOCRIT, BLOOD
HCT: 34.1 % — ABNORMAL LOW (ref 39.0–52.0)
Hemoglobin: 10.9 g/dL — ABNORMAL LOW (ref 13.0–17.0)

## 2023-05-24 MED ORDER — ACETAMINOPHEN 10 MG/ML IV SOLN
1000.0000 mg | Freq: Four times a day (QID) | INTRAVENOUS | Status: AC
  Administered 2023-05-24 – 2023-05-25 (×4): 1000 mg via INTRAVENOUS
  Filled 2023-05-24 (×4): qty 100

## 2023-05-24 NOTE — Consult Note (Signed)
WOC Nurse ostomy follow up Stoma type/location: RLQ, ileal conduit No pouch change today  Treatment options for stomal/peristomal skin: 2" skin barrier ring Output yellow urine Ostomy pouching: 1pc.flat with 2" ostomy barrier ring Education provided:  Met with wife and patient again today.  Demonstrated using pattern from pouch yesterday to cut new skin barrier, allowed wife to cut new pouch/skin barrier. Patient is able to open and close spout Patient is able to connect and disconnect night time drainage bag Patient is able to verbalize pouch change schedule (2-3x wk). Reviewed when to empty pouch if not connected to BSD; requested nursing to disconnect and allow patient to ambulate, try to empty into the toliet. Patient and wife able to verbalize steps in pouch change  Discussed activities such as swimming, traveling.  Reminded OK to shower with pouch on.  Patient has supplier [employer] they will use for ostomy supplies.  No other questions from patient or his wife today.  Enrolled patient in [place] Secure Start Discharge program: Yes  Will plan for another pouch change Monday 8/19 if patient still inpatient.   WOC Nurse will follow along with you for continued support with ostomy teaching and care Amarien Carne [facility], RN, [place], CNS, [place] [phone]

## 2023-05-24 NOTE — Care Management Important Message (Signed)
Important Message  Patient Details IM Letter given Name: SUKHRAJ OKE MRN: [id number] Date of Birth: Feb 08, 1951   Medicare Important Message Given:  Yes     Caren Macadam 05/24/2023, 10:46 AM

## 2023-05-24 NOTE — Progress Notes (Signed)
Unable to get pt oob at this time to attempt New order. Pt stated he will try later tonight or tomorrow.

## 2023-05-24 NOTE — Progress Notes (Addendum)
   2 Days Post-Op Subjective: Patient did well yesterday.  He worked with PT yesterday.  He worked with wound ostomy yesterday.   Pain well-controlled but he did have some spasms in his abdomen yesterday.  It improved with medication.  Stable today.  Tolerated ice chips yesterday without nausea or vomiting.  UOP appropriate.    Objective: Vital signs in last 24 hours: Temp:  [97.6 F (36.4 C)-100.2 F (37.9 C)] 97.6 F (36.4 C) (08/16 1206) Pulse Rate:  [95-106] 102 (08/16 1206) Resp:  [16-20] 16 (08/16 1206) BP: (152-169)/(80-96) 159/89 (08/16 1206) SpO2:  [93 %-95 %] 93 % (08/16 1206)  Assessment/Plan: 72 year old male with aggressive bladder cancer now status post radical cystectomy and ileal conduit.  Case was difficult unable to remove all of prostate evidence of advanced disease.  -Continue pain medications -IV fluids dropped to 50 mL/h -Clear liquids today -Continue IV Tylenol. -Continue to work with PT -Continue wound ostomy care for urostomy teaching -OOB and walking   Intake/Output from previous day: 08/15 0701 - 08/16 0700 In: 1076 [P.O.:60; I.V.:816; IV Piggyback:200] Out: 2370 [Urine:2100; Drains:270]  Intake/Output this shift: Total I/O In: -  Out: 80 [Drains:80]  Physical Exam:  General: Alert and oriented CV: No cyanosis Lungs: equal chest rise Abdomen: Abdomen soft and appropriately tender. Skin: Gu: Urostomy pink and nonprolapse.  Pink and blue stent in place. clear yellow urine in the bag.  Drain in place with serosanguineous output.  Lab Results: Recent Labs    05/22/23 2029 05/23/23 0443 05/24/23 0425  HGB 10.4* 9.9* 10.9*  HCT 31.5* 31.3* 34.1*   BMET Recent Labs    05/23/23 0443 05/24/23 0425  NA 139 138  K 4.4 4.0  CL 109 107  CO2 22 20*  GLUCOSE 106* 96  BUN 33* 30*  CREATININE 2.53* 2.02*  CALCIUM 8.2* 8.4*     Studies/Results: No results found.    LOS: 3 days   Jerald Kief, MD, PhD [facility] Resident   [facility] Urology    05/24/2023, 1:12 PM

## 2023-05-24 NOTE — Plan of Care (Signed)
Cont you with plan of care

## 2023-05-24 NOTE — Progress Notes (Signed)
Mobility Specialist - Progress Note   05/24/23 1051  Mobility  Activity Ambulated with assistance in hallway  Level of Assistance Standby assist, set-up cues, supervision of patient - no hands on  Assistive Device None  Distance Ambulated (ft) 460 ft  Range of Motion/Exercises Active  Activity Response Tolerated well  Mobility Referral Yes  $Mobility charge 1 Mobility  Mobility Specialist Start Time (ACUTE ONLY) 1035  Mobility Specialist Stop Time (ACUTE ONLY) 1050  Mobility Specialist Time Calculation (min) (ACUTE ONLY) 15 min   Pt received in bed and agreed to mobility. Had no issues throughout session, returned to bed with all needs met.  Marilynne Halsted Mobility Specialist

## 2023-05-25 LAB — BASIC METABOLIC PANEL
Anion gap: 9 (ref 5–15)
BUN: 28 mg/dL — ABNORMAL HIGH (ref 8–23)
CO2: 24 mmol/L (ref 22–32)
Calcium: 8.5 mg/dL — ABNORMAL LOW (ref 8.9–10.3)
Chloride: 106 mmol/L (ref 98–111)
Creatinine, Ser: 2.05 mg/dL — ABNORMAL HIGH (ref 0.61–1.24)
GFR, Estimated: 34 mL/min — ABNORMAL LOW (ref >60)
Glucose, Bld: 144 mg/dL — ABNORMAL HIGH (ref 70–99)
Potassium: 4 mmol/L (ref 3.5–5.1)
Sodium: 139 mmol/L (ref 135–145)

## 2023-05-25 LAB — HEMOGLOBIN AND HEMATOCRIT, BLOOD
HCT: 32.2 % — ABNORMAL LOW (ref 39.0–52.0)
Hemoglobin: 10.5 g/dL — ABNORMAL LOW (ref 13.0–17.0)

## 2023-05-25 NOTE — Progress Notes (Signed)
3 Days Post-Op  Subjective: Darryl is doing well 3 days postop but he is having some gas pain and he is concerned about advancing the diet.  He still has moderate JP drainage.  ROS:  Review of Systems  Constitutional:  Negative for chills and fever.  Gastrointestinal:  Positive for abdominal pain (gas pains). Negative for nausea.    Anti-infectives: Anti-infectives (From admission, onward)    Start     Dose/Rate Route Frequency Ordered Stop   05/22/23 2200  piperacillin-tazobactam (ZOSYN) IVPB 3.375 g        3.375 g 12.5 mL/hr over 240 Minutes Intravenous Every 12 hours 05/22/23 1912 05/23/23 1400   05/22/23 0600  piperacillin-tazobactam (ZOSYN) IVPB 3.375 g       Note to Pharmacy: 2.25 g per Dr. Berneice Heinrich.   3.375 g 100 mL/hr over 30 Minutes Intravenous 30 min pre-op 05/21/23 1457 05/22/23 1233   05/21/23 1630  neomycin (MYCIFRADIN) tablet 500 mg        500 mg Oral Every 4 hours 05/21/23 1534 05/22/23 0040   05/21/23 1630  metroNIDAZOLE (FLAGYL) tablet 500 mg        500 mg Oral Every 4 hours 05/21/23 1534 05/22/23 0039       Current Facility-Administered Medications  Medication Dose Route Frequency Provider Last Rate Last Admin   0.9 %  sodium chloride infusion   Intravenous Continuous Matthew-Onabanjo, [place], MD 50 mL/hr at 05/25/23 0021 New Bag at 05/25/23 0021   alvimopan (ENTEREG) capsule 12 mg  12 mg Oral BID Harrie Foreman, PA-C   12 mg at 05/24/23 2143   amLODipine (NORVASC) tablet 5 mg  5 mg Oral QHS Harrie Foreman, PA-C   5 mg at 05/24/23 2144   diphenhydrAMINE (BENADRYL) injection 12.5 mg  12.5 mg Intravenous Q6H PRN Harrie Foreman, PA-C       Or   diphenhydrAMINE (BENADRYL) 12.5 MG/5ML elixir 12.5 mg  12.5 mg Oral Q6H PRN Dancy, Amanda, PA-C       doxepin (SINEQUAN) capsule 25 mg  25 mg Oral QHS Dancy, Marchelle Folks, PA-C   25 mg at 05/24/23 2145   fluticasone furoate-vilanterol (BREO ELLIPTA) 100-25 MCG/ACT 1 puff  1 puff Inhalation Daily Harrie Foreman, PA-C   1 puff at 05/25/23  0814   HYDROmorphone (DILAUDID) injection 0.5-1 mg  0.5-1 mg Intravenous Q2H PRN Harrie Foreman, PA-C   1 mg at 05/25/23 0014   levETIRAcetam (KEPPRA) tablet 500 mg  500 mg Oral TID Harrie Foreman, PA-C   500 mg at 05/24/23 2145   ondansetron (ZOFRAN) injection 4 mg  4 mg Intravenous Q4H PRN Harrie Foreman, PA-C       Oral care mouth rinse  15 mL Mouth Rinse PRN Harrie Foreman, PA-C       OXcarbazepine ER TB24 600 mg  600 mg Oral Q1500 Dancy, Amanda, PA-C   600 mg at 05/24/23 1424   oxyCODONE (Oxy IR/ROXICODONE) immediate release tablet 5 mg  5 mg Oral Q4H PRN Harrie Foreman, PA-C   5 mg at 05/25/23 0610   primidone (MYSOLINE) tablet 100 mg  100 mg Oral QHS Harrie Foreman, PA-C   100 mg at 05/24/23 2145   senna-docusate (Senokot-S) tablet 2 tablet  2 tablet Oral QHS Harrie Foreman, PA-C   2 tablet at 05/24/23 2144   Facility-Administered Medications Ordered in Other Encounters  Medication Dose Route Frequency Provider Last Rate Last Admin   magnesium citrate solution 1 Bottle  1 Bottle Oral Once Loletta Parish., MD  magnesium citrate solution 1 Bottle  1 Bottle Oral Once Loletta Parish., MD         Objective: Vital signs in last 24 hours: Temp:  [97.6 F (36.4 C)-99.5 F (37.5 C)] 98.6 F (37 C) (08/17 0444) Pulse Rate:  [84-102] 84 (08/17 0444) Resp:  [16-18] 16 (08/17 0444) BP: (151-167)/(77-89) 151/83 (08/17 0444) SpO2:  [90 %-95 %] 90 % (08/17 0814)  Intake/Output from previous day: 08/16 0701 - 08/17 0700 In: 2106.9 [P.O.:180; I.V.:1503.8; IV Piggyback:423.1] Out: 1510 [Urine:1250; Drains:260] Intake/Output this shift: No intake/output data recorded.   Physical Exam Vitals reviewed.  Constitutional:      Appearance: Normal appearance.  Cardiovascular:     Rate and Rhythm: Normal rate and regular rhythm.     Heart sounds: Normal heart sounds.  Pulmonary:     Effort: Pulmonary effort is normal. No respiratory distress.     Breath sounds: Normal breath sounds.   Abdominal:     General: Bowel sounds are normal. There is distension (mild).     Palpations: Abdomen is soft.     Comments: Incisions intact.   Stoma pink and productive with stents in place. JP with serous drainage.   Musculoskeletal:        General: No swelling or tenderness. Normal range of motion.  Skin:    General: Skin is warm and dry.  Neurological:     General: No focal deficit present.     Mental Status: He is alert and oriented to person, place, and time.     Lab Results:  Recent Labs    05/24/23 0425 05/25/23 0515  HGB 10.9* 10.5*  HCT 34.1* 32.2*   BMET Recent Labs    05/24/23 0425 05/25/23 0515  NA 138 139  K 4.0 4.0  CL 107 106  CO2 20* 24  GLUCOSE 96 144*  BUN 30* 28*  CREATININE 2.02* 2.05*  CALCIUM 8.4* 8.5*   PT/INR No results for input(s): "LABPROT", "INR" in the last 72 hours. ABG No results for input(s): "PHART", "HCO3" in the last 72 hours.  Invalid input(s): "PCO2", "PO2"  Studies/Results: No results found.   Assessment and Plan: Bladder cancer s/p cystectomy and conduit.  He is doing well but has some gas pain and is reluctant to push his diet.  He will try clears again today.  CKD.  His Cr is stable.    Postop anemia.   Hgb is minimal decreased to 10.5.       LOS: 4 days    [name] 8/17/2024Patient ID: Rolanda Jay, male   DOB: [birth year], 72 y.o.   MRN: [id number]

## 2023-05-25 NOTE — TOC Progression Note (Signed)
Transition of Care [facility]) - Progression Note    Patient Details  Name: Wesley [name] MRN: [id number] Date of Birth: 01-02-1951  Transition of [facility] CM/SW Contact  Tonji Elliff, Olegario Messier, RN Phone Number: 05/25/2023, 2:16 PM  Clinical Narrative: Beatris Ship accepted for HHPT, will follow if [facility] needed for ileal conduit teaching-patient states he & wife are doing good with teaching, & may not need HHRN, also WOC has not recc HHRN.Continue to monitor for d/c needs.      Expected Discharge Plan: Home w Home Health Services Barriers to Discharge: Continued Medical Work up  Expected Discharge Plan and Services   Discharge Planning Services: CM Consult Post Acute Care Choice: Home Health Living arrangements for the past 2 months: Single Family Home                           HH Arranged: PT HH Agency: [facility] Date [facility] Agency Contacted: 05/25/23 Time HH Agency Contacted: 1416 Representative spoke with at [facility] Agency: Kandee Keen   Social Determinants of Health (SDOH) Interventions SDOH Screenings   Food Insecurity: No Food Insecurity (05/21/2023)  Housing: Low Risk  (05/21/2023)  Transportation Needs: No Transportation Needs (05/21/2023)  Utilities: Not At Risk (05/21/2023)  Alcohol Screen: Low Risk  (02/26/2023)  Depression (PHQ2-9): Low Risk  (02/26/2023)  Financial Resource Strain: Low Risk  (10/09/2021)   Received from [facility] visits prior to 12/08/2022., [facility] visits prior to 12/08/2022.  Physical Activity: Insufficiently Active (10/09/2021)   Received from [facility] visits prior to 12/08/2022., [facility] visits prior to 12/08/2022.  Social Connections: Unknown (02/12/2022)   Received from [facility]  Stress: Stress Concern Present (10/09/2021)   Received from [facility] visits prior to 12/08/2022., [facility] visits  prior to 12/08/2022.  Tobacco Use: Medium Risk (05/22/2023)    Readmission Risk Interventions    05/23/2023   10:18 AM  Readmission Risk Prevention Plan  Post Dischage Appt Complete  Medication Screening Complete  Transportation Screening Complete

## 2023-05-25 NOTE — Plan of Care (Signed)
Patient alert refused to ambulate last night and patient continues to complain of pain on the right incision site of the abdomen, given PRN pain meds with minimal relief. Call light given and within reach and Comfort measures provided.  Problem: Activity: Goal: Risk for activity intolerance will decrease 05/25/2023 0648 by Laurey Arrow, RN Outcome: Progressing  Problem: Pain Managment: Goal: General experience of comfort will improve 05/25/2023 0648 by Laurey Arrow, RN Outcome: Progressing

## 2023-05-25 NOTE — Plan of Care (Signed)
Cont with plan of care

## 2023-05-25 NOTE — Progress Notes (Signed)
Received report from off going RN.  Agree with previous RN assessment.

## 2023-05-26 LAB — BASIC METABOLIC PANEL
Anion gap: 9 (ref 5–15)
BUN: 25 mg/dL — ABNORMAL HIGH (ref 8–23)
CO2: 20 mmol/L — ABNORMAL LOW (ref 22–32)
Calcium: 8.2 mg/dL — ABNORMAL LOW (ref 8.9–10.3)
Chloride: 108 mmol/L (ref 98–111)
Creatinine, Ser: 1.74 mg/dL — ABNORMAL HIGH (ref 0.61–1.24)
GFR, Estimated: 41 mL/min — ABNORMAL LOW (ref >60)
Glucose, Bld: 136 mg/dL — ABNORMAL HIGH (ref 70–99)
Potassium: 3.5 mmol/L (ref 3.5–5.1)
Sodium: 137 mmol/L (ref 135–145)

## 2023-05-26 LAB — HEMOGLOBIN AND HEMATOCRIT, BLOOD
HCT: 32.7 % — ABNORMAL LOW (ref 39.0–52.0)
Hemoglobin: 10.5 g/dL — ABNORMAL LOW (ref 13.0–17.0)

## 2023-05-26 NOTE — Progress Notes (Signed)
4 Days Post-Op  Subjective: Wesley [name] is doing well 4 days postop.  He had some gas pain last night but that has resolved.   He is passing flatus and tolerating clear liquids.   ROS:  Review of Systems  Constitutional:  Negative for chills and fever.  Gastrointestinal:  Negative for abdominal pain (gas pains) and nausea.    Anti-infectives: Anti-infectives (From admission, onward)    Start     Dose/Rate Route Frequency Ordered Stop   05/22/23 2200  piperacillin-tazobactam (ZOSYN) IVPB 3.375 g        3.375 g 12.5 mL/hr over 240 Minutes Intravenous Every 12 hours 05/22/23 1912 05/23/23 1400   05/22/23 0600  piperacillin-tazobactam (ZOSYN) IVPB 3.375 g       Note to Pharmacy: 2.25 g per Dr. Berneice Heinrich.   3.375 g 100 mL/hr over 30 Minutes Intravenous 30 min pre-op 05/21/23 1457 05/22/23 1233   05/21/23 1630  neomycin (MYCIFRADIN) tablet 500 mg        500 mg Oral Every 4 hours 05/21/23 1534 05/22/23 0040   05/21/23 1630  metroNIDAZOLE (FLAGYL) tablet 500 mg        500 mg Oral Every 4 hours 05/21/23 1534 05/22/23 0039       Current Facility-Administered Medications  Medication Dose Route Frequency Provider Last Rate Last Admin   0.9 %  sodium chloride infusion   Intravenous Continuous Matthew-Onabanjo, [place], MD 50 mL/hr at 05/26/23 0600 Infusion Verify at 05/26/23 0600   alvimopan (ENTEREG) capsule 12 mg  12 mg Oral BID Harrie Foreman, PA-C   12 mg at 05/25/23 2108   amLODipine (NORVASC) tablet 5 mg  5 mg Oral QHS Harrie Foreman, PA-C   5 mg at 05/25/23 2107   diphenhydrAMINE (BENADRYL) injection 12.5 mg  12.5 mg Intravenous Q6H PRN Harrie Foreman, PA-C       Or   diphenhydrAMINE (BENADRYL) 12.5 MG/5ML elixir 12.5 mg  12.5 mg Oral Q6H PRN Dancy, Amanda, PA-C       doxepin (SINEQUAN) capsule 25 mg  25 mg Oral QHS Dancy, Marchelle Folks, PA-C   25 mg at 05/25/23 2108   fluticasone furoate-vilanterol (BREO ELLIPTA) 100-25 MCG/ACT 1 puff  1 puff Inhalation Daily Dancy, Marchelle Folks, PA-C   1 puff at 05/26/23 0746    HYDROmorphone (DILAUDID) injection 0.5-1 mg  0.5-1 mg Intravenous Q2H PRN Harrie Foreman, PA-C   1 mg at 05/26/23 0258   levETIRAcetam (KEPPRA) tablet 500 mg  500 mg Oral TID Harrie Foreman, PA-C   500 mg at 05/25/23 2107   ondansetron (ZOFRAN) injection 4 mg  4 mg Intravenous Q4H PRN Harrie Foreman, PA-C       Oral care mouth rinse  15 mL Mouth Rinse PRN Harrie Foreman, PA-C       OXcarbazepine ER TB24 600 mg  600 mg Oral Q1500 Dancy, Amanda, PA-C   600 mg at 05/25/23 1514   oxyCODONE (Oxy IR/ROXICODONE) immediate release tablet 5 mg  5 mg Oral Q4H PRN Harrie Foreman, PA-C   5 mg at 05/25/23 2107   primidone (MYSOLINE) tablet 100 mg  100 mg Oral QHS Harrie Foreman, PA-C   100 mg at 05/25/23 2108   senna-docusate (Senokot-S) tablet 2 tablet  2 tablet Oral QHS Harrie Foreman, PA-C   2 tablet at 05/25/23 2107   Facility-Administered Medications Ordered in Other Encounters  Medication Dose Route Frequency Provider Last Rate Last Admin   magnesium citrate solution 1 Bottle  1 Bottle Oral Once Loletta Parish., MD  magnesium citrate solution 1 Bottle  1 Bottle Oral Once Loletta Parish., MD         Objective: Vital signs in last 24 hours: Temp:  [97.8 F (36.6 C)-100.5 F (38.1 C)] 97.8 F (36.6 C) (08/18 0413) Pulse Rate:  [94-103] 99 (08/18 0413) Resp:  [15-18] 15 (08/18 0413) BP: (134-162)/(82-97) 134/82 (08/18 0413) SpO2:  [92 %-94 %] 93 % (08/18 0747)  Intake/Output from previous day: 08/17 0701 - 08/18 0700 In: 1783.9 [P.O.:600; I.V.:1183.9] Out: 1862 [Urine:1425; Drains:437] Intake/Output this shift: No intake/output data recorded.   Physical Exam Vitals reviewed.  Constitutional:      Appearance: Normal appearance.  Cardiovascular:     Rate and Rhythm: Normal rate and regular rhythm.     Heart sounds: Normal heart sounds.  Pulmonary:     Effort: Pulmonary effort is normal. No respiratory distress.     Breath sounds: Normal breath sounds.  Abdominal:      General: Bowel sounds are normal. There is no distension (mild).     Palpations: Abdomen is soft.     Comments: Incisions intact.   Stoma pink and productive with stents in place. JP with serous drainage.   Musculoskeletal:        General: No swelling or tenderness. Normal range of motion.  Skin:    General: Skin is warm and dry.  Neurological:     General: No focal deficit present.     Mental Status: He is alert and oriented to person, place, and time.     Lab Results:  Recent Labs    05/25/23 0515 05/26/23 0520  HGB 10.5* 10.5*  HCT 32.2* 32.7*   BMET Recent Labs    05/25/23 0515 05/26/23 0520  NA 139 137  K 4.0 3.5  CL 106 108  CO2 24 20*  GLUCOSE 144* 136*  BUN 28* 25*  CREATININE 2.05* 1.74*  CALCIUM 8.5* 8.2*   PT/INR No results for input(s): "LABPROT", "INR" in the last 72 hours. ABG No results for input(s): "PHART", "HCO3" in the last 72 hours.  Invalid input(s): "PCO2", "PO2"  Studies/Results: No results found.   Assessment and Plan: Bladder cancer s/p cystectomy and conduit.  He is doing well and is passing flatus.  He is tolerating clears.  CKD.  His Cr is down.    Postop anemia.   Hgb is minimal decreased to 10.5.       LOS: 5 days    [name] 8/18/2024Patient ID: Wesley [name], male   DOB: 1951/08/30, 72 y.o.   MRN: [id number] Patient ID: Wesley [name], male   DOB: 1951/03/26, 72 y.o.   MRN: [id number]

## 2023-05-27 LAB — BASIC METABOLIC PANEL
Anion gap: 7 (ref 5–15)
BUN: 22 mg/dL (ref 8–23)
CO2: 24 mmol/L (ref 22–32)
Calcium: 8.1 mg/dL — ABNORMAL LOW (ref 8.9–10.3)
Chloride: 108 mmol/L (ref 98–111)
Creatinine, Ser: 1.74 mg/dL — ABNORMAL HIGH (ref 0.61–1.24)
GFR, Estimated: 41 mL/min — ABNORMAL LOW (ref >60)
Glucose, Bld: 132 mg/dL — ABNORMAL HIGH (ref 70–99)
Potassium: 3.4 mmol/L — ABNORMAL LOW (ref 3.5–5.1)
Sodium: 139 mmol/L (ref 135–145)

## 2023-05-27 LAB — HEMOGLOBIN AND HEMATOCRIT, BLOOD
HCT: 30.8 % — ABNORMAL LOW (ref 39.0–52.0)
Hemoglobin: 10 g/dL — ABNORMAL LOW (ref 13.0–17.0)

## 2023-05-27 LAB — CREATININE, FLUID (PLEURAL, PERITONEAL, JP DRAINAGE): Creat, Fluid: 1.7 mg/dL

## 2023-05-27 NOTE — Plan of Care (Signed)
  Problem: Clinical Measurements: Goal: Ability to maintain clinical measurements within normal limits will improve Outcome: Progressing Goal: Will remain free from infection Outcome: Progressing Goal: Respiratory complications will improve Outcome: Progressing   Problem: Education: Goal: Knowledge of General Education information will improve Description: Including pain rating scale, medication(s)/side effects and non-pharmacologic comfort measures Outcome: Progressing

## 2023-05-27 NOTE — Progress Notes (Signed)
RN notified of HR

## 2023-05-27 NOTE — Progress Notes (Signed)
Physical Therapy Treatment Patient Details Name: Wesley [name] MRN: [id number] DOB: 08-11-1951 Today's Date: 05/27/2023   History of Present Illness Pt is a 72 year old male s/p robotic cystoprostatectomy with node dissection and conduit diversion 05/22/23 due to bladder cancer    PT Comments  Pt agreeable to therapy. Pt disconnected/connected R ileal conduit drain on his own. Bowel incontinence episode during session-pt able to perform toileting hygiene with setup assistance. HR 115 bpm, O2 94% on RA during ambulation. CGA with intermittent use of hallway handrail on today.    If plan is discharge home, recommend the following: A little help with walking and/or transfers;A little help with bathing/dressing/bathroom;Help with stairs or ramp for entrance   Can travel by private vehicle        Equipment Recommendations  None recommended by PT (pt not sure if he wants walker-has cane at home which will likely be fine)    Recommendations for Other Services       Precautions / Restrictions Precautions Precautions: Fall Precaution Comments: Left JP drain, ileal conduit; some bowel incontinence Restrictions Weight Bearing Restrictions: No     Mobility  Bed Mobility Overal bed mobility: Needs Assistance Bed Mobility: Supine to Sit, Sit to Supine     Supine to sit: Supervision, HOB elevated, Used rails Sit to supine: Supervision, HOB elevated, Used rails        Transfers Overall transfer level: Needs assistance Equipment used: None Transfers: Sit to/from Stand Sit to Stand: Contact guard assist                Ambulation/Gait Ambulation/Gait assistance: Contact guard assist Gait Distance (Feet): 135 Feet Assistive device: None (vs intermittent use of hallway handrail) Gait Pattern/deviations: Step-through pattern, Decreased stride length, Trunk flexed       General Gait Details: Intermittent unsteadiness. Assessed gait without UE support on today. HR 115 bpm, O2  94% on RA   Stairs             Wheelchair Mobility     Tilt Bed    Modified Rankin (Stroke Patients Only)       Balance Overall balance assessment: Mild deficits observed, not formally tested                                          Cognition Arousal: Alert Behavior During Therapy: WFL for tasks assessed/performed Overall Cognitive Status: Within Functional Limits for tasks assessed                                          Exercises      General Comments        Pertinent Vitals/Pain Pain Assessment Pain Assessment: 0-10 Pain Score: 3  Pain Location: abdomen - surgical sites Pain Descriptors / Indicators: Sore Pain Intervention(s): Monitored during session    Home Living                          Prior Function            PT Goals (current goals can now be found in the care plan section) Progress towards PT goals: Progressing toward goals    Frequency    Min 1X/week      PT Plan  Co-evaluation              AM-PAC PT "6 Clicks" Mobility   Outcome Measure  Help needed turning from your back to your side while in a flat bed without using bedrails?: None Help needed moving from lying on your back to sitting on the side of a flat bed without using bedrails?: None Help needed moving to and from a bed to a chair (including a wheelchair)?: None Help needed standing up from a chair using your arms (e.g., wheelchair or bedside chair)?: None Help needed to walk in hospital room?: A Little Help needed climbing 3-5 steps with a railing? : A Little 6 Click Score: 22    End of Session   Activity Tolerance: Patient tolerated treatment well Patient left: in bed;with call bell/phone within reach   PT Visit Diagnosis: Difficulty in walking, not elsewhere classified (R26.2)     Time: 9562-1308 PT Time Calculation (min) (ACUTE ONLY): 27 min  Charges:    $Gait Training: 23-37 mins PT General  Charges $$ ACUTE PT VISIT: 1 Visit                         Faye Ramsay, PT Acute Rehabilitation  Office: [phone]

## 2023-05-27 NOTE — Progress Notes (Signed)
5 Days Post-Op   Subjective/Chief Complaint:  1 - Bladder Cancer  - s/p robotic cystoprostatectomy with node dissection and conduit diversion 05/22/23. Admitted day prior for bowel prep and stomal marking. Path pending.    2 - Ileus - small bowel anastamosis as part of planned conduit diversion 8/14. Entereg peri-op. Ice chips POD 1. Clears POD 2. Fulls POD 5.   3 - Disposition / Rehab - independent in all ADL's at baseline and lives with wife [name]. PT eval 8/15 without needs.    Today "Wesley [name]" is stable. Tolerating clears with some flatus. Path still pending.    Objective: Vital signs in last 24 hours: Temp:  [97.7 F (36.5 C)-100.4 F (38 C)] 98.2 F (36.8 C) (08/19 0352) Pulse Rate:  [91-103] 91 (08/19 0352) Resp:  [17-18] 18 (08/19 0352) BP: (142-154)/(87-93) 142/87 (08/19 0352) SpO2:  [93 %-97 %] 97 % (08/19 0352) Last BM Date : 05/22/23  Intake/Output from previous day: 08/18 0701 - 08/19 0700 In: 1363 [P.O.:240; I.V.:1123] Out: 2070 [Urine:1750; Drains:320] Intake/Output this shift: No intake/output data recorded.  NAD,  Wears glasses Non-labored breathing on RA RRR SNTND. Some gas noted upper abd by percussion. Recnet port / extraction sites c/d/I. JP with scant non-foul serosanguinous output. RLQ Urostomy pink and patent with Rt (red) and Lt (blue) stents SCD's in place, no c/c/e  Lab Results:  Recent Labs    05/26/23 0520 05/27/23 0444  HGB 10.5* 10.0*  HCT 32.7* 30.8*   BMET Recent Labs    05/26/23 0520 05/27/23 0444  NA 137 139  K 3.5 3.4*  CL 108 108  CO2 20* 24  GLUCOSE 136* 132*  BUN 25* 22  CREATININE 1.74* 1.74*  CALCIUM 8.2* 8.1*   PT/INR No results for input(s): "LABPROT", "INR" in the last 72 hours. ABG No results for input(s): "PHART", "HCO3" in the last 72 hours.  Invalid input(s): "PCO2", "PO2"  Studies/Results: No results found.  Anti-infectives: Anti-infectives (From admission, onward)    Start     Dose/Rate Route  Frequency Ordered Stop   05/22/23 2200  piperacillin-tazobactam (ZOSYN) IVPB 3.375 g        3.375 g 12.5 mL/hr over 240 Minutes Intravenous Every 12 hours 05/22/23 1912 05/23/23 1400   05/22/23 0600  piperacillin-tazobactam (ZOSYN) IVPB 3.375 g       Note to Pharmacy: 2.25 g per Dr. Berneice Heinrich.   3.375 g 100 mL/hr over 30 Minutes Intravenous 30 min pre-op 05/21/23 1457 05/22/23 1233   05/21/23 1630  neomycin (MYCIFRADIN) tablet 500 mg        500 mg Oral Every 4 hours 05/21/23 1534 05/22/23 0040   05/21/23 1630  metroNIDAZOLE (FLAGYL) tablet 500 mg        500 mg Oral Every 4 hours 05/21/23 1534 05/22/23 0039       Assessment/Plan:  SLIV, Adv to fulls, ambulate. Encouraged by some GFR increase. Goals for DC discussed again. Likely in next few days as bowel function further returns.    Loletta Parish. 05/27/2023

## 2023-05-27 NOTE — Progress Notes (Signed)
   05/27/23 0900  Assess: MEWS Score  Temp 98.7 F (37.1 C)  BP 127/80  Pulse Rate (!) 147  Resp 20  SpO2 93 %  O2 Device Room Air  Assess: MEWS Score  MEWS Temp 0  MEWS Systolic 0  MEWS Pulse 3  MEWS RR 0  MEWS LOC 0  MEWS Score 3  MEWS Score Color Yellow  Assess: if the MEWS score is Yellow or Red  Were vital signs accurate and taken at a resting state? Yes  Does the patient meet 2 or more of the SIRS criteria? No  MEWS guidelines implemented  Yes, yellow  Treat  MEWS Interventions Considered administering scheduled or prn medications/treatments as ordered  Take Vital Signs  Increase Vital Sign Frequency  Yellow: Q2hr x1, continue Q4hrs until patient remains green for 12hrs  Escalate  MEWS: Escalate Yellow: Discuss with charge nurse and consider notifying provider and/or RRT  Notify: Charge Nurse/RN  Name of Charge Nurse/RN Notified Victorino Dike, RN  Provider Notification  Provider Name/Title Sebastian Ache  Date Provider Notified 05/27/23  Time Provider Notified [phone]  Method of Notification Page  Notification Reason Other (Comment) (ST 147)  Provider response No new orders  Date of Provider Response 05/27/23  Time of Provider Response [phone]  Assess: SIRS CRITERIA  SIRS Temperature  0  SIRS Pulse 1  SIRS Respirations  0  SIRS WBC 0  SIRS Score Sum  1   Pt HR increased to 150 when RT was giving Breathing th. Rechecked HR 147, pt asymptomatic. Placed on Teli,EKG done to confirm ST with some PVS. Paged MD, no new orders, MD will come and check on pt later. Keep observing per MD. Pt doing good. HR currently went down to 115.

## 2023-05-27 NOTE — Consult Note (Signed)
WOC Nurse ostomy follow up Stoma type/location: RLQ, ileal conduit Stomal assessment/size: 1 3/8" round, prolapsed, red and blue stents in place Peristomal assessment: intact  Treatment options for stomal/peristomal skin: 2" skin barrier ring  Output yellow urine Ostomy pouching: 1pc.flat with 2" skin barrier ring Education provided:  Met with patient and his wife Patient is independent with hooking and disconnecting from night time drainage.  Instruction on cleaning BSD bag and emptying bag provided Allowed patient and his wife to change pouch . Needed assistance with management of stents during pouch change. However overall wife performed changed with minimal assistance.  Cleansed peristomal skin, placed barrier ring. Cut new skin barrier. Placed new pouch. Patient is independent with opening and closing tap on urinary pouch.  6 pouches/barrier rings/new BSD bad/and urinary adapter in room preparing for DC  Enrolled patient in [place] Secure Start Discharge program: Yes  WOC Nurse will follow along with you for continued support with ostomy teaching and care Wesley [name] [facility] MSN, RN, [place], CNS, [place] [phone]

## 2023-05-28 ENCOUNTER — Inpatient Hospital Stay (HOSPITAL_COMMUNITY): Payer: Medicare Other

## 2023-05-28 LAB — URINALYSIS, ROUTINE W REFLEX MICROSCOPIC
Bilirubin Urine: NEGATIVE
Glucose, UA: NEGATIVE mg/dL
Ketones, ur: NEGATIVE mg/dL
Nitrite: NEGATIVE
Protein, ur: 100 mg/dL — AB
Specific Gravity, Urine: 1.013 (ref 1.005–1.030)
WBC, UA: >50 WBC/hpf (ref 0–5)
pH: 5 (ref 5.0–8.0)

## 2023-05-28 LAB — COMPREHENSIVE METABOLIC PANEL
ALT: 31 U/L (ref 0–44)
AST: 42 U/L — ABNORMAL HIGH (ref 15–41)
Albumin: 2.2 g/dL — ABNORMAL LOW (ref 3.5–5.0)
Alkaline Phosphatase: 93 U/L (ref 38–126)
Anion gap: 10 (ref 5–15)
BUN: 25 mg/dL — ABNORMAL HIGH (ref 8–23)
CO2: 21 mmol/L — ABNORMAL LOW (ref 22–32)
Calcium: 7.9 mg/dL — ABNORMAL LOW (ref 8.9–10.3)
Chloride: 107 mmol/L (ref 98–111)
Creatinine, Ser: 1.99 mg/dL — ABNORMAL HIGH (ref 0.61–1.24)
GFR, Estimated: 35 mL/min — ABNORMAL LOW (ref >60)
Glucose, Bld: 164 mg/dL — ABNORMAL HIGH (ref 70–99)
Potassium: 3.2 mmol/L — ABNORMAL LOW (ref 3.5–5.1)
Sodium: 138 mmol/L (ref 135–145)
Total Bilirubin: 0.4 mg/dL (ref 0.3–1.2)
Total Protein: 6.1 g/dL — ABNORMAL LOW (ref 6.5–8.1)

## 2023-05-28 LAB — PROTIME-INR
INR: 1.2 (ref 0.8–1.2)
Prothrombin Time: 15.1 seconds (ref 11.4–15.2)

## 2023-05-28 LAB — CBC WITH DIFFERENTIAL/PLATELET
Abs Immature Granulocytes: 0.05 10*3/uL (ref 0.00–0.07)
Basophils Absolute: 0 10*3/uL (ref 0.0–0.1)
Basophils Relative: 0 %
Eosinophils Absolute: 0 10*3/uL (ref 0.0–0.5)
Eosinophils Relative: 0 %
HCT: 35.7 % — ABNORMAL LOW (ref 39.0–52.0)
Hemoglobin: 11.7 g/dL — ABNORMAL LOW (ref 13.0–17.0)
Immature Granulocytes: 1 %
Lymphocytes Relative: 5 %
Lymphs Abs: 0.5 10*3/uL — ABNORMAL LOW (ref 0.7–4.0)
MCH: 31.7 pg (ref 26.0–34.0)
MCHC: 32.8 g/dL (ref 30.0–36.0)
MCV: 96.7 fL (ref 80.0–100.0)
Monocytes Absolute: 0.6 10*3/uL (ref 0.1–1.0)
Monocytes Relative: 6 %
Neutro Abs: 9.6 10*3/uL — ABNORMAL HIGH (ref 1.7–7.7)
Neutrophils Relative %: 88 %
Platelets: 411 10*3/uL — ABNORMAL HIGH (ref 150–400)
RBC: 3.69 MIL/uL — ABNORMAL LOW (ref 4.22–5.81)
RDW: 13.3 % (ref 11.5–15.5)
WBC: 10.9 10*3/uL — ABNORMAL HIGH (ref 4.0–10.5)
nRBC: 0 % (ref 0.0–0.2)

## 2023-05-28 LAB — SURGICAL PATHOLOGY

## 2023-05-28 LAB — APTT: aPTT: 33 seconds (ref 24–36)

## 2023-05-28 LAB — LACTIC ACID, PLASMA
Lactic Acid, Venous: 2.1 mmol/L (ref 0.5–1.9)
Lactic Acid, Venous: 1.8 mmol/L (ref 0.5–1.9)

## 2023-05-28 MED ORDER — SODIUM CHLORIDE 0.9 % IV SOLN
1.0000 g | INTRAVENOUS | Status: DC
  Administered 2023-05-28: 1 g via INTRAVENOUS
  Filled 2023-05-28 (×2): qty 10

## 2023-05-28 MED ORDER — SODIUM CHLORIDE 0.9 % IV SOLN: INTRAVENOUS | Status: DC

## 2023-05-28 MED ORDER — SODIUM CHLORIDE 0.9 % IV BOLUS
500.0000 mL | Freq: Once | INTRAVENOUS | Status: AC | PRN
  Administered 2023-05-28: 500 mL via INTRAVENOUS

## 2023-05-28 MED ORDER — METOPROLOL TARTRATE 25 MG PO TABS
12.5000 mg | ORAL_TABLET | Freq: Two times a day (BID) | ORAL | Status: DC
  Administered 2023-05-28 – 2023-05-29 (×2): 12.5 mg via ORAL
  Filled 2023-05-28 (×2): qty 1

## 2023-05-28 MED ORDER — LACTATED RINGERS IV BOLUS
500.0000 mL | Freq: Once | INTRAVENOUS | Status: AC
  Administered 2023-05-28: 500 mL via INTRAVENOUS

## 2023-05-28 NOTE — TOC Progression Note (Addendum)
Transition of Care [facility]) - Progression Note    Patient Details  Name: Wesley [name] MRN: [id number] Date of Birth: 1951-08-22  Transition of Care [facility]) CM/SW Contact  Howell Rucks, RN Phone Number: 05/28/2023, 9:14 AM  Clinical Narrative:  [facility] consulted for [facility] for new ostomy teaching and supplies, order entered by MD. Text sent to [facility] [place] requesting adding [facility] RN to already accepted [facility] PT.   -11:51am [facility] HH repKandee Keen, confirmed [facility] RN added for ostomy care and teaching, added to AVS.       Expected Discharge Plan: Home w Home Health Services Barriers to Discharge: Continued Medical Work up  Expected Discharge Plan and Services   Discharge Planning Services: CM Consult Post Acute Care Choice: Home Health Living arrangements for the past 2 months: Single Family Home                           HH Arranged: PT [facility] Agency: [facility] Date [facility] Agency Contacted: 05/25/23 Time HH Agency Contacted: 1416 Representative spoke with at [facility] Agency: Kandee Keen   Social Determinants of Health (SDOH) Interventions SDOH Screenings   Food Insecurity: No Food Insecurity (05/21/2023)  Housing: Low Risk  (05/21/2023)  Transportation Needs: No Transportation Needs (05/21/2023)  Utilities: Not At Risk (05/21/2023)  Alcohol Screen: Low Risk  (02/26/2023)  Depression (PHQ2-9): Low Risk  (02/26/2023)  Financial Resource Strain: Low Risk  (10/09/2021)   Received from [facility] visits prior to 12/08/2022., [facility] visits prior to 12/08/2022.  Physical Activity: Insufficiently Active (10/09/2021)   Received from [facility] visits prior to 12/08/2022., [facility] visits prior to 12/08/2022.  Social Connections: Unknown (02/12/2022)   Received from [facility]  Stress: Stress Concern Present (10/09/2021)   Received from [facility] visits prior to 12/08/2022.,  [facility] visits prior to 12/08/2022.  Tobacco Use: Medium Risk (05/22/2023)    Readmission Risk Interventions    05/23/2023   10:18 AM  Readmission Risk Prevention Plan  Post Dischage Appt Complete  Medication Screening Complete  Transportation Screening Complete

## 2023-05-28 NOTE — Progress Notes (Signed)
Per Rapid Response RN patient's Red  MEWS status qualifies for Code Sepsis. Rapid Response RN also recommending fluids for this patient. Attempted to reach Berneice Heinrich, MD by calling [facility] front desk, the first time call was transferred w/ no answers. The second call the front desk took a message regarding these requests by Rapid Response. Will continue to monitor patient and follow Red MEWS guidelines per protocol.

## 2023-05-28 NOTE — Progress Notes (Signed)
   05/28/23 0930  Assess: MEWS Score  Temp 98.3 F (36.8 C)  BP (!) 83/68  MAP (mmHg) 72  Pulse Rate (!) 137  Resp 19  SpO2 94 %  O2 Device Room Air  Patient Activity (if Appropriate) In bed  Assess: MEWS Score  MEWS Temp 0  MEWS Systolic 1  MEWS Pulse 3  MEWS RR 0  MEWS LOC 0  MEWS Score 4  MEWS Score Color Red  Assess: if the MEWS score is Yellow or Red  Were vital signs accurate and taken at a resting state? Yes  Does the patient meet 2 or more of the SIRS criteria? No  Does the patient have a confirmed or suspected source of infection? No  MEWS guidelines implemented  Yes, red  Treat  MEWS Interventions Considered administering scheduled or prn medications/treatments as ordered  Take Vital Signs  Increase Vital Sign Frequency  Red: Q1hr x2, continue Q4hrs until patient remains green for 12hrs  Escalate  MEWS: Escalate Red: Discuss with charge nurse and notify provider. Consider notifying RRT. If remains red for 2 hours consider need for higher level of care  Notify: Charge Nurse/RN  Name of Charge Nurse/RN Notified Baird Lyons, RN  Provider Notification  Provider Name/Title Sebastian Ache  Date Provider Notified 05/28/23  Time Provider Notified 0932  Method of Notification Page (x2)  Notification Reason Other (Comment) (RED MEWS- low BP/Tachycardia (sustaining))  Provider response No new orders  Date of Provider Response 05/28/23  Time of Provider Response 0940  Notify: Rapid Response  Name of Rapid Response RN Notified Deborah, RN  Date Rapid Response Notified 05/28/23  Time Rapid Response Notified 0950  Assess: SIRS CRITERIA  SIRS Temperature  0  SIRS Pulse 1  SIRS Respirations  0  SIRS WBC 0  SIRS Score Sum  1   Manny MD notified of patient's current status/Red MEWS vital signs. No new orders or interventions needed at this time per MD. Charge RN and Rapid response also notified and came to bedside. Will continue to monitor patient and implement Red MEWS  guidelines.

## 2023-05-28 NOTE — Progress Notes (Signed)
Elink following code sepsis °

## 2023-05-28 NOTE — Progress Notes (Addendum)
6 Days Post-Op   Subjective/Chief Complaint:  1 - Bladder Cancer  - s/p robotic cystoprostatectomy with node dissection and conduit diversion 05/22/23. Admitted day prior for bowel prep and stomal marking. Path pending. JP Cr same as serum 8/19.    2 - Ileus - small bowel anastamosis as part of planned conduit diversion 8/14. Entereg peri-op. Ice chips POD 1. Clears POD 2. Fulls POD 5 with resumed bowel function then regular diet POD 6.    3 - Disposition / Rehab - independent in all ADL's at baseline and lives with wife Wesley [name]. PT eval 8/15 without needs. Case management working to arrange home health RN for target discharge 8/21.    Today "Wesley [name]" is progressing. Tolerated full diet with small BM x 2 past 24 hours. Ambulating, pain controlled. Path still pending. Getting more facile with ostomy changes.    Objective: Vital signs in last 24 hours: Temp:  [97.9 F (36.6 C)-98.7 F (37.1 C)] 98.2 F (36.8 C) (08/20 0314) Pulse Rate:  [80-147] 88 (08/20 0314) Resp:  [17-20] 18 (08/20 0314) BP: (109-138)/(77-93) 138/80 (08/20 0314) SpO2:  [93 %-98 %] 98 % (08/20 0314) Last BM Date : 05/27/23  Intake/Output from previous day: 08/19 0701 - 08/20 0700 In: 480 [P.O.:480] Out: 1730 [Urine:1470; Drains:260] Intake/Output this shift: No intake/output data recorded.   NAD,  Wears glasses Non-labored breathing on RA RRR SNTND. Recnet port / extraction sites c/d/I. JP with scant non-foul serosanguinous output. RLQ Urostomy pink and patent with Rt (red) and Lt (blue) stents SCD's in place, no c/c/e  Lab Results:  Recent Labs    05/26/23 0520 05/27/23 0444  HGB 10.5* 10.0*  HCT 32.7* 30.8*   BMET Recent Labs    05/26/23 0520 05/27/23 0444  NA 137 139  K 3.5 3.4*  CL 108 108  CO2 20* 24  GLUCOSE 136* 132*  BUN 25* 22  CREATININE 1.74* 1.74*  CALCIUM 8.2* 8.1*   PT/INR No results for input(s): "LABPROT", "INR" in the last 72 hours. ABG No results for input(s): "PHART",  "HCO3" in the last 72 hours.  Invalid input(s): "PCO2", "PO2"  Studies/Results: No results found.  Anti-infectives: Anti-infectives (From admission, onward)    Start     Dose/Rate Route Frequency Ordered Stop   05/22/23 2200  piperacillin-tazobactam (ZOSYN) IVPB 3.375 g        3.375 g 12.5 mL/hr over 240 Minutes Intravenous Every 12 hours 05/22/23 1912 05/23/23 1400   05/22/23 0600  piperacillin-tazobactam (ZOSYN) IVPB 3.375 g       Note to Pharmacy: 2.25 g per Dr. Berneice Heinrich.   3.375 g 100 mL/hr over 30 Minutes Intravenous 30 min pre-op 05/21/23 1457 05/22/23 1233   05/21/23 1630  neomycin (MYCIFRADIN) tablet 500 mg        500 mg Oral Every 4 hours 05/21/23 1534 05/22/23 0040   05/21/23 1630  metroNIDAZOLE (FLAGYL) tablet 500 mg        500 mg Oral Every 4 hours 05/21/23 1534 05/22/23 0039       Assessment/Plan:  Reg diet, continue ambulation. CM consult to help arrange [facility] for new ostomy teaching with target discharge 8/21. Appreciate PT, ostomy RN, case management teams.    Loletta Parish. 05/28/2023   4 - Episodic Sinus Tachycardia - pt with occasional sinus tach pre-op and intra-op at cystectomy. Some noted post-op as well with EKG x several with sinus tach (no atrial or ventricular arrythmia) during episodes of pain and / or anxiety.  Erring on side of caution UCX, BCX obtained 8/20, Lactate (very marginal elevation 2.1). No fevers / leukocytosis.  Having stools and ambulating to bathroom w/o dizziness.  NAD, still at very talkatinve, eating a salad AOx3 Improve HV (low 100s) No r/g JP non-foul sereous No c/c/e  FAvor mild dehydration. Restart IVF, empiric ABX pending short term CX results. Appreciate NSG team vigilance.

## 2023-05-28 NOTE — Significant Event (Addendum)
Rapid Response Event Note   Reason for Call :  Hypotension  Initial Focused Assessment:  Patient resting in bed, alert and oriented x4. No signs of distress. HR 135-140 ST on monitor, SBP 80s. Per patient he has had diarrhea multiple times overnight and this morning and is having poor PO intake (clear liquid diet). Lungs clear bilaterally, no crackles heard. Denies dizziness or shortness of breath on assessment.   Pulse equal, ST. Hypotension confirmed with manual BP by bedside staff- see flowsheets Manny MD paged by bedside staff.   Rapid response protocol for hypotension initiated, 500cc bolus ordered per standing orders for low blood pressure.   Code sepsis protocol ordered from Rapid response standing orders as patient met two SIRS criteria (heart rate-HR 140s and respiratory rate-RR >20/minute)    Discussed with Bell MD for notification. Informed after than bedside RN was able to reach Urology PA and received orders.   Event Summary:   MD Notified:  paged by bedside staff Call Time: 1000 Arrival Time: 1010 End Time: 1020  Rosaria Ferries, RN

## 2023-05-28 NOTE — Plan of Care (Signed)
  Problem: Education: Goal: Knowledge of General Education information will improve Description: Including pain rating scale, medication(s)/side effects and non-pharmacologic comfort measures Outcome: Progressing   Problem: Clinical Measurements: Goal: Ability to maintain clinical measurements within normal limits will improve Outcome: Progressing   Problem: Activity: Goal: Risk for activity intolerance will decrease Outcome: Progressing   Problem: Coping: Goal: Level of anxiety will decrease Outcome: Progressing   Problem: Pain Managment: Goal: General experience of comfort will improve Outcome: Progressing   Problem: Clinical Measurements: Goal: Postoperative complications will be avoided or minimized Outcome: Progressing

## 2023-05-29 MED ORDER — OXYCODONE-ACETAMINOPHEN 5-325 MG PO TABS
1.0000 | ORAL_TABLET | Freq: Four times a day (QID) | ORAL | 0 refills | Status: DC | PRN
Start: 2023-05-29 — End: 2023-06-24

## 2023-05-29 MED ORDER — LEVETIRACETAM 500 MG PO TABS: 500.0000 mg | ORAL_TABLET | Freq: Two times a day (BID) | ORAL | Status: DC

## 2023-05-29 NOTE — Plan of Care (Signed)
  Problem: Education: Goal: Knowledge of General Education information will improve Description: Including pain rating scale, medication(s)/side effects and non-pharmacologic comfort measures Outcome: Adequate for Discharge   Problem: Health Behavior/Discharge Planning: Goal: Ability to manage health-related needs will improve Outcome: Adequate for Discharge   Problem: Clinical Measurements: Goal: Ability to maintain clinical measurements within normal limits will improve Outcome: Adequate for Discharge Goal: Will remain free from infection Outcome: Adequate for Discharge Goal: Diagnostic test results will improve Outcome: Adequate for Discharge Goal: Respiratory complications will improve Outcome: Adequate for Discharge Goal: Cardiovascular complication will be avoided Outcome: Adequate for Discharge   Problem: Activity: Goal: Risk for activity intolerance will decrease Outcome: Adequate for Discharge   Problem: Nutrition: Goal: Adequate nutrition will be maintained Outcome: Adequate for Discharge   Problem: Coping: Goal: Level of anxiety will decrease Outcome: Adequate for Discharge   Problem: Elimination: Goal: Will not experience complications related to bowel motility Outcome: Adequate for Discharge Goal: Will not experience complications related to urinary retention Outcome: Adequate for Discharge   Problem: Pain Managment: Goal: General experience of comfort will improve Outcome: Adequate for Discharge   Problem: Safety: Goal: Ability to remain free from injury will improve Outcome: Adequate for Discharge   Problem: Skin Integrity: Goal: Risk for impaired skin integrity will decrease Outcome: Adequate for Discharge   Problem: Education: Goal: Required Educational Video(s) Outcome: Adequate for Discharge   Problem: Clinical Measurements: Goal: Ability to maintain clinical measurements within normal limits will improve Outcome: Adequate for  Discharge Goal: Postoperative complications will be avoided or minimized Outcome: Adequate for Discharge   Problem: Skin Integrity: Goal: Demonstration of wound healing without infection will improve Outcome: Adequate for Discharge   

## 2023-05-29 NOTE — Final Progress Note (Signed)
Patient PIV removed. Patient pharmacy verified. Home dressing care explained to patient. Follow-up appointments explained. Awaiting patient's wife to return for discharge

## 2023-05-29 NOTE — Discharge Summary (Signed)
Physician Discharge Summary  Patient ID: Wesley [name] MRN: [id number] DOB/AGE: 74-Nov-1952 74 y.o.  Admit date: 05/21/2023 Discharge date: 05/29/2023  Admission Diagnoses: Bladder Cancer  Discharge Diagnoses: Metastatic Bladder Cancer Principal Problem:   Bladder cancer [facility])   Discharged Condition: good  Hospital Course:   1 - Bladder Cancer  - s/p robotic cystoprostatectomy with node dissection and conduit diversion 05/22/23. Admitted day prior for bowel prep and stomal marking. Path pending. JP Cr same as serum 8/19.    2 - Ileus - small bowel anastamosis as part of planned conduit diversion 8/14. Entereg peri-op. Ice chips POD 1. Clears POD 2. Fulls POD 5 with resumed bowel function then regular diet POD 6.    3 - Disposition / Rehab - independent in all ADL's at baseline and lives with wife Wesley [name]. PT eval 8/15 without needs. Case management working to arrange home health RN for target discharge 8/21.   4 - Episodic Sinus Tachycardia - pt with occasional sinus tach pre-op and intra-op at cystectomy. Some noted post-op as well with EKG x several with sinus tach (no atrial or ventricular arrythmia) during episodes of pain and / or anxiety. Erring on side of caution UCX, BCX obtained 8/20, Lactate (very marginal elevation 2.1). No fevers / leukocytosis.By 8/21 lactate normalized, remained afebrile and CX no growth to date  By the afternoon of 8/21, he is ambulatory, tolleratign regualr diet with resumed bowel function, pain controlled on PO meds, and felt to be adequate for discharge. JP and Rt neph tube removed prior to DC.  Consults:  PT, Ostomy RN, Case Management  Significant Diagnostic Studies: labs: as per above  Treatments: surgery: as per above  Discharge Exam: Blood pressure 130/88, pulse 85, temperature 99 F (37.2 C), temperature source Oral, resp. rate 16, height 5\' 6"  (1.676 m), weight 52.5 kg, SpO2 99%.  NAD,  Wears glasses, wife at bedside.,  Non-labored  breathing on RA RRR SNTND. Recnet port / extraction sites c/d/I. JP with scant non-foul serosanguinous output. Removed and dry dressign placed. Neph tube out as well.  RLQ Urostomy pink and patent with Rt (red) and Lt (blue) stents SCD's in place, no c/c/e  Disposition:    Allergies as of 05/29/2023       Reactions   Sulfa Antibiotics Rash   Childhood        Medication List     STOP taking these medications    finasteride 5 MG tablet Commonly known as: PROSCAR   Myrbetriq 50 MG Tb24 tablet Generic drug: mirabegron ER   tamsulosin 0.4 MG Caps capsule Commonly known as: FLOMAX   Trospium Chloride 60 MG Cp24       TAKE these medications    amLODipine 5 MG tablet Commonly known as: NORVASC Take 5 mg by mouth at bedtime.   Biotin 5000 5 MG Caps Generic drug: Biotin Take 5 mg by mouth daily.   Breo Ellipta 100-25 MCG/ACT Aepb Generic drug: fluticasone furoate-vilanterol TAKE 1 PUFF BY MOUTH EVERY DAY   celecoxib 100 MG capsule Commonly known as: CELEBREX Take 100 mg by mouth every evening.   doxepin 25 MG capsule Commonly known as: SINEQUAN Take 25 mg by mouth at bedtime.   ferrous sulfate 325 (65 FE) MG tablet Take 325 mg by mouth daily.   ketoconazole 2 % shampoo Commonly known as: NIZORAL Apply 1 Application topically daily as needed (yeast).   ketoconazole 2 % cream Commonly known as: NIZORAL Apply 1 Application topically daily as needed  for irritation.   levETIRAcetam 500 MG tablet Commonly known as: KEPPRA Take 500 mg by mouth 3 (three) times daily.   Oxtellar XR 600 MG Tb24 Generic drug: OXcarbazepine ER Take 600 mg by mouth daily in the afternoon.   oxyCODONE-acetaminophen 5-325 MG tablet Commonly known as: Percocet Take 1 tablet by mouth every 6 (six) hours as needed for severe pain or moderate pain (post-operatively).   primidone 50 MG tablet Commonly known as: MYSOLINE Take 100 mg by mouth at bedtime.   senna-docusate 8.6-50 MG  tablet Commonly known as: Senokot-S Take 1 tablet by mouth 2 (two) times daily. While taking strong pain meds to prevent constipation. What changed:  when to take this reasons to take this additional instructions   ustekinumab 90 MG/ML Sosy injection Commonly known as: STELARA Inject 90 mg into the skin every 8 (eight) weeks.        Follow-up Information     Berneice Heinrich Delbert Phenix., MD Follow up on 06/17/2023.   Specialty: Urology Why: at 2:30 for MD visit Contact information: [address] [place] [address] [phone]         Care, [facility] Follow up.   Specialty: Home Health Services Why: Home Health Physical Therapy  Home Health Nursing for ostomy care and teaching Contact information: [address] [place] [address] [phone]                 Signed: Loletta Parish. 05/29/2023, 12:50 PM

## 2023-05-29 NOTE — Progress Notes (Signed)
PHARMACY NOTE -   RENAL DOSE ADJUSTMENT   Pharmacy to assist with  renal dose adjustment.  Patient has been on Keppra 500mg  TID SCr 1.99, estimated CrCl 24.  Plan: For CrCl<30, will adjust Keppra dosing to 1000mg /d Change to 500mg  BID.   Shanan Fitzpatrick S. Merilynn Finland, PharmD, BCPS Clinical Staff Pharmacist [email or web address]

## 2023-05-29 NOTE — Consult Note (Signed)
WOC Nurse ostomy follow up Stoma type/location: RLQ, ileal conduit Stomal assessment/size: 1 3/8" round, pink, moist, prolapsed aprox. 1.5cm  Peristomal assessment: intact  Treatment options for stomal/peristomal skin: 2" ostomy barrier ring Output yellow urine, lots of mucous today with pouch change  Ostomy pouching: 1pc.flat with 2" skin barrier ring  Education provided:  Allowed patient and his wife to change pouch, needed reminders about stents and how to protect during change. Wife cut new skin barrier, removed old pouch, cleansed peristomal skin (with some assurance), placed barrier ring and new pouch. They are independent with spout closure and hooking to night time drainage.  Enrolled patient in [place] Secure Start Discharge program: Yes  Planned for DC today, Dr. Berneice Heinrich at the bedside, drains removed. Tolerating diet 6 pouches, barrier rings, leg bag, BSD bag, pattern, educational materials and pattern at bedside.   Rileigh Kawashima [facility] MSN, RN, [place], CNS, [place] [phone]

## 2023-05-29 NOTE — Plan of Care (Signed)
  Problem: Nutrition: Goal: Adequate nutrition will be maintained Outcome: Progressing   Problem: Safety: Goal: Ability to remain free from injury will improve Outcome: Progressing   Problem: Skin Integrity: Goal: Risk for impaired skin integrity will decrease Outcome: Progressing   

## 2023-05-29 NOTE — TOC Transition Note (Signed)
Transition of Care [facility]) - CM/SW Discharge Note   Patient Details  Name: Wesley [name] MRN: [id number] Date of Birth: [birth year]  [facility]) CM/SW Contact:  Howell Rucks, RN Phone Number: 05/29/2023, 2:02 PM   Clinical Narrative:  DC home today with home health w/ [facility] No further TOC needs identified.     Final next level of care: Home w Home Health Services Barriers to Discharge: No Barriers Identified   Patient Goals and CMS Choice      Discharge Placement                         Discharge Plan and Services Additional resources added to the After Visit Summary for     Discharge Planning Services: CM Consult Post Acute Care Choice: Home Health                    HH Arranged: PT [facility] Agency: [facility] Care Date [facility] Agency Contacted: 05/25/23 Time HH Agency Contacted: 1416 Representative spoke with at [facility] Agency: Kandee Keen  Social Determinants of Health (SDOH) Interventions SDOH Screenings   Food Insecurity: No Food Insecurity (05/21/2023)  Housing: Low Risk  (05/21/2023)  Transportation Needs: No Transportation Needs (05/21/2023)  Utilities: Not At Risk (05/21/2023)  Alcohol Screen: Low Risk  (02/26/2023)  Depression (PHQ2-9): Low Risk  (02/26/2023)  Financial Resource Strain: Low Risk  (10/09/2021)   [facility] from [facility] visits prior to 12/08/2022., [facility] visits prior to 12/08/2022.  Physical Activity: Insufficiently Active (10/09/2021)   Received from [facility] visits prior to 12/08/2022., [facility] visits prior to 12/08/2022.  Social Connections: Unknown (02/12/2022)   Received from [facility], [facility]  Stress: Stress Concern Present (10/09/2021)   Received from [facility] visits prior to 12/08/2022., [facility] visits prior to 12/08/2022.  Tobacco Use: Medium Risk (05/22/2023)      Readmission Risk Interventions    05/23/2023   10:18 AM  Readmission Risk Prevention Plan  Post Dischage Appt Complete  Medication Screening Complete  Transportation Screening Complete

## 2023-06-02 LAB — CULTURE, BLOOD (ROUTINE X 2)
Culture: NO GROWTH
Culture: NO GROWTH
Special Requests: ADEQUATE
Special Requests: ADEQUATE

## 2023-06-05 DIAGNOSIS — M25511 Pain in right shoulder: Secondary | ICD-10-CM | POA: Diagnosis not present

## 2023-06-13 ENCOUNTER — Other Ambulatory Visit (HOSPITAL_COMMUNITY): Payer: Self-pay | Admitting: Nurse Practitioner

## 2023-06-13 DIAGNOSIS — N99528 Other complication of other external stoma of urinary tract: Secondary | ICD-10-CM

## 2023-06-13 DIAGNOSIS — H0289 Other specified disorders of eyelid: Secondary | ICD-10-CM | POA: Diagnosis not present

## 2023-06-17 DIAGNOSIS — R8271 Bacteriuria: Secondary | ICD-10-CM | POA: Diagnosis not present

## 2023-06-18 DIAGNOSIS — D1801 Hemangioma of skin and subcutaneous tissue: Secondary | ICD-10-CM | POA: Diagnosis not present

## 2023-06-18 DIAGNOSIS — L821 Other seborrheic keratosis: Secondary | ICD-10-CM | POA: Diagnosis not present

## 2023-06-18 DIAGNOSIS — D044 Carcinoma in situ of skin of scalp and neck: Secondary | ICD-10-CM | POA: Diagnosis not present

## 2023-06-18 DIAGNOSIS — L570 Actinic keratosis: Secondary | ICD-10-CM | POA: Diagnosis not present

## 2023-06-18 DIAGNOSIS — L814 Other melanin hyperpigmentation: Secondary | ICD-10-CM | POA: Diagnosis not present

## 2023-06-18 DIAGNOSIS — C44519 Basal cell carcinoma of skin of other part of trunk: Secondary | ICD-10-CM | POA: Diagnosis not present

## 2023-06-21 DIAGNOSIS — M6281 Muscle weakness (generalized): Secondary | ICD-10-CM | POA: Diagnosis not present

## 2023-06-21 DIAGNOSIS — Z7409 Other reduced mobility: Secondary | ICD-10-CM | POA: Diagnosis not present

## 2023-06-24 ENCOUNTER — Other Ambulatory Visit: Payer: Self-pay | Admitting: Pulmonary Disease

## 2023-06-24 ENCOUNTER — Encounter: Payer: Self-pay | Admitting: Pulmonary Disease

## 2023-06-24 ENCOUNTER — Ambulatory Visit: Payer: Medicare Other | Admitting: Pulmonary Disease

## 2023-06-24 VITALS — BP 134/70 | HR 70 | Ht 67.0 in | Wt 128.0 lb

## 2023-06-24 DIAGNOSIS — R0602 Shortness of breath: Secondary | ICD-10-CM | POA: Diagnosis not present

## 2023-06-24 NOTE — Patient Instructions (Signed)
I will see you in about 3 months  Order placed for CT scan of the chest with contrast to make sure you do not have a blood clot  This will also evaluate the bases of the lungs to make sure that there is no significant infiltrate or effusion that you had before  Call us with significant concerns

## 2023-06-24 NOTE — Progress Notes (Signed)
Wesley [name]    [id number]    [birth year]  Primary Care Physician:Smith, Teresita Madura, MD  Referring Physician: [name], MD [address] [facility] [address]  [place] [address]  Chief complaint:   Patient was seen previously for loculated pleural effusion that did resolve Recently had bladder surgery following which in the last couple of days started having pleuritic chest pains  HPI:  Pleuritic chest pain for the last few days, pain with deep breathing Some shortness of breath  Did recover fully from previous loculated effusion  Urologic procedure had led to his pleural effusion previously  Recently had a prolonged surgery for bladder cancer  Was treated for UTI, had 2 procedures to combat a renal stone, had a stent placed and drain placed Started having some discomfort afterwards and this led to being evaluated in the emergency department Has used multiple courses of antibiotics when he was dealing with a urinary tract infection, does not feel the infection is ongoing at present  Did lose some weight following his previous surgical intervention When he was evaluated in the emergency room he was treated with a course of Levaquin and this follow-up was ordered  10-pack-year smoking history quit a while back   Outpatient Encounter Medications as of 06/24/2023  Medication Sig   amLODipine (NORVASC) 5 MG tablet Take 5 mg by mouth at bedtime.   Biotin (BIOTIN 5000) 5 MG CAPS Take 5 mg by mouth daily.   celecoxib (CELEBREX) 100 MG capsule Take 100 mg by mouth every evening.   doxepin (SINEQUAN) 25 MG capsule Take 25 mg by mouth at bedtime.   ferrous sulfate 325 (65 FE) MG tablet Take 325 mg by mouth daily.   fluticasone furoate-vilanterol (BREO ELLIPTA) 100-25 MCG/ACT AEPB TAKE 1 PUFF BY MOUTH EVERY DAY   ketoconazole (NIZORAL) 2 % cream Apply 1 Application topically daily as needed for irritation.   ketoconazole (NIZORAL) 2 % shampoo Apply 1  Application topically daily as needed (yeast).   levETIRAcetam (KEPPRA) 500 MG tablet Take 500 mg by mouth 3 (three) times daily.   OXcarbazepine ER (OXTELLAR XR) 600 MG TB24 Take 600 mg by mouth daily in the afternoon.   primidone (MYSOLINE) 50 MG tablet Take 100 mg by mouth at bedtime.   ustekinumab (STELARA) 90 MG/ML SOSY injection Inject 90 mg into the skin every 8 (eight) weeks.   [DISCONTINUED] oxyCODONE-acetaminophen (PERCOCET) 5-325 MG tablet Take 1 tablet by mouth every 6 (six) hours as needed for severe pain or moderate pain (post-operatively).   [DISCONTINUED] senna-docusate (SENOKOT-S) 8.6-50 MG tablet Take 1 tablet by mouth 2 (two) times daily. While taking strong pain meds to prevent constipation. (Patient taking differently: Take 1 tablet by mouth 2 (two) times daily as needed for mild constipation.)   Facility-Administered Encounter Medications as of 06/24/2023  Medication   magnesium citrate solution 1 Bottle   magnesium citrate solution 1 Bottle    Allergies as of 06/24/2023 - Review Complete 06/24/2023  Allergen Reaction Noted   Sulfa antibiotics Rash 03/01/2012    Past Medical History:  Diagnosis Date   Arthritis    Atypical chest pain    cardiologist-- dr Leeann Must;  chronic occasional,  pressure / discomfort, LOV 07/14/21 (as of 06/08/22)   Bladder calculus    BPH with obstruction/lower urinary tract symptoms    Follows with Dr. Kasandra Knudsen, urologist.   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 Daphane Shepherd at [facility] neurology, LOV w/ FNP on 03/21/22 in Epic ( as of 06/08/22)   Generalized abdominal pain    w/  bloating due to UC  take doxepin   GERD (gastroesophageal reflux disease)    H/O urinary  retention    History of bladder stone    History of hiatal hernia    History of kidney stones    History of prostate cancer 2010   urologist--- dr pace;   dx 2010,  Gleason 4+3, PSA 5.2;   03-04-2009  s/p radiactive prostate seed implants and external beam radiation   History of repair of hiatal hernia    02-11-2017  s/p lap paraesophageal hernia repair w/ nissen fundoplication   History of skin cancer    Hx of resection of meningioma 02/11/2009   benign right temperal lobe meningioma s/p resection  (residual hearing loss)   (neurosurgeon-- dr Tyrone Sage)   Hypertension    followed by cardiologist and pcp   Insomnia    Migraine with aura and without status migrainosus, not intractable    neurologist-- dr Marlena Clipper   Prostate cancer [facility])    Simple renal cyst    bilateral ;   followed by urologist   Ulcerative colitis (HCC)    followed by dr v. Rhetta Mura (GI);  dx 2010;  current treatment stelara q8wks, LOV 06/07/22 (as of 06/08/22)   Wears glasses     Past Surgical History:  Procedure Laterality Date   BOTOX INJECTION N/A 01/29/2023   Procedure: BOTOX INJECTION 100 UNITS;  Surgeon: Noel Christmas, MD;  Location: [facility];  Service: Urology;  Laterality: N/A;   BRAIN MENINGIOMA EXCISION  02/11/2009   @[facility] ;  craniotomy , right temporal medial sphonoid wing resection   CARPAL TUNNEL RELEASE Bilateral    right 09-05-2016  @[facility] ;   left 2013   COLONOSCOPY  12/2019   COLONOSCOPY  01/2022   CYSTOSCOPY  03/05/2012   Procedure: CYSTOSCOPY;  Surgeon: Antony Haste, MD;  Location: [facility];  Service: Urology;  Laterality: N/A;  Cysto Clot Evacuation and Fulgeration Of [place] ([place])   CYSTOSCOPY WITH INJECTION N/A 05/22/2023   Procedure: CYSTOSCOPY WITH INJECTION OF INDOCYANINE GREEN DYE;  Surgeon: Loletta Parish., MD;  Location: [facility];  Service: Urology;  Laterality: N/A;   CYSTOSCOPY WITH LITHOLAPAXY N/A 03/13/2019   Procedure: CYSTOSCOPY WITH lASER  LITHOLAPAXY, DILATION OF STRICTURE;  Surgeon: Ihor Gully, MD;  Location: [facility];  Service: Urology;  Laterality: N/A;   CYSTOSCOPY WITH LITHOLAPAXY N/A 01/02/2022   Procedure: CYSTOSCOPY WITH LITHOTRIPSY;  Surgeon: Noel Christmas, MD;  Location: [facility];  Service: Urology;  Laterality: N/A;   CYSTOSCOPY WITH RETROGRADE PYELOGRAM, URETEROSCOPY AND STENT PLACEMENT Right 03/13/2022   Procedure: CYSTOSCOPY WITH RETROGRADE PYELOGRAM, URETEROSCOPY AND STENT PLACEMENT;  Surgeon: Noel Christmas, MD;  Location: [facility];  Service: Urology;  Laterality: Right;  75 MINS   CYSTOSCOPY WITH RETROGRADE PYELOGRAM, URETEROSCOPY AND STENT PLACEMENT Right 08/07/2022   Procedure: CYSTOSCOPY WITH RETROGRADE PYELOGRAM, URETEROSCOPY AND STENT PLACEMENT, REMOVAL OF NEPHROURETERAL STENT;  Surgeon: Noel Christmas, MD;  [facility];  Service: Urology;  Laterality: Right;  75 MINS  CYSTOSCOPY WITH URETHRAL DILATATION N/A 08/23/2020   Procedure: CYSTOSCOPY WITH URETHRAL DILATATION, BLADDER BIOPSY, FULGERATION;  Surgeon: Noel Christmas, MD;  Location: [facility];  Service: Urology;  Laterality: N/A;  1 HR   CYSTOSCOPY WITH URETHRAL DILATATION N/A 03/13/2021   Procedure: CYSTOSCOPY WITH URETHRAL BALLOON DILATATION/ FOLEY CATHETER PLACEMENT;  Surgeon: Noel Christmas, MD;  [facility];  Service: Urology;  Laterality: N/A;   CYSTOSCOPY WITH URETHRAL DILATATION Left 07/08/2009   @[facility] :   with left  RTG   CYSTOSCOPY WITH URETHRAL DILATATION N/A 06/19/2022   Procedure: CYSTOSCOPY WITH OPTILUME URETHRAL DILATATION;  Surgeon: Noel Christmas, MD;  Location: [facility];  Service: Urology;  Laterality: N/A;   CYSTOSCOPY/URETEROSCOPY/HOLMIUM LASER/STENT PLACEMENT Bilateral 01/29/2023   Procedure: DIAGNOSTIC CYSTOSCOPY , LEFT RETROGRADE PYELOGRAM, LEFT URETERAL STENT PLACEMENT, AND STONE BASKETING;  Surgeon: Noel Christmas, MD;  Location: [facility];  Service: Urology;  Laterality: Bilateral;   HOLMIUM LASER APPLICATION N/A 08/23/2020   Procedure: HOLMIUM LASER APPLICATION OF URETHRAL STONE;  Surgeon: Noel Christmas, MD;  Location: [facility];  Service: Urology;  Laterality: N/A;   HOLMIUM LASER APPLICATION Right 03/13/2022   Procedure: HOLMIUM LASER APPLICATION;  Surgeon: Noel Christmas, MD;  Location: [facility];  Service: Urology;  Laterality: Right;   HOLMIUM LASER APPLICATION Right 08/07/2022   Procedure: HOLMIUM LASER APPLICATION;  Surgeon: Noel Christmas, MD;  [facility];  Service: Urology;  Laterality: Right;   INGUINAL HERNIA REPAIR Right 05/03/2015   @[facility]    INSERTION PROSTATE RADIATION SEED  03/04/2009   @[facility]    IR NEPHROSTOMY PLACEMENT RIGHT  07/18/2022   IR URETERAL STENT RIGHT NEW ACCESS W/O SEP NEPHROSTOMY CATH  02/04/2023   KNEE CARTILAGE SURGERY Left    1980S   LAPAROSCOPIC PARAESOPHAGEAL HERNIA REPAIR  02/11/2017   @[facility] ;  W/  NISSEN FUNDOPLICATION (MESH)   LYMPH NODE DISSECTION Bilateral 05/22/2023   Procedure: LYMPH NODE DISSECTION;  Surgeon: Loletta Parish., MD;  [facility];  Service: Urology;  Laterality: Bilateral;   NEPHROLITHOTOMY Right 03/29/2023   Procedure: NEPHROLITHOTOMY PERCUTANEOUS;  Surgeon: Loletta Parish., MD;  Location[facility];  Service: Urology;  Laterality: Right;  2 HRS   PROSTATE BIOPSY     ROBOT ASSISTED LAPAROSCOPIC COMPLETE CYSTECT ILEAL CONDUIT N/A 05/22/2023   Procedure: XI ROBOTIC ASSISTED LAPAROSCOPIC COMPLETE CYSTECT ILEAL CONDUIT;  Surgeon: Loletta Parish., MD;  [facility];  Service: Urology;  Laterality: N/A;   ROBOT ASSISTED LAPAROSCOPIC RADICAL PROSTATECTOMY N/A 05/22/2023   Procedure: XI ROBOTIC ASSISTED LAPAROSCOPIC RADICAL PROSTATECTOMY;  Surgeon: Loletta Parish., MD;  [facility];  Service: Urology;  Laterality: N/A;   TOE SURGERY     TRANSURETHRAL  RESECTION OF BLADDER TUMOR N/A 01/02/2022   Procedure: TRANSURETHRAL RESECTION OF BLADDER TUMOR (TURBT);  Surgeon: Noel Christmas, MD;  Location: [facility];  Service: Urology;  Laterality: N/A;   TRANSURETHRAL RESECTION OF BLADDER TUMOR N/A 01/29/2023   Procedure: TRANSURETHRAL RESECTION OF BLADDER TUMOR (TURBT);  Surgeon: Noel Christmas, MD;  Location: [facility];  Service: Urology;  Laterality: N/A;    No family history on file.  Social History   Socioeconomic History   Marital status: Married    Spouse name: Not on file   Number of children: Not on file   Years of education: Not on file   Highest education level: Not on file  Occupational History   Not on file  Tobacco Use   Smoking status: Former    Current packs/day: 0.00    Average packs/day: 1 pack/day for 10.0 years (10.0 ttl pk-yrs)    Types: Cigarettes    Start date: 03/01/1977    Quit date: 03/02/1987    Years since quitting: 36.3   Smokeless tobacco: Never  Vaping Use   Vaping status: Never Used  Substance and Sexual Activity   Alcohol use: Yes    Comment: Rare   Drug use: Never   Sexual activity: Not Currently  Other Topics Concern   Not on file  Social History Narrative   Not on file   Social Determinants of Health   Financial Resource Strain: Low Risk  (10/09/2021)   Received from [facility] visits prior to 12/08/2022., [facility] visits prior to 12/08/2022.   Overall Financial Resource Strain (CARDIA)    Difficulty of Paying Living Expenses: Not hard at all  Food Insecurity: No Food Insecurity (05/21/2023)   Hunger Vital Sign    Worried About Running Out of Food in the Last Year: Never true    Ran Out of Food in the Last Year: Never true  Transportation Needs: No Transportation Needs (05/21/2023)   PRAPARE - Administrator, Civil Service (Medical): No    Lack of Transportation (Non-Medical): No  Physical Activity:  Insufficiently Active (10/09/2021)   Received from [facility] visits prior to 12/08/2022., [facility] visits prior to 12/08/2022.   Exercise Vital Sign    Days of Exercise per Week: 2 days    Minutes of Exercise per Session: 40 min  Stress: Stress Concern Present (10/09/2021)   Received from [facility] visits prior to 12/08/2022., [facility] visits prior to 12/08/2022.   [employer] of Occupational Health - Occupational Stress Questionnaire    Feeling of Stress : To some extent  Social Connections: Unknown (02/12/2022)   Received from [facility], [facility]   Social Network    Social Network: Not on file  Intimate Partner Violence: Not At Risk (05/21/2023)   Humiliation, Afraid, Rape, and Kick questionnaire    Fear of Current or Ex-Partner: No    Emotionally Abused: No    Physically Abused: No    Sexually Abused: No    Review of Systems  Respiratory:  Negative for chest tightness and shortness of breath.   Psychiatric/Behavioral:  Negative for sleep disturbance.     Vitals:   06/24/23 1435  BP: 134/70  Pulse: 70  SpO2: 98%     Physical Exam Constitutional:      Appearance: Normal appearance.  HENT:     Head: Normocephalic and atraumatic.     Nose: Nose normal.     Mouth/Throat:     Mouth: Mucous membranes are moist.     Pharynx: Oropharynx is clear.  Eyes:     General: No scleral icterus. Cardiovascular:     Rate and Rhythm: Normal rate and regular rhythm.     Heart sounds: No murmur heard.    No friction rub.  Pulmonary:     Effort: No respiratory distress.     Breath sounds: No stridor. No wheezing or rhonchi.  Musculoskeletal:     Cervical back: No rigidity or tenderness.  Neurological:     Mental Status: He is alert.  Psychiatric:  Mood and Affect: Mood normal.    Data Reviewed: CT chest reviewed during this visit  Most recent CT scan of the chest reviewed  with the patient-10/11/2022  IR visit reviewed  Previous CT was reviewed in detail  Assessment:  Loculated pleural effusion -Did did resolve on a follow-up CT scan of the chest  Pleuritic chest pain, associated shortness of breath Recent prolonged pelvic surgery -Some concern for pulmonary embolism -Concern for pleurisy   Does have an infiltrate right lower lobe that has also improved -Pleural-based density -The improvement in the size of her lesion is encouraging on previous CT  Continues to improve overall however did have an infiltrative process and loculated fluid that will need further follow-up  Plan/Recommendations: Plan will be to review another CT in 6 months -Will order as a CT PE protocol to rule out pulmonary embolism and also evaluate the persistent infiltrate at the right base  Encouraged to call with any significant concerns  Tentative follow-up in 3 months    Virl Diamond MD [facility] 06/24/2023, 2:40 PM  CC: [name], MD

## 2023-06-25 ENCOUNTER — Ambulatory Visit
Admission: RE | Admit: 2023-06-25 | Discharge: 2023-06-25 | Disposition: A | Discharge status: Home / Self Care | Payer: Medicare Other | Source: Ambulatory Visit | Attending: Pulmonary Disease | Admitting: Pulmonary Disease

## 2023-06-25 DIAGNOSIS — R0602 Shortness of breath: Secondary | ICD-10-CM

## 2023-06-25 DIAGNOSIS — I700 Atherosclerosis of aorta: Secondary | ICD-10-CM | POA: Diagnosis not present

## 2023-06-25 DIAGNOSIS — Z932 Ileostomy status: Secondary | ICD-10-CM | POA: Diagnosis not present

## 2023-06-25 MED ORDER — IOPAMIDOL (ISOVUE-370) INJECTION 76%
500.0000 mL | Freq: Once | INTRAVENOUS | Status: AC | PRN
  Administered 2023-06-25: 60 mL via INTRAVENOUS

## 2023-06-26 ENCOUNTER — Telehealth: Payer: Self-pay | Admitting: Pulmonary Disease

## 2023-06-26 NOTE — Telephone Encounter (Signed)
Patient wants to inform provider about CT results being posted

## 2023-06-27 ENCOUNTER — Encounter: Payer: Self-pay | Admitting: Pulmonary Disease

## 2023-06-27 DIAGNOSIS — R9389 Abnormal findings on diagnostic imaging of other specified body structures: Secondary | ICD-10-CM

## 2023-06-28 NOTE — Telephone Encounter (Signed)
Pt also sent a mychart encounter. Closing the phone encounter so we can refer to the [place] encounter.

## 2023-07-03 ENCOUNTER — Telehealth: Payer: Self-pay | Admitting: Pulmonary Disease

## 2023-07-03 NOTE — Telephone Encounter (Signed)
Patient is returning phone call. Patient phone number is [phone].

## 2023-07-03 NOTE — Telephone Encounter (Signed)
PT calling asking that Dr. Wynona Neat contact him to briefly go over the CT findings. His # is [phone]

## 2023-07-03 NOTE — Telephone Encounter (Signed)
Called, left message on voicemail  Will call again tomorrow

## 2023-07-04 NOTE — Telephone Encounter (Signed)
Order placed for CT and of October.  CT findings discussed with patient, will repeat CT to follow-up to resolution

## 2023-07-05 ENCOUNTER — Encounter: Payer: Self-pay | Admitting: Pulmonary Disease

## 2023-07-05 DIAGNOSIS — Z7409 Other reduced mobility: Secondary | ICD-10-CM | POA: Diagnosis not present

## 2023-07-05 DIAGNOSIS — M6281 Muscle weakness (generalized): Secondary | ICD-10-CM | POA: Diagnosis not present

## 2023-07-08 ENCOUNTER — Encounter: Payer: Self-pay | Admitting: Pulmonary Disease

## 2023-07-08 DIAGNOSIS — M25511 Pain in right shoulder: Secondary | ICD-10-CM | POA: Diagnosis not present

## 2023-07-09 DIAGNOSIS — N390 Urinary tract infection, site not specified: Secondary | ICD-10-CM | POA: Diagnosis not present

## 2023-07-09 DIAGNOSIS — Z03818 Encounter for observation for suspected exposure to other biological agents ruled out: Secondary | ICD-10-CM | POA: Diagnosis not present

## 2023-07-09 DIAGNOSIS — R509 Fever, unspecified: Secondary | ICD-10-CM | POA: Diagnosis not present

## 2023-07-15 DIAGNOSIS — Z936 Other artificial openings of urinary tract status: Secondary | ICD-10-CM | POA: Diagnosis not present

## 2023-07-15 DIAGNOSIS — Z932 Ileostomy status: Secondary | ICD-10-CM | POA: Diagnosis not present

## 2023-07-15 DIAGNOSIS — Z8551 Personal history of malignant neoplasm of bladder: Secondary | ICD-10-CM | POA: Diagnosis not present

## 2023-07-18 DIAGNOSIS — Z7409 Other reduced mobility: Secondary | ICD-10-CM | POA: Diagnosis not present

## 2023-07-18 DIAGNOSIS — M6281 Muscle weakness (generalized): Secondary | ICD-10-CM | POA: Diagnosis not present

## 2023-07-19 ENCOUNTER — Ambulatory Visit
Admission: RE | Admit: 2023-07-19 | Discharge: 2023-07-19 | Disposition: A | Discharge status: Home / Self Care | Payer: Medicare Other | Source: Ambulatory Visit | Attending: Pulmonary Disease | Admitting: Pulmonary Disease

## 2023-07-19 DIAGNOSIS — R9389 Abnormal findings on diagnostic imaging of other specified body structures: Secondary | ICD-10-CM

## 2023-07-19 DIAGNOSIS — R531 Weakness: Secondary | ICD-10-CM | POA: Diagnosis not present

## 2023-07-19 DIAGNOSIS — R509 Fever, unspecified: Secondary | ICD-10-CM | POA: Diagnosis not present

## 2023-07-19 DIAGNOSIS — C61 Malignant neoplasm of prostate: Secondary | ICD-10-CM | POA: Diagnosis not present

## 2023-07-19 DIAGNOSIS — R918 Other nonspecific abnormal finding of lung field: Secondary | ICD-10-CM | POA: Diagnosis not present

## 2023-07-22 ENCOUNTER — Inpatient Hospital Stay (HOSPITAL_COMMUNITY)
Admission: EM | Admit: 2023-07-22 | Discharge: 2023-07-25 | DRG: 871 | Disposition: A | Discharge status: Home / Self Care | Payer: Medicare Other | Attending: Internal Medicine | Admitting: Internal Medicine

## 2023-07-22 ENCOUNTER — Emergency Department (HOSPITAL_COMMUNITY): Payer: Medicare Other

## 2023-07-22 ENCOUNTER — Other Ambulatory Visit: Payer: Self-pay

## 2023-07-22 DIAGNOSIS — A419 Sepsis, unspecified organism: Secondary | ICD-10-CM | POA: Diagnosis not present

## 2023-07-22 DIAGNOSIS — N134 Hydroureter: Secondary | ICD-10-CM | POA: Diagnosis not present

## 2023-07-22 DIAGNOSIS — N138 Other obstructive and reflux uropathy: Secondary | ICD-10-CM | POA: Diagnosis present

## 2023-07-22 DIAGNOSIS — Z86011 Personal history of benign neoplasm of the brain: Secondary | ICD-10-CM

## 2023-07-22 DIAGNOSIS — I129 Hypertensive chronic kidney disease with stage 1 through stage 4 chronic kidney disease, or unspecified chronic kidney disease: Secondary | ICD-10-CM | POA: Diagnosis not present

## 2023-07-22 DIAGNOSIS — R000 Tachycardia, unspecified: Secondary | ICD-10-CM | POA: Diagnosis not present

## 2023-07-22 DIAGNOSIS — E8721 Acute metabolic acidosis: Secondary | ICD-10-CM | POA: Diagnosis present

## 2023-07-22 DIAGNOSIS — R739 Hyperglycemia, unspecified: Secondary | ICD-10-CM

## 2023-07-22 DIAGNOSIS — D649 Anemia, unspecified: Secondary | ICD-10-CM

## 2023-07-22 DIAGNOSIS — E785 Hyperlipidemia, unspecified: Secondary | ICD-10-CM | POA: Diagnosis present

## 2023-07-22 DIAGNOSIS — N390 Urinary tract infection, site not specified: Secondary | ICD-10-CM

## 2023-07-22 DIAGNOSIS — K219 Gastro-esophageal reflux disease without esophagitis: Secondary | ICD-10-CM | POA: Diagnosis present

## 2023-07-22 DIAGNOSIS — Z87442 Personal history of urinary calculi: Secondary | ICD-10-CM

## 2023-07-22 DIAGNOSIS — D631 Anemia in chronic kidney disease: Secondary | ICD-10-CM | POA: Diagnosis present

## 2023-07-22 DIAGNOSIS — C61 Malignant neoplasm of prostate: Secondary | ICD-10-CM | POA: Diagnosis not present

## 2023-07-22 DIAGNOSIS — I700 Atherosclerosis of aorta: Secondary | ICD-10-CM | POA: Diagnosis not present

## 2023-07-22 DIAGNOSIS — G40209 Localization-related (focal) (partial) symptomatic epilepsy and epileptic syndromes with complex partial seizures, not intractable, without status epilepticus: Secondary | ICD-10-CM | POA: Diagnosis not present

## 2023-07-22 DIAGNOSIS — R509 Fever, unspecified: Secondary | ICD-10-CM | POA: Diagnosis not present

## 2023-07-22 DIAGNOSIS — N289 Disorder of kidney and ureter, unspecified: Secondary | ICD-10-CM

## 2023-07-22 DIAGNOSIS — N261 Atrophy of kidney (terminal): Secondary | ICD-10-CM | POA: Diagnosis not present

## 2023-07-22 DIAGNOSIS — N401 Enlarged prostate with lower urinary tract symptoms: Secondary | ICD-10-CM | POA: Diagnosis not present

## 2023-07-22 DIAGNOSIS — Z923 Personal history of irradiation: Secondary | ICD-10-CM

## 2023-07-22 DIAGNOSIS — Z1611 Resistance to penicillins: Secondary | ICD-10-CM | POA: Diagnosis not present

## 2023-07-22 DIAGNOSIS — R0989 Other specified symptoms and signs involving the circulatory and respiratory systems: Secondary | ICD-10-CM | POA: Diagnosis not present

## 2023-07-22 DIAGNOSIS — Z8551 Personal history of malignant neoplasm of bladder: Secondary | ICD-10-CM

## 2023-07-22 DIAGNOSIS — A4159 Other Gram-negative sepsis: Secondary | ICD-10-CM | POA: Diagnosis not present

## 2023-07-22 DIAGNOSIS — C679 Malignant neoplasm of bladder, unspecified: Secondary | ICD-10-CM

## 2023-07-22 DIAGNOSIS — Z79899 Other long term (current) drug therapy: Secondary | ICD-10-CM

## 2023-07-22 DIAGNOSIS — Z9079 Acquired absence of other genital organ(s): Secondary | ICD-10-CM

## 2023-07-22 DIAGNOSIS — N136 Pyonephrosis: Secondary | ICD-10-CM | POA: Diagnosis present

## 2023-07-22 DIAGNOSIS — K5900 Constipation, unspecified: Secondary | ICD-10-CM | POA: Diagnosis not present

## 2023-07-22 DIAGNOSIS — Z7951 Long term (current) use of inhaled steroids: Secondary | ICD-10-CM

## 2023-07-22 DIAGNOSIS — Z85828 Personal history of other malignant neoplasm of skin: Secondary | ICD-10-CM | POA: Diagnosis not present

## 2023-07-22 DIAGNOSIS — E43 Unspecified severe protein-calorie malnutrition: Secondary | ICD-10-CM | POA: Diagnosis not present

## 2023-07-22 DIAGNOSIS — Z906 Acquired absence of other parts of urinary tract: Secondary | ICD-10-CM

## 2023-07-22 DIAGNOSIS — Z932 Ileostomy status: Secondary | ICD-10-CM | POA: Diagnosis not present

## 2023-07-22 DIAGNOSIS — N1330 Unspecified hydronephrosis: Secondary | ICD-10-CM | POA: Diagnosis not present

## 2023-07-22 DIAGNOSIS — G4700 Insomnia, unspecified: Secondary | ICD-10-CM | POA: Diagnosis present

## 2023-07-22 DIAGNOSIS — Z8546 Personal history of malignant neoplasm of prostate: Secondary | ICD-10-CM

## 2023-07-22 DIAGNOSIS — Z87891 Personal history of nicotine dependence: Secondary | ICD-10-CM | POA: Diagnosis not present

## 2023-07-22 DIAGNOSIS — Z681 Body mass index (BMI) 19 or less, adult: Secondary | ICD-10-CM | POA: Diagnosis not present

## 2023-07-22 DIAGNOSIS — K5909 Other constipation: Secondary | ICD-10-CM

## 2023-07-22 DIAGNOSIS — Z882 Allergy status to sulfonamides status: Secondary | ICD-10-CM

## 2023-07-22 DIAGNOSIS — K5190 Ulcerative colitis, unspecified, without complications: Secondary | ICD-10-CM | POA: Diagnosis not present

## 2023-07-22 DIAGNOSIS — N132 Hydronephrosis with renal and ureteral calculous obstruction: Secondary | ICD-10-CM | POA: Diagnosis not present

## 2023-07-22 DIAGNOSIS — N1832 Chronic kidney disease, stage 3b: Secondary | ICD-10-CM | POA: Diagnosis not present

## 2023-07-22 DIAGNOSIS — I10 Essential (primary) hypertension: Secondary | ICD-10-CM

## 2023-07-22 LAB — CBC WITH DIFFERENTIAL/PLATELET
Abs Immature Granulocytes: 0.3 10*3/uL — ABNORMAL HIGH (ref 0.00–0.07)
Basophils Absolute: 0.1 10*3/uL (ref 0.0–0.1)
Basophils Relative: 0 %
Eosinophils Absolute: 0 10*3/uL (ref 0.0–0.5)
Eosinophils Relative: 0 %
HCT: 34.1 % — ABNORMAL LOW (ref 39.0–52.0)
Hemoglobin: 11.1 g/dL — ABNORMAL LOW (ref 13.0–17.0)
Immature Granulocytes: 1 %
Lymphocytes Relative: 3 %
Lymphs Abs: 0.9 10*3/uL (ref 0.7–4.0)
MCH: 31.8 pg (ref 26.0–34.0)
MCHC: 32.6 g/dL (ref 30.0–36.0)
MCV: 97.7 fL (ref 80.0–100.0)
Monocytes Absolute: 1.9 10*3/uL — ABNORMAL HIGH (ref 0.1–1.0)
Monocytes Relative: 6 %
Neutro Abs: 27.6 10*3/uL — ABNORMAL HIGH (ref 1.7–7.7)
Neutrophils Relative %: 90 %
Platelets: 152 10*3/uL (ref 150–400)
RBC: 3.49 MIL/uL — ABNORMAL LOW (ref 4.22–5.81)
RDW: 13.5 % (ref 11.5–15.5)
WBC: 30.8 10*3/uL — ABNORMAL HIGH (ref 4.0–10.5)
nRBC: 0 % (ref 0.0–0.2)

## 2023-07-22 LAB — COMPREHENSIVE METABOLIC PANEL
ALT: 15 U/L (ref 0–44)
AST: 32 U/L (ref 15–41)
Albumin: 3.2 g/dL — ABNORMAL LOW (ref 3.5–5.0)
Alkaline Phosphatase: 81 U/L (ref 38–126)
Anion gap: 8 (ref 5–15)
BUN: 43 mg/dL — ABNORMAL HIGH (ref 8–23)
CO2: 16 mmol/L — ABNORMAL LOW (ref 22–32)
Calcium: 8.8 mg/dL — ABNORMAL LOW (ref 8.9–10.3)
Chloride: 111 mmol/L (ref 98–111)
Creatinine, Ser: 2.16 mg/dL — ABNORMAL HIGH (ref 0.61–1.24)
GFR, Estimated: 31 mL/min — ABNORMAL LOW (ref >60)
Glucose, Bld: 138 mg/dL — ABNORMAL HIGH (ref 70–99)
Potassium: 5 mmol/L (ref 3.5–5.1)
Sodium: 135 mmol/L (ref 135–145)
Total Bilirubin: 1.3 mg/dL — ABNORMAL HIGH (ref 0.3–1.2)
Total Protein: 7.6 g/dL (ref 6.5–8.1)

## 2023-07-22 LAB — URINALYSIS, W/ REFLEX TO CULTURE (INFECTION SUSPECTED)
Bilirubin Urine: NEGATIVE
Glucose, UA: NEGATIVE mg/dL
Ketones, ur: NEGATIVE mg/dL
Nitrite: POSITIVE — AB
Protein, ur: 100 mg/dL — AB
RBC / HPF: >50 RBC/hpf (ref 0–5)
Specific Gravity, Urine: 1.013 (ref 1.005–1.030)
WBC, UA: >50 WBC/hpf (ref 0–5)
pH: 5 (ref 5.0–8.0)

## 2023-07-22 LAB — PROTIME-INR
INR: 1.2 (ref 0.8–1.2)
Prothrombin Time: 14.9 s (ref 11.4–15.2)

## 2023-07-22 LAB — I-STAT CG4 LACTIC ACID, ED
Lactic Acid, Venous: 0.9 mmol/L (ref 0.5–1.9)
Lactic Acid, Venous: 1 mmol/L (ref 0.5–1.9)

## 2023-07-22 LAB — APTT: aPTT: 36 s (ref 24–36)

## 2023-07-22 MED ORDER — POLYETHYLENE GLYCOL 3350 17 G PO PACK
17.0000 g | PACK | Freq: Every day | ORAL | Status: DC
  Administered 2023-07-22 – 2023-07-25 (×4): 17 g via ORAL
  Filled 2023-07-22 (×4): qty 1

## 2023-07-22 MED ORDER — LACTATED RINGERS IV BOLUS (SEPSIS)
1000.0000 mL | Freq: Once | INTRAVENOUS | Status: AC
  Administered 2023-07-22: 1000 mL via INTRAVENOUS

## 2023-07-22 MED ORDER — LACTATED RINGERS IV BOLUS (SEPSIS)
500.0000 mL | Freq: Once | INTRAVENOUS | Status: AC
  Administered 2023-07-22: 500 mL via INTRAVENOUS

## 2023-07-22 MED ORDER — SENNOSIDES-DOCUSATE SODIUM 8.6-50 MG PO TABS
1.0000 | ORAL_TABLET | Freq: Two times a day (BID) | ORAL | Status: DC
  Administered 2023-07-22 – 2023-07-25 (×7): 1 via ORAL
  Filled 2023-07-22 (×7): qty 1

## 2023-07-22 MED ORDER — ACETAMINOPHEN 325 MG PO TABS: 650.0000 mg | ORAL_TABLET | Freq: Once | ORAL | Status: DC

## 2023-07-22 MED ORDER — HEPARIN SODIUM (PORCINE) 5000 UNIT/ML IJ SOLN
5000.0000 [IU] | Freq: Three times a day (TID) | INTRAMUSCULAR | Status: DC
  Administered 2023-07-22 – 2023-07-25 (×9): 5000 [IU] via SUBCUTANEOUS
  Filled 2023-07-22 (×9): qty 1

## 2023-07-22 MED ORDER — SODIUM CHLORIDE 0.9 % IV SOLN: 2.0000 g | INTRAVENOUS | Status: DC

## 2023-07-22 MED ORDER — ACETAMINOPHEN 325 MG PO TABS
650.0000 mg | ORAL_TABLET | Freq: Once | ORAL | Status: AC | PRN
  Administered 2023-07-22: 650 mg via ORAL
  Filled 2023-07-22: qty 2

## 2023-07-22 MED ORDER — BOOST / RESOURCE BREEZE PO LIQD CUSTOM
1.0000 | Freq: Three times a day (TID) | ORAL | Status: DC
  Administered 2023-07-22 – 2023-07-25 (×9): 1 via ORAL

## 2023-07-22 MED ORDER — LEVETIRACETAM 500 MG PO TABS
500.0000 mg | ORAL_TABLET | Freq: Three times a day (TID) | ORAL | Status: DC
  Administered 2023-07-22 – 2023-07-25 (×10): 500 mg via ORAL
  Filled 2023-07-22 (×10): qty 1

## 2023-07-22 MED ORDER — LACTATED RINGERS IV SOLN: INTRAVENOUS | Status: DC

## 2023-07-22 MED ORDER — LEVOFLOXACIN IN D5W 250 MG/50ML IV SOLN
250.0000 mg | INTRAVENOUS | Status: DC
  Administered 2023-07-22 – 2023-07-24 (×2): 250 mg via INTRAVENOUS
  Filled 2023-07-22 (×2): qty 50

## 2023-07-22 MED ORDER — SODIUM CHLORIDE 0.9 % IV SOLN
2.0000 g | Freq: Once | INTRAVENOUS | Status: AC
  Administered 2023-07-22: 2 g via INTRAVENOUS
  Filled 2023-07-22: qty 12.5

## 2023-07-22 MED ORDER — METRONIDAZOLE 500 MG/100ML IV SOLN
500.0000 mg | Freq: Once | INTRAVENOUS | Status: AC
  Administered 2023-07-22: 500 mg via INTRAVENOUS
  Filled 2023-07-22: qty 100

## 2023-07-22 MED ORDER — OXCARBAZEPINE ER 600 MG PO TB24
600.0000 mg | ORAL_TABLET | Freq: Every day | ORAL | Status: DC
  Administered 2023-07-22 – 2023-07-24 (×3): 600 mg via ORAL

## 2023-07-22 MED ORDER — VANCOMYCIN HCL IN DEXTROSE 1-5 GM/200ML-% IV SOLN
1000.0000 mg | Freq: Once | INTRAVENOUS | Status: AC
  Administered 2023-07-22: 1000 mg via INTRAVENOUS
  Filled 2023-07-22: qty 200

## 2023-07-22 MED ORDER — LACTATED RINGERS IV BOLUS (SEPSIS): 1000.0000 mL | Freq: Once | INTRAVENOUS | Status: DC

## 2023-07-22 MED ORDER — LACTULOSE 10 GM/15ML PO SOLN
20.0000 g | Freq: Two times a day (BID) | ORAL | Status: DC
  Administered 2023-07-22 – 2023-07-23 (×4): 20 g via ORAL
  Filled 2023-07-22 (×4): qty 30

## 2023-07-22 NOTE — Assessment & Plan Note (Addendum)
-   patient has history of CKD3b. Baseline creat ~ 1.8 - 2, eGFR~ 40 - currently at baseline

## 2023-07-22 NOTE — Discharge Instructions (Signed)

## 2023-07-22 NOTE — Assessment & Plan Note (Signed)
Hold amlodipine in setting of sepsis.

## 2023-07-22 NOTE — H&P (Addendum)
History and Physical    Patient: Wesley [name] ZOX:[id number] DOB: [birth year] DOA: 07/22/2023 DOS: the patient was seen and examined on 07/22/2023 PCP: [name], MD  Patient coming from: Home  Chief Complaint:  Chief Complaint  Patient presents with   Weakness   HPI: Wesley [name] is a 72 y.o. male with medical history significant of CKD 3B, UC, bladder CA s/p cystectomy with ileal conduit in Aug 2024.  Pt in to ED with persistent fever, chills, generalized weakness over past 4 days.  Cloudy urine.  Tm 101.7 at home.  Denies cough, nausea, vomiting.  Pt seen at UC x9 days ago, diagnosed with UTI at that time, got started on 1 antibiotic, then UC called him back, and switched him to Zyvox / linezolid he says (not sure what organism actually grew out of urine that made them switch to LZD).  Despite completing ABx symptoms ongoing / worsening over past couple of days prompting him to come in to ED.  Took Ibuprofen evening 11/13.  Denies cough, SOB, etc.   Review of Systems: As mentioned in the history of present illness. All other systems reviewed and are negative. Past Medical History:  Diagnosis Date   Arthritis    Atypical chest pain    cardiologist-- dr Leeann Must;  chronic occasional,  pressure / discomfort, LOV 07/14/21 (as of 06/08/22)   Bladder calculus    BPH with obstruction/lower urinary tract symptoms    Follows with Dr. Kasandra Knudsen, urologist.   Complex partial seizure [facility])    followed by neurologist--- dr d. 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 Daphane Shepherd at [facility] neurology, LOV w/ FNP on 03/21/22 in Epic ( as of 06/08/22)   Generalized abdominal pain    w/  bloating due to UC  take doxepin   GERD (gastroesophageal reflux disease)    H/O urinary  retention    History of bladder stone    History of hiatal hernia    History of kidney stones    History of prostate cancer 2010   urologist--- dr pace;   dx 2010,  Gleason 4+3, PSA 5.2;   03-04-2009  s/p radiactive prostate seed implants and external beam radiation   History of repair of hiatal hernia    02-11-2017  s/p lap paraesophageal hernia repair w/ nissen fundoplication   History of skin cancer    Hx of resection of meningioma 02/11/2009   benign right temperal 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 Rhetta Mura (GI);  dx 2010;  current treatment stelara q8wks, LOV 06/07/22 (as of 06/08/22)   Wears glasses    Past Surgical History:  Procedure Laterality Date   BOTOX INJECTION N/A 01/29/2023   Procedure: BOTOX INJECTION 100 UNITS;  Surgeon: Noel Christmas, MD;  Location: [facility] [place];  Service: Urology;  Laterality: N/A;   BRAIN  MENINGIOMA EXCISION  02/11/2009   @[facility] ;  craniotomy , right temporal medial sphonoid wing resection   CARPAL TUNNEL RELEASE Bilateral    right 09-05-2016  @[facility] ;   left 2013   COLONOSCOPY  12/2019   COLONOSCOPY  01/2022   CYSTOSCOPY  03/05/2012   Procedure: CYSTOSCOPY;  Surgeon: Antony Haste, MD;  Location: [facility];  Service: Urology;  Laterality: N/A;  Cysto Clot Evacuation and Fulgeration Of [place] ([place])   CYSTOSCOPY WITH INJECTION N/A 05/22/2023   Procedure: CYSTOSCOPY WITH INJECTION OF INDOCYANINE GREEN DYE;  Surgeon: Loletta Parish., MD;  Location: [facility];  Service: Urology;  Laterality: N/A;   CYSTOSCOPY WITH LITHOLAPAXY N/A 03/13/2019   Procedure: CYSTOSCOPY WITH lASER  LITHOLAPAXY, DILATION OF STRICTURE;  Surgeon: Ihor Gully, MD;  Location: [facility] [place];  Service: Urology;  Laterality: N/A;   CYSTOSCOPY WITH LITHOLAPAXY N/A 01/02/2022   Procedure: CYSTOSCOPY WITH LITHOTRIPSY;  Surgeon: Noel Christmas, MD;  Location: [facility];  Service: Urology;  Laterality: N/A;   CYSTOSCOPY WITH RETROGRADE PYELOGRAM, URETEROSCOPY AND STENT PLACEMENT Right 03/13/2022   Procedure: CYSTOSCOPY WITH RETROGRADE PYELOGRAM, URETEROSCOPY AND STENT PLACEMENT;  Surgeon: Noel Christmas, MD;  Location: [facility];  Service: Urology;  Laterality: Right;  75 MINS   CYSTOSCOPY WITH RETROGRADE PYELOGRAM, URETEROSCOPY AND STENT PLACEMENT Right 08/07/2022   Procedure: CYSTOSCOPY WITH RETROGRADE PYELOGRAM, URETEROSCOPY AND STENT PLACEMENT, REMOVAL OF NEPHROURETERAL STENT;  Surgeon: Noel Christmas, MD;  Location: [facility];  Service: Urology;  Laterality: Right;  75 MINS   CYSTOSCOPY WITH URETHRAL DILATATION N/A 08/23/2020   Procedure: CYSTOSCOPY WITH URETHRAL DILATATION, BLADDER BIOPSY, FULGERATION;  Surgeon: Noel Christmas, MD;  Location: [facility] [place];  Service: Urology;  Laterality: N/A;  1 HR   CYSTOSCOPY WITH URETHRAL DILATATION N/A 03/13/2021   Procedure: CYSTOSCOPY WITH URETHRAL BALLOON DILATATION/ FOLEY CATHETER PLACEMENT;  Surgeon: Noel Christmas, MD;  [facility];  Service: Urology;  Laterality: N/A;   CYSTOSCOPY WITH URETHRAL DILATATION Left 07/08/2009   @[facility] :   with left  RTG   CYSTOSCOPY WITH URETHRAL DILATATION N/A 06/19/2022   Procedure: CYSTOSCOPY WITH OPTILUME URETHRAL DILATATION;  Surgeon: Noel Christmas, MD;  Location: [facility] [place];  Service: Urology;  Laterality: N/A;   CYSTOSCOPY/URETEROSCOPY/HOLMIUM LASER/STENT PLACEMENT Bilateral 01/29/2023   Procedure: DIAGNOSTIC CYSTOSCOPY , LEFT RETROGRADE PYELOGRAM, LEFT URETERAL STENT PLACEMENT, AND STONE BASKETING;  Surgeon: Noel Christmas, MD;  Location: [facility] [place];  Service: Urology;  Laterality: Bilateral;   HOLMIUM LASER APPLICATION N/A 08/23/2020   Procedure: HOLMIUM LASER APPLICATION OF URETHRAL STONE;  Surgeon: Noel Christmas, MD;  Location: [facility];  Service: Urology;  Laterality: N/A;   HOLMIUM LASER APPLICATION Right 03/13/2022   Procedure: HOLMIUM LASER APPLICATION;  Surgeon: Noel Christmas, MD;  Location: [facility];  Service: Urology;  Laterality: Right;   HOLMIUM LASER APPLICATION Right 08/07/2022   Procedure: HOLMIUM LASER APPLICATION;  Surgeon: Noel Christmas, MD;  [facility];  Service: Urology;  Laterality: Right;   INGUINAL HERNIA REPAIR Right 05/03/2015   @[facility]    INSERTION PROSTATE RADIATION SEED  03/04/2009   @[facility]    IR NEPHROSTOMY PLACEMENT RIGHT  07/18/2022   IR URETERAL STENT RIGHT NEW ACCESS W/O SEP NEPHROSTOMY CATH  02/04/2023   KNEE CARTILAGE SURGERY Left    1980S   LAPAROSCOPIC PARAESOPHAGEAL HERNIA REPAIR  02/11/2017   @[facility] ;  W/  NISSEN FUNDOPLICATION (MESH)  LYMPH NODE DISSECTION Bilateral 05/22/2023   Procedure: LYMPH NODE DISSECTION;  Surgeon: Loletta Parish., MD;  Location: [facility];  Service: Urology;  Laterality: Bilateral;   NEPHROLITHOTOMY Right 03/29/2023   Procedure: NEPHROLITHOTOMY PERCUTANEOUS;  Surgeon: Loletta Parish., MD;  Location[facility];  Service: Urology;  Laterality: Right;  2 HRS   PROSTATE BIOPSY     ROBOT ASSISTED LAPAROSCOPIC COMPLETE CYSTECT ILEAL CONDUIT N/A 05/22/2023   Procedure: XI ROBOTIC ASSISTED LAPAROSCOPIC COMPLETE CYSTECT ILEAL CONDUIT;  Surgeon: Loletta Parish., MD;  Location[facility];  Service: Urology;  Laterality: N/A;   ROBOT ASSISTED LAPAROSCOPIC RADICAL PROSTATECTOMY N/A 05/22/2023   Procedure: XI ROBOTIC ASSISTED LAPAROSCOPIC RADICAL PROSTATECTOMY;  Surgeon: Loletta Parish., MD;  Location: [facility];  Service: Urology;  Laterality: N/A;   TOE SURGERY     TRANSURETHRAL  RESECTION OF BLADDER TUMOR N/A 01/02/2022   Procedure: TRANSURETHRAL RESECTION OF BLADDER TUMOR (TURBT);  Surgeon: Noel Christmas, MD;  Location: [facility];  Service: Urology;  Laterality: N/A;   TRANSURETHRAL RESECTION OF BLADDER TUMOR N/A 01/29/2023   Procedure: TRANSURETHRAL RESECTION OF BLADDER TUMOR (TURBT);  Surgeon: Noel Christmas, MD;  Location: [facility];  Service: Urology;  Laterality: N/A;   Social History:  reports that he quit smoking about 36 years ago. His smoking use included cigarettes. He started smoking about 46 years ago. He has a 10 pack-year smoking history. He has never used smokeless tobacco. He reports current alcohol use. He reports that he does not use drugs.  Allergies  Allergen Reactions   Sulfa Antibiotics Rash    Childhood    No family history on file.  Prior to Admission medications   Medication Sig Start Date End Date Taking? Authorizing Provider  amLODipine (NORVASC) 5 MG tablet Take 5 mg by mouth at bedtime.    [provider]  Biotin (BIOTIN 5000) 5 MG CAPS Take 5 mg by mouth daily.    [provider]  BREO ELLIPTA 100-25 MCG/ACT AEPB INHALE 1 PUFF BY MOUTH EVERY DAY 06/25/23   Virl Diamond A, MD  celecoxib (CELEBREX) 100 MG capsule Take 100 mg by mouth every evening.    [provider]  doxepin (SINEQUAN) 25 MG capsule Take 25 mg by mouth at bedtime.    [provider]  ferrous sulfate 325 (65 FE) MG tablet Take 325 mg by mouth daily.    [provider]  ketoconazole (NIZORAL) 2 % cream Apply 1 Application topically daily as needed for irritation.    [provider]  ketoconazole (NIZORAL) 2 % shampoo Apply 1 Application topically daily as needed (yeast).    [provider]  levETIRAcetam (KEPPRA) 500 MG tablet Take 500 mg by mouth 3 (three) times daily.    [provider]  OXcarbazepine ER (OXTELLAR XR) 600 MG TB24 Take 600 mg by mouth daily  in the afternoon.    [provider]  primidone (MYSOLINE) 50 MG tablet Take 100 mg by mouth at bedtime.    [provider]  ustekinumab (STELARA) 90 MG/ML SOSY injection Inject 90 mg into the skin every 8 (eight) weeks.    [provider]    Physical Exam: Vitals:   07/22/23 0336 07/22/23 0344  BP: (!) 155/77   Pulse: (!) 120   Resp: 18 (!) 23  Temp: (!) 100.7 F (38.2 C)   TempSrc: Oral   SpO2: 99%   Weight: 45.4 kg  Height: 5\' 4"  (1.626 m)    Constitutional: NAD, calm, comfortable, thin Respiratory: clear to auscultation bilaterally, no wheezing, no crackles. Normal respiratory effort. No accessory muscle use.  Cardiovascular: Tachycardic Abdomen: Ileal conduit in RLQ Neurologic: CN 2-12 grossly intact. Sensation intact, DTR normal. Strength 5/5 in all 4.  Psychiatric: Normal judgment and insight. Alert and oriented x 3. Normal mood.   Data Reviewed:    Labs on Admission: I have personally reviewed following labs and imaging studies  CBC: Recent Labs  Lab 07/22/23 0408  WBC 30.8*  NEUTROABS 27.6*  HGB 11.1*  HCT 34.1*  MCV 97.7  PLT 152   Basic Metabolic Panel: Recent Labs  Lab 07/22/23 0408  NA 135  K 5.0  CL 111  CO2 16*  GLUCOSE 138*  BUN 43*  CREATININE 2.16*  CALCIUM 8.8*   GFR: Estimated Creatinine Clearance: 19.3 mL/min (A) (by C-G formula based on SCr of 2.16 mg/dL (H)). Liver Function Tests: Recent Labs  Lab 07/22/23 0408  AST 32  ALT 15  ALKPHOS 81  BILITOT 1.3*  PROT 7.6  ALBUMIN 3.2*   No results for input(s): "LIPASE", "AMYLASE" in the last 168 hours. No results for input(s): "AMMONIA" in the last 168 hours. Coagulation Profile: Recent Labs  Lab 07/22/23 0408  INR 1.2   Cardiac Enzymes: No results for input(s): "CKTOTAL", "CKMB", "CKMBINDEX", "TROPONINI" in the last 168 hours. BNP (last 3 results) No results for input(s): "PROBNP" in the last 8760 hours. HbA1C: No results for input(s): "HGBA1C"  in the last 72 hours. CBG: No results for input(s): "GLUCAP" in the last 168 hours. Lipid Profile: No results for input(s): "CHOL", "HDL", "LDLCALC", "TRIG", "CHOLHDL", "LDLDIRECT" in the last 72 hours. Thyroid Function Tests: No results for input(s): "TSH", "T4TOTAL", "FREET4", "T3FREE", "THYROIDAB" in the last 72 hours. Anemia Panel: No results for input(s): "VITAMINB12", "FOLATE", "FERRITIN", "TIBC", "IRON", "RETICCTPCT" in the last 72 hours. Urine analysis:    Component Value Date/Time   COLORURINE YELLOW 05/28/2023 1235   APPEARANCEUR CLOUDY (A) 05/28/2023 1235   LABSPEC 1.013 05/28/2023 1235   PHURINE 5.0 05/28/2023 1235   GLUCOSEU NEGATIVE 05/28/2023 1235   HGBUR MODERATE (A) 05/28/2023 1235   BILIRUBINUR NEGATIVE 05/28/2023 1235   KETONESUR NEGATIVE 05/28/2023 1235   PROTEINUR 100 (A) 05/28/2023 1235   UROBILINOGEN 1.0 03/01/2012 1629   NITRITE NEGATIVE 05/28/2023 1235   LEUKOCYTESUR LARGE (A) 05/28/2023 1235    Radiological Exams on Admission: No results found.  EKG: Independently reviewed.   Assessment and Plan: * Sepsis (HCC) Suspect urinary source based on history provided.  Lack of other source. Sepsis pathway IVF bolus in ED and maint UA pending UCx pending BCx pending Started on cefepime, flagyl, vanc by EDP Will just leave on cefepime for the moment given suspicion of UTI. Question of a Gram positive UTI organism on HPI?  Or something in culture that made the UC switch him to Zyvox anyhow. Got dose of vanc here in ED, will defer to day team if they want to continue this given that it seems there might have been a gram positive organism on culture from UC? Unfortunately [facility] (he's pretty sure that's the name of the UC he went to) wont open up for another hour (0800), and isnt visible on care-everywhere, so can't call and get culture results until then (Signed this out to oncoming provider). CT renal stone study pending (see  discussion below).  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 Looks like they had B ureteral stents in post op as of CT scan in Sept.  These stents are no longer present as of CT scan x3 days ago CT scan unfortunately doesn't appear to have actually been read by radiology at this time. I question recurrence of L sided hydronephrosis on that CT from 3 days ago. Getting CT renal stone protocol Very likely will need consult after this comes back if it shows any sort of complication (ie does he need ureteral stent(s) again?) Update: indeed CT renal stone study just came back confirming the L sided hydro. EDP has consult to urology in and awaiting call back.  HTN (hypertension) Hold amlodipine in setting of sepsis.  Complex partial seizures (HCC) Cont Keppra Cont oxcarbazepine  CKD stage 3b, GFR 30-44 ml/min (HCC) Creat 2.1 seems consistent with his recent baseline. Probably shouldn't be taking Ibuprofen / NSAIDs though (sounds like he took ibuprofen for fever 11/13)      Advance Care Planning:   Code Status: Full Code  Consults: EDP calling urology for consult  Family Communication: No family in room  Severity of Illness: The appropriate patient status for this patient is INPATIENT. Inpatient status is judged to be reasonable and necessary in order to provide the required intensity of service to ensure the patient's safety. The patient's presenting symptoms, physical exam findings, and initial radiographic and laboratory data in the context of their chronic comorbidities is felt to place them at high risk for further clinical deterioration. Furthermore, it is not anticipated that the patient will be medically stable for discharge from the hospital within 2 midnights of admission.   * I certify that at the point of admission it is my clinical judgment that the patient will require inpatient hospital care spanning beyond 2 midnights  from the point of admission due to high intensity of service, high risk for further deterioration and high frequency of surveillance required.*  Author: Hillary Bow., DO 07/22/2023 6:14 AM  For on call review [email or web address].

## 2023-07-22 NOTE — ED Notes (Signed)
ED TO INPATIENT HANDOFF REPORT  ED Nurse Name and Phone #: Arnetha Gula Name/Age/Gender Wesley [name] 72 y.o. male Room/Bed: WA10/WA10  Code Status   Code Status: Full Code  Home/SNF/Other Home Patient oriented to: self, place, time, and situation Is this baseline? Yes   Triage Complete: Triage complete  Chief Complaint Sepsis [facility]) [A41.9]  Triage Note Pt arrives via [facility] following concerns for ongoing UTI. Reported feeling chills and fevers. Was seen at Urgent Care approx 9 days ago was dx UTI had complete abx. Sts concern today for ongoing symptoms that have worsened to where pt feel weak.  Per note pt hx extensive urology hx with stent and currently urostomy bag. Took ibuprofen at 8-9 pm yesterday 10/13. Denies abdominal pain / back pain.    Allergies Allergies  Allergen Reactions   Sulfa Antibiotics Rash    Childhood    Level of Care/Admitting Diagnosis ED Disposition     ED Disposition  Admit   Condition  --   Comment  Hospital Area: [facility] [[id number]]  Level of Care: Stepdown [14]  Admit to SDU based on following criteria: Severe physiological/psychological symptoms:  Any diagnosis requiring assessment & intervention at least every 4 hours on an ongoing basis to obtain desired patient outcomes including stability and rehabilitation  Admit to SDU based on following criteria: Hemodynamic compromise or significant risk of instability:  Patient requiring short term acute titration and management of vasoactive drips, and invasive monitoring (i.e., CVP and Arterial line).  May admit patient to [facility] or [facility] if equivalent level of care is available:: No  Covid Evaluation: Asymptomatic - no recent exposure (last 10 days) testing not required  Diagnosis: Sepsis [facility]) [[id number]]  Admitting Physician: Hillary Bow [[id number]]  Attending Physician: Hillary Bow [phone]  Certification:: I certify this patient will need inpatient  services for at least 2 midnights  Expected Medical Readiness: 07/26/2023          B Medical/Surgery History Past Medical History:  Diagnosis Date   Arthritis    Atypical chest pain    cardiologist-- dr Leeann Must;  chronic occasional,  pressure / discomfort, LOV 07/14/21 (as of 06/08/22)   Bladder calculus    BPH with obstruction/lower urinary tract symptoms    Follows with Dr. Kasandra Knudsen, urologist.   Complex partial seizure [facility])    followed by neurologist--- dr d. 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 Daphane Shepherd at [facility] neurology, LOV w/ FNP on 03/21/22 in Epic ( as of 06/08/22)   Generalized abdominal pain    w/  bloating due to UC  take doxepin   GERD (gastroesophageal reflux disease)    H/O urinary retention    History of bladder stone    History of hiatal hernia    History of kidney stones    History of prostate cancer 2010   urologist--- dr pace;   dx 2010,  Gleason 4+3, PSA 5.2;   03-04-2009  s/p radiactive prostate seed implants and external beam radiation   History of repair of hiatal hernia    02-11-2017  s/p lap paraesophageal hernia repair w/ nissen fundoplication   History of skin cancer    Hx of resection of meningioma 02/11/2009   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 Rhetta Mura (GI);  dx 2010;  current treatment stelara q8wks, LOV 06/07/22 (as of 06/08/22)   Wears glasses    Past Surgical History:   Procedure Laterality Date   BOTOX INJECTION N/A 01/29/2023   Procedure: BOTOX INJECTION 100 UNITS;  Surgeon: Noel Christmas, MD;  Location: [facility];  Service: Urology;  Laterality: N/A;   BRAIN MENINGIOMA EXCISION  02/11/2009   @[facility] ;  craniotomy , right temporal medial sphonoid wing resection   CARPAL TUNNEL RELEASE Bilateral    right 09-05-2016  @[facility] ;   left 2013   COLONOSCOPY  12/2019   COLONOSCOPY  01/2022   CYSTOSCOPY  03/05/2012   Procedure: CYSTOSCOPY;  Surgeon: Antony Haste, MD;  Location: [facility];  Service: Urology;  Laterality: N/A;  Cysto Clot Evacuation and Fulgeration Of [place] ([place])   CYSTOSCOPY WITH INJECTION N/A 05/22/2023   Procedure: CYSTOSCOPY WITH INJECTION OF INDOCYANINE GREEN DYE;  Surgeon: Loletta Parish., MD;  [facility];  Service: Urology;  Laterality: N/A;   CYSTOSCOPY WITH LITHOLAPAXY N/A 03/13/2019   Procedure: CYSTOSCOPY WITH lASER LITHOLAPAXY, DILATION OF STRICTURE;  Surgeon: Ihor Gully, MD;  Location: [facility];  Service: Urology;  Laterality: N/A;   CYSTOSCOPY WITH LITHOLAPAXY N/A 01/02/2022   Procedure: CYSTOSCOPY WITH LITHOTRIPSY;  Surgeon: Noel Christmas, MD;  Location: [facility];  Service: Urology;  Laterality: N/A;   CYSTOSCOPY WITH RETROGRADE PYELOGRAM, URETEROSCOPY AND STENT PLACEMENT Right 03/13/2022   Procedure: CYSTOSCOPY WITH RETROGRADE PYELOGRAM, URETEROSCOPY AND STENT PLACEMENT;  Surgeon: Noel Christmas, MD;  Location: [facility];  Service: Urology;  Laterality: Right;  75 MINS   CYSTOSCOPY WITH RETROGRADE PYELOGRAM, URETEROSCOPY AND STENT PLACEMENT Right 08/07/2022   Procedure: CYSTOSCOPY WITH RETROGRADE PYELOGRAM, URETEROSCOPY AND STENT PLACEMENT, REMOVAL OF NEPHROURETERAL STENT;  Surgeon: Noel Christmas, MD;  [facility];  Service: Urology;  Laterality: Right;  75 MINS   CYSTOSCOPY WITH URETHRAL DILATATION N/A 08/23/2020    Procedure: CYSTOSCOPY WITH URETHRAL DILATATION, BLADDER BIOPSY, FULGERATION;  Surgeon: Noel Christmas, MD;  Location: [facility];  Service: Urology;  Laterality: N/A;  1 HR   CYSTOSCOPY WITH URETHRAL DILATATION N/A 03/13/2021   Procedure: CYSTOSCOPY WITH URETHRAL BALLOON DILATATION/ FOLEY CATHETER PLACEMENT;  Surgeon: Noel Christmas, MD;  [facility];  Service: Urology;  Laterality: N/A;   CYSTOSCOPY WITH URETHRAL DILATATION Left 07/08/2009   @[facility] :   with left  RTG   CYSTOSCOPY WITH URETHRAL DILATATION N/A 06/19/2022   Procedure: CYSTOSCOPY WITH OPTILUME URETHRAL DILATATION;  Surgeon: Noel Christmas, MD;  Location: [facility];  Service: Urology;  Laterality: N/A;   CYSTOSCOPY/URETEROSCOPY/HOLMIUM LASER/STENT PLACEMENT Bilateral 01/29/2023   Procedure: DIAGNOSTIC CYSTOSCOPY , LEFT RETROGRADE PYELOGRAM, LEFT URETERAL STENT PLACEMENT, AND STONE BASKETING;  Surgeon: Noel Christmas, MD;  Location[facility];  Service: Urology;  Laterality: Bilateral;   HOLMIUM LASER APPLICATION N/A 08/23/2020   Procedure: HOLMIUM LASER APPLICATION OF URETHRAL STONE;  Surgeon: Noel Christmas, MD;  Location:  [facility];  Service: Urology;  Laterality: N/A;   HOLMIUM LASER APPLICATION Right 03/13/2022   Procedure: HOLMIUM LASER APPLICATION;  Surgeon: Noel Christmas, MD;  Location: [facility];  Service: Urology;  Laterality: Right;   HOLMIUM LASER APPLICATION Right 08/07/2022   Procedure: HOLMIUM LASER APPLICATION;  Surgeon: Noel Christmas, MD;  [facility];  Service: Urology;  Laterality: Right;   INGUINAL HERNIA REPAIR Right 05/03/2015   @[facility]    INSERTION PROSTATE RADIATION SEED  03/04/2009   @[facility]    IR NEPHROSTOMY PLACEMENT RIGHT  07/18/2022   IR URETERAL STENT RIGHT NEW ACCESS W/O SEP NEPHROSTOMY CATH  02/04/2023   KNEE CARTILAGE SURGERY Left    1980S   LAPAROSCOPIC PARAESOPHAGEAL HERNIA REPAIR  02/11/2017    @[facility] ;  W/  NISSEN FUNDOPLICATION (MESH)   LYMPH NODE DISSECTION Bilateral 05/22/2023   Procedure: LYMPH NODE DISSECTION;  Surgeon: Loletta Parish., MD;  [facility];  Service: Urology;  Laterality: Bilateral;   NEPHROLITHOTOMY Right 03/29/2023   Procedure: NEPHROLITHOTOMY PERCUTANEOUS;  Surgeon: Loletta Parish., MD;  [facility];  Service: Urology;  Laterality: Right;  2 HRS   PROSTATE BIOPSY     ROBOT ASSISTED LAPAROSCOPIC COMPLETE CYSTECT ILEAL CONDUIT N/A 05/22/2023   Procedure: XI ROBOTIC ASSISTED LAPAROSCOPIC COMPLETE CYSTECT ILEAL CONDUIT;  Surgeon: Loletta Parish., MD;  [facility];  Service: Urology;  Laterality: N/A;   ROBOT ASSISTED LAPAROSCOPIC RADICAL PROSTATECTOMY N/A 05/22/2023   Procedure: XI ROBOTIC ASSISTED LAPAROSCOPIC RADICAL PROSTATECTOMY;  Surgeon: Loletta Parish., MD;  [facility];  Service: Urology;  Laterality: N/A;   TOE SURGERY     TRANSURETHRAL RESECTION OF BLADDER TUMOR N/A 01/02/2022   Procedure: TRANSURETHRAL RESECTION OF BLADDER TUMOR (TURBT);  Surgeon: Noel Christmas, MD;  Location: [facility];  Service: Urology;  Laterality: N/A;   TRANSURETHRAL RESECTION OF BLADDER TUMOR N/A 01/29/2023   Procedure: TRANSURETHRAL RESECTION OF BLADDER TUMOR (TURBT);  Surgeon: Noel Christmas, MD;  Location: [facility];  Service: Urology;  Laterality: N/A;     A IV Location/Drains/Wounds Patient Lines/Drains/Airways Status     Active Line/Drains/Airways     Name Placement date Placement time Site Days   Peripheral IV 07/22/23 20 G Right Hand 07/22/23  0411  Hand  less than 1   Closed System Drain 1 Left LLQ Bulb (JP) 05/22/23  1446  LLQ  61   Nephrostomy Right 10 Fr. 03/29/23  1748  Right  115   Urostomy Ileal conduit RUQ 05/22/23  1618  RUQ  61   Ureteral Drain/Stent Right ureter 7.2 Fr. 05/22/23  1544  Right ureter  61   Ureteral Drain/Stent Left ureter 7.2 Fr. 05/22/23  1604  Left ureter  61    Incision - 3 Ports Abdomen 1: Right;Lateral;Lower 2: Right;Medial;Lower 3: Left;Lateral;Lower 05/22/23  1600  -- 61            Intake/Output Last 24 hours No intake or output data in the 24 hours ending 07/22/23 0848  Labs/Imaging Results for orders placed or performed during the hospital encounter of 07/22/23 (from the past 48 hour(s))  Comprehensive metabolic panel     Status: Abnormal   Collection Time: 07/22/23  4:08 AM  Result Value Ref Range   Sodium 135 135 - 145 mmol/L   Potassium 5.0 3.5 - 5.1 mmol/L    Comment: HEMOLYSIS AT THIS LEVEL MAY AFFECT RESULT   Chloride 111 98 -  111 mmol/L   CO2 16 (L) 22 - 32 mmol/L   Glucose, Bld 138 (H) 70 - 99 mg/dL    Comment: Glucose reference range applies only to samples taken after fasting for at least 8 hours.   BUN 43 (H) 8 - 23 mg/dL   Creatinine, Ser 8.65 (H) 0.61 - 1.24 mg/dL   Calcium 8.8 (L) 8.9 - 10.3 mg/dL   Total Protein 7.6 6.5 - 8.1 g/dL   Albumin 3.2 (L) 3.5 - 5.0 g/dL   AST 32 15 - 41 U/L    Comment: HEMOLYSIS AT THIS LEVEL MAY AFFECT RESULT   ALT 15 0 - 44 U/L    Comment: HEMOLYSIS AT THIS LEVEL MAY AFFECT RESULT   Alkaline Phosphatase 81 38 - 126 U/L   Total Bilirubin 1.3 (H) 0.3 - 1.2 mg/dL    Comment: HEMOLYSIS AT THIS LEVEL MAY AFFECT RESULT   GFR, Estimated 31 (L) >60 mL/min    Comment: (NOTE) Calculated using the CKD-EPI Creatinine Equation (2021)    Anion gap 8 5 - 15    Comment: Performed at [facility], [address], [place], [place] [address]  CBC with Differential     Status: Abnormal   Collection Time: 07/22/23  4:08 AM  Result Value Ref Range   WBC 30.8 (H) 4.0 - 10.5 K/uL   RBC 3.49 (L) 4.22 - 5.81 MIL/uL   Hemoglobin 11.1 (L) 13.0 - 17.0 g/dL   HCT 62.9 (L) 52.8 - 41.3 %   MCV 97.7 80.0 - 100.0 fL   MCH 31.8 26.0 - 34.0 pg   MCHC 32.6 30.0 - 36.0 g/dL   RDW 24.4 01.0 - 27.2 %   Platelets 152 150 - 400 K/uL   nRBC 0.0 0.0 - 0.2 %   Neutrophils Relative % 90 %    Neutro Abs 27.6 (H) 1.7 - 7.7 K/uL   Lymphocytes Relative 3 %   Lymphs Abs 0.9 0.7 - 4.0 K/uL   Monocytes Relative 6 %   Monocytes Absolute 1.9 (H) 0.1 - 1.0 K/uL   Eosinophils Relative 0 %   Eosinophils Absolute 0.0 0.0 - 0.5 K/uL   Basophils Relative 0 %   Basophils Absolute 0.1 0.0 - 0.1 K/uL   WBC Morphology Mild Left Shift (1-5% metas, occ myelo)    Immature Granulocytes 1 %   Abs Immature Granulocytes 0.30 (H) 0.00 - 0.07 K/uL    Comment: Performed at [facility], [address] [place], [place] [address]  Protime-INR     Status: None   Collection Time: 07/22/23  4:08 AM  Result Value Ref Range   Prothrombin Time 14.9 11.4 - 15.2 seconds   INR 1.2 0.8 - 1.2    Comment: (NOTE) INR goal varies based on device and disease states. Performed at [facility], [address] [place], [place] [address]   I-Stat Lactic Acid, ED     Status: None   Collection Time: 07/22/23  4:18 AM  Result Value Ref Range   Lactic Acid, Venous 0.9 0.5 - 1.9 mmol/L  APTT     Status: None   Collection Time: 07/22/23  4:36 AM  Result Value Ref Range   aPTT 36 24 - 36 seconds    Comment: Performed at [facility], [address], [place], [place] [address]  I-Stat Lactic Acid, ED     Status: None   Collection Time: 07/22/23  6:56 AM  Result Value Ref  Range   Lactic Acid, Venous 1.0 0.5 - 1.9 mmol/L  Urinalysis, w/ Reflex to Culture (Infection Suspected) -Urine, Clean Catch     Status: Abnormal   Collection Time: 07/22/23  7:38 AM  Result Value Ref Range   Specimen Source URINE, CLEAN CATCH    Color, Urine YELLOW YELLOW   APPearance TURBID (A) CLEAR   Specific Gravity, Urine 1.013 1.005 - 1.030   pH 5.0 5.0 - 8.0   Glucose, UA NEGATIVE NEGATIVE mg/dL   Hgb urine dipstick MODERATE (A) NEGATIVE   Bilirubin Urine NEGATIVE NEGATIVE   Ketones, ur NEGATIVE NEGATIVE mg/dL   Protein, ur 161 (A) NEGATIVE mg/dL   Nitrite POSITIVE (A) NEGATIVE    Leukocytes,Ua LARGE (A) NEGATIVE   RBC / HPF >50 0 - 5 RBC/hpf   WBC, UA >50 0 - 5 WBC/hpf    Comment:        Reflex urine culture not performed if WBC <=10, OR if Squamous epithelial cells >5. If Squamous epithelial cells >5 suggest recollection.    Bacteria, UA MANY (A) NONE SEEN   Squamous Epithelial / HPF 0-5 0 - 5 /HPF   WBC Clumps PRESENT    Mucus PRESENT     Comment: Performed at [facility], [address], [place], [place] [address]   CT Renal Stone Study  Result Date: 07/22/2023 CLINICAL DATA:  72 year old male with possible sepsis. UTI, fevers and chills. Weakness. Prostate cancer. EXAM: CT ABDOMEN AND PELVIS WITHOUT CONTRAST TECHNIQUE: Multidetector CT imaging of the abdomen and pelvis was performed following the standard protocol without IV contrast. RADIATION DOSE REDUCTION: This exam was performed according to the departmental dose-optimization program which includes automated exposure control, adjustment of the mA and/or kV according to patient size and/or use of iterative reconstruction technique. COMPARISON:  Recent outpatient Chest CT 07/19/2023 reported separately. Previous noncontrast CT Abdomen and Pelvis 01/11/2023. FINDINGS: Lower chest: No change from the chest CT 07/19/2023 (please see that report) Hepatobiliary: Larger gallbladder but otherwise negative noncontrast liver and gallbladder. No pericholecystic inflammation identified. Pancreas: Negative. Spleen: Negative. Adrenals/Urinary Tract: Negative adrenal glands. Right renal atrophy and nephrolithiasis. No right hydronephrosis or hydroureter following removal of ureteral stents demonstrated on chest CTA last month. Exophytic and simple fluid density left upper pole renal cyst is stable and appears benign (no follow-up imaging recommended). Left renal hydronephrosis and hydroureter are progressed following ureteral stent removal when compared to 06/25/2023. The proximal left ureter is dilated in  the abdomen (series 2, image 44) but is difficult to delineate distally. And urinary bladder now is surgically absent with multiple pelvic surgical clips and right abdominal ileostomy. The ileostomy loop is nondilated. But small bowel detail is limited due to lack of contrast and visceral fat. Stomach/Bowel: Abundant retained stool in redundant large bowel. New right abdominal ileostomy, probably an ileal conduit. No free air or free fluid identified. No dilated small bowel. Vascular/Lymphatic: Vascular patency is not evaluated in the absence of IV contrast. Normal caliber abdominal aorta. Aortoiliac calcified atherosclerosis. No lymphadenopathy identified in the absence of contrast. Reproductive: Stable prostate brachytherapy. Other: No pelvis free fluid. Musculoskeletal: Stable visualized osseous structures. Widespread spine degeneration. Advanced hip degeneration, severe on the left. No acute or suspicious osseous finding identified. IMPRESSION: 1. Status post cystectomy and right abdominal ileal conduit. New/increased and Moderate Left hydronephrosis and hydroureter following removal of ureteral stents which were visible on CTA Chest 06/25/2023. Stable right renal atrophy and nephrolithiasis. 2. No dilated small bowel, and ileostomy loop  also appears decompressed. Abundant retained stool in redundant large bowel. 3. Abnormal lung bases, see details on Chest CT 07/19/2023 reported this morning. 4. No other acute or inflammatory process in the noncontrast abdomen or pelvis. Aortic Atherosclerosis (ICD10-I70.0). Electronically Signed   By: Odessa Fleming M.D.   On: 07/22/2023 06:46   DG Chest Port 1 View  Result Date: 07/22/2023 CLINICAL DATA:  72 year old male with possible sepsis. UTI, fevers and chills. EXAM: PORTABLE CHEST 1 VIEW COMPARISON:  Portable chest 05/28/2023 and earlier. FINDINGS: Portable AP semi upright view at 0444 hours. Mildly lower lung volumes. Allowing for portable technique the lungs are clear.  Calcified aortic atherosclerosis. Normal cardiac size and mediastinal contours. Visualized tracheal air column is within normal limits. No pneumothorax or pleural effusion. Negative visible bowel gas. No acute osseous abnormality identified. IMPRESSION: No acute cardiopulmonary abnormality. Electronically Signed   By: Odessa Fleming M.D.   On: 07/22/2023 06:20    Pending Labs Unresulted Labs (From admission, onward)     Start     Ordered   07/23/23 0500  Protime-INR  Tomorrow morning,   R        07/22/23 0610   07/23/23 0500  Cortisol-am, blood  Tomorrow morning,   R        07/22/23 0610   07/23/23 0500  Procalcitonin  Tomorrow morning,   R       References:    Procalcitonin Lower Respiratory Tract Infection AND Sepsis Procalcitonin Algorithm   07/22/23 0610   07/23/23 0500  CBC  Tomorrow morning,   R        07/22/23 0610   07/23/23 0500  Basic metabolic panel  Tomorrow morning,   R        07/22/23 0610   07/22/23 0738  Urine Culture  Once,   R        07/22/23 0738   07/22/23 0436  Blood Culture (routine x 2)  (Undifferentiated presentation (screening labs and basic nursing orders))  BLOOD CULTURE X 2,   STAT      07/22/23 0436            Vitals/Pain Today's Vitals   07/22/23 0344 07/22/23 0530 07/22/23 0754 07/22/23 0815  BP:    112/62  Pulse:    78  Resp: (!) 23   18  Temp:   97.9 F (36.6 C) 97.9 F (36.6 C)  TempSrc:   Oral Oral  SpO2:    98%  Weight:      Height:      PainSc:  0-No pain      Isolation Precautions No active isolations  Medications Medications  lactated ringers infusion ( Intravenous New Bag/Given 07/22/23 0515)  levETIRAcetam (KEPPRA) tablet 500 mg (has no administration in time range)  OXcarbazepine ER TB24 600 mg (has no administration in time range)  heparin injection 5,000 Units (has no administration in time range)  ceFEPIme (MAXIPIME) 2 g in sodium chloride 0.9 % 100 mL IVPB (has no administration in time range)  acetaminophen (TYLENOL) tablet  650 mg (650 mg Oral Given 07/22/23 0412)  lactated ringers bolus 1,000 mL (1,000 mLs Intravenous New Bag/Given 07/22/23 0726)    And  lactated ringers bolus 500 mL (500 mLs Intravenous New Bag/Given 07/22/23 0817)  ceFEPIme (MAXIPIME) 2 g in sodium chloride 0.9 % 100 mL IVPB (0 g Intravenous Stopped 07/22/23 0726)  metroNIDAZOLE (FLAGYL) IVPB 500 mg (0 mg Intravenous Stopped 07/22/23 0726)  vancomycin (VANCOCIN) IVPB 1000 mg/200 mL premix (  0 mg Intravenous Stopped 07/22/23 0726)    Mobility walks     Focused Assessments Cardiac Assessment Handoff:    No results found for: "CKTOTAL", "CKMB", "CKMBINDEX", "TROPONINI" No results found for: "DDIMER" Does the Patient currently have chest pain? No    R Recommendations: See Admitting Provider Note  Report given to:   Additional Notes:

## 2023-07-22 NOTE — Progress Notes (Signed)
Pharmacy Antibiotic Note  Wesley [name] is a 72 y.o. male admitted on 07/22/2023 with sepsis with a suspected urinary source.  PMH significant  bladder carcinoma, CKD, seizures.  Pharmacy has been consulted for Cefepime dosing.  Plan: Cefepime 2gm IV q24h Follow renal function F/u culture results and sensitivities  Height: 5\' 4"  (162.6 cm) Weight: 45.4 kg (100 lb) IBW/kg (Calculated) : 59.2  Temp (24hrs), Avg:100.7 F (38.2 C), Min:100.7 F (38.2 C), Max:100.7 F (38.2 C)  Recent Labs  Lab 07/22/23 0408 07/22/23 0418  WBC 30.8*  --   CREATININE 2.16*  --   LATICACIDVEN  --  0.9    Estimated Creatinine Clearance: 19.3 mL/min (A) (by C-G formula based on SCr of 2.16 mg/dL (H)).    Allergies  Allergen Reactions   Sulfa Antibiotics Rash    Childhood    Antimicrobials this admission: 10/14 Vancomycin x 1 10/14 Metronidazole x 1 10/14 Cefepime >>  Dose adjustments this admission:    Microbiology results: 10/14 BCx:      Thank you for allowing pharmacy to be a part of this patient's care.  Maryellen Pile, PharmD 07/22/2023 6:38 AM

## 2023-07-22 NOTE — Progress Notes (Signed)
Initial Nutrition Assessment  DOCUMENTATION CODES:   Severe malnutrition in context of chronic illness, Underweight  INTERVENTION:   -Boost Breeze po TID, each supplement provides 250 kcal and 9 grams of protein   -"High Calorie, High Protein" handout in AVS  NUTRITION DIAGNOSIS:   Severe Malnutrition related to chronic illness, cancer and cancer related treatments as evidenced by moderate fat depletion, severe muscle depletion, percent weight loss, energy intake < or equal to 75% for > or equal to 1 month.  GOAL:   Patient will meet greater than or equal to 90% of their needs  MONITOR:   PO intake, Supplement acceptance, Labs, Weight trends, I & O's  REASON FOR ASSESSMENT:   Consult Assessment of nutrition requirement/status  ASSESSMENT:   72 y.o. male with medical history significant of CKD 3B, UC, bladder CA s/p cystectomy with ileal conduit in Aug 2024.  Patient in room, reports he has ordered some [place] and stuffing for lunch. Reports he is trying to gain some  weight back. He has had decreased appetite since prior to his bladder surgery in August. Reports he was dealing with anxiety. Pt states he drinks Boost every other day at home but prefers clear liquid version. Will order Boost Breeze per patient preference. Pt also takes biotin, vitamin C and D at home.  Per urology note, pt with significant stool burden which requires aggressive bowel regimen. Likely impacting appetite as well.  Per weight records, pt has lost 27 lbs since 9/16 (21% wt loss x 1 month, significant for time frame).   Medications: Lactulose, Miralax, Senokot, Lactated ringers  Labs reviewed.  NUTRITION - FOCUSED PHYSICAL EXAM:  Flowsheet Row Most Recent Value  Orbital Region Moderate depletion  Upper Arm Region Severe depletion  Thoracic and Lumbar Region Moderate depletion  Buccal Region Moderate depletion  Temple Region Moderate depletion  Clavicle Bone Region Severe depletion  Clavicle  and Acromion Bone Region Severe depletion  Scapular Bone Region Severe depletion  Dorsal Hand Mild depletion  Patellar Region Moderate depletion  Anterior Thigh Region Moderate depletion  Posterior Calf Region Moderate depletion  Edema (RD Assessment) None  Hair Reviewed  [thinning on top]  Eyes Reviewed  Mouth Reviewed  Skin Reviewed  Nails Reviewed       Diet Order:   Diet Order             Diet regular Fluid consistency: Thin  Diet effective now                   EDUCATION NEEDS:   Education needs have been addressed  Skin:  Skin Assessment: Reviewed RN Assessment  Last BM:  PTA  Height:   Ht Readings from Last 1 Encounters:  07/22/23 5\' 4"  (1.626 m)    Weight:   Wt Readings from Last 1 Encounters:  07/22/23 45.4 kg    BMI:  Body mass index is 17.16 kg/m.  Estimated Nutritional Needs:   Kcal:  1750-1950  Protein:  85-100g  Fluid:  1.9L/day   Tilda Franco, MS, RD, LDN Inpatient Clinical Dietitian Contact information available via Amion

## 2023-07-22 NOTE — ED Triage Notes (Signed)
Pt arrives via [facility] following concerns for ongoing UTI. Reported feeling chills and fevers. Was seen at Urgent Care approx 9 days ago was dx UTI had complete abx. Sts concern today for ongoing symptoms that have worsened to where pt feel weak.  Per note pt hx extensive urology hx with stent and currently urostomy bag. Took ibuprofen at 8-9 pm yesterday 10/13. Denies abdominal pain / back pain.

## 2023-07-22 NOTE — Progress Notes (Signed)
Pt being followed by [facility] for Sepsis protocol.

## 2023-07-22 NOTE — ED Provider Notes (Signed)
[place] EMERGENCY DEPARTMENT AT [facility] Provider Note   CSN: [id number] Arrival date & time: 07/22/23  8657     History  Chief Complaint  Patient presents with   Weakness    Wesley [name] is a 72 y.o. male.  The history is provided by the patient and the spouse.  Weakness He has history of hypertension, hyperlipidemia, chronic kidney disease, ulcerative colitis, prostate cancer, bladder cancer status post cystectomy with ileal conduit and comes in with fever, chills, and generalized weakness over the last 4 days.  Temperature has been as high as 101.7.  He denies cough, nausea, vomiting.  His wife thinks that the urine may have been cloudy.   Home Medications Prior to Admission medications   Medication Sig Start Date End Date Taking? Authorizing Provider  amLODipine (NORVASC) 5 MG tablet Take 5 mg by mouth at bedtime.    [provider]  Biotin (BIOTIN 5000) 5 MG CAPS Take 5 mg by mouth daily.    [provider]  BREO ELLIPTA 100-25 MCG/ACT AEPB INHALE 1 PUFF BY MOUTH EVERY DAY 06/25/23   Virl Diamond A, MD  celecoxib (CELEBREX) 100 MG capsule Take 100 mg by mouth every evening.    [provider]  doxepin (SINEQUAN) 25 MG capsule Take 25 mg by mouth at bedtime.    [provider]  ferrous sulfate 325 (65 FE) MG tablet Take 325 mg by mouth daily.    [provider]  ketoconazole (NIZORAL) 2 % cream Apply 1 Application topically daily as needed for irritation.    [provider]  ketoconazole (NIZORAL) 2 % shampoo Apply 1 Application topically daily as needed (yeast).    [provider]  levETIRAcetam (KEPPRA) 500 MG tablet Take 500 mg by mouth 3 (three) times daily.    [provider]  OXcarbazepine ER (OXTELLAR XR) 600 MG TB24 Take 600 mg by mouth daily in the afternoon.    [provider]  primidone (MYSOLINE) 50 MG tablet Take 100 mg by mouth at bedtime.    [provider]  ustekinumab (STELARA) 90 MG/ML SOSY injection Inject 90 mg into the skin every 8 (eight) weeks.    [provider]      Allergies    Sulfa antibiotics    Review of Systems   Review of Systems  Neurological:  Positive for weakness.  All other systems reviewed and are negative.   Physical Exam Updated Vital Signs BP (!) 155/77 (BP Location: Left Arm)   Pulse (!) 120   Temp (!) 100.7 F (38.2 C) (Oral)   Resp (!) 23   Ht 5\' 4"  (1.626 m)   Wt 45.4 kg   SpO2 99%   BMI 17.16 kg/m  Physical Exam Vitals and nursing note reviewed.   Frail-appearing 72 year old male, resting comfortably and in no acute distress. Vital signs are significant for elevated heart rate, respiratory rate, temperature, blood pressure. Oxygen saturation is 99%, which is normal. Head is normocephalic and atraumatic. PERRLA, EOMI. Oropharynx is clear. Neck is nontender and supple without adenopathy. Lungs are clear without rales, wheezes, or rhonchi. Chest is nontender. Heart has regular rate and rhythm without murmur. Abdomen is soft, flat, nontender.  Urostomy present right lower quadrant. Extremities have no cyanosis or edema. Skin is warm and dry without rash. Neurologic: Mental status is normal, cranial nerves are intact, moves all extremities equally.  ED Results / Procedures / Treatments   Labs (all labs  ordered are listed, but only abnormal results are displayed) Labs Reviewed  CBC WITH DIFFERENTIAL/PLATELET - Abnormal; Notable for the following components:      Result Value   WBC 30.8 (*)    RBC 3.49 (*)    Hemoglobin 11.1 (*)    HCT 34.1 (*)    All other components within normal limits  CULTURE, BLOOD (ROUTINE X 2)  CULTURE, BLOOD (ROUTINE X 2)  PROTIME-INR  URINALYSIS, W/ REFLEX TO CULTURE (INFECTION SUSPECTED)  COMPREHENSIVE METABOLIC PANEL  APTT  I-STAT CG4 LACTIC ACID, ED    EKG None  Radiology No results found.  Procedures Procedures  Cardiac  monitor shows sinus tachycardia, per my interpretation.  Medications Ordered in ED Medications  lactated ringers infusion (has no administration in time range)  lactated ringers bolus 1,000 mL (has no administration in time range)    And  lactated ringers bolus 500 mL (has no administration in time range)  ceFEPIme (MAXIPIME) 2 g in sodium chloride 0.9 % 100 mL IVPB (has no administration in time range)  metroNIDAZOLE (FLAGYL) IVPB 500 mg (has no administration in time range)  vancomycin (VANCOCIN) IVPB 1000 mg/200 mL premix (has no administration in time range)  acetaminophen (TYLENOL) tablet 650 mg (650 mg Oral Given 07/22/23 4098)    ED Course/ Medical Decision Making/ A&P                                 Medical Decision Making Amount and/or Complexity of Data Reviewed Labs: ordered. Radiology: ordered. ECG/medicine tests: ordered.  Risk OTC drugs. Prescription drug management. Decision regarding hospitalization.   Fever in the setting of recent creation of ileal conduit.  This is most likely the source of his fever.  With rapid heart rate I am concerned of the possibility of sepsis.  I have initiated the evolving sepsis pathway.  Acetaminophen was ordered at triage.  I have reviewed initial laboratory test and my interpretation is normal lactic acid level, marked leukocytosis with WBC 30.8, mild anemia with slight drop in hemoglobin compared with 05/28/2023.  In spite of normal lactic acid level, I am electing to treat manage severe sepsis and I have initiated early goal-directed fluids and antibiotics for sepsis of uncertain cause.  I have reviewed his past records, and note hospital admission 05/21/2023-05/29/2023 for bladder cancer treated with robotic cystoprostatectomy and conduit diversion.  I have reviewed his additional laboratory tests, and my interpretation is stable renal insufficiency, metabolic acidosis without elevated anion gap likely secondary to ileal conduit I have  discussed case with Dr. Julian Reil of [facility] hospitalist who does agree to admit the patient but noted that CT of chest on 07/19/2023 seemed to show that prior ureteral stents had been removed and he was concerned about some degree of hydronephrosis was present.  I have ordered renal stone protocol CT scan to evaluate for possible ureteral obstruction and hydronephrosis.  CRITICAL CARE Performed by: Dione Booze Total critical care time: 65 minutes Critical care time was exclusive of separately billable procedures and treating other patients. Critical care was necessary to treat or prevent imminent or life-threatening deterioration. Critical care was time spent personally by me on the following activities: development of treatment plan with patient and/or surrogate as well as nursing, discussions with consultants, evaluation of patient's response to treatment, examination of patient, obtaining history from patient or surrogate, ordering and performing treatments and interventions, ordering and review of laboratory studies, ordering  and review of radiographic studies, pulse oximetry and re-evaluation of patient's condition.  Final Clinical Impression(s) / ED Diagnoses Final diagnoses:  Sepsis due to undetermined organism [facility])  Renal insufficiency  Elevated random blood glucose level  Normochromic normocytic anemia    Rx / DC Orders ED Discharge Orders     None         Dione Booze, MD 07/22/23 2302

## 2023-07-22 NOTE — Consult Note (Signed)
Urology Consult Note   Requesting Attending Physician:  Lewie Chamber, MD Service Providing Consult: Urology  Consulting Attending: Dr. Liliane Shi   Reason for Consult:  left hydronephrosis  HPI: Wesley [name] is seen in consultation for reasons noted above at the request of Lewie Chamber, MD. patient is known to our practice and recently underwent robotic cystectomy and treatment of stage III bladder cancer with Dr. Berneice Heinrich on 05/22/2023.  PMH also significant for moderate risk prostate cancer-s/p brachytherapy and external beam radiation completed in 2010, stage IV renal insufficiency/atrophic right kidney, malignant hydronephrosis, and Hx of bulbar urethral strictures.  Approximately 9 days ago patient presented to an urgent care who diagnosed him with a UTI and started him on antibiotics, switching him to Zyvox after culture data.  Despite completing his antibiotics he continued to have worsening symptoms of presenting to the emergency department on 07/22/2023.  He reports fever, chills, and generalized weakness.  Tmax 101.7.   On my arrival patient was alert, oriented, in no distress.  He felt like he was feeling significantly better already after receiving IV fluids and ABX.  He reports that his urine output has been normal volume without hematuria.  Stents have been removed around a week ago. WBCs are elevated at 30.8 thousand.  Urinalysis is suggestive of nitrite positive infection.   Assessment:  72 y.o. male with left side hydronephrosis. 1 month post op cystectomy.    Recommendations: #left hydronephrosis S/p cystectomy Patient has significant stool burden on CT imaging.  His urine function remains at his baseline.  Would recommend starting with aggressive bowel regimen and then reassessing his mild hydronephrosis in a few days. Urology will follow along peripherally in the meantime.  Please call with questions.  Case and plan discussed with Dr. Liliane Shi  Past Medical  History: Past Medical History:  Diagnosis Date   Arthritis    Atypical chest pain    cardiologist-- dr Leeann Must;  chronic occasional,  pressure / discomfort, LOV 07/14/21 (as of 06/08/22)   Bladder calculus    BPH with obstruction/lower urinary tract symptoms    Follows with Dr. Kasandra Knudsen, urologist.   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 Daphane Shepherd at [facility] neurology, LOV w/ FNP on 03/21/22 in Epic ( as of 06/08/22)   Generalized abdominal pain    w/  bloating due to UC  take doxepin   GERD (gastroesophageal reflux disease)    H/O urinary retention    History of bladder stone    History of hiatal hernia    History of kidney stones    History of prostate cancer 2010   urologist--- dr pace;   dx 2010,  Gleason 4+3, PSA 5.2;   03-04-2009  s/p radiactive prostate seed implants and external beam radiation   History of repair of hiatal hernia    02-11-2017  s/p lap paraesophageal hernia repair w/ nissen fundoplication   History of skin cancer    Hx of resection of meningioma 02/11/2009   benign right temperal lobe meningioma s/p resection  (residual hearing loss)   (neurosurgeon-- dr Tyrone Sage)   Hypertension    followed by cardiologist and pcp   Insomnia    Migraine with aura and  without status migrainosus, not intractable    neurologist-- dr Marlena Clipper   Prostate cancer [facility])    Simple renal cyst    bilateral ;   followed by urologist   Ulcerative colitis [facility])    followed by dr v. Rhetta Mura (GI);  dx 2010;  current treatment stelara q8wks, LOV 06/07/22 (as of 06/08/22)   Wears glasses     Past Surgical History:  Past Surgical History:  Procedure Laterality Date   BOTOX INJECTION N/A 01/29/2023   Procedure: BOTOX  INJECTION 100 UNITS;  Surgeon: Noel Christmas, MD;  Location: [facility];  Service: Urology;  Laterality: N/A;   BRAIN MENINGIOMA EXCISION  02/11/2009   @[facility] ;  craniotomy , right temporal medial sphonoid wing resection   CARPAL TUNNEL RELEASE Bilateral    right 09-05-2016  @[facility] ;   left 2013   COLONOSCOPY  12/2019   COLONOSCOPY  01/2022   CYSTOSCOPY  03/05/2012   Procedure: CYSTOSCOPY;  Surgeon: Antony Haste, MD;  Location: [facility];  Service: Urology;  Laterality: N/A;  Cysto Clot Evacuation and Fulgeration Of [place])   CYSTOSCOPY WITH INJECTION N/A 05/22/2023   Procedure: CYSTOSCOPY WITH INJECTION OF INDOCYANINE GREEN DYE;  Surgeon: Loletta Parish., MD;  Location: [facility];  Service: Urology;  Laterality: N/A;   CYSTOSCOPY WITH LITHOLAPAXY N/A 03/13/2019   Procedure: CYSTOSCOPY WITH lASER LITHOLAPAXY, DILATION OF STRICTURE;  Surgeon: Ihor Gully, MD;  Location: [facility];  Service: Urology;  Laterality: N/A;   CYSTOSCOPY WITH LITHOLAPAXY N/A 01/02/2022   Procedure: CYSTOSCOPY WITH LITHOTRIPSY;  Surgeon: Noel Christmas, MD;  Location: [facility];  Service: Urology;  Laterality: N/A;   CYSTOSCOPY WITH RETROGRADE PYELOGRAM, URETEROSCOPY AND STENT PLACEMENT Right 03/13/2022   Procedure: CYSTOSCOPY WITH RETROGRADE PYELOGRAM, URETEROSCOPY AND STENT PLACEMENT;  Surgeon: Noel Christmas, MD;  Location: [facility];  Service: Urology;  Laterality: Right;  75 MINS   CYSTOSCOPY WITH RETROGRADE PYELOGRAM, URETEROSCOPY AND STENT PLACEMENT Right 08/07/2022   Procedure: CYSTOSCOPY WITH RETROGRADE PYELOGRAM, URETEROSCOPY AND STENT PLACEMENT, REMOVAL OF NEPHROURETERAL STENT;  Surgeon: Noel Christmas, MD;  Location: [facility];  Service: Urology;  Laterality: Right;  75 MINS   CYSTOSCOPY WITH URETHRAL DILATATION N/A 08/23/2020   Procedure: CYSTOSCOPY WITH URETHRAL DILATATION, BLADDER BIOPSY, FULGERATION;  Surgeon:  Noel Christmas, MD;  Location: [facility];  Service: Urology;  Laterality: N/A;  1 HR   CYSTOSCOPY WITH URETHRAL DILATATION N/A 03/13/2021   Procedure: CYSTOSCOPY WITH URETHRAL BALLOON DILATATION/ FOLEY CATHETER PLACEMENT;  Surgeon: Noel Christmas, MD;  [facility];  Service: Urology;  Laterality: N/A;   CYSTOSCOPY WITH URETHRAL DILATATION Left 07/08/2009   @[facility] :   with left  RTG   CYSTOSCOPY WITH URETHRAL DILATATION N/A 06/19/2022   Procedure: CYSTOSCOPY WITH OPTILUME URETHRAL DILATATION;  Surgeon: Noel Christmas, MD;  Location: [facility];  Service: Urology;  Laterality: N/A;   CYSTOSCOPY/URETEROSCOPY/HOLMIUM LASER/STENT PLACEMENT Bilateral 01/29/2023   Procedure: DIAGNOSTIC CYSTOSCOPY , LEFT RETROGRADE PYELOGRAM, LEFT URETERAL STENT PLACEMENT, AND STONE BASKETING;  Surgeon: Noel Christmas, MD;  Location: [facility];  Service: Urology;  Laterality: Bilateral;   HOLMIUM LASER APPLICATION N/A 08/23/2020   Procedure: HOLMIUM LASER APPLICATION OF URETHRAL STONE;  Surgeon: Noel Christmas, MD;  Location: [facility];  Service: Urology;  Laterality: N/A;   HOLMIUM LASER APPLICATION Right 03/13/2022   Procedure: HOLMIUM LASER APPLICATION;  Surgeon: Noel Christmas, MD;  Location: [facility];  Service: Urology;  Laterality: Right;   HOLMIUM LASER APPLICATION Right 08/07/2022   Procedure: HOLMIUM LASER APPLICATION;  Surgeon: Noel Christmas, MD;  [facility];  Service: Urology;  Laterality: Right;   INGUINAL HERNIA REPAIR Right 05/03/2015   @[facility]    INSERTION PROSTATE RADIATION SEED  03/04/2009   @[facility]    IR NEPHROSTOMY PLACEMENT RIGHT  07/18/2022   IR URETERAL STENT RIGHT NEW ACCESS W/O SEP NEPHROSTOMY CATH  02/04/2023   KNEE CARTILAGE SURGERY Left    1980S   LAPAROSCOPIC PARAESOPHAGEAL HERNIA REPAIR  02/11/2017   @[facility] ;  W/  NISSEN FUNDOPLICATION (MESH)   LYMPH NODE DISSECTION Bilateral  05/22/2023   Procedure: LYMPH NODE DISSECTION;  Surgeon: Loletta Parish., MD;  Location[facility];  Service: Urology;  Laterality: Bilateral;   NEPHROLITHOTOMY Right 03/29/2023   Procedure: NEPHROLITHOTOMY PERCUTANEOUS;  Surgeon: Loletta Parish., MD;  Location[facility];  Service: Urology;  Laterality: Right;  2 HRS   PROSTATE BIOPSY     ROBOT ASSISTED LAPAROSCOPIC COMPLETE CYSTECT ILEAL CONDUIT N/A 05/22/2023   Procedure: XI ROBOTIC ASSISTED LAPAROSCOPIC COMPLETE CYSTECT ILEAL CONDUIT;  Surgeon: Loletta Parish., MD;  [facility];  Service: Urology;  Laterality: N/A;   ROBOT ASSISTED LAPAROSCOPIC RADICAL PROSTATECTOMY N/A 05/22/2023   Procedure: XI ROBOTIC ASSISTED LAPAROSCOPIC RADICAL PROSTATECTOMY;  Surgeon: Loletta Parish., MD;  Location[facility];  Service: Urology;  Laterality: N/A;   TOE SURGERY     TRANSURETHRAL RESECTION OF BLADDER TUMOR N/A 01/02/2022   Procedure: TRANSURETHRAL RESECTION OF BLADDER TUMOR (TURBT);  Surgeon: Noel Christmas, MD;  Location: [facility];  Service: Urology;  Laterality: N/A;   TRANSURETHRAL RESECTION OF BLADDER TUMOR N/A 01/29/2023   Procedure: TRANSURETHRAL RESECTION OF BLADDER TUMOR (TURBT);  Surgeon: Noel Christmas, MD;  Location: [facility];  Service: Urology;  Laterality: N/A;    Medication: Current Facility-Administered Medications  Medication Dose Route Frequency Provider Last Rate Last Admin   heparin injection 5,000 Units  5,000 Units Subcutaneous Q8H Julian Reil, Jared M, DO       lactated ringers infusion   Intravenous Continuous Dione Booze, MD 150 mL/hr at 07/22/23 0515 New Bag at 07/22/23 0515   lactulose (CHRONULAC) 10 GM/15ML solution 20 g  20 g Oral BID Lewie Chamber, MD   20 g at 07/22/23 0944   levETIRAcetam (KEPPRA) tablet 500 mg  500 mg Oral TID Hillary Bow, DO   500 mg at 07/22/23 0910   Levofloxacin (LEVAQUIN) IVPB 250 mg  250 mg Intravenous Q48H Hessie Knows, [facility]   Stopped  at 07/22/23 1100   OXcarbazepine ER TB24 600 mg  600 mg Oral Q1500 Hillary Bow, DO       polyethylene glycol (MIRALAX / GLYCOLAX) packet 17 g  17 g Oral Daily Lewie Chamber, MD   17 g at 07/22/23 0944   senna-docusate (Senokot-S) tablet 1 tablet  1 tablet Oral BID Lewie Chamber, MD   1 tablet at 07/22/23 8119   Facility-Administered Medications Ordered in Other Encounters  Medication Dose Route Frequency Provider Last Rate Last Admin   magnesium citrate solution 1 Bottle  1 Bottle Oral Once Loletta Parish., MD       magnesium citrate solution 1 Bottle  1 Bottle Oral Once Berneice Heinrich Delbert Phenix., MD        Allergies: Allergies  Allergen Reactions   Sulfa Antibiotics Rash  Childhood    Social History: Social History   Tobacco Use   Smoking status: Former    Current packs/day: 0.00    Average packs/day: 1 pack/day for 10.0 years (10.0 ttl pk-yrs)    Types: Cigarettes    Start date: 03/01/1977    Quit date: 03/02/1987    Years since quitting: 36.4   Smokeless tobacco: Never  Vaping Use   Vaping status: Never Used  Substance Use Topics   Alcohol use: Yes    Comment: Rare   Drug use: Never    Family History No family history on file.  Review of Systems  Constitutional:  Positive for chills and fever.     Objective   Vital signs in last 24 hours: BP (!) 169/79 (BP Location: Left Arm)   Pulse 92   Temp 99.2 F (37.3 C) (Oral)   Resp 16   Ht 5\' 4"  (1.626 m)   Wt 45.4 kg   SpO2 100%   BMI 17.16 kg/m   Physical Exam General: NAD, A&O, resting, appropriate HEENT: [place]/AT Pulmonary: Normal work of breathing Cardiovascular: no cyanosis Abdomen: Soft, NTTP, non-distended, urostomy is mildly prolapsed GU: Urostomy bag in place draining and cloudy d/t abundant mucus stranding.   Most Recent Labs: Lab Results  Component Value Date   WBC 30.8 (H) 07/22/2023   HGB 11.1 (L) 07/22/2023   HCT 34.1 (L) 07/22/2023   PLT 152 07/22/2023    Lab Results   Component Value Date   NA 135 07/22/2023   K 5.0 07/22/2023   CL 111 07/22/2023   CO2 16 (L) 07/22/2023   BUN 43 (H) 07/22/2023   CREATININE 2.16 (H) 07/22/2023   CALCIUM 8.8 (L) 07/22/2023    Lab Results  Component Value Date   INR 1.2 07/22/2023   APTT 36 07/22/2023     Urine Culture: @[id number] (laburin,org,[id number],[id number])@   IMAGING: CT Renal Stone Study  Result Date: 07/22/2023 CLINICAL DATA:  72 year old male with possible sepsis. UTI, fevers and chills. Weakness. Prostate cancer. EXAM: CT ABDOMEN AND PELVIS WITHOUT CONTRAST TECHNIQUE: Multidetector CT imaging of the abdomen and pelvis was performed following the standard protocol without IV contrast. RADIATION DOSE REDUCTION: This exam was performed according to the departmental dose-optimization program which includes automated exposure control, adjustment of the mA and/or kV according to patient size and/or use of iterative reconstruction technique. COMPARISON:  Recent outpatient Chest CT 07/19/2023 reported separately. Previous noncontrast CT Abdomen and Pelvis 01/11/2023. FINDINGS: Lower chest: No change from the chest CT 07/19/2023 (please see that report) Hepatobiliary: Larger gallbladder but otherwise negative noncontrast liver and gallbladder. No pericholecystic inflammation identified. Pancreas: Negative. Spleen: Negative. Adrenals/Urinary Tract: Negative adrenal glands. Right renal atrophy and nephrolithiasis. No right hydronephrosis or hydroureter following removal of ureteral stents demonstrated on chest CTA last month. Exophytic and simple fluid density left upper pole renal cyst is stable and appears benign (no follow-up imaging recommended). Left renal hydronephrosis and hydroureter are progressed following ureteral stent removal when compared to 06/25/2023. The proximal left ureter is dilated in the abdomen (series 2, image 44) but is difficult to delineate distally. And urinary bladder now is surgically absent with  multiple pelvic surgical clips and right abdominal ileostomy. The ileostomy loop is nondilated. But small bowel detail is limited due to lack of contrast and visceral fat. Stomach/Bowel: Abundant retained stool in redundant large bowel. New right abdominal ileostomy, probably an ileal conduit. No free air or free fluid identified. No dilated small bowel. Vascular/Lymphatic: Vascular patency is not  evaluated in the absence of IV contrast. Normal caliber abdominal aorta. Aortoiliac calcified atherosclerosis. No lymphadenopathy identified in the absence of contrast. Reproductive: Stable prostate brachytherapy. Other: No pelvis free fluid. Musculoskeletal: Stable visualized osseous structures. Widespread spine degeneration. Advanced hip degeneration, severe on the left. No acute or suspicious osseous finding identified. IMPRESSION: 1. Status post cystectomy and right abdominal ileal conduit. New/increased and Moderate Left hydronephrosis and hydroureter following removal of ureteral stents which were visible on CTA Chest 06/25/2023. Stable right renal atrophy and nephrolithiasis. 2. No dilated small bowel, and ileostomy loop also appears decompressed. Abundant retained stool in redundant large bowel. 3. Abnormal lung bases, see details on Chest CT 07/19/2023 reported this morning. 4. No other acute or inflammatory process in the noncontrast abdomen or pelvis. Aortic Atherosclerosis (ICD10-I70.0). Electronically Signed   By: Odessa Fleming M.D.   On: 07/22/2023 06:46   DG Chest Port 1 View  Result Date: 07/22/2023 CLINICAL DATA:  72 year old male with possible sepsis. UTI, fevers and chills. EXAM: PORTABLE CHEST 1 VIEW COMPARISON:  Portable chest 05/28/2023 and earlier. FINDINGS: Portable AP semi upright view at 0444 hours. Mildly lower lung volumes. Allowing for portable technique the lungs are clear. Calcified aortic atherosclerosis. Normal cardiac size and mediastinal contours. Visualized tracheal air column is within  normal limits. No pneumothorax or pleural effusion. Negative visible bowel gas. No acute osseous abnormality identified. IMPRESSION: No acute cardiopulmonary abnormality. Electronically Signed   By: Odessa Fleming M.D.   On: 07/22/2023 06:20    ------  Elmon Kirschner, NP Pager: [phone]   Please contact the urology consult pager with any further questions/concerns.

## 2023-07-22 NOTE — Assessment & Plan Note (Signed)
-   Severe Malnutrition related to chronic illness, cancer and cancer related treatments as evidenced by moderate fat depletion, severe muscle depletion, percent weight loss, energy intake < or equal to 75% for > or equal to 1 month.  - Patient's BMI is Body mass index is 17.16 kg/m. - seen by RD, appreciate assistance. Continue plan per RD to include Boost Breeze TID

## 2023-07-22 NOTE — Progress Notes (Signed)
Progress Note    WEBSTER LIERA   [id number]  DOB: [birth year]  DOA: 07/22/2023     0 PCP: [name], MD  Initial CC: fever, weakness  Hospital Course: Mr. Benites is a 72 yo male with PMH prostate cancer s/p brachytherapy, bladder cancer s/p recent cystectomy and creation of ileal conduit on 05/22/23.  He presented due to ongoing fever at home and persistent weakness.  He recently was treated beginning 07/09/2023 for a UTI and antibiotics were then changed after outpatient urgent care cultures further updated (see A&P).  He felt a little better after second course of antibiotics but then after becoming febrile and feeling bad again, he presented for further evaluation. He continues to have some difficulty with nutrition and has continued to lose some weight.  He also continues to develop worsening constipation. UA in the ER was notable for positive nitrite, large LE, greater than 50 WBC, many bacteria.  WBC elevated at 30K with 1% bands and left shift.  Lactic acid normal. He was started on antibiotics and admitted for further workup.  CT renal protocol also performed on admission which showed new moderate left hydroureteronephrosis.  Also large stool burden noted.  Interval History:  Seen in the ER this morning.  Main complaint was his fever and generalized weakness at home.  He states he did feel better a little bit after the second course of antibiotic but then began feeling bad again and therefore came to the hospital. He endorses ongoing poor appetite at home and symptoms of constipation.  Assessment and Plan: * Sepsis secondary to UTI (HCC) - Fever, tachycardia, leukocytosis.  Suspected urinary source -UA consistent with suspected UTI.  CT shows hydroureteronephrosis and large stool burden -Contacted urgent care and obtained culture results.  Prior culture grew 100k Klebsiella oxytoca, 100k E faecalis, and low yield of MRSE.  Resistance and sensitivities reviewed.   He was initially treated with Cipro then changed to Zyvox to cover Klebsiella then Enterococcus respectively.  -For now, changed to Levaquin which covers both organisms per sensitivity report.  Discussed with pharmacy as well -Follow-up new urine culture and will modify antibiotics further as necessary - discussed with Dr. 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 Berneice Heinrich.  Not unexpected after recent surgery and in clinical context -Due to significant stool burden on CT, will pursue aggressive laxative regimen as well in case of any potential compression effect  CKD stage 3b, GFR 30-44 ml/min (HCC) - patient has history of CKD3b. Baseline creat ~ 1.8 - 2, eGFR~ 40 - currently at baseline   Protein-calorie malnutrition, severe - Severe Malnutrition related to chronic illness, cancer and cancer related treatments as evidenced by moderate fat depletion, severe muscle depletion, percent weight loss, energy intake < or equal to 75% for > or equal to 1 month.  - Patient's BMI is Body mass index is 17.16 kg/m. - seen by RD, appreciate assistance. Continue plan per RD to include Boost Breeze TID   HTN (hypertension) Hold amlodipine in setting of sepsis.  Complex partial seizures (HCC) Cont Keppra Cont oxcarbazepine   Old records reviewed in assessment of this patient  Antimicrobials: Vanc, Cefepime, Flagyl: 10/14 x 1 Levaquin 10/14 >> current   DVT prophylaxis:  heparin injection 5,000 Units Start: 07/22/23 1400  Code Status:   Code Status: Full Code  Mobility Assessment (Last 72 Hours)     Mobility Assessment   No documentation.           Barriers to discharge: none Disposition Plan:  Home Status is: Inpt  Objective: Blood pressure (!)  169/79, pulse 92, temperature 99.2 F (37.3 C), temperature source Oral, resp. rate 16, height 5\' 4"  (1.626 m), weight 45.4 kg, SpO2 100%.  Examination:  Physical Exam Constitutional:      General: He is not in acute distress.    Appearance: Normal appearance.  HENT:     Head: Normocephalic and atraumatic.     Mouth/Throat:     Mouth: Mucous membranes are moist.  Eyes:     Extraocular Movements: Extraocular movements intact.  Cardiovascular:     Rate and Rhythm: Normal rate and regular rhythm.  Pulmonary:     Effort: Pulmonary effort is normal. No respiratory distress.     Breath sounds: Normal breath sounds. No wheezing.  Abdominal:     General: Bowel sounds are normal. There is no distension.     Palpations: Abdomen is soft.     Tenderness: There is no abdominal tenderness.     Comments: Urostomy bag noted with sediment and yellow urine  Musculoskeletal:        General: Normal range of motion.     Cervical back: Normal range of motion and neck supple.  Skin:    General: Skin is warm and dry.  Neurological:     General: No focal deficit present.     Mental Status: He is alert.  Psychiatric:        Mood and Affect: Mood normal.        Behavior: Behavior normal.      Consultants:  Urology  Procedures:    Data Reviewed: Results for orders placed or performed during the hospital encounter of 07/22/23 (from the past 24 hour(s))  Comprehensive metabolic panel     Status: Abnormal   Collection Time: 07/22/23  4:08 AM  Result Value Ref Range   Sodium 135 135 - 145 mmol/L   Potassium 5.0 3.5 - 5.1 mmol/L   Chloride 111 98 - 111 mmol/L   CO2 16 (L) 22 - 32 mmol/L   Glucose, Bld 138 (H) 70 - 99 mg/dL   BUN 43 (H) 8 - 23 mg/dL   Creatinine, Ser 4.25 (H) 0.61 - 1.24 mg/dL   Calcium 8.8 (L) 8.9 - 10.3 mg/dL   Total Protein 7.6 6.5 - 8.1 g/dL   Albumin 3.2 (L) 3.5 - 5.0 g/dL   AST 32 15 - 41 U/L   ALT 15 0 - 44 U/L   Alkaline Phosphatase 81 38 - 126 U/L   Total  Bilirubin 1.3 (H) 0.3 - 1.2 mg/dL   GFR, Estimated 31 (L) >60 mL/min   Anion gap 8 5 - 15  CBC with Differential     Status: Abnormal   Collection Time: 07/22/23  4:08 AM  Result Value Ref Range   WBC 30.8 (H) 4.0 - 10.5 K/uL   RBC 3.49 (L) 4.22 - 5.81 MIL/uL   Hemoglobin 11.1 (L) 13.0 - 17.0 g/dL   HCT 95.6 (L) 38.7 - 56.4 %   MCV 97.7 80.0 - 100.0 fL   MCH 31.8 26.0 - 34.0 pg   MCHC 32.6 30.0 - 36.0 g/dL   RDW 33.2 95.1 - 88.4 %   Platelets 152 150 - 400 K/uL   nRBC 0.0  0.0 - 0.2 %   Neutrophils Relative % 90 %   Neutro Abs 27.6 (H) 1.7 - 7.7 K/uL   Lymphocytes Relative 3 %   Lymphs Abs 0.9 0.7 - 4.0 K/uL   Monocytes Relative 6 %   Monocytes Absolute 1.9 (H) 0.1 - 1.0 K/uL   Eosinophils Relative 0 %   Eosinophils Absolute 0.0 0.0 - 0.5 K/uL   Basophils Relative 0 %   Basophils Absolute 0.1 0.0 - 0.1 K/uL   WBC Morphology Mild Left Shift (1-5% metas, occ myelo)    Immature Granulocytes 1 %   Abs Immature Granulocytes 0.30 (H) 0.00 - 0.07 K/uL  Protime-INR     Status: None   Collection Time: 07/22/23  4:08 AM  Result Value Ref Range   Prothrombin Time 14.9 11.4 - 15.2 seconds   INR 1.2 0.8 - 1.2  I-Stat Lactic Acid, ED     Status: None   Collection Time: 07/22/23  4:18 AM  Result Value Ref Range   Lactic Acid, Venous 0.9 0.5 - 1.9 mmol/L  APTT     Status: None   Collection Time: 07/22/23  4:36 AM  Result Value Ref Range   aPTT 36 24 - 36 seconds  I-Stat Lactic Acid, ED     Status: None   Collection Time: 07/22/23  6:56 AM  Result Value Ref Range   Lactic Acid, Venous 1.0 0.5 - 1.9 mmol/L  Urinalysis, w/ Reflex to Culture (Infection Suspected) -Urine, Clean Catch     Status: Abnormal   Collection Time: 07/22/23  7:38 AM  Result Value Ref Range   Specimen Source URINE, CLEAN CATCH    Color, Urine YELLOW YELLOW   APPearance TURBID (A) CLEAR   Specific Gravity, Urine 1.013 1.005 - 1.030   pH 5.0 5.0 - 8.0   Glucose, UA NEGATIVE NEGATIVE mg/dL   Hgb urine dipstick  MODERATE (A) NEGATIVE   Bilirubin Urine NEGATIVE NEGATIVE   Ketones, ur NEGATIVE NEGATIVE mg/dL   Protein, ur 284 (A) NEGATIVE mg/dL   Nitrite POSITIVE (A) NEGATIVE   Leukocytes,Ua LARGE (A) NEGATIVE   RBC / HPF >50 0 - 5 RBC/hpf   WBC, UA >50 0 - 5 WBC/hpf   Bacteria, UA MANY (A) NONE SEEN   Squamous Epithelial / HPF 0-5 0 - 5 /HPF   WBC Clumps PRESENT    Mucus PRESENT     I have reviewed pertinent nursing notes, vitals, labs, and images as necessary. I have ordered labwork to follow up on as indicated.  I have reviewed the last notes from staff over past 24 hours. I have discussed patient's care plan and test results with nursing staff, CM/SW, and other staff as appropriate.  Time spent: Greater than 50% of the 55 minute visit was spent in counseling/coordination of care for the patient as laid out in the A&P.   LOS: 0 days   Lewie Chamber, MD [facility] 07/22/2023, 2:28 PM

## 2023-07-22 NOTE — Assessment & Plan Note (Addendum)
-   Fever, tachycardia, leukocytosis.  Suspected urinary source -UA consistent with suspected UTI.  CT shows hydroureteronephrosis and large stool burden -Contacted urgent care and obtained culture results.  Prior culture grew 100k Klebsiella oxytoca, 100k E faecalis, and low yield of MRSE.  Resistance and sensitivities reviewed.  He was initially treated with Cipro then changed to Zyvox to cover Klebsiella then Enterococcus respectively.  -For now, changed to Levaquin which covers both organisms per sensitivity report.  Discussed with pharmacy as well -Follow-up new urine culture and will modify antibiotics further as necessary - discussed with Dr. Berneice Heinrich today as well; urology was also apparently consulted from the ER on admission too

## 2023-07-22 NOTE — Assessment & Plan Note (Addendum)
-   S/p cystectomy and ileal conduit creation in 05/22/23 - CT now with left sided hydroureteronephrosis; discussed with Dr. Berneice Heinrich.  Not unexpected after recent surgery and in clinical context -Due to significant stool burden on CT, will pursue aggressive laxative regimen as well in case of any potential compression effect

## 2023-07-22 NOTE — Hospital Course (Signed)
Mr. Goldring is a 72 yo male with PMH prostate cancer s/p brachytherapy, bladder cancer s/p recent cystectomy and creation of ileal conduit on 05/22/23.  He presented due to ongoing fever at home and persistent weakness.  He recently was treated beginning 07/09/2023 for a UTI and antibiotics were then changed after outpatient urgent care cultures further updated (see A&P).  He felt a little better after second course of antibiotics but then after becoming febrile and feeling bad again, he presented for further evaluation. He continues to have some difficulty with nutrition and has continued to lose some weight.  He also continues to develop worsening constipation. UA in the ER was notable for positive nitrite, large LE, greater than 50 WBC, many bacteria.  WBC elevated at 30K with 1% bands and left shift.  Lactic acid normal. He was started on antibiotics and admitted for further workup.  CT renal protocol also performed on admission which showed new moderate left hydroureteronephrosis.  Also large stool burden noted.

## 2023-07-22 NOTE — Progress Notes (Signed)
A consult was received from an ED physician for Cefepime + Vancomycin per pharmacy dosing.  The patient's profile has been reviewed for ht/wt/allergies/indication/available labs.   A one time order has been placed for Cefpeime 2gm + Vancomycin 1gm IV.  Further antibiotics/pharmacy consults should be ordered by admitting physician if indicated.                       Thank you, Junita Push PharmD 07/22/2023  4:42 AM

## 2023-07-22 NOTE — Assessment & Plan Note (Signed)
Cont Keppra Cont oxcarbazepine

## 2023-07-23 DIAGNOSIS — K5909 Other constipation: Secondary | ICD-10-CM

## 2023-07-23 DIAGNOSIS — K5900 Constipation, unspecified: Secondary | ICD-10-CM

## 2023-07-23 DIAGNOSIS — N390 Urinary tract infection, site not specified: Secondary | ICD-10-CM

## 2023-07-23 DIAGNOSIS — N1832 Chronic kidney disease, stage 3b: Secondary | ICD-10-CM | POA: Diagnosis not present

## 2023-07-23 DIAGNOSIS — A419 Sepsis, unspecified organism: Secondary | ICD-10-CM | POA: Diagnosis not present

## 2023-07-23 DIAGNOSIS — C679 Malignant neoplasm of bladder, unspecified: Secondary | ICD-10-CM | POA: Diagnosis not present

## 2023-07-23 LAB — BASIC METABOLIC PANEL
Anion gap: 7 (ref 5–15)
BUN: 39 mg/dL — ABNORMAL HIGH (ref 8–23)
CO2: 19 mmol/L — ABNORMAL LOW (ref 22–32)
Calcium: 8.3 mg/dL — ABNORMAL LOW (ref 8.9–10.3)
Chloride: 109 mmol/L (ref 98–111)
Creatinine, Ser: 2.2 mg/dL — ABNORMAL HIGH (ref 0.61–1.24)
GFR, Estimated: 31 mL/min — ABNORMAL LOW (ref >60)
Glucose, Bld: 125 mg/dL — ABNORMAL HIGH (ref 70–99)
Potassium: 3.8 mmol/L (ref 3.5–5.1)
Sodium: 135 mmol/L (ref 135–145)

## 2023-07-23 LAB — MAGNESIUM: Magnesium: 1.8 mg/dL (ref 1.7–2.4)

## 2023-07-23 LAB — CBC WITH DIFFERENTIAL/PLATELET
Abs Immature Granulocytes: 0.07 10*3/uL (ref 0.00–0.07)
Basophils Absolute: 0.1 10*3/uL (ref 0.0–0.1)
Basophils Relative: 0 %
Eosinophils Absolute: 0 10*3/uL (ref 0.0–0.5)
Eosinophils Relative: 0 %
HCT: 30.3 % — ABNORMAL LOW (ref 39.0–52.0)
Hemoglobin: 9.8 g/dL — ABNORMAL LOW (ref 13.0–17.0)
Immature Granulocytes: 1 %
Lymphocytes Relative: 7 %
Lymphs Abs: 1 10*3/uL (ref 0.7–4.0)
MCH: 31.5 pg (ref 26.0–34.0)
MCHC: 32.3 g/dL (ref 30.0–36.0)
MCV: 97.4 fL (ref 80.0–100.0)
Monocytes Absolute: 1.2 10*3/uL — ABNORMAL HIGH (ref 0.1–1.0)
Monocytes Relative: 8 %
Neutro Abs: 12.5 10*3/uL — ABNORMAL HIGH (ref 1.7–7.7)
Neutrophils Relative %: 84 %
Platelets: 139 10*3/uL — ABNORMAL LOW (ref 150–400)
RBC: 3.11 MIL/uL — ABNORMAL LOW (ref 4.22–5.81)
RDW: 13.5 % (ref 11.5–15.5)
WBC: 14.8 10*3/uL — ABNORMAL HIGH (ref 4.0–10.5)
nRBC: 0 % (ref 0.0–0.2)

## 2023-07-23 LAB — CORTISOL-AM, BLOOD: Cortisol - AM: 9.7 ug/dL (ref 6.7–22.6)

## 2023-07-23 LAB — PROCALCITONIN: Procalcitonin: 0.42 ng/mL

## 2023-07-23 LAB — PROTIME-INR
INR: 1.2 (ref 0.8–1.2)
Prothrombin Time: 15.4 s — ABNORMAL HIGH (ref 11.4–15.2)

## 2023-07-23 MED ORDER — PRIMIDONE 50 MG PO TABS
100.0000 mg | ORAL_TABLET | Freq: Every day | ORAL | Status: DC
  Administered 2023-07-23 – 2023-07-24 (×2): 100 mg via ORAL
  Filled 2023-07-23 (×2): qty 2

## 2023-07-23 MED ORDER — DOXEPIN HCL 25 MG PO CAPS
25.0000 mg | ORAL_CAPSULE | Freq: Every day | ORAL | Status: DC
  Administered 2023-07-23 – 2023-07-24 (×2): 25 mg via ORAL
  Filled 2023-07-23 (×2): qty 1

## 2023-07-23 MED ORDER — FLUTICASONE FUROATE-VILANTEROL 100-25 MCG/ACT IN AEPB
1.0000 | INHALATION_SPRAY | Freq: Every day | RESPIRATORY_TRACT | Status: DC
  Administered 2023-07-23 – 2023-07-25 (×3): 1 via RESPIRATORY_TRACT
  Filled 2023-07-23: qty 28

## 2023-07-23 MED ORDER — FINASTERIDE 5 MG PO TABS
5.0000 mg | ORAL_TABLET | Freq: Every day | ORAL | Status: DC
  Administered 2023-07-23 – 2023-07-25 (×3): 5 mg via ORAL
  Filled 2023-07-23 (×3): qty 1

## 2023-07-23 NOTE — Assessment & Plan Note (Signed)
-   Continue laxative regimen, see above

## 2023-07-23 NOTE — Plan of Care (Signed)

## 2023-07-23 NOTE — Progress Notes (Signed)
Progress Note    Wesley [name]   NUU:[id number]  DOB: 01/24/1951  DOA: 07/22/2023     1 PCP: [name], MD  Initial CC: fever, weakness  Hospital Course: Wesley [name] is a 72 yo male with PMH prostate cancer s/p brachytherapy, bladder cancer s/p recent cystectomy and creation of ileal conduit on 05/22/23.  He presented due to ongoing fever at home and persistent weakness.  He recently was treated beginning 07/09/2023 for a UTI and antibiotics were then changed after outpatient urgent care cultures further updated (see A&P).  He felt a little better after second course of antibiotics but then after becoming febrile and feeling bad again, he presented for further evaluation. He continues to have some difficulty with nutrition and has continued to lose some weight.  He also continues to develop worsening constipation. UA in the ER was notable for positive nitrite, large LE, greater than 50 WBC, many bacteria.  WBC elevated at 30K with 1% bands and left shift.  Lactic acid normal. He was started on antibiotics and admitted for further workup.  CT renal protocol also performed on admission which showed new moderate left hydroureteronephrosis.  Also large stool burden noted.  Interval History:  No events overnight.  Seems to be feeling better in general when seen this morning.  Low-grade fever noted overnight.  Urine output normal. Laxatives also working and he has had bowel movements since admission.  Assessment and Plan: * Sepsis secondary to UTI (HCC) - Fever, tachycardia, leukocytosis.  Suspected urinary source -UA consistent with suspected UTI.  CT shows hydroureteronephrosis and large stool burden -Contacted urgent care and obtained culture results.  Prior culture grew 100k Klebsiella oxytoca, 100k E faecalis, and low yield of MRSE.  Resistance and sensitivities reviewed.  He was initially treated with Cipro then changed to Zyvox to cover Klebsiella then Enterococcus  respectively.  -For now, changed to Levaquin which covers both organisms per sensitivity report.  Discussed with pharmacy as well -Follow-up new urine culture and will modify antibiotics further as necessary - discussed with Dr. 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 Berneice Heinrich.  Not unexpected after recent surgery and in clinical context -Due to significant stool burden on CT, will pursue aggressive laxative regimen as well in case of any potential compression effect  CKD stage 3b, GFR 30-44 ml/min (HCC) - patient has history of CKD3b. Baseline creat ~ 1.8 - 2, eGFR~ 40 - currently at baseline   Constipation - Continue laxative regimen, see above  Protein-calorie malnutrition, severe - Severe Malnutrition related to chronic illness, cancer and cancer related treatments as evidenced by moderate fat depletion, severe muscle depletion, percent weight loss, energy intake < or equal to 75% for > or equal to 1 month.  - Patient's BMI is Body mass index is 17.16 kg/m. - seen by RD, appreciate assistance. Continue plan per RD to include Boost Breeze TID   HTN (hypertension) Hold amlodipine in setting of sepsis.  Complex partial seizures (HCC) Cont Keppra Cont oxcarbazepine   Old records reviewed in assessment of this patient  Antimicrobials: Vanc, Cefepime, Flagyl: 10/14 x 1 Levaquin 10/14 >> current  DVT prophylaxis:  heparin injection 5,000 Units Start: 07/22/23 1400   Code Status:   Code Status: Full Code  Mobility Assessment (Last 72 Hours)     Mobility Assessment     Row Name 07/22/23 2122 07/22/23 1155          Does patient have an order for bedrest or is patient medically unstable No - Continue assessment No - Continue assessment      What is the highest level of mobility based on the progressive mobility assessment? Level 5 (Walks with assist in room/hall) - Balance while stepping forward/back and can walk in room with assist - Complete Level 5 (Walks with assist in room/hall) - Balance while stepping forward/back and can walk in room with assist - Complete               Barriers to discharge: none Disposition Plan:  Home Status is: Inpt  Objective: Blood pressure 107/61, pulse 85, temperature 99 F (37.2 C), temperature source Oral, resp. rate 16, height 5\' 4"  (1.626 m), weight 45.4 kg, SpO2 98%.  Examination:  Physical Exam Constitutional:      General: He is not in acute distress.    Appearance: Normal appearance.  HENT:     Head: Normocephalic and atraumatic.     Mouth/Throat:     Mouth: Mucous membranes are moist.  Eyes:     Extraocular Movements: Extraocular movements intact.  Cardiovascular:     Rate and Rhythm: Normal rate and regular rhythm.  Pulmonary:     Effort: Pulmonary effort is normal. No respiratory distress.     Breath sounds: Normal breath sounds. No wheezing.  Abdominal:     General: Bowel sounds are normal. There is no distension.     Palpations: Abdomen is soft.     Tenderness: There is no abdominal tenderness.     Comments: Urostomy bag noted with yellow urine and less sediment  Musculoskeletal:        General: Normal range of motion.     Cervical back: Normal range of motion and neck supple.  Skin:    General: Skin is warm and dry.     Coloration: Pallor: -.  Neurological:     General: No focal deficit present.     Mental Status: He is alert.  Psychiatric:        Mood and Affect: Mood normal.        Behavior: Behavior normal.      Consultants:  Urology  Procedures:    Data Reviewed: Results for orders placed or performed during  the hospital encounter of 07/22/23 (from the past 24 hour(s))  Protime-INR     Status: Abnormal   Collection Time: 07/23/23  3:50 AM  Result Value Ref Range   Prothrombin Time 15.4 (H) 11.4 - 15.2 seconds   INR 1.2 0.8 - 1.2  Cortisol-am, blood     Status: None   Collection Time: 07/23/23  3:50 AM  Result Value Ref Range   Cortisol - AM 9.7 6.7 - 22.6 ug/dL  Procalcitonin     Status: None   Collection Time: 07/23/23  3:50 AM  Result Value Ref Range   Procalcitonin 0.42 ng/mL  Basic metabolic panel     Status: Abnormal   Collection Time: 07/23/23  3:50 AM  Result Value Ref Range   Sodium 135 135 - 145 mmol/L   Potassium 3.8 3.5 - 5.1 mmol/L   Chloride 109 98 - 111 mmol/L   CO2 19 (L) 22 -  32 mmol/L   Glucose, Bld 125 (H) 70 - 99 mg/dL   BUN 39 (H) 8 - 23 mg/dL   Creatinine, Ser 6.04 (H) 0.61 - 1.24 mg/dL   Calcium 8.3 (L) 8.9 - 10.3 mg/dL   GFR, Estimated 31 (L) >60 mL/min   Anion gap 7 5 - 15  CBC with Differential/Platelet     Status: Abnormal   Collection Time: 07/23/23  3:50 AM  Result Value Ref Range   WBC 14.8 (H) 4.0 - 10.5 K/uL   RBC 3.11 (L) 4.22 - 5.81 MIL/uL   Hemoglobin 9.8 (L) 13.0 - 17.0 g/dL   HCT 54.0 (L) 98.1 - 19.1 %   MCV 97.4 80.0 - 100.0 fL   MCH 31.5 26.0 - 34.0 pg   MCHC 32.3 30.0 - 36.0 g/dL   RDW 47.8 29.5 - 62.1 %   Platelets 139 (L) 150 - 400 K/uL   nRBC 0.0 0.0 - 0.2 %   Neutrophils Relative % 84 %   Neutro Abs 12.5 (H) 1.7 - 7.7 K/uL   Lymphocytes Relative 7 %   Lymphs Abs 1.0 0.7 - 4.0 K/uL   Monocytes Relative 8 %   Monocytes Absolute 1.2 (H) 0.1 - 1.0 K/uL   Eosinophils Relative 0 %   Eosinophils Absolute 0.0 0.0 - 0.5 K/uL   Basophils Relative 0 %   Basophils Absolute 0.1 0.0 - 0.1 K/uL   Immature Granulocytes 1 %   Abs Immature Granulocytes 0.07 0.00 - 0.07 K/uL  Magnesium     Status: None   Collection Time: 07/23/23  3:50 AM  Result Value Ref Range   Magnesium 1.8 1.7 - 2.4 mg/dL    I have reviewed pertinent nursing notes,  vitals, labs, and images as necessary. I have ordered labwork to follow up on as indicated.  I have reviewed the last notes from staff over past 24 hours. I have discussed patient's care plan and test results with nursing staff, CM/SW, and other staff as appropriate.  Time spent: Greater than 50% of the 55 minute visit was spent in counseling/coordination of care for the patient as laid out in the A&P.   LOS: 1 day   Lewie Chamber, MD [facility] 07/23/2023, 2:40 PM

## 2023-07-23 NOTE — Plan of Care (Signed)

## 2023-07-24 ENCOUNTER — Inpatient Hospital Stay (HOSPITAL_COMMUNITY): Payer: Medicare Other

## 2023-07-24 ENCOUNTER — Encounter (HOSPITAL_COMMUNITY): Payer: Self-pay | Admitting: Internal Medicine

## 2023-07-24 DIAGNOSIS — A419 Sepsis, unspecified organism: Secondary | ICD-10-CM | POA: Diagnosis not present

## 2023-07-24 DIAGNOSIS — N390 Urinary tract infection, site not specified: Secondary | ICD-10-CM | POA: Diagnosis not present

## 2023-07-24 LAB — CBC WITH DIFFERENTIAL/PLATELET
Abs Immature Granulocytes: 0.05 10*3/uL (ref 0.00–0.07)
Basophils Absolute: 0.1 10*3/uL (ref 0.0–0.1)
Basophils Relative: 1 %
Eosinophils Absolute: 0.2 10*3/uL (ref 0.0–0.5)
Eosinophils Relative: 2 %
HCT: 26.6 % — ABNORMAL LOW (ref 39.0–52.0)
Hemoglobin: 8.8 g/dL — ABNORMAL LOW (ref 13.0–17.0)
Immature Granulocytes: 1 %
Lymphocytes Relative: 10 %
Lymphs Abs: 0.9 10*3/uL (ref 0.7–4.0)
MCH: 32.2 pg (ref 26.0–34.0)
MCHC: 33.1 g/dL (ref 30.0–36.0)
MCV: 97.4 fL (ref 80.0–100.0)
Monocytes Absolute: 0.7 10*3/uL (ref 0.1–1.0)
Monocytes Relative: 8 %
Neutro Abs: 6.9 10*3/uL (ref 1.7–7.7)
Neutrophils Relative %: 78 %
Platelets: 160 10*3/uL (ref 150–400)
RBC: 2.73 MIL/uL — ABNORMAL LOW (ref 4.22–5.81)
RDW: 13.5 % (ref 11.5–15.5)
WBC: 8.8 10*3/uL (ref 4.0–10.5)
nRBC: 0 % (ref 0.0–0.2)

## 2023-07-24 LAB — BASIC METABOLIC PANEL
Anion gap: 9 (ref 5–15)
BUN: 42 mg/dL — ABNORMAL HIGH (ref 8–23)
CO2: 20 mmol/L — ABNORMAL LOW (ref 22–32)
Calcium: 8.6 mg/dL — ABNORMAL LOW (ref 8.9–10.3)
Chloride: 112 mmol/L — ABNORMAL HIGH (ref 98–111)
Creatinine, Ser: 2.09 mg/dL — ABNORMAL HIGH (ref 0.61–1.24)
GFR, Estimated: 33 mL/min — ABNORMAL LOW (ref >60)
Glucose, Bld: 116 mg/dL — ABNORMAL HIGH (ref 70–99)
Potassium: 4.3 mmol/L (ref 3.5–5.1)
Sodium: 141 mmol/L (ref 135–145)

## 2023-07-24 LAB — URINE CULTURE: Culture: >=100000 — AB

## 2023-07-24 LAB — MAGNESIUM: Magnesium: 2.1 mg/dL (ref 1.7–2.4)

## 2023-07-24 MED ORDER — ACETAMINOPHEN 325 MG PO TABS
650.0000 mg | ORAL_TABLET | Freq: Four times a day (QID) | ORAL | Status: DC | PRN
  Administered 2023-07-24: 650 mg via ORAL
  Filled 2023-07-24: qty 2

## 2023-07-24 MED ORDER — LEVOFLOXACIN 500 MG PO TABS: 250.0000 mg | ORAL_TABLET | ORAL | Status: DC

## 2023-07-24 NOTE — Progress Notes (Signed)
Transition of Care [facility]) - Inpatient Brief Assessment   Patient Details  Name: RYLEN SWINDLER MRN: [id number] Date of Birth: 12/27/1950  Transition of Care [facility]) CM/SW Contact:    Larrie Kass, LCSW Phone Number: 07/24/2023, 11:41 AM   Clinical Narrative: Transition of Care Department [facility]) has reviewed patient and no TOC needs have been identified at this time. We will continue to monitor patient advancement through interdisciplinary progression rounds. If new patient transition needs arise, please place a TOC consult.     Transition of Care Asessment: Insurance and Status: Insurance coverage has been reviewed Patient has primary care physician: Yes Home environment has been reviewed: home with spouce Prior level of function:: mod independent Prior/Current Home Services: No current home services Social Determinants of Health Reivew: SDOH reviewed no interventions necessary Readmission risk has been reviewed: Yes Transition of care needs: no transition of care needs at this time

## 2023-07-24 NOTE — Plan of Care (Signed)
Problem: Nutrition: Goal: Adequate nutrition will be maintained Outcome: Progressing   Problem: Elimination: Goal: Will not experience complications related to bowel motility Outcome: Progressing   Problem: Safety: Goal: Ability to remain free from injury will improve Outcome: Progressing   Problem: Skin Integrity: Goal: Risk for impaired skin integrity will decrease Outcome: Progressing

## 2023-07-24 NOTE — Progress Notes (Signed)
PROGRESS NOTE    Wesley [name]  WJX:[id number] DOB: 27-Jun-1951 DOA: 07/22/2023 PCP: [name], MD   Brief Narrative:  72 yo male with PMH prostate cancer s/p brachytherapy, bladder cancer s/p recent cystectomy and creation of ileal conduit on 05/22/23, recent treatment for UTI presented with fever and weakness.  On presentation, workup showed leukocytosis with possible UTI and CT renal protocol showed new moderate left hydroureteronephrosis and large stool burden.  He was started on IV fluids and antibiotics.  Assessment & Plan:   Sepsis: Present on admission UTI: Present on admission Moderate left hydroureteronephrosis -Currently on IV Levaquin.  Urine culture growing more than 100,000 colonies per mL Klebsiella pneumoniae.  Sensitivities pending. -Current hemodynamically stable -Urology evaluation appreciated: Urology recommended aggressive bowel regimen and reassessing his mild hydronephrosis in a few days.  Outpatient follow-up with urology.  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  Currently at baseline.  Acidosis improving.  Monitor.  Leukocytosis -Resolved  Constipation -Had diarrhea overnight and wants lactulose discontinued.  Continue MiraLAX.    Severe protein calorie malnutrition -Follow nutrition recommendations  Hypertension -Amlodipine on hold for now.  Monitor  Complex partial seizures -Continue Keppra and oxcarbazepine   DVT prophylaxis: Heparin subcutaneous Code Status: Full Family Communication: None at bedside Disposition Plan: Status is: Inpatient Remains inpatient appropriate because: Of severity of illness    Consultants: Urology  Procedures: None  Antimicrobials:  Anti-infectives (From admission, onward)    Start     Dose/Rate Route Frequency Ordered  Stop   07/23/23 0500  ceFEPIme (MAXIPIME) 2 g in sodium chloride 0.9 % 100 mL IVPB  Status:  Discontinued        2 g 200 mL/hr over 30 Minutes Intravenous Every 24 hours 07/22/23 0638 07/22/23 0924   07/22/23 1000  Levofloxacin (LEVAQUIN) IVPB 250 mg        250 mg 50 mL/hr over 60 Minutes Intravenous Every 48 hours 07/22/23 0929     07/22/23 0445  ceFEPIme (MAXIPIME) 2 g in sodium chloride 0.9 % 100 mL IVPB        2 g 200 mL/hr over 30 Minutes Intravenous  Once 07/22/23 0439 07/22/23 0726   07/22/23 0445  metroNIDAZOLE (FLAGYL) IVPB 500 mg        500 mg 100 mL/hr over 60 Minutes Intravenous  Once 07/22/23 0439 07/22/23 0726   07/22/23 0445  vancomycin (VANCOCIN) IVPB 1000 mg/200 mL premix        1,000 mg 200 mL/hr over 60 Minutes Intravenous  Once 07/22/23 0439 07/22/23 0726        Subjective: Patient seen and examined at bedside.  Feels slightly better.  Had diarrhea overnight.  No fever, vomiting, abdominal pain reported.  Objective: Vitals:   07/23/23 1248 07/23/23 1944 07/24/23 0424 07/24/23 0844  BP: 107/61 131/75 131/74   Pulse: 85 84 77   Resp: 17 18 18    Temp: 99 F (37.2 C) 98.6 F (37 C) (!) 97.5 F (36.4 C)   TempSrc: Oral Oral Oral   SpO2: 98% 97% 97% 100%  Weight:      Height:        Intake/Output Summary (Last 24 hours) at 07/24/2023 0904 Last data filed at 07/24/2023 0240 Gross per 24 hour  Intake 511.29 ml  Output 75 ml  Net 436.29 ml   [employer]  07/22/23 0336  Weight: 45.4 kg    Examination:  General exam: Appears calm and comfortable.  On room air. Respiratory system: Bilateral decreased breath sounds at bases, no wheezing Cardiovascular system: S1 & S2 heard, Rate controlled Gastrointestinal system: Abdomen is nondistended, soft and nontender. Normal bowel sounds heard.  Urostomy bag in place. Extremities: No cyanosis, clubbing, edema  Central nervous system: Alert and oriented. No focal neurological deficits. Moving  extremities Skin: No rashes, lesions or ulcers Psychiatry: Judgement and insight appear normal. Mood & affect appropriate.     Data Reviewed: I have personally reviewed following labs and imaging studies  CBC: Recent Labs  Lab 07/22/23 0408 07/23/23 0350 07/24/23 0408  WBC 30.8* 14.8* 8.8  NEUTROABS 27.6* 12.5* 6.9  HGB 11.1* 9.8* 8.8*  HCT 34.1* 30.3* 26.6*  MCV 97.7 97.4 97.4  PLT 152 139* 160   Basic Metabolic Panel: Recent Labs  Lab 07/22/23 0408 07/23/23 0350 07/24/23 0408  NA 135 135 141  K 5.0 3.8 4.3  CL 111 109 112*  CO2 16* 19* 20*  GLUCOSE 138* 125* 116*  BUN 43* 39* 42*  CREATININE 2.16* 2.20* 2.09*  CALCIUM 8.8* 8.3* 8.6*  MG  --  1.8 2.1   GFR: Estimated Creatinine Clearance: 19.9 mL/min (A) (by C-G formula based on SCr of 2.09 mg/dL (H)). Liver Function Tests: Recent Labs  Lab 07/22/23 0408  AST 32  ALT 15  ALKPHOS 81  BILITOT 1.3*  PROT 7.6  ALBUMIN 3.2*   No results for input(s): "LIPASE", "AMYLASE" in the last 168 hours. No results for input(s): "AMMONIA" in the last 168 hours. Coagulation Profile: Recent Labs  Lab 07/22/23 0408 07/23/23 0350  INR 1.2 1.2   Cardiac Enzymes: No results for input(s): "CKTOTAL", "CKMB", "CKMBINDEX", "TROPONINI" in the last 168 hours. BNP (last 3 results) No results for input(s): "PROBNP" in the last 8760 hours. HbA1C: No results for input(s): "HGBA1C" in the last 72 hours. CBG: No results for input(s): "GLUCAP" in the last 168 hours. Lipid Profile: No results for input(s): "CHOL", "HDL", "LDLCALC", "TRIG", "CHOLHDL", "LDLDIRECT" in the last 72 hours. Thyroid Function Tests: No results for input(s): "TSH", "T4TOTAL", "FREET4", "T3FREE", "THYROIDAB" in the last 72 hours. Anemia Panel: No results for input(s): "VITAMINB12", "FOLATE", "FERRITIN", "TIBC", "IRON", "RETICCTPCT" in the last 72 hours. Sepsis Labs: Recent Labs  Lab 07/22/23 0418 07/22/23 0656 07/23/23 0350  PROCALCITON  --   --  0.42   LATICACIDVEN 0.9 1.0  --     Recent Results (from the past 240 hour(s))  Blood Culture (routine x 2)     Status: None (Preliminary result)   Collection Time: 07/22/23  4:08 AM   Specimen: BLOOD  Result Value Ref Range Status   Specimen Description   Final    BLOOD BLOOD RIGHT HAND Performed at [facility], [address], [place], [place] [address]    Special Requests   Final    BOTTLES DRAWN AEROBIC AND ANAEROBIC Blood Culture adequate volume Performed at [facility], [address], [place], [place] [address]    Culture   Final    NO GROWTH 2 DAYS Performed at [facility], [address], [place], [place] [address]    Report Status PENDING  Incomplete  Urine Culture     Status: Abnormal (Preliminary result)   Collection Time: 07/22/23  7:38 AM   Specimen: Urine, Random  Result Value Ref Range Status   Specimen Description   Final  URINE, RANDOM Performed at [facility], [address], [place], [place] [address]    Special Requests   Final    NONE Reflexed from [id number] Performed at [facility], [address], [place], [place] [address]    Culture >=100,000 COLONIES/mL KLEBSIELLA PNEUMONIAE (A)  Final   Report Status PENDING  Incomplete         Radiology Studies: No results found.      Scheduled Meds:  doxepin  25 mg Oral QHS   feeding supplement  1 Container Oral TID BM   finasteride  5 mg Oral Daily   fluticasone furoate-vilanterol  1 puff Inhalation Daily   heparin  5,000 Units Subcutaneous Q8H   levETIRAcetam  500 mg Oral TID   OXcarbazepine ER  600 mg Oral Q1500   polyethylene glycol  17 g Oral Daily   primidone  100 mg Oral QHS   senna-docusate  1 tablet Oral BID   Continuous Infusions:  levofloxacin (LEVAQUIN) IV Stopped (07/22/23 1100)          Glade Lloyd, MD [facility] 07/24/2023, 9:04 AM

## 2023-07-24 NOTE — Plan of Care (Signed)

## 2023-07-24 NOTE — Progress Notes (Signed)
IV TO PO Pharmacy Protocol  This patient is receiving the antibiotic(s): levofloxacin by the intravenous route. Based on criteria approved by the Pharmacy and Therapeutics Committee, and the Infectious Disease Division, the antibiotic(s) is / are being converted to equivalent oral dose form(s).   These criteria include:  - Patient being treated for a respiratory tract infection, urinary tract infection,  cellulitis, or Clostridium Difficile Associated Diarrhea  - The patient is not neutropenic and does not exhibit a GI malabsorption state  - The patient is eating (either orally or per tube) and/or has been taking other  orally administered medications for at least 24 hours.  - The patient is improving clinically (physician assessment and a 24-hour Tmax  of <100.5 F).   If you have questions about this conversion, please contact the pharmacy department.   Thank you, Roslyn Smiling, PharmD PGY1 Pharmacy Resident 07/24/2023 11:53 AM

## 2023-07-25 DIAGNOSIS — N390 Urinary tract infection, site not specified: Secondary | ICD-10-CM | POA: Diagnosis not present

## 2023-07-25 DIAGNOSIS — A419 Sepsis, unspecified organism: Secondary | ICD-10-CM | POA: Diagnosis not present

## 2023-07-25 LAB — BASIC METABOLIC PANEL
Anion gap: 7 (ref 5–15)
BUN: 46 mg/dL — ABNORMAL HIGH (ref 8–23)
CO2: 20 mmol/L — ABNORMAL LOW (ref 22–32)
Calcium: 8.4 mg/dL — ABNORMAL LOW (ref 8.9–10.3)
Chloride: 109 mmol/L (ref 98–111)
Creatinine, Ser: 2.1 mg/dL — ABNORMAL HIGH (ref 0.61–1.24)
GFR, Estimated: 32 mL/min — ABNORMAL LOW (ref >60)
Glucose, Bld: 111 mg/dL — ABNORMAL HIGH (ref 70–99)
Potassium: 4.2 mmol/L (ref 3.5–5.1)
Sodium: 136 mmol/L (ref 135–145)

## 2023-07-25 LAB — CBC WITH DIFFERENTIAL/PLATELET
Abs Immature Granulocytes: 0.03 10*3/uL (ref 0.00–0.07)
Basophils Absolute: 0 10*3/uL (ref 0.0–0.1)
Basophils Relative: 1 %
Eosinophils Absolute: 0.1 10*3/uL (ref 0.0–0.5)
Eosinophils Relative: 2 %
HCT: 29.8 % — ABNORMAL LOW (ref 39.0–52.0)
Hemoglobin: 9.6 g/dL — ABNORMAL LOW (ref 13.0–17.0)
Immature Granulocytes: 0 %
Lymphocytes Relative: 13 %
Lymphs Abs: 0.9 10*3/uL (ref 0.7–4.0)
MCH: 31.4 pg (ref 26.0–34.0)
MCHC: 32.2 g/dL (ref 30.0–36.0)
MCV: 97.4 fL (ref 80.0–100.0)
Monocytes Absolute: 0.6 10*3/uL (ref 0.1–1.0)
Monocytes Relative: 9 %
Neutro Abs: 5.3 10*3/uL (ref 1.7–7.7)
Neutrophils Relative %: 75 %
Platelets: 192 10*3/uL (ref 150–400)
RBC: 3.06 MIL/uL — ABNORMAL LOW (ref 4.22–5.81)
RDW: 13.5 % (ref 11.5–15.5)
WBC: 7.1 10*3/uL (ref 4.0–10.5)
nRBC: 0 % (ref 0.0–0.2)

## 2023-07-25 LAB — MAGNESIUM: Magnesium: 2.1 mg/dL (ref 1.7–2.4)

## 2023-07-25 MED ORDER — POLYETHYLENE GLYCOL 3350 17 G PO PACK: 17.0000 g | PACK | Freq: Two times a day (BID) | ORAL | 0 refills | Status: DC

## 2023-07-25 MED ORDER — LACTULOSE 20 GM/30ML PO SOLN: 10.0000 g | Freq: Two times a day (BID) | ORAL | 0 refills | Status: DC | PRN

## 2023-07-25 MED ORDER — SENNOSIDES-DOCUSATE SODIUM 8.6-50 MG PO TABS: 1.0000 | ORAL_TABLET | Freq: Two times a day (BID) | ORAL | 0 refills | Status: DC

## 2023-07-25 MED ORDER — CEPHALEXIN 250 MG PO CAPS: 250.0000 mg | ORAL_CAPSULE | Freq: Three times a day (TID) | ORAL | 0 refills | Status: AC

## 2023-07-25 NOTE — Plan of Care (Signed)
Problem: Nutrition: Goal: Adequate nutrition will be maintained Outcome: Progressing   Problem: Elimination: Goal: Will not experience complications related to bowel motility Outcome: Progressing   Problem: Pain Managment: Goal: General experience of comfort will improve Outcome: Progressing   Problem: Safety: Goal: Ability to remain free from injury will improve Outcome: Progressing

## 2023-07-25 NOTE — Progress Notes (Signed)
     Subjective: Patient was in good spirits this morning and joking as usual.  We reviewed his case and plan and all questions were answered to his satisfaction.  Objective: Vital signs in last 24 hours: Temp:  [98 F (36.7 C)-99 F (37.2 C)] 98.7 F (37.1 C) (10/17 0347) Pulse Rate:  [80-90] 82 (10/17 0347) Resp:  [16-17] 16 (10/17 0347) BP: (128-148)/(74-80) 141/77 (10/17 0347) SpO2:  [96 %-100 %] 96 % (10/17 0347)  Assessment/Plan: #left hydronephrosis #constipation  S/p cystectomy  Renal ultrasound collected 08/24/2023 shows relatively stable moderate to severe left-sided hydronephrosis.  His renal function is preserved and the shared decision was made not to proceed with a percutaneous nephrostomy tube at this time.    He has not made very much progress on his constipation.  We discussed an aggressive bowel regiment to really clean him out, and then to reengage with his GI provider to make sure that he is having daily bowel movements.   He is not taking any opiates at this time and already experiencing this much difficulty with constipation. As his disease state progresses and he transitions into palliation, this is likely to worsen severely.   Ok to discharge from a urologic perspective.   Case and plan discussed with Dr. Berneice Heinrich and primary team  Intake/Output from previous day: 10/16 0701 - 10/17 0700 In: -  Out: 350 [Urine:350]  Intake/Output this shift: No intake/output data recorded.  Physical Exam:  General: Alert and oriented CV: No cyanosis Lungs: equal chest rise   Lab Results: Recent Labs    07/23/23 0350 07/24/23 0408 07/25/23 0412  HGB 9.8* 8.8* 9.6*  HCT 30.3* 26.6* 29.8*   BMET Recent Labs    07/24/23 0408 07/25/23 0412  NA 141 136  K 4.3 4.2  CL 112* 109  CO2 20* 20*  GLUCOSE 116* 111*  BUN 42* 46*  CREATININE 2.09* 2.10*  CALCIUM 8.6* 8.4*     Studies/Results: US RENAL  Result Date: 07/24/2023 CLINICAL DATA:  Hydronephrosis  EXAM: RENAL / URINARY TRACT ULTRASOUND COMPLETE COMPARISON:  CT 07/22/2023 FINDINGS: Right Kidney: Renal measurements: 6 x 4.5 x 4.9 cm = volume: 69.6 mL. Cortex is echogenic. Kidney is atrophic. No hydronephrosis. Cyst at the mid to upper pole measuring 2.6 cm, no imaging follow-up is recommended Left Kidney: Renal measurements: 12.3 x 6.3 x 7.1 cm = volume: 287.4 mL. Cortex is echogenic. Moderate to severe left hydronephrosis. Cysts, fewer than 10. The largest is seen at the midpole and measures 7.2 cm, no imaging follow-up recommended for this finding. Bladder: History of cystectomy Other: None. IMPRESSION: 1. At least moderate left hydronephrosis, probably not significantly changed compared to CT 2. Echogenic kidneys with atrophy on the right Electronically Signed   By: Jasmine Pang M.D.   On: 07/24/2023 22:41      LOS: 3 days   Elmon Kirschner, NP [facility] Pager: [phone]  07/25/2023, 8:43 AM

## 2023-07-25 NOTE — Discharge Summary (Addendum)
Physician Discharge Summary  Wesley FLAM AOZ:[id number] DOB: November 26, 1950 DOA: 07/22/2023  PCP: [name], MD  Admit date: 07/22/2023 Discharge date: 07/25/2023  Admitted From: Home Disposition: Home  Recommendations for Outpatient Follow-up:  Follow up with PCP in 1 week with repeat CBC/BMP Outpatient follow-up with urology Follow up in ED if symptoms worsen or new appear   Home Health: No Equipment/Devices: None  Discharge Condition: Stable CODE STATUS: Full Diet recommendation: Heart healthy  Brief/Interim Summary: 72 yo male with PMH prostate cancer s/p brachytherapy, bladder cancer s/p recent cystectomy and creation of ileal conduit on 05/22/23, recent treatment for UTI presented with fever and weakness.  On presentation, workup showed leukocytosis with possible UTI and CT renal protocol showed new moderate left hydroureteronephrosis and large stool burden.  He was started on IV fluids and antibiotics.  Urine cultures growing Klebsiella pneumoniae.  Urology following and did a renal ultrasound on 08/24/2023 which showed relatively stable moderate to severe left sided hydronephrosis.  Urology wants to follow him as an outpatient and has cleared the patient for discharge.  He will be discharged home on oral antibiotics today.  Outpatient follow-up with PCP and urology.  Discharge Diagnoses:   Sepsis: Present on admission; due to ampicillin resistant Klebsiella pneumoniae UTI; unclear if it is related to urostomy UTI: Present on admission Moderate left hydroureteronephrosis -Currently on IV Levaquin.  Urine culture growing more than 100,000 colonies per mL Klebsiella pneumoniae.  Sensitivities are currently available. -Current hemodynamically stable.  Sepsis has resolved. - Urology following and did a renal ultrasound on 08/24/2023 which showed relatively stable moderate to severe left sided hydronephrosis.  Urology wants to follow him as an outpatient and has cleared the  patient for discharge.  He will be discharged home on oral Keflex today.  Outpatient follow-up with PCP and urology.   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  Currently at baseline.  Acidosis improving.  Outpatient follow-up.   Leukocytosis -Resolved   Constipation -Urology recommending aggressive treatment of constipation.  Will discharge on MiraLAX twice a day along with Senokot twice a day and lactulose as needed.  Outpatient follow-up with his regular GI.    History of ulcerative colitis -Outpatient follow-up with regular GI for resumption of Stelara.  Severe protein calorie malnutrition -Follow nutrition recommendations   Hypertension -Amlodipine to be resumed on discharge.  Losartan to remain on hold till reevaluation with PCP.  Blood pressure on the lower side intermittently.   Complex partial seizures -Continue Keppra and oxcarbazepine  Discharge Instructions  Discharge Instructions     Diet general   Complete by: As directed    Increase activity slowly   Complete by: As directed       Allergies as of 07/25/2023       Reactions   Sulfa Antibiotics Rash   Childhood        Medication List     STOP taking these medications    losartan 50 MG tablet Commonly known as: COZAAR       TAKE these medications    amLODipine 5 MG tablet Commonly known as: NORVASC Take 5 mg by mouth at bedtime.   Biotin 5000 5 MG Caps Generic drug: Biotin Take 5 mg by mouth daily.   Breo Ellipta 100-25 MCG/ACT Aepb Generic drug: fluticasone furoate-vilanterol INHALE 1 PUFF BY MOUTH EVERY  DAY What changed: See the new instructions.   celecoxib 100 MG capsule Commonly known as: CELEBREX Take 100 mg by mouth every evening.   cephALEXin 250 MG capsule Commonly known as: KEFLEX Take 1  capsule (250 mg total) by mouth 3 (three) times daily for 7 days.   doxepin 25 MG capsule Commonly known as: SINEQUAN Take 25 mg by mouth at bedtime.   ferrous sulfate 325 (65 FE) MG tablet Take 325 mg by mouth daily.   finasteride 5 MG tablet Commonly known as: PROSCAR Take 5 mg by mouth daily.   ketoconazole 2 % shampoo Commonly known as: NIZORAL Apply 1 Application topically daily as needed (yeast).   ketoconazole 2 % cream Commonly known as: NIZORAL Apply 1 Application topically daily as needed for irritation.   Lactulose 20 GM/30ML Soln Take 15 mLs (10 g total) by mouth 2 (two) times daily as needed.   levETIRAcetam 500 MG tablet Commonly known as: KEPPRA Take 500 mg by mouth 3 (three) times daily.   Oxtellar XR 600 MG Tb24 Generic drug: OXcarbazepine ER Take 600 mg by mouth daily in the afternoon.   polyethylene glycol 17 g packet Commonly known as: MIRALAX / GLYCOLAX Take 17 g by mouth 2 (two) times daily.   primidone 50 MG tablet Commonly known as: MYSOLINE Take 100 mg by mouth at bedtime.   senna-docusate 8.6-50 MG tablet Commonly known as: Senokot-S Take 1 tablet by mouth 2 (two) times daily.   ustekinumab 90 MG/ML Sosy injection Commonly known as: STELARA Inject 90 mg into the skin every 8 (eight) weeks.        Follow-up Information     [name], MD. Schedule an appointment as soon as possible for a visit in 1 week(s).   Specialty: Internal Medicine Contact information: [address] [place] [address] [phone]         Loletta Parish., MD. Schedule an appointment as soon as possible for a visit in 1 week(s).   Specialty: Urology Contact information: [address] [place] [address] [phone]                Allergies  Allergen Reactions   Sulfa Antibiotics Rash    Childhood    Consultations: Urology   Procedures/Studies: US RENAL  Result Date: 07/24/2023 CLINICAL  DATA:  Hydronephrosis EXAM: RENAL / URINARY TRACT ULTRASOUND COMPLETE COMPARISON:  CT 07/22/2023 FINDINGS: Right Kidney: Renal measurements: 6 x 4.5 x 4.9 cm = volume: 69.6 mL. Cortex is echogenic. Kidney is atrophic. No hydronephrosis. Cyst at the mid to upper pole measuring 2.6 cm, no imaging follow-up is recommended Left Kidney: Renal measurements: 12.3 x 6.3 x 7.1 cm = volume: 287.4 mL. Cortex is echogenic. Moderate to severe left hydronephrosis. Cysts, fewer than 10. The largest is seen at the midpole and measures 7.2 cm, no imaging follow-up recommended for this finding. Bladder: History of cystectomy Other: None. IMPRESSION: 1. At least moderate left hydronephrosis, probably not significantly changed compared to CT 2. Echogenic kidneys with atrophy on the right Electronically Signed   By: Jasmine Pang M.D.   On: 07/24/2023 22:41   CT Renal Stone Study  Result Date: 07/22/2023 CLINICAL DATA:  72 year old male with possible sepsis. UTI, fevers and chills. Weakness. Prostate cancer. EXAM: CT ABDOMEN AND PELVIS WITHOUT CONTRAST TECHNIQUE: Multidetector CT imaging of the abdomen and pelvis was performed following the standard protocol without IV contrast. RADIATION DOSE REDUCTION: This exam was performed  according to the departmental dose-optimization program which includes automated exposure control, adjustment of the mA and/or kV according to patient size and/or use of iterative reconstruction technique. COMPARISON:  Recent outpatient Chest CT 07/19/2023 reported separately. Previous noncontrast CT Abdomen and Pelvis 01/11/2023. FINDINGS: Lower chest: No change from the chest CT 07/19/2023 (please see that report) Hepatobiliary: Larger gallbladder but otherwise negative noncontrast liver and gallbladder. No pericholecystic inflammation identified. Pancreas: Negative. Spleen: Negative. Adrenals/Urinary Tract: Negative adrenal glands. Right renal atrophy and nephrolithiasis. No right hydronephrosis or  hydroureter following removal of ureteral stents demonstrated on chest CTA last month. Exophytic and simple fluid density left upper pole renal cyst is stable and appears benign (no follow-up imaging recommended). Left renal hydronephrosis and hydroureter are progressed following ureteral stent removal when compared to 06/25/2023. The proximal left ureter is dilated in the abdomen (series 2, image 44) but is difficult to delineate distally. And urinary bladder now is surgically absent with multiple pelvic surgical clips and right abdominal ileostomy. The ileostomy loop is nondilated. But small bowel detail is limited due to lack of contrast and visceral fat. Stomach/Bowel: Abundant retained stool in redundant large bowel. New right abdominal ileostomy, probably an ileal conduit. No free air or free fluid identified. No dilated small bowel. Vascular/Lymphatic: Vascular patency is not evaluated in the absence of IV contrast. Normal caliber abdominal aorta. Aortoiliac calcified atherosclerosis. No lymphadenopathy identified in the absence of contrast. Reproductive: Stable prostate brachytherapy. Other: No pelvis free fluid. Musculoskeletal: Stable visualized osseous structures. Widespread spine degeneration. Advanced hip degeneration, severe on the left. No acute or suspicious osseous finding identified. IMPRESSION: 1. Status post cystectomy and right abdominal ileal conduit. New/increased and Moderate Left hydronephrosis and hydroureter following removal of ureteral stents which were visible on CTA Chest 06/25/2023. Stable right renal atrophy and nephrolithiasis. 2. No dilated small bowel, and ileostomy loop also appears decompressed. Abundant retained stool in redundant large bowel. 3. Abnormal lung bases, see details on Chest CT 07/19/2023 reported this morning. 4. No other acute or inflammatory process in the noncontrast abdomen or pelvis. Aortic Atherosclerosis (ICD10-I70.0). Electronically Signed   By: Odessa Fleming  M.D.   On: 07/22/2023 06:46   CT Chest Wo Contrast  Result Date: 07/22/2023 CLINICAL DATA:  72 year old male with possible sepsis. UTI, fevers and chills. Weakness. Prostate cancer. EXAM: CT CHEST WITHOUT CONTRAST TECHNIQUE: Multidetector CT imaging of the chest was performed following the standard protocol without IV contrast. RADIATION DOSE REDUCTION: This exam was performed according to the departmental dose-optimization program which includes automated exposure control, adjustment of the mA and/or kV according to patient size and/or use of iterative reconstruction technique. COMPARISON:  Chest CT 06/25/2023. FINDINGS: Cardiovascular: Extensive calcified coronary artery atherosclerosis and/or stents. Heart size remains normal. No pericardial effusion. Vascular patency is not evaluated in the absence of IV contrast. Mediastinum/Nodes: No mediastinal mass or lymphadenopathy is evident in the absence of IV contrast. Lungs/Pleura: Unchanged small layering right pleural effusion since last month. Major airways remain patent. Platelike, rounded opacity in the right lower lobe abutting the right pleural fluid appears unchanged, and superimposed on chronic peribronchial and pleural scarring seen in January this year. Superimposed stable extensive nodularity along the right major and minor fissures. The largest is 12 mm on series 5, image 81. And similar streaky opacity in both middle lobes has not significantly changed. No new pulmonary nodule since last month.  No new pulmonary opacity. Upper Abdomen: Visible noncontrast liver, spleen, pancreas, adrenal glands remain within normal limits. Ongoing abundant  retained stool in the upper abdominal large bowel. Exophytic and large left renal cyst redemonstrated. Evidence of increased size of the left renal collecting system on series 2, image 161, proximal left ureter also appears increased, and previously seen ureteral stent there has been removed. Contralateral right  renal atrophy appears stable. Right ureteral stent also removed. No free air or free fluid in the visible upper abdomen. Musculoskeletal: Stable visualized osseous structures. No suspicious bone lesion. A small sclerotic focus in the sternum is stable since January. Chronic lower thoracic spine degeneration and endplate irregularity. IMPRESSION: 1. Evidence of increased left hydronephrosis and hydroureter following ureteral stent removal since last month. CT Abdomen and Pelvis today is reported separately. 2. Unchanged indeterminate lung nodularity and opacity since the Chest CT last month, more pronounced on the right. Unchanged small layering right pleural effusion. The findings are substantially new compared to a CT in January this year, and although they might be postinflammatory, continued CT surveillance in this cancer patient is recommended. 3. No new abnormality in the noncontrast Chest. Aortic Atherosclerosis (ICD10-I70.0). Electronically Signed   By: Odessa Fleming M.D.   On: 07/22/2023 06:35   DG Chest Port 1 View  Result Date: 07/22/2023 CLINICAL DATA:  72 year old male with possible sepsis. UTI, fevers and chills. EXAM: PORTABLE CHEST 1 VIEW COMPARISON:  Portable chest 05/28/2023 and earlier. FINDINGS: Portable AP semi upright view at 0444 hours. Mildly lower lung volumes. Allowing for portable technique the lungs are clear. Calcified aortic atherosclerosis. Normal cardiac size and mediastinal contours. Visualized tracheal air column is within normal limits. No pneumothorax or pleural effusion. Negative visible bowel gas. No acute osseous abnormality identified. IMPRESSION: No acute cardiopulmonary abnormality. Electronically Signed   By: Odessa Fleming M.D.   On: 07/22/2023 06:20   CT Angio Chest Pulmonary Embolism (PE) W or WO Contrast  Result Date: 06/25/2023 CLINICAL DATA:  Pleuritic chest pain, recent abdominal surgery, shortness of breath, history of prostate cancer EXAM: CT ANGIOGRAPHY CHEST WITH  CONTRAST TECHNIQUE: Multidetector CT imaging of the chest was performed using the standard protocol during bolus administration of intravenous contrast. Multiplanar CT image reconstructions and MIPs were obtained to evaluate the vascular anatomy. RADIATION DOSE REDUCTION: This exam was performed according to the departmental dose-optimization program which includes automated exposure control, adjustment of the mA and/or kV according to patient size and/or use of iterative reconstruction technique. CONTRAST:  60mL ISOVUE-370 IOPAMIDOL (ISOVUE-370) INJECTION 76% COMPARISON:  05/28/2023, 10/11/2022, 01/11/2023 FINDINGS: Cardiovascular: This is a technically adequate evaluation of the pulmonary vasculature. No filling defects or pulmonary emboli. The heart is unremarkable without pericardial effusion. No evidence of thoracic aortic aneurysm or dissection. Atherosclerosis of the aorta and coronary vasculature unchanged. Mediastinum/Nodes: No enlarged mediastinal, hilar, or axillary lymph nodes. Thyroid gland, trachea, and esophagus demonstrate no significant findings. Lungs/Pleura: Since the prior exams, nodular areas of consolidation have developed within the right lower lobe. A slightly spiculated subpleural right lower lobe nodule is identified reference image 73/11, measuring 15 x 8 by 9 mm. Peripheral nodular consolidation within the posterior costophrenic angle right lower lobe measures up to 19 x 16 x 14 mm, reference image 106/11. Small areas of nodular pleural thickening of also developed along the right major fissure, measuring up to 4 mm reference image 88/11. Linear areas of consolidation within the right middle lobe and lingula likely reflects subsegmental atelectasis. No other areas of acute airspace disease. Central airways are patent. Upper Abdomen: There are bilateral ureteral stents identified, incompletely visualized due to collimation.  Resolution of the bilateral hydronephrosis seen on prior abdominal  CT. Stable right lower pole renal calculi measuring up to 8 mm. Musculoskeletal: No acute or destructive bony abnormalities. Reconstructed images demonstrate no additional findings. Review of the MIP images confirms the above findings. IMPRESSION: 1. No evidence of pulmonary embolus. 2. New areas of nodular consolidation within the right lower lobe, nodular pleural thickening along the right major fissure, and trace right pleural effusion as above. This could reflect underlying infection given rapid development since prior imaging, though metastatic disease cannot be excluded in this patient with a known history of malignancy. Close CT follow-up is recommended to assess response to treatment and ensure resolution. 3. Interval placement of bilateral ureteral stents, with resolution of the hydronephrosis seen previously. 4. Stable nonobstructing right renal calculus. 5. Aortic Atherosclerosis (ICD10-I70.0). Coronary artery atherosclerosis. Electronically Signed   By: Sharlet Salina M.D.   On: 06/25/2023 17:09      Subjective: Patient seen and examined at bedside.  Feels okay to go home today.  No fever, vomiting, chest pain or shortness of breath reported  Discharge Exam: Vitals:   07/24/23 1939 07/25/23 0347  BP: 128/74 (!) 141/77  Pulse: 90 82  Resp: 17 16  Temp: 99 F (37.2 C) 98.7 F (37.1 C)  SpO2: 97% 96%    General: Pt is alert, awake, not in acute distress.  On room air.  Looks chronically ill and deconditioned. Cardiovascular: rate controlled, S1/S2 + Respiratory: bilateral decreased breath sounds at bases Abdominal: Soft, NT, ND, bowel sounds +.  Urostomy bag in place. Extremities: no edema, no cyanosis    The results of significant diagnostics from this hospitalization (including imaging, microbiology, ancillary and laboratory) are listed below for reference.     Microbiology: Recent Results (from the past 240 hour(s))  Blood Culture (routine x 2)     Status: None  (Preliminary result)   Collection Time: 07/22/23  4:08 AM   Specimen: BLOOD  Result Value Ref Range Status   Specimen Description   Final    BLOOD BLOOD RIGHT HAND Performed at [facility], [address] [place], [place] [address]    Special Requests   Final    BOTTLES DRAWN AEROBIC AND ANAEROBIC Blood Culture adequate volume Performed at [facility], [address], [place], [place] [address]    Culture   Final    NO GROWTH 3 DAYS Performed at [facility], [address], [place], [place] [address]    Report Status PENDING  Incomplete  Urine Culture     Status: Abnormal   Collection Time: 07/22/23  7:38 AM   Specimen: Urine, Random  Result Value Ref Range Status   Specimen Description   Final    URINE, RANDOM Performed at [facility], [address] [place], [place] [address]    Special Requests   Final    NONE Reflexed from [id number] Performed at [facility], [address], [place], [place] [address]    Culture >=100,000 COLONIES/mL KLEBSIELLA PNEUMONIAE (A)  Final   Report Status 07/24/2023 FINAL  Final   Organism ID, Bacteria KLEBSIELLA PNEUMONIAE (A)  Final      Susceptibility   Klebsiella pneumoniae - MIC*    AMPICILLIN >=32 RESISTANT Resistant     CEFAZOLIN <=4 SENSITIVE Sensitive     CEFEPIME <=0.12 SENSITIVE Sensitive     CEFTRIAXONE <=0.25 SENSITIVE Sensitive     CIPROFLOXACIN <=0.25 SENSITIVE Sensitive  GENTAMICIN <=1 SENSITIVE Sensitive     IMIPENEM <=0.25 SENSITIVE Sensitive     NITROFURANTOIN 128 RESISTANT Resistant     TRIMETH/SULFA <=20 SENSITIVE Sensitive     AMPICILLIN/SULBACTAM >=32 RESISTANT Resistant     PIP/TAZO 16 SENSITIVE Sensitive ug/mL    * >=100,000 COLONIES/mL KLEBSIELLA PNEUMONIAE     Labs: BNP (last 3 results) No results for input(s): "BNP" in the last 8760 hours. Basic Metabolic Panel: Recent Labs  Lab 07/22/23 0408 07/23/23 0350  07/24/23 0408 07/25/23 0412  NA 135 135 141 136  K 5.0 3.8 4.3 4.2  CL 111 109 112* 109  CO2 16* 19* 20* 20*  GLUCOSE 138* 125* 116* 111*  BUN 43* 39* 42* 46*  CREATININE 2.16* 2.20* 2.09* 2.10*  CALCIUM 8.8* 8.3* 8.6* 8.4*  MG  --  1.8 2.1 2.1   Liver Function Tests: Recent Labs  Lab 07/22/23 0408  AST 32  ALT 15  ALKPHOS 81  BILITOT 1.3*  PROT 7.6  ALBUMIN 3.2*   No results for input(s): "LIPASE", "AMYLASE" in the last 168 hours. No results for input(s): "AMMONIA" in the last 168 hours. CBC: Recent Labs  Lab 07/22/23 0408 07/23/23 0350 07/24/23 0408 07/25/23 0412  WBC 30.8* 14.8* 8.8 7.1  NEUTROABS 27.6* 12.5* 6.9 5.3  HGB 11.1* 9.8* 8.8* 9.6*  HCT 34.1* 30.3* 26.6* 29.8*  MCV 97.7 97.4 97.4 97.4  PLT 152 139* 160 192   Cardiac Enzymes: No results for input(s): "CKTOTAL", "CKMB", "CKMBINDEX", "TROPONINI" in the last 168 hours. BNP: Invalid input(s): "POCBNP" CBG: No results for input(s): "GLUCAP" in the last 168 hours. D-Dimer No results for input(s): "DDIMER" in the last 72 hours. Hgb A1c No results for input(s): "HGBA1C" in the last 72 hours. Lipid Profile No results for input(s): "CHOL", "HDL", "LDLCALC", "TRIG", "CHOLHDL", "LDLDIRECT" in the last 72 hours. Thyroid function studies No results for input(s): "TSH", "T4TOTAL", "T3FREE", "THYROIDAB" in the last 72 hours.  Invalid input(s): "FREET3" Anemia work up No results for input(s): "VITAMINB12", "FOLATE", "FERRITIN", "TIBC", "IRON", "RETICCTPCT" in the last 72 hours. Urinalysis    Component Value Date/Time   COLORURINE YELLOW 07/22/2023 0738   APPEARANCEUR TURBID (A) 07/22/2023 0738   LABSPEC 1.013 07/22/2023 0738   PHURINE 5.0 07/22/2023 0738   GLUCOSEU NEGATIVE 07/22/2023 0738   HGBUR MODERATE (A) 07/22/2023 0738   BILIRUBINUR NEGATIVE 07/22/2023 0738   KETONESUR NEGATIVE 07/22/2023 0738   PROTEINUR 100 (A) 07/22/2023 0738   UROBILINOGEN 1.0 03/01/2012 1629   NITRITE POSITIVE (A)  07/22/2023 0738   LEUKOCYTESUR LARGE (A) 07/22/2023 0738   Sepsis Labs Recent Labs  Lab 07/22/23 0408 07/23/23 0350 07/24/23 0408 07/25/23 0412  WBC 30.8* 14.8* 8.8 7.1   Microbiology Recent Results (from the past 240 hour(s))  Blood Culture (routine x 2)     Status: None (Preliminary result)   Collection Time: 07/22/23  4:08 AM   Specimen: BLOOD  Result Value Ref Range Status   Specimen Description   Final    BLOOD BLOOD RIGHT HAND Performed at [facility], [address], [place], [place] [address]    Special Requests   Final    BOTTLES DRAWN AEROBIC AND ANAEROBIC Blood Culture adequate volume Performed at [facility], [address] [place], [place] [address]    Culture   Final    NO GROWTH 3 DAYS Performed at [facility], [address] [place], [place] [address]    Report Status PENDING  Incomplete  Urine Culture     Status: Abnormal   Collection Time: 07/22/23  7:38 AM   Specimen: Urine, Random  Result Value Ref Range Status   Specimen Description   Final    URINE, RANDOM Performed at [facility], [address], [place], [place] [address]    Special Requests   Final    NONE Reflexed from [id number] Performed at [facility], [address], [place], [place] [address]    Culture >=100,000 COLONIES/mL KLEBSIELLA PNEUMONIAE (A)  Final   Report Status 07/24/2023 FINAL  Final   Organism ID, Bacteria KLEBSIELLA PNEUMONIAE (A)  Final      Susceptibility   Klebsiella pneumoniae - MIC*    AMPICILLIN >=32 RESISTANT Resistant     CEFAZOLIN <=4 SENSITIVE Sensitive     CEFEPIME <=0.12 SENSITIVE Sensitive     CEFTRIAXONE <=0.25 SENSITIVE Sensitive     CIPROFLOXACIN <=0.25 SENSITIVE Sensitive     GENTAMICIN <=1 SENSITIVE Sensitive     IMIPENEM <=0.25 SENSITIVE Sensitive     NITROFURANTOIN 128 RESISTANT Resistant     TRIMETH/SULFA <=20 SENSITIVE Sensitive     AMPICILLIN/SULBACTAM >=32  RESISTANT Resistant     PIP/TAZO 16 SENSITIVE Sensitive ug/mL    * >=100,000 COLONIES/mL KLEBSIELLA PNEUMONIAE     Time coordinating discharge: 35 minutes  SIGNED:   Glade Lloyd, MD  [facility] 07/25/2023, 11:09 AM

## 2023-07-27 LAB — CULTURE, BLOOD (ROUTINE X 2)
Culture: NO GROWTH
Special Requests: ADEQUATE

## 2023-07-31 NOTE — Plan of Care (Signed)
[facility] Tonsillectomy/Adenoidectomy, Postoperative PEDS care plan entered in error.

## 2023-08-01 DIAGNOSIS — M6281 Muscle weakness (generalized): Secondary | ICD-10-CM | POA: Diagnosis not present

## 2023-08-01 DIAGNOSIS — Z7409 Other reduced mobility: Secondary | ICD-10-CM | POA: Diagnosis not present

## 2023-08-08 DIAGNOSIS — M6281 Muscle weakness (generalized): Secondary | ICD-10-CM | POA: Diagnosis not present

## 2023-08-08 DIAGNOSIS — Z7409 Other reduced mobility: Secondary | ICD-10-CM | POA: Diagnosis not present

## 2023-08-12 DIAGNOSIS — R8271 Bacteriuria: Secondary | ICD-10-CM | POA: Diagnosis not present

## 2023-08-12 DIAGNOSIS — N10 Acute tubulo-interstitial nephritis: Secondary | ICD-10-CM | POA: Diagnosis not present

## 2023-08-15 DIAGNOSIS — Z936 Other artificial openings of urinary tract status: Secondary | ICD-10-CM | POA: Diagnosis not present

## 2023-08-15 DIAGNOSIS — Z8551 Personal history of malignant neoplasm of bladder: Secondary | ICD-10-CM | POA: Diagnosis not present

## 2023-08-15 DIAGNOSIS — Z932 Ileostomy status: Secondary | ICD-10-CM | POA: Diagnosis not present

## 2023-08-21 DIAGNOSIS — G5601 Carpal tunnel syndrome, right upper limb: Secondary | ICD-10-CM | POA: Diagnosis not present

## 2023-08-21 DIAGNOSIS — G40209 Localization-related (focal) (partial) symptomatic epilepsy and epileptic syndromes with complex partial seizures, not intractable, without status epilepticus: Secondary | ICD-10-CM | POA: Diagnosis not present

## 2023-08-22 DIAGNOSIS — R5383 Other fatigue: Secondary | ICD-10-CM | POA: Diagnosis not present

## 2023-08-22 DIAGNOSIS — N3001 Acute cystitis with hematuria: Secondary | ICD-10-CM | POA: Diagnosis not present

## 2023-08-22 DIAGNOSIS — D649 Anemia, unspecified: Secondary | ICD-10-CM | POA: Diagnosis not present

## 2023-08-22 DIAGNOSIS — M6281 Muscle weakness (generalized): Secondary | ICD-10-CM | POA: Diagnosis not present

## 2023-08-22 DIAGNOSIS — Z7409 Other reduced mobility: Secondary | ICD-10-CM | POA: Diagnosis not present

## 2023-08-22 DIAGNOSIS — N1330 Unspecified hydronephrosis: Secondary | ICD-10-CM | POA: Diagnosis not present

## 2023-08-29 ENCOUNTER — Encounter (HOSPITAL_COMMUNITY): Payer: Self-pay

## 2023-08-29 ENCOUNTER — Other Ambulatory Visit: Payer: Self-pay

## 2023-08-29 ENCOUNTER — Emergency Department (HOSPITAL_COMMUNITY)
Admission: EM | Admit: 2023-08-29 | Discharge: 2023-08-29 | Disposition: A | Discharge status: Home / Self Care | Payer: Medicare Other | Attending: Emergency Medicine | Admitting: Emergency Medicine

## 2023-08-29 ENCOUNTER — Emergency Department (HOSPITAL_COMMUNITY): Payer: Medicare Other

## 2023-08-29 DIAGNOSIS — W19XXXA Unspecified fall, initial encounter: Secondary | ICD-10-CM | POA: Insufficient documentation

## 2023-08-29 DIAGNOSIS — R9089 Other abnormal findings on diagnostic imaging of central nervous system: Secondary | ICD-10-CM | POA: Diagnosis not present

## 2023-08-29 DIAGNOSIS — Z8546 Personal history of malignant neoplasm of prostate: Secondary | ICD-10-CM | POA: Insufficient documentation

## 2023-08-29 DIAGNOSIS — H532 Diplopia: Secondary | ICD-10-CM | POA: Insufficient documentation

## 2023-08-29 DIAGNOSIS — M19042 Primary osteoarthritis, left hand: Secondary | ICD-10-CM | POA: Diagnosis not present

## 2023-08-29 DIAGNOSIS — M79642 Pain in left hand: Secondary | ICD-10-CM | POA: Diagnosis not present

## 2023-08-29 DIAGNOSIS — S199XXA Unspecified injury of neck, initial encounter: Secondary | ICD-10-CM | POA: Diagnosis not present

## 2023-08-29 DIAGNOSIS — Z79899 Other long term (current) drug therapy: Secondary | ICD-10-CM | POA: Diagnosis not present

## 2023-08-29 DIAGNOSIS — R42 Dizziness and giddiness: Secondary | ICD-10-CM | POA: Insufficient documentation

## 2023-08-29 DIAGNOSIS — M6281 Muscle weakness (generalized): Secondary | ICD-10-CM | POA: Diagnosis not present

## 2023-08-29 DIAGNOSIS — I10 Essential (primary) hypertension: Secondary | ICD-10-CM | POA: Insufficient documentation

## 2023-08-29 DIAGNOSIS — S0990XA Unspecified injury of head, initial encounter: Secondary | ICD-10-CM | POA: Diagnosis not present

## 2023-08-29 DIAGNOSIS — Z043 Encounter for examination and observation following other accident: Secondary | ICD-10-CM | POA: Diagnosis not present

## 2023-08-29 DIAGNOSIS — G319 Degenerative disease of nervous system, unspecified: Secondary | ICD-10-CM | POA: Diagnosis not present

## 2023-08-29 LAB — URINALYSIS, ROUTINE W REFLEX MICROSCOPIC
Bilirubin Urine: NEGATIVE
Glucose, UA: NEGATIVE mg/dL
Hgb urine dipstick: NEGATIVE
Ketones, ur: NEGATIVE mg/dL
Leukocytes,Ua: NEGATIVE
Nitrite: NEGATIVE
Protein, ur: NEGATIVE mg/dL
Specific Gravity, Urine: 1.011 (ref 1.005–1.030)
pH: 6 (ref 5.0–8.0)

## 2023-08-29 LAB — COMPREHENSIVE METABOLIC PANEL
ALT: 17 U/L (ref 0–44)
AST: 23 U/L (ref 15–41)
Albumin: 3.4 g/dL — ABNORMAL LOW (ref 3.5–5.0)
Alkaline Phosphatase: 108 U/L (ref 38–126)
Anion gap: 11 (ref 5–15)
BUN: 42 mg/dL — ABNORMAL HIGH (ref 8–23)
CO2: 19 mmol/L — ABNORMAL LOW (ref 22–32)
Calcium: 9.4 mg/dL (ref 8.9–10.3)
Chloride: 104 mmol/L (ref 98–111)
Creatinine, Ser: 1.94 mg/dL — ABNORMAL HIGH (ref 0.61–1.24)
GFR, Estimated: 36 mL/min — ABNORMAL LOW (ref >60)
Glucose, Bld: 228 mg/dL — ABNORMAL HIGH (ref 70–99)
Potassium: 4.7 mmol/L (ref 3.5–5.1)
Sodium: 134 mmol/L — ABNORMAL LOW (ref 135–145)
Total Bilirubin: 0.6 mg/dL (ref <1.2)
Total Protein: 8 g/dL (ref 6.5–8.1)

## 2023-08-29 LAB — CBC
HCT: 37.1 % — ABNORMAL LOW (ref 39.0–52.0)
Hemoglobin: 11.8 g/dL — ABNORMAL LOW (ref 13.0–17.0)
MCH: 30.7 pg (ref 26.0–34.0)
MCHC: 31.8 g/dL (ref 30.0–36.0)
MCV: 96.6 fL (ref 80.0–100.0)
Platelets: 371 10*3/uL (ref 150–400)
RBC: 3.84 MIL/uL — ABNORMAL LOW (ref 4.22–5.81)
RDW: 14.9 % (ref 11.5–15.5)
WBC: 11.1 10*3/uL — ABNORMAL HIGH (ref 4.0–10.5)
nRBC: 0 % (ref 0.0–0.2)

## 2023-08-29 MED ORDER — SODIUM CHLORIDE 0.9 % IV BOLUS
1000.0000 mL | Freq: Once | INTRAVENOUS | Status: AC
  Administered 2023-08-29: 1000 mL via INTRAVENOUS

## 2023-08-29 MED ORDER — GADOBUTROL 1 MMOL/ML IV SOLN
4.0000 mL | Freq: Once | INTRAVENOUS | Status: AC | PRN
  Administered 2023-08-29: 4 mL via INTRAVENOUS

## 2023-08-29 MED ORDER — MECLIZINE HCL 25 MG PO TABS: 25.0000 mg | ORAL_TABLET | Freq: Three times a day (TID) | ORAL | 0 refills | Status: DC | PRN

## 2023-08-29 MED ORDER — MECLIZINE HCL 25 MG PO TABS
25.0000 mg | ORAL_TABLET | Freq: Once | ORAL | Status: AC
  Administered 2023-08-29: 25 mg via ORAL
  Filled 2023-08-29: qty 1

## 2023-08-29 MED ORDER — ACETAMINOPHEN 325 MG PO TABS
650.0000 mg | ORAL_TABLET | Freq: Once | ORAL | Status: AC
  Administered 2023-08-29: 650 mg via ORAL
  Filled 2023-08-29: qty 2

## 2023-08-29 MED ORDER — OXYCODONE-ACETAMINOPHEN 5-325 MG PO TABS
1.0000 | ORAL_TABLET | Freq: Once | ORAL | Status: AC
  Administered 2023-08-29: 1 via ORAL
  Filled 2023-08-29: qty 1

## 2023-08-29 NOTE — Discharge Instructions (Addendum)
As we discussed, your MRI did not show any recurrent mass or stroke.  You likely have vertigo.  I have prescribed meclizine 25 mg 3 times daily as needed  Your blood pressure was elevated.  Please take your blood pressure medicine as prescribed and follow-up with your doctor in a week  Return to ER if you have worse dizziness, falling, trouble walking

## 2023-08-29 NOTE — ED Triage Notes (Signed)
Patient reports dizziness and multiple falls this AM. During one fall he hit his head. Denies LOC. Does not take blood thinners. States he has not been ill in the past week.

## 2023-08-29 NOTE — ED Notes (Signed)
Patient transported to MRI 

## 2023-08-29 NOTE — ED Provider Triage Note (Signed)
Emergency Medicine Provider Triage Evaluation Note  Wesley [name] , a 72 y.o. male  was evaluated in triage.  Pt complains of Dizziness.  Review of Systems  Positive: Head injury, L thumb pain, Hx anemia Negative: Nausea, vomiting, HA, blood thinners, vision changing, tinnitus, LOC  Physical Exam  BP (!) 183/111 (BP Location: Left Arm)   Pulse (!) 102   Temp 98 F (36.7 C) (Oral)   Resp 18   Ht 5\' 4"  (1.626 m)   Wt 45.4 kg   SpO2 98%   BMI 17.18 kg/m  Gen:   Awake, no distress   Resp:  Normal effort  MSK:   Moves extremities without difficulty  Other:  Erythema to L MCP  Medical Decision Making  Medically screening exam initiated at 1:29 PM.  Appropriate orders placed.  Rolanda Jay was informed that the remainder of the evaluation will be completed by another provider, this initial triage assessment does not replace that evaluation, and the importance of remaining in the ED until their evaluation is complete.  CBC, CMP, CT Head, CT Cspine, XR L thumb, UA, EKG Hart Rochester, PA-C 08/29/23 1333

## 2023-08-29 NOTE — ED Provider Notes (Addendum)
[place] EMERGENCY DEPARTMENT AT [facility] Provider Note   CSN: [id number] Arrival date & time: 08/29/23  1228     History  Chief Complaint  Patient presents with   Dizziness   Fall    Wesley [name] is a 72 y.o. male history of meningioma status postresection, prostate cancer with urostomy, here presenting with fall.  Patient states that he got up this morning and change his urostomy tube and felt dizzy and fell backwards.  He then fell another time in the bathroom.  Patient states that he walked to his office and felt very dizzy and had to hold onto things.  He states that he fell again but did not hit his head.  He went to get some coffee and walked back to his office and then felt a fourth time and hit a artificial tree.  Patient states that he has left hand pain and persistent dizziness.  Of note patient did have a meningioma that was resected.  Patient denies any history of stroke in the past.  He states that he has double vision and see things vertically.  The history is provided by the patient.       Home Medications Prior to Admission medications   Medication Sig Start Date End Date Taking? Authorizing Provider  amLODipine (NORVASC) 5 MG tablet Take 5 mg by mouth at bedtime.    [provider]  Biotin (BIOTIN 5000) 5 MG CAPS Take 5 mg by mouth daily.    [provider]  BREO ELLIPTA 100-25 MCG/ACT AEPB INHALE 1 PUFF BY MOUTH EVERY DAY Patient taking differently: Inhale 1 puff into the lungs daily. 06/25/23   Virl Diamond A, MD  celecoxib (CELEBREX) 100 MG capsule Take 100 mg by mouth every evening.    [provider]  doxepin (SINEQUAN) 25 MG capsule Take 25 mg by mouth at bedtime.    [provider]  ferrous sulfate 325 (65 FE) MG tablet Take 325 mg by mouth daily.    [provider]  finasteride (PROSCAR) 5 MG tablet Take 5 mg by mouth daily. 02/05/13   [provider]  ketoconazole (NIZORAL) 2 %  cream Apply 1 Application topically daily as needed for irritation.    [provider]  ketoconazole (NIZORAL) 2 % shampoo Apply 1 Application topically daily as needed (yeast).    [provider]  Lactulose 20 GM/30ML SOLN Take 15 mLs (10 g total) by mouth 2 (two) times daily as needed. 07/25/23   Glade Lloyd, MD  levETIRAcetam (KEPPRA) 500 MG tablet Take 500 mg by mouth 3 (three) times daily.    [provider]  OXcarbazepine ER (OXTELLAR XR) 600 MG TB24 Take 600 mg by mouth daily in the afternoon.    [provider]  polyethylene glycol (MIRALAX / GLYCOLAX) 17 g packet Take 17 g by mouth 2 (two) times daily. 07/25/23   Glade Lloyd, MD  primidone (MYSOLINE) 50 MG tablet Take 100 mg by mouth at bedtime.    [provider]  senna-docusate (SENOKOT-S) 8.6-50 MG tablet Take 1 tablet by mouth 2 (two) times daily. 07/25/23   Glade Lloyd, MD  ustekinumab (STELARA) 90 MG/ML SOSY injection Inject 90 mg into the skin every 8 (eight) weeks.    [provider]      Allergies    Sulfa antibiotics    Review of Systems   Review of Systems  Neurological:  Positive for dizziness.  All other systems reviewed and  are negative.   Physical Exam Updated Vital Signs BP (!) 151/77   Pulse 91   Temp 98.7 F (37.1 C) (Oral)   Resp 15   Ht 5\' 4"  (1.626 m)   Wt 45.4 kg   SpO2 97%   BMI 17.18 kg/m  Physical Exam Vitals and nursing note reviewed.  Constitutional:      Appearance: Normal appearance.  HENT:     Head: Normocephalic.     Nose: Nose normal.     Mouth/Throat:     Mouth: Mucous membranes are moist.  Eyes:     Extraocular Movements: Extraocular movements intact.     Pupils: Pupils are equal, round, and reactive to light.     Comments: Nystagmus to the left  Cardiovascular:     Rate and Rhythm: Normal rate and regular rhythm.     Pulses: Normal pulses.     Heart sounds: Normal heart sounds.  Pulmonary:     Effort: Pulmonary  effort is normal.     Breath sounds: Normal breath sounds.  Abdominal:     General: Abdomen is flat.     Palpations: Abdomen is soft.  Musculoskeletal:        General: Normal range of motion.     Cervical back: Normal range of motion and neck supple.  Skin:    General: Skin is warm.     Capillary Refill: Capillary refill takes less than 2 seconds.  Neurological:     Mental Status: He is alert.     Comments: CN 2- 12 intact. No facial droop. Abnormal finger to nose on the left side   Psychiatric:        Mood and Affect: Mood normal.        Behavior: Behavior normal.     ED Results / Procedures / Treatments   Labs (all labs ordered are listed, but only abnormal results are displayed) Labs Reviewed  CBC - Abnormal; Notable for the following components:      Result Value   WBC 11.1 (*)    RBC 3.84 (*)    Hemoglobin 11.8 (*)    HCT 37.1 (*)    All other components within normal limits  COMPREHENSIVE METABOLIC PANEL - Abnormal; Notable for the following components:   Sodium 134 (*)    CO2 19 (*)    Glucose, Bld 228 (*)    BUN 42 (*)    Creatinine, Ser 1.94 (*)    Albumin 3.4 (*)    GFR, Estimated 36 (*)    All other components within normal limits  URINALYSIS, ROUTINE W REFLEX MICROSCOPIC    EKG None  Radiology DG Hand Complete Left  Result Date: 08/29/2023 CLINICAL DATA:  Fall EXAM: LEFT HAND - COMPLETE 3+ VIEW COMPARISON:  None Available. FINDINGS: Mild degenerative changes at the base of the thumb. No acute fracture or dislocation. No significant soft tissue swelling. IMPRESSION: No acute osseous abnormality. Electronically Signed   By: Jeronimo Greaves M.D.   On: 08/29/2023 15:39   CT Head Wo Contrast  Result Date: 08/29/2023 CLINICAL DATA:  Head trauma, minor (Age >= 65y); Neck trauma (Age >= 65y) EXAM: CT HEAD WITHOUT CONTRAST CT CERVICAL SPINE WITHOUT CONTRAST TECHNIQUE: Multidetector CT imaging of the head and cervical spine was performed following the standard  protocol without intravenous contrast. Multiplanar CT image reconstructions of the cervical spine were also generated. RADIATION DOSE REDUCTION: This exam was performed according to the departmental dose-optimization program which includes automated exposure control, adjustment  of the mA and/or kV according to patient size and/or use of iterative reconstruction technique. COMPARISON:  None Available. FINDINGS: CT HEAD FINDINGS Brain: Right frontotemporal lobe encephalomalacia. No evidence of acute large vascular territory infarct, acute hemorrhage, mass lesion, midline shift or hydrocephalus. Vascular: No hyperdense vessel.  Calcific atherosclerosis. Skull: No acute fracture.  Right-sided craniotomy. Sinuses/Orbits: Clear sinuses.  No acute orbital findings. Other: No mastoid effusions. CT CERVICAL SPINE FINDINGS Alignment: Approximately 2 mm of anterolisthesis of C4 on C5, likely degenerative given facet arthropathy at this level. Skull base and vertebrae: No evidence of acute fracture. Vertebral body heights are maintained. Soft tissues and spinal canal: No prevertebral fluid or swelling. No visible canal hematoma. Disc levels: Moderate to severe multilevel degenerative change including facet uncovertebral hypertrophy with varying degrees of neural foraminal stenosis. Upper chest: Visualized lung apices are clear. IMPRESSION: 1. No evidence of acute abnormality intracranially or in the cervical spine. 2. Right frontotemporal encephalomalacia. Electronically Signed   By: Feliberto Harts M.D.   On: 08/29/2023 14:38   CT Cervical Spine Wo Contrast  Result Date: 08/29/2023 CLINICAL DATA:  Head trauma, minor (Age >= 65y); Neck trauma (Age >= 65y) EXAM: CT HEAD WITHOUT CONTRAST CT CERVICAL SPINE WITHOUT CONTRAST TECHNIQUE: Multidetector CT imaging of the head and cervical spine was performed following the standard protocol without intravenous contrast. Multiplanar CT image reconstructions of the cervical spine  were also generated. RADIATION DOSE REDUCTION: This exam was performed according to the departmental dose-optimization program which includes automated exposure control, adjustment of the mA and/or kV according to patient size and/or use of iterative reconstruction technique. COMPARISON:  None Available. FINDINGS: CT HEAD FINDINGS Brain: Right frontotemporal lobe encephalomalacia. No evidence of acute large vascular territory infarct, acute hemorrhage, mass lesion, midline shift or hydrocephalus. Vascular: No hyperdense vessel.  Calcific atherosclerosis. Skull: No acute fracture.  Right-sided craniotomy. Sinuses/Orbits: Clear sinuses.  No acute orbital findings. Other: No mastoid effusions. CT CERVICAL SPINE FINDINGS Alignment: Approximately 2 mm of anterolisthesis of C4 on C5, likely degenerative given facet arthropathy at this level. Skull base and vertebrae: No evidence of acute fracture. Vertebral body heights are maintained. Soft tissues and spinal canal: No prevertebral fluid or swelling. No visible canal hematoma. Disc levels: Moderate to severe multilevel degenerative change including facet uncovertebral hypertrophy with varying degrees of neural foraminal stenosis. Upper chest: Visualized lung apices are clear. IMPRESSION: 1. No evidence of acute abnormality intracranially or in the cervical spine. 2. Right frontotemporal encephalomalacia. Electronically Signed   By: Feliberto Harts M.D.   On: 08/29/2023 14:38    Procedures Procedures    Medications Ordered in ED Medications  sodium chloride 0.9 % bolus 1,000 mL (has no administration in time range)  meclizine (ANTIVERT) tablet 25 mg (25 mg Oral Given 08/29/23 1537)    ED Course/ Medical Decision Making/ A&P                                 Medical Decision Making Wesley [name] is a 72 y.o. male here presenting with dizziness.  Concern for for vertigo versus recurrent mass versus stroke.  Plan to get CBC and CMP and MRI brain and UA.   Will get orthostatics and hydrate and reassess  11:04 PM CT head showed right frontal temporal encephalomalacia.  MRI brain is pending.  If patient unable to walk, anticipate that patient will need admission. Signed out to Dr. Jacqulyn Bath in the ED.  11:12 PM MRI came back and showed no stroke or recurrent tumor.  Patient is able to ambulate and felt better.  Will prescribe meclizine as needed  Problems Addressed: Vertigo: acute illness or injury  Amount and/or Complexity of Data Reviewed Labs: ordered. Decision-making details documented in ED Course. Radiology: ordered and independent interpretation performed. Decision-making details documented in ED Course. ECG/medicine tests: ordered and independent interpretation performed. Decision-making details documented in ED Course.  Risk OTC drugs. Prescription drug management.    Final Clinical Impression(s) / ED Diagnoses Final diagnoses:  None    Rx / DC Orders ED Discharge Orders     None         Charlynne Pander, MD 08/29/23 2305    Charlynne Pander, MD 08/29/23 [phone]

## 2023-09-02 ENCOUNTER — Emergency Department (HOSPITAL_COMMUNITY): Payer: Medicare Other

## 2023-09-02 ENCOUNTER — Emergency Department (HOSPITAL_COMMUNITY)
Admission: EM | Admit: 2023-09-02 | Discharge: 2023-09-03 | Disposition: A | Discharge status: Home / Self Care | Payer: Medicare Other | Attending: Emergency Medicine | Admitting: Emergency Medicine

## 2023-09-02 ENCOUNTER — Other Ambulatory Visit: Payer: Self-pay

## 2023-09-02 DIAGNOSIS — Z794 Long term (current) use of insulin: Secondary | ICD-10-CM | POA: Insufficient documentation

## 2023-09-02 DIAGNOSIS — N3000 Acute cystitis without hematuria: Secondary | ICD-10-CM | POA: Diagnosis not present

## 2023-09-02 DIAGNOSIS — D72829 Elevated white blood cell count, unspecified: Secondary | ICD-10-CM | POA: Insufficient documentation

## 2023-09-02 DIAGNOSIS — N189 Chronic kidney disease, unspecified: Secondary | ICD-10-CM | POA: Insufficient documentation

## 2023-09-02 DIAGNOSIS — R509 Fever, unspecified: Secondary | ICD-10-CM | POA: Diagnosis not present

## 2023-09-02 DIAGNOSIS — R0989 Other specified symptoms and signs involving the circulatory and respiratory systems: Secondary | ICD-10-CM | POA: Diagnosis not present

## 2023-09-02 DIAGNOSIS — R000 Tachycardia, unspecified: Secondary | ICD-10-CM | POA: Diagnosis not present

## 2023-09-02 DIAGNOSIS — I129 Hypertensive chronic kidney disease with stage 1 through stage 4 chronic kidney disease, or unspecified chronic kidney disease: Secondary | ICD-10-CM | POA: Insufficient documentation

## 2023-09-02 DIAGNOSIS — Z8551 Personal history of malignant neoplasm of bladder: Secondary | ICD-10-CM | POA: Insufficient documentation

## 2023-09-02 DIAGNOSIS — R918 Other nonspecific abnormal finding of lung field: Secondary | ICD-10-CM | POA: Diagnosis not present

## 2023-09-02 DIAGNOSIS — R531 Weakness: Secondary | ICD-10-CM | POA: Diagnosis not present

## 2023-09-02 LAB — CBC WITH DIFFERENTIAL/PLATELET
Abs Immature Granulocytes: 0.06 10*3/uL (ref 0.00–0.07)
Basophils Absolute: 0.1 10*3/uL (ref 0.0–0.1)
Basophils Relative: 1 %
Eosinophils Absolute: 0 10*3/uL (ref 0.0–0.5)
Eosinophils Relative: 0 %
HCT: 35 % — ABNORMAL LOW (ref 39.0–52.0)
Hemoglobin: 11.7 g/dL — ABNORMAL LOW (ref 13.0–17.0)
Immature Granulocytes: 0 %
Lymphocytes Relative: 3 %
Lymphs Abs: 0.5 10*3/uL — ABNORMAL LOW (ref 0.7–4.0)
MCH: 31.3 pg (ref 26.0–34.0)
MCHC: 33.4 g/dL (ref 30.0–36.0)
MCV: 93.6 fL (ref 80.0–100.0)
Monocytes Absolute: 0.7 10*3/uL (ref 0.1–1.0)
Monocytes Relative: 4 %
Neutro Abs: 14 10*3/uL — ABNORMAL HIGH (ref 1.7–7.7)
Neutrophils Relative %: 92 %
Platelets: 358 10*3/uL (ref 150–400)
RBC: 3.74 MIL/uL — ABNORMAL LOW (ref 4.22–5.81)
RDW: 15 % (ref 11.5–15.5)
WBC: 15.2 10*3/uL — ABNORMAL HIGH (ref 4.0–10.5)
nRBC: 0 % (ref 0.0–0.2)

## 2023-09-02 LAB — COMPREHENSIVE METABOLIC PANEL
ALT: 15 U/L (ref 0–44)
AST: 17 U/L (ref 15–41)
Albumin: 3.2 g/dL — ABNORMAL LOW (ref 3.5–5.0)
Alkaline Phosphatase: 109 U/L (ref 38–126)
Anion gap: 11 (ref 5–15)
BUN: 39 mg/dL — ABNORMAL HIGH (ref 8–23)
CO2: 15 mmol/L — ABNORMAL LOW (ref 22–32)
Calcium: 9 mg/dL (ref 8.9–10.3)
Chloride: 106 mmol/L (ref 98–111)
Creatinine, Ser: 2.31 mg/dL — ABNORMAL HIGH (ref 0.61–1.24)
GFR, Estimated: 29 mL/min — ABNORMAL LOW (ref >60)
Glucose, Bld: 152 mg/dL — ABNORMAL HIGH (ref 70–99)
Potassium: 4.2 mmol/L (ref 3.5–5.1)
Sodium: 132 mmol/L — ABNORMAL LOW (ref 135–145)
Total Bilirubin: 0.5 mg/dL (ref <1.2)
Total Protein: 7.6 g/dL (ref 6.5–8.1)

## 2023-09-02 LAB — URINALYSIS, W/ REFLEX TO CULTURE (INFECTION SUSPECTED)
Bilirubin Urine: NEGATIVE
Glucose, UA: NEGATIVE mg/dL
Ketones, ur: NEGATIVE mg/dL
Nitrite: NEGATIVE
Protein, ur: 100 mg/dL — AB
Specific Gravity, Urine: 1.011 (ref 1.005–1.030)
WBC, UA: >50 WBC/hpf (ref 0–5)
pH: 6 (ref 5.0–8.0)

## 2023-09-02 LAB — I-STAT CG4 LACTIC ACID, ED
Lactic Acid, Venous: 1 mmol/L (ref 0.5–1.9)
Lactic Acid, Venous: 0.6 mmol/L (ref 0.5–1.9)

## 2023-09-02 MED ORDER — ACETAMINOPHEN 325 MG PO TABS
650.0000 mg | ORAL_TABLET | Freq: Once | ORAL | Status: AC
  Administered 2023-09-02: 650 mg via ORAL
  Filled 2023-09-02: qty 2

## 2023-09-02 MED ORDER — CEFUROXIME AXETIL 250 MG PO TABS: 250.0000 mg | ORAL_TABLET | Freq: Two times a day (BID) | ORAL | 0 refills | Status: AC

## 2023-09-02 MED ORDER — LACTATED RINGERS IV BOLUS
1000.0000 mL | Freq: Once | INTRAVENOUS | Status: AC
  Administered 2023-09-02: 1000 mL via INTRAVENOUS

## 2023-09-02 MED ORDER — SODIUM CHLORIDE 0.9 % IV SOLN
1.0000 g | Freq: Once | INTRAVENOUS | Status: AC
  Administered 2023-09-02: 1 g via INTRAVENOUS
  Filled 2023-09-02: qty 10

## 2023-09-02 NOTE — Discharge Instructions (Signed)
Take the antibiotics as prescribed for your infection.  Follow-up with your doctor to be rechecked.  Return to the ER for persistent fever vomiting or other concerning symptoms.

## 2023-09-02 NOTE — ED Triage Notes (Signed)
Patient reports fever today up to 102.4 and generalized weakness. Extensive urology history with urostomy in place. Hx of sepsis due to kidney infections. No meds PTA.

## 2023-09-02 NOTE — ED Provider Notes (Signed)
EMERGENCY DEPARTMENT AT [facility] Provider Note   CSN: [id number] Arrival date & time: 09/02/23  2001     History  Chief complaint: Fever  Wesley [name] is a 72 y.o. male.  HPI   Patient presents ED for evaluation of a fever.  Patient has a history of chronic kidney disease, bladder cancer sepsis due to UTI, hypertension, seizures.  Patient states he had a fever up to 102.4 today at home.  Has been feeling generally weak.  Has not had any other focal symptoms of cough or sore throat.  No vomiting or diarrhea.  No abdominal pain.  Patient states he has had some urine infections in the past with the similar symptoms.  He called his doctor instructed him to come to the ED for evaluation  Home Medications Prior to Admission medications   Medication Sig Start Date End Date Taking? Authorizing Provider  cefUROXime (CEFTIN) 250 MG tablet Take 1 tablet (250 mg total) by mouth 2 (two) times daily with a meal for 7 days. 09/02/23 09/09/23 Yes Linwood Dibbles, MD  amLODipine (NORVASC) 5 MG tablet Take 5 mg by mouth at bedtime.    [provider]  Biotin (BIOTIN 5000) 5 MG CAPS Take 5 mg by mouth daily.    [provider]  BREO ELLIPTA 100-25 MCG/ACT AEPB INHALE 1 PUFF BY MOUTH EVERY DAY Patient taking differently: Inhale 1 puff into the lungs daily. 06/25/23   Virl Diamond A, MD  celecoxib (CELEBREX) 100 MG capsule Take 100 mg by mouth every evening.    [provider]  doxepin (SINEQUAN) 25 MG capsule Take 25 mg by mouth at bedtime.    [provider]  ferrous sulfate 325 (65 FE) MG tablet Take 325 mg by mouth daily.    [provider]  finasteride (PROSCAR) 5 MG tablet Take 5 mg by mouth daily. 02/05/13   [provider]  ketoconazole (NIZORAL) 2 % cream Apply 1 Application topically daily as needed for irritation.    [provider]  ketoconazole (NIZORAL) 2 % shampoo Apply 1 Application topically daily as  needed (yeast).    [provider]  Lactulose 20 GM/30ML SOLN Take 15 mLs (10 g total) by mouth 2 (two) times daily as needed. 07/25/23   Glade Lloyd, MD  levETIRAcetam (KEPPRA) 500 MG tablet Take 500 mg by mouth 3 (three) times daily.    [provider]  meclizine (ANTIVERT) 25 MG tablet Take 1 tablet (25 mg total) by mouth 3 (three) times daily as needed for dizziness. 08/29/23   Charlynne Pander, MD  OXcarbazepine ER (OXTELLAR XR) 600 MG TB24 Take 600 mg by mouth daily in the afternoon.    [provider]  polyethylene glycol (MIRALAX / GLYCOLAX) 17 g packet Take 17 g by mouth 2 (two) times daily. 07/25/23   Glade Lloyd, MD  primidone (MYSOLINE) 50 MG tablet Take 100 mg by mouth at bedtime.    [provider]  senna-docusate (SENOKOT-S) 8.6-50 MG tablet Take 1 tablet by mouth 2 (two) times daily. 07/25/23   Glade Lloyd, MD  ustekinumab (STELARA) 90 MG/ML SOSY injection Inject 90 mg into the skin every 8 (eight) weeks.    [provider]      Allergies    Sulfa antibiotics    Review of Systems   Review of Systems  Physical Exam Updated Vital Signs BP 128/71 (BP Location: Left Arm)   Pulse 91   Temp 99.3  F (37.4 C) (Oral)   Resp 19   Ht 1.626 m (5\' 4" )   Wt 49.9 kg   SpO2 95%   BMI 18.88 kg/m  Physical Exam Vitals and nursing note reviewed.  Constitutional:      Appearance: He is well-developed. He is not diaphoretic.  HENT:     Head: Normocephalic and atraumatic.     Right Ear: External ear normal.     Left Ear: External ear normal.  Eyes:     General: No scleral icterus.       Right eye: No discharge.        Left eye: No discharge.     Conjunctiva/sclera: Conjunctivae normal.  Neck:     Trachea: No tracheal deviation.  Cardiovascular:     Rate and Rhythm: Regular rhythm. Tachycardia present.  Pulmonary:     Effort: Pulmonary effort is normal. No respiratory distress.     Breath sounds: Normal breath sounds. No  stridor. No wheezing or rales.  Abdominal:     General: Bowel sounds are normal. There is no distension.     Palpations: Abdomen is soft.     Tenderness: There is no abdominal tenderness. There is no guarding or rebound.     Comments: Ureterostomy bag noted,  Musculoskeletal:        General: No tenderness or deformity.     Cervical back: Neck supple.  Skin:    General: Skin is warm and dry.     Findings: No rash.  Neurological:     General: No focal deficit present.     Mental Status: He is alert.     Cranial Nerves: No cranial nerve deficit, dysarthria or facial asymmetry.     Sensory: No sensory deficit.     Motor: No abnormal muscle tone or seizure activity.     Coordination: Coordination normal.  Psychiatric:        Mood and Affect: Mood normal.     ED Results / Procedures / Treatments   Labs (all labs ordered are listed, but only abnormal results are displayed) Labs Reviewed  COMPREHENSIVE METABOLIC PANEL - Abnormal; Notable for the following components:      Result Value   Sodium 132 (*)    CO2 15 (*)    Glucose, Bld 152 (*)    BUN 39 (*)    Creatinine, Ser 2.31 (*)    Albumin 3.2 (*)    GFR, Estimated 29 (*)    All other components within normal limits  CBC WITH DIFFERENTIAL/PLATELET - Abnormal; Notable for the following components:   WBC 15.2 (*)    RBC 3.74 (*)    Hemoglobin 11.7 (*)    HCT 35.0 (*)    Neutro Abs 14.0 (*)    Lymphs Abs 0.5 (*)    All other components within normal limits  URINALYSIS, W/ REFLEX TO CULTURE (INFECTION SUSPECTED) - Abnormal; Notable for the following components:   APPearance CLOUDY (*)    Hgb urine dipstick SMALL (*)    Protein, ur 100 (*)    Leukocytes,Ua LARGE (*)    Bacteria, UA MANY (*)    All other components within normal limits  CULTURE, BLOOD (ROUTINE X 2)  CULTURE, BLOOD (ROUTINE X 2)  URINE CULTURE  I-STAT CG4 LACTIC ACID, ED  I-STAT CG4 LACTIC ACID, ED    EKG None  Radiology DG Chest 2 View  Result  Date: 09/02/2023 CLINICAL DATA:  Fever and weakness. EXAM: CHEST - 2 VIEW COMPARISON:  July 22, 2023 FINDINGS: The heart size and mediastinal contours are within normal limits. Low lung volumes are noted. Mild areas of atelectasis are seen within the bilateral lung bases mild right infrahilar atelectasis and/or infiltrate is also noted. A small radiopaque clip is seen within the mid left lung. No pleural effusion or pneumothorax is identified. Multilevel degenerative changes seen throughout the thoracic spine. IMPRESSION: Low lung volumes with mild bibasilar atelectasis and/or infiltrate. Electronically Signed   By: Aram Candela M.D.   On: 09/02/2023 22:17    Procedures Procedures    Medications Ordered in ED Medications  acetaminophen (TYLENOL) tablet 650 mg (650 mg Oral Given 09/02/23 2134)  lactated ringers bolus 1,000 mL (1,000 mLs Intravenous New Bag/Given 09/02/23 2301)  cefTRIAXone (ROCEPHIN) 1 g in sodium chloride 0.9 % 100 mL IVPB (1 g Intravenous New Bag/Given 09/02/23 2301)    ED Course/ Medical Decision Making/ A&P Clinical Course as of 09/02/23 2346  Mon Sep 02, 2023  2219 CBC with Differential(!) Leukocytosis noted with a white blood cell count of 15.  Lactic acid level normal. [JK]  2219 Comprehensive metabolic panel(!) Metabolic panel does show elevated creatinine 2.31.  Increased compared to last, bicarb decreased to 15 [JK]  2244 Urostomy specimen does suggest the possibility of urinary tract infection with many bacteria greater than 50 white blood cells large leukocyte esterase. [JK]  2338 Patient states he is feeling better [JK]    Clinical Course User Index [JK] Linwood Dibbles, MD                                 Medical Decision Making Problems Addressed: Acute cystitis without hematuria: acute illness or injury that poses a threat to life or bodily functions  Amount and/or Complexity of Data Reviewed Labs: ordered. Decision-making details documented in ED  Course. Radiology: ordered and independent interpretation performed.  Risk OTC drugs. Prescription drug management.   Patient presented to the ED for evaluation of fever concerns for possible urinary tract infection.  Patient has had trouble with urinary tract infections and sepsis in the past.  Patient's had a fever at home but no fever noted in the ED.  Initially was mildly tachycardic but that improved with IV hydration.  Patient does have a leukocytosis but no signs of lactic acidosis or sepsis at this time.  Urinalysis consistent with urinary tract infection.  Slightly increased creatinine compared to baseline but no signs of severe dehydration.  Patient was treated with IV fluids in the ED and is feeling better.  Discussed options of being admitted to the hospital for IV antibiotics considering his prior history.  Patient states he is feeling better and would like to try and go home.  Patient understands to return to ED immediately if he has any worsening symptoms.        Final Clinical Impression(s) / ED Diagnoses Final diagnoses:  Acute cystitis without hematuria    Rx / DC Orders ED Discharge Orders          Ordered    cefUROXime (CEFTIN) 250 MG tablet  2 times daily with meals        09/02/23 2342              Linwood Dibbles, MD 09/02/23 2346

## 2023-09-02 NOTE — ED Notes (Signed)
Patient reported fevers at home and wife repoerted patient has not been his "perky self". Patient resting in bed with wife at bedside

## 2023-09-03 DIAGNOSIS — M1612 Unilateral primary osteoarthritis, left hip: Secondary | ICD-10-CM | POA: Diagnosis not present

## 2023-09-04 LAB — URINE CULTURE: Culture: >=100000 — AB

## 2023-09-05 ENCOUNTER — Telehealth (HOSPITAL_BASED_OUTPATIENT_CLINIC_OR_DEPARTMENT_OTHER): Payer: Self-pay | Admitting: *Deleted

## 2023-09-05 NOTE — Telephone Encounter (Signed)
Post ED Visit - Positive Culture Follow-up  Culture report reviewed by antimicrobial stewardship pharmacist: [facility] []  Enzo Bi, Pharm.D. []  Celedonio Miyamoto, Pharm.D., BCPS AQ-ID []  Garvin Fila, Pharm.D., BCPS []  Georgina Pillion, Pharm.D., BCPS []  [place], [address].D., BCPS, AAHIVP []  Estella Husk, Pharm.D., BCPS, AAHIVP []  Lysle Pearl, PharmD, BCPS []  Phillips Climes, PharmD, BCPS []  Agapito Games, PharmD, BCPS []  Verlan Friends, PharmD []  Mervyn Gay, PharmD, BCPS []  Vinnie Level, PharmD  [facility] [x]  Enzo Bi, PharmD []  Greer Pickerel, PharmD []  Adalberto Cole, PharmD []  Perlie Gold, Rph []  Lonell Face) Jean Rosenthal, PharmD []  Earl Many, PharmD []  Junita Push, PharmD []  Dorna Leitz, PharmD []  Terrilee Files, PharmD []  Lynann Beaver, PharmD []  Keturah Barre, PharmD []  Loralee Pacas, PharmD []  Bernadene Person, PharmD   Positive urine culture Treated with Cefuroxime Axetil, organism sensitive to the same and no further patient follow-up is required at this time.  Virl Axe [facility] 09/05/2023, 9:12 AM

## 2023-09-07 LAB — CULTURE, BLOOD (ROUTINE X 2)
Culture: NO GROWTH
Culture: NO GROWTH

## 2023-09-12 DIAGNOSIS — D044 Carcinoma in situ of skin of scalp and neck: Secondary | ICD-10-CM | POA: Diagnosis not present

## 2023-09-12 DIAGNOSIS — Z932 Ileostomy status: Secondary | ICD-10-CM | POA: Diagnosis not present

## 2023-09-12 DIAGNOSIS — Z936 Other artificial openings of urinary tract status: Secondary | ICD-10-CM | POA: Diagnosis not present

## 2023-09-12 DIAGNOSIS — Z8551 Personal history of malignant neoplasm of bladder: Secondary | ICD-10-CM | POA: Diagnosis not present

## 2023-09-17 DIAGNOSIS — C61 Malignant neoplasm of prostate: Secondary | ICD-10-CM | POA: Diagnosis not present

## 2023-09-17 DIAGNOSIS — C775 Secondary and unspecified malignant neoplasm of intrapelvic lymph nodes: Secondary | ICD-10-CM | POA: Diagnosis not present

## 2023-09-17 DIAGNOSIS — N200 Calculus of kidney: Secondary | ICD-10-CM | POA: Diagnosis not present

## 2023-09-17 DIAGNOSIS — R109 Unspecified abdominal pain: Secondary | ICD-10-CM | POA: Diagnosis not present

## 2023-09-17 DIAGNOSIS — C670 Malignant neoplasm of trigone of bladder: Secondary | ICD-10-CM | POA: Diagnosis not present

## 2023-09-23 ENCOUNTER — Other Ambulatory Visit: Payer: Self-pay | Admitting: Pulmonary Disease

## 2023-09-23 ENCOUNTER — Ambulatory Visit: Payer: Medicare Other | Admitting: Pulmonary Disease

## 2023-09-23 VITALS — BP 118/86 | HR 68 | Resp 16 | Ht 64.0 in | Wt 110.4 lb

## 2023-09-23 DIAGNOSIS — R9389 Abnormal findings on diagnostic imaging of other specified body structures: Secondary | ICD-10-CM

## 2023-09-23 DIAGNOSIS — J90 Pleural effusion, not elsewhere classified: Secondary | ICD-10-CM

## 2023-09-23 MED ORDER — MIRTAZAPINE 15 MG PO TABS: 15.0000 mg | ORAL_TABLET | Freq: Every day | ORAL | 3 refills | Status: DC

## 2023-09-23 NOTE — Progress Notes (Signed)
Wesley [name]    [id number]    1951-01-08  Primary Care Physician:Smith, Teresita Madura, MD  Referring Physician: [name], MD [address] [facility] [address]  [place] [address]  Chief complaint:   Patient was seen previously for loculated pleural effusion that did resolve Recently had bladder surgery following which in the last couple of days started having pleuritic chest pains  HPI:  Not having pleuritic chest pain has lost over 40 pounds  Bladder cancer being treated  Loculated pleural effusion for which she had a recent CT  He does have a CT scan scheduled for December 27 which I will follow-up on  Otherwise has lost a good amount of weight Decreased appetite Constipation  Recent sepsis in October for which he was hospitalized  Since being diagnosed with bladder cancer, has had multiple issues  Was treated for UTI, had 2 procedures to combat a renal stone, had a stent placed and drain placed Started having some discomfort afterwards and this led to being evaluated in the emergency department Has used multiple courses of antibiotics when he was dealing with a urinary tract infection, does not feel the infection is ongoing at present  Did lose some weight following his previous surgical intervention When he was evaluated in the emergency room he was treated with a course of Levaquin and this follow-up was ordered  10-pack-year smoking history quit a while back   Outpatient Encounter Medications as of 09/23/2023  Medication Sig   amLODipine (NORVASC) 5 MG tablet Take 5 mg by mouth at bedtime.   Biotin (BIOTIN 5000) 5 MG CAPS Take 5 mg by mouth daily.   BREO ELLIPTA 100-25 MCG/ACT AEPB INHALE 1 PUFF BY MOUTH EVERY DAY (Patient taking differently: Inhale 1 puff into the lungs daily.)   celecoxib (CELEBREX) 100 MG capsule Take 100 mg by mouth every evening.   doxepin (SINEQUAN) 25 MG capsule Take 25 mg by mouth at bedtime.   ferrous  sulfate 325 (65 FE) MG tablet Take 325 mg by mouth daily.   finasteride (PROSCAR) 5 MG tablet Take 5 mg by mouth daily.   ketoconazole (NIZORAL) 2 % cream Apply 1 Application topically daily as needed for irritation.   ketoconazole (NIZORAL) 2 % shampoo Apply 1 Application topically daily as needed (yeast).   levETIRAcetam (KEPPRA) 500 MG tablet Take 500 mg by mouth 3 (three) times daily.   meclizine (ANTIVERT) 25 MG tablet Take 1 tablet (25 mg total) by mouth 3 (three) times daily as needed for dizziness.   OXcarbazepine ER (OXTELLAR XR) 600 MG TB24 Take 600 mg by mouth daily in the afternoon.   polyethylene glycol (MIRALAX / GLYCOLAX) 17 g packet Take 17 g by mouth 2 (two) times daily.   primidone (MYSOLINE) 50 MG tablet Take 100 mg by mouth at bedtime.   ustekinumab (STELARA) 90 MG/ML SOSY injection Inject 90 mg into the skin every 8 (eight) weeks.   [DISCONTINUED] Lactulose 20 GM/30ML SOLN Take 15 mLs (10 g total) by mouth 2 (two) times daily as needed.   [DISCONTINUED] senna-docusate (SENOKOT-S) 8.6-50 MG tablet Take 1 tablet by mouth 2 (two) times daily.   Facility-Administered Encounter Medications as of 09/23/2023  Medication   magnesium citrate solution 1 Bottle   magnesium citrate solution 1 Bottle    Allergies as of 09/23/2023 - Review Complete 09/23/2023  Allergen Reaction Noted   Sulfa antibiotics Rash 03/01/2012    Past Medical History:  Diagnosis Date   Arthritis    Atypical chest pain    cardiologist-- dr Leeann Must;  chronic occasional,  pressure / discomfort, LOV 07/14/21 (as of 06/08/22)   Bladder calculus    BPH with obstruction/lower urinary tract symptoms    Follows with Dr. Kasandra Knudsen, urologist.   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 Daphane Shepherd at [facility] neurology, LOV w/ FNP on 03/21/22 in Epic ( as of 06/08/22)   Generalized abdominal pain    w/  bloating due to UC  take doxepin   GERD (gastroesophageal reflux disease)    H/O urinary retention    History of bladder stone    History of hiatal hernia    History of kidney stones    History of prostate cancer 2010   urologist--- dr pace;   dx 2010,  Gleason 4+3, PSA 5.2;   03-04-2009  s/p radiactive prostate seed implants and external beam radiation   History of repair of hiatal hernia    02-11-2017  s/p lap paraesophageal hernia repair w/ nissen fundoplication   History of skin cancer    Hx of resection of meningioma 02/11/2009   benign right temperal lobe meningioma s/p resection  (residual hearing loss)   (neurosurgeon-- dr Tyrone Sage)   Hypertension    followed by cardiologist and pcp   Insomnia    Migraine with aura and without status migrainosus, not intractable    neurologist-- dr Marlena Clipper   Prostate cancer [facility])    Simple renal cyst    bilateral ;   followed by urologist   Ulcerative colitis (HCC)    followed by dr v. Rhetta Mura (GI);  dx 2010;  current treatment stelara q8wks, LOV 06/07/22 (as of 06/08/22)   Wears glasses     Past Surgical History:  Procedure Laterality Date   BOTOX INJECTION N/A 01/29/2023   Procedure: BOTOX INJECTION 100 UNITS;  Surgeon: Noel Christmas, MD;  Location: [facility] [place];  Service: Urology;  Laterality: N/A;   BRAIN MENINGIOMA EXCISION  02/11/2009   @[facility] ;  craniotomy , right temporal medial sphonoid wing resection   CARPAL TUNNEL RELEASE Bilateral    right 09-05-2016  @[facility] ;   left 2013   COLONOSCOPY  12/2019   COLONOSCOPY  01/2022   CYSTOSCOPY  03/05/2012   Procedure: CYSTOSCOPY;  Surgeon: Antony Haste, MD;  Location: [facility];  Service: Urology;  Laterality: N/A;  Cysto Clot Evacuation and Fulgeration Of [place] ([place])   CYSTOSCOPY WITH INJECTION N/A  05/22/2023   Procedure: CYSTOSCOPY WITH INJECTION OF INDOCYANINE GREEN DYE;  Surgeon: Loletta Parish., MD;  Location: [facility];  Service: Urology;  Laterality: N/A;   CYSTOSCOPY WITH LITHOLAPAXY N/A 03/13/2019   Procedure: CYSTOSCOPY WITH lASER LITHOLAPAXY, DILATION OF STRICTURE;  Surgeon: Ihor Gully, MD;  Location: [facility];  Service: Urology;  Laterality: N/A;   CYSTOSCOPY WITH LITHOLAPAXY N/A 01/02/2022   Procedure: CYSTOSCOPY WITH LITHOTRIPSY;  Surgeon: Noel Christmas, MD;  Location: [facility];  Service: Urology;  Laterality: N/A;   CYSTOSCOPY WITH RETROGRADE PYELOGRAM, URETEROSCOPY AND STENT PLACEMENT Right 03/13/2022   Procedure: CYSTOSCOPY WITH RETROGRADE PYELOGRAM, URETEROSCOPY AND STENT PLACEMENT;  Surgeon: Noel Christmas,  MD;  Location: [facility];  Service: Urology;  Laterality: Right;  75 MINS   CYSTOSCOPY WITH RETROGRADE PYELOGRAM, URETEROSCOPY AND STENT PLACEMENT Right 08/07/2022   Procedure: CYSTOSCOPY WITH RETROGRADE PYELOGRAM, URETEROSCOPY AND STENT PLACEMENT, REMOVAL OF NEPHROURETERAL STENT;  Surgeon: Noel Christmas, MD;  Location: [facility];  Service: Urology;  Laterality: Right;  75 MINS   CYSTOSCOPY WITH URETHRAL DILATATION N/A 08/23/2020   Procedure: CYSTOSCOPY WITH URETHRAL DILATATION, BLADDER BIOPSY, FULGERATION;  Surgeon: Noel Christmas, MD;  Location: [facility];  Service: Urology;  Laterality: N/A;  1 HR   CYSTOSCOPY WITH URETHRAL DILATATION N/A 03/13/2021   Procedure: CYSTOSCOPY WITH URETHRAL BALLOON DILATATION/ FOLEY CATHETER PLACEMENT;  Surgeon: Noel Christmas, MD;  Location[facility];  Service: Urology;  Laterality: N/A;   CYSTOSCOPY WITH URETHRAL DILATATION Left 07/08/2009   @[facility] :   with left  RTG   CYSTOSCOPY WITH URETHRAL DILATATION N/A 06/19/2022   Procedure: CYSTOSCOPY WITH OPTILUME URETHRAL DILATATION;  Surgeon: Noel Christmas, MD;  Location: [facility];   Service: Urology;  Laterality: N/A;   CYSTOSCOPY/URETEROSCOPY/HOLMIUM LASER/STENT PLACEMENT Bilateral 01/29/2023   Procedure: DIAGNOSTIC CYSTOSCOPY , LEFT RETROGRADE PYELOGRAM, LEFT URETERAL STENT PLACEMENT, AND STONE BASKETING;  Surgeon: Noel Christmas, MD;  Location: [facility] ;  Service: Urology;  Laterality: Bilateral;   HOLMIUM LASER APPLICATION N/A 08/23/2020   Procedure: HOLMIUM LASER APPLICATION OF URETHRAL STONE;  Surgeon: Noel Christmas, MD;  Location: [facility];  Service: Urology;  Laterality: N/A;   HOLMIUM LASER APPLICATION Right 03/13/2022   Procedure: HOLMIUM LASER APPLICATION;  Surgeon: Noel Christmas, MD;  Location: [facility];  Service: Urology;  Laterality: Right;   HOLMIUM LASER APPLICATION Right 08/07/2022   Procedure: HOLMIUM LASER APPLICATION;  Surgeon: Noel Christmas, MD;  Location[facility];  Service: Urology;  Laterality: Right;   INGUINAL HERNIA REPAIR Right 05/03/2015   @[facility]    INSERTION PROSTATE RADIATION SEED  03/04/2009   @[facility]    IR NEPHROSTOMY PLACEMENT RIGHT  07/18/2022   IR URETERAL STENT RIGHT NEW ACCESS W/O SEP NEPHROSTOMY CATH  02/04/2023   KNEE CARTILAGE SURGERY Left    1980S   LAPAROSCOPIC PARAESOPHAGEAL HERNIA REPAIR  02/11/2017   @[facility] ;  W/  NISSEN FUNDOPLICATION (MESH)   LYMPH NODE DISSECTION Bilateral 05/22/2023   Procedure: LYMPH NODE DISSECTION;  Surgeon: Loletta Parish., MD;  Location[facility];  Service: Urology;  Laterality: Bilateral;   NEPHROLITHOTOMY Right 03/29/2023   Procedure: NEPHROLITHOTOMY PERCUTANEOUS;  Surgeon: Loletta Parish., MD;  Location[facility];  Service: Urology;  Laterality: Right;  2 HRS   PROSTATE BIOPSY     ROBOT ASSISTED LAPAROSCOPIC COMPLETE CYSTECT ILEAL CONDUIT N/A 05/22/2023   Procedure: XI ROBOTIC ASSISTED LAPAROSCOPIC COMPLETE CYSTECT ILEAL CONDUIT;  Surgeon: Loletta Parish., MD;  Location[facility];  Service: Urology;  Laterality: N/A;   ROBOT  ASSISTED LAPAROSCOPIC RADICAL PROSTATECTOMY N/A 05/22/2023   Procedure: XI ROBOTIC ASSISTED LAPAROSCOPIC RADICAL PROSTATECTOMY;  Surgeon: Loletta Parish., MD;  Location[facility];  Service: Urology;  Laterality: N/A;   TOE SURGERY     TRANSURETHRAL RESECTION OF BLADDER TUMOR N/A 01/02/2022   Procedure: TRANSURETHRAL RESECTION OF BLADDER TUMOR (TURBT);  Surgeon: Noel Christmas, MD;  Location: [facility];  Service: Urology;  Laterality: N/A;   TRANSURETHRAL RESECTION OF BLADDER TUMOR N/A 01/29/2023   Procedure: TRANSURETHRAL RESECTION OF BLADDER TUMOR (TURBT);  Surgeon: Noel Christmas, MD;  Location: [facility];  Service: Urology;  Laterality: N/A;    No family history on file.  Social History   Socioeconomic History   Marital status: Married    Spouse name: Not on file   Number of children: Not on file   Years of education: Not on file   Highest education level: Not on file  Occupational History   Not on file  Tobacco Use   Smoking status: Former    Current packs/day: 0.00    Average packs/day: 1 pack/day for 10.0 years (10.0 ttl pk-yrs)    Types: Cigarettes    Start date: 03/01/1977    Quit date: 03/02/1987    Years since quitting: 36.5   Smokeless tobacco: Never  Vaping Use   Vaping status: Never Used  Substance and Sexual Activity   Alcohol use: Yes    Comment: Rare   Drug use: Never   Sexual activity: Not Currently  Other Topics Concern   Not on file  Social History Narrative   Not on file   Social Drivers of Health   Financial Resource Strain: Low Risk  (10/09/2021)   [facility] from [facility] visits prior to 12/08/2022., [facility] visits prior to 12/08/2022.   Overall Financial Resource Strain (CARDIA)    Difficulty of Paying Living Expenses: Not hard at all  Food Insecurity: Low Risk  (08/22/2023)   [facility]   Hunger Vital Sign    Worried About Running Out of Food  in the Last Year: Never true    Ran Out of Food in the Last Year: Never true  Transportation Needs: No Transportation Needs (08/22/2023)   Received from [employer]    In the past 12 months, has lack of reliable transportation kept you from medical appointments, meetings, work or from getting things needed for daily living? : No  Physical Activity: Insufficiently Active (10/09/2021)   Received from [facility] visits prior to 12/08/2022., [facility] visits prior to 12/08/2022.   Exercise Vital Sign    Days of Exercise per Week: 2 days    Minutes of Exercise per Session: 40 min  Stress: Stress Concern Present (10/09/2021)   [facility] from [facility] visits prior to 12/08/2022., [facility] visits prior to 12/08/2022.   [employer] of Occupational Health - Occupational Stress Questionnaire    Feeling of Stress : To some extent  Social Connections: Unknown (02/12/2022)   [facility]   Social Network    Social Network: Not on file  Intimate Partner Violence: Not At Risk (07/22/2023)   Humiliation, Afraid, Rape, and Kick questionnaire    Fear of Current or Ex-Partner: No    Emotionally Abused: No    Physically Abused: No    Sexually Abused: No    Review of Systems  Respiratory:  Negative for chest tightness and shortness of breath.   Psychiatric/Behavioral:  Negative for sleep disturbance.     Vitals:   09/23/23 0916  BP: 118/86  Pulse: 68  Resp: 16  SpO2: (!) 88%     Physical Exam Constitutional:      Appearance: Normal appearance.  HENT:     Head: Normocephalic and atraumatic.     Nose: Nose normal.     Mouth/Throat:     Mouth: Mucous membranes are moist.  Cardiovascular:     Rate and  Rhythm: Normal rate and regular rhythm.     Heart sounds: No murmur heard.    No friction rub.  Pulmonary:     Effort: No respiratory distress.     Breath  sounds: No stridor. No wheezing or rhonchi.  Musculoskeletal:     Cervical back: No rigidity or tenderness.  Neurological:     Mental Status: He is alert.  Psychiatric:        Mood and Affect: Mood normal.    Data Reviewed: CT chest reviewed during this visit 07/19/2023, was compared with previous CAT scans Loculated fusion, atelectasis  IR visit reviewed  Assessment:  Loculated pleural effusion -Persistent of pleural effusion -Atelectasis  His breathing has been steady  Has had multiple issues with his bladder cancer -Concern for metastatic disease  Weight loss Decreased appetite Persistent constipation despite other interventions   Plan/Recommendations: Follow-up on CAT scan that is ordered for December 27  Increased fiber intake  Trial with mirtazapine for appetite stimulation  Graded activities as tolerated  Needs to put on some weight  Follow-up in 3 months  Encouraged to call with significant concerns    Virl Diamond MD [facility] 09/23/2023, 9:25 AM  CC: [name], MD

## 2023-09-23 NOTE — Patient Instructions (Addendum)
Will try and pay attention to your CAT scan that is for 27 December  You can call, chart message to discuss it afterwards  Your oxygen level looks fine  Try and eat more  Try and put on some weight  Regular use ice as tolerated  Follow-up in 3  Mirtazapine does interact with doxepin.  I have taken doxepin off your list of medications

## 2023-09-26 DIAGNOSIS — R140 Abdominal distension (gaseous): Secondary | ICD-10-CM | POA: Diagnosis not present

## 2023-09-26 DIAGNOSIS — K5900 Constipation, unspecified: Secondary | ICD-10-CM | POA: Diagnosis not present

## 2023-09-26 DIAGNOSIS — R1084 Generalized abdominal pain: Secondary | ICD-10-CM | POA: Diagnosis not present

## 2023-09-26 DIAGNOSIS — K5190 Ulcerative colitis, unspecified, without complications: Secondary | ICD-10-CM | POA: Diagnosis not present

## 2023-09-30 ENCOUNTER — Inpatient Hospital Stay (HOSPITAL_COMMUNITY)
Admission: EM | Admit: 2023-09-30 | Discharge: 2023-10-03 | DRG: 872 | Disposition: A | Discharge status: Home / Self Care | Payer: Medicare Other | Attending: Internal Medicine | Admitting: Internal Medicine

## 2023-09-30 ENCOUNTER — Encounter (HOSPITAL_COMMUNITY): Payer: Self-pay

## 2023-09-30 ENCOUNTER — Emergency Department (HOSPITAL_COMMUNITY): Payer: Medicare Other

## 2023-09-30 ENCOUNTER — Other Ambulatory Visit: Payer: Self-pay

## 2023-09-30 DIAGNOSIS — N184 Chronic kidney disease, stage 4 (severe): Secondary | ICD-10-CM | POA: Diagnosis not present

## 2023-09-30 DIAGNOSIS — N390 Urinary tract infection, site not specified: Secondary | ICD-10-CM | POA: Diagnosis present

## 2023-09-30 DIAGNOSIS — Z7951 Long term (current) use of inhaled steroids: Secondary | ICD-10-CM

## 2023-09-30 DIAGNOSIS — Z85828 Personal history of other malignant neoplasm of skin: Secondary | ICD-10-CM | POA: Diagnosis not present

## 2023-09-30 DIAGNOSIS — Z882 Allergy status to sulfonamides status: Secondary | ICD-10-CM | POA: Diagnosis not present

## 2023-09-30 DIAGNOSIS — Z906 Acquired absence of other parts of urinary tract: Secondary | ICD-10-CM

## 2023-09-30 DIAGNOSIS — N136 Pyonephrosis: Secondary | ICD-10-CM | POA: Diagnosis not present

## 2023-09-30 DIAGNOSIS — Z86011 Personal history of benign neoplasm of the brain: Secondary | ICD-10-CM | POA: Diagnosis not present

## 2023-09-30 DIAGNOSIS — A419 Sepsis, unspecified organism: Secondary | ICD-10-CM | POA: Diagnosis not present

## 2023-09-30 DIAGNOSIS — N401 Enlarged prostate with lower urinary tract symptoms: Secondary | ICD-10-CM | POA: Diagnosis not present

## 2023-09-30 DIAGNOSIS — Z87891 Personal history of nicotine dependence: Secondary | ICD-10-CM | POA: Diagnosis not present

## 2023-09-30 DIAGNOSIS — Z8551 Personal history of malignant neoplasm of bladder: Secondary | ICD-10-CM

## 2023-09-30 DIAGNOSIS — I129 Hypertensive chronic kidney disease with stage 1 through stage 4 chronic kidney disease, or unspecified chronic kidney disease: Secondary | ICD-10-CM | POA: Diagnosis present

## 2023-09-30 DIAGNOSIS — C679 Malignant neoplasm of bladder, unspecified: Secondary | ICD-10-CM | POA: Diagnosis not present

## 2023-09-30 DIAGNOSIS — H9190 Unspecified hearing loss, unspecified ear: Secondary | ICD-10-CM | POA: Diagnosis present

## 2023-09-30 DIAGNOSIS — Z1152 Encounter for screening for COVID-19: Secondary | ICD-10-CM | POA: Diagnosis not present

## 2023-09-30 DIAGNOSIS — Z79899 Other long term (current) drug therapy: Secondary | ICD-10-CM | POA: Diagnosis not present

## 2023-09-30 DIAGNOSIS — A4159 Other Gram-negative sepsis: Secondary | ICD-10-CM | POA: Diagnosis not present

## 2023-09-30 DIAGNOSIS — G40209 Localization-related (focal) (partial) symptomatic epilepsy and epileptic syndromes with complex partial seizures, not intractable, without status epilepticus: Secondary | ICD-10-CM | POA: Diagnosis not present

## 2023-09-30 DIAGNOSIS — E785 Hyperlipidemia, unspecified: Secondary | ICD-10-CM | POA: Diagnosis present

## 2023-09-30 DIAGNOSIS — I10 Essential (primary) hypertension: Secondary | ICD-10-CM | POA: Diagnosis present

## 2023-09-30 DIAGNOSIS — Z8546 Personal history of malignant neoplasm of prostate: Secondary | ICD-10-CM

## 2023-09-30 DIAGNOSIS — Z923 Personal history of irradiation: Secondary | ICD-10-CM

## 2023-09-30 DIAGNOSIS — E876 Hypokalemia: Secondary | ICD-10-CM | POA: Diagnosis not present

## 2023-09-30 DIAGNOSIS — R935 Abnormal findings on diagnostic imaging of other abdominal regions, including retroperitoneum: Secondary | ICD-10-CM | POA: Diagnosis not present

## 2023-09-30 DIAGNOSIS — K5190 Ulcerative colitis, unspecified, without complications: Secondary | ICD-10-CM

## 2023-09-30 DIAGNOSIS — Z87442 Personal history of urinary calculi: Secondary | ICD-10-CM

## 2023-09-30 DIAGNOSIS — R000 Tachycardia, unspecified: Secondary | ICD-10-CM | POA: Diagnosis not present

## 2023-09-30 DIAGNOSIS — R509 Fever, unspecified: Secondary | ICD-10-CM | POA: Diagnosis not present

## 2023-09-30 LAB — CBC WITH DIFFERENTIAL/PLATELET
Abs Immature Granulocytes: 0.12 10*3/uL — ABNORMAL HIGH (ref 0.00–0.07)
Basophils Absolute: 0.1 10*3/uL (ref 0.0–0.1)
Basophils Relative: 0 %
Eosinophils Absolute: 0 10*3/uL (ref 0.0–0.5)
Eosinophils Relative: 0 %
HCT: 34.8 % — ABNORMAL LOW (ref 39.0–52.0)
Hemoglobin: 11.4 g/dL — ABNORMAL LOW (ref 13.0–17.0)
Immature Granulocytes: 1 %
Lymphocytes Relative: 5 %
Lymphs Abs: 0.8 10*3/uL (ref 0.7–4.0)
MCH: 30.7 pg (ref 26.0–34.0)
MCHC: 32.8 g/dL (ref 30.0–36.0)
MCV: 93.8 fL (ref 80.0–100.0)
Monocytes Absolute: 1.3 10*3/uL — ABNORMAL HIGH (ref 0.1–1.0)
Monocytes Relative: 8 %
Neutro Abs: 13.5 10*3/uL — ABNORMAL HIGH (ref 1.7–7.7)
Neutrophils Relative %: 86 %
Platelets: 417 10*3/uL — ABNORMAL HIGH (ref 150–400)
RBC: 3.71 MIL/uL — ABNORMAL LOW (ref 4.22–5.81)
RDW: 14.6 % (ref 11.5–15.5)
WBC: 15.7 10*3/uL — ABNORMAL HIGH (ref 4.0–10.5)
nRBC: 0 % (ref 0.0–0.2)

## 2023-09-30 LAB — COMPREHENSIVE METABOLIC PANEL
ALT: 26 U/L (ref 0–44)
AST: 32 U/L (ref 15–41)
Albumin: 2.8 g/dL — ABNORMAL LOW (ref 3.5–5.0)
Alkaline Phosphatase: 144 U/L — ABNORMAL HIGH (ref 38–126)
Anion gap: 9 (ref 5–15)
BUN: 54 mg/dL — ABNORMAL HIGH (ref 8–23)
CO2: 20 mmol/L — ABNORMAL LOW (ref 22–32)
Calcium: 9 mg/dL (ref 8.9–10.3)
Chloride: 103 mmol/L (ref 98–111)
Creatinine, Ser: 2.34 mg/dL — ABNORMAL HIGH (ref 0.61–1.24)
GFR, Estimated: 28 mL/min — ABNORMAL LOW (ref >60)
Glucose, Bld: 137 mg/dL — ABNORMAL HIGH (ref 70–99)
Potassium: 4 mmol/L (ref 3.5–5.1)
Sodium: 132 mmol/L — ABNORMAL LOW (ref 135–145)
Total Bilirubin: 0.5 mg/dL (ref <1.2)
Total Protein: 7.2 g/dL (ref 6.5–8.1)

## 2023-09-30 LAB — URINALYSIS, W/ REFLEX TO CULTURE (INFECTION SUSPECTED)
Bilirubin Urine: NEGATIVE
Glucose, UA: NEGATIVE mg/dL
Ketones, ur: NEGATIVE mg/dL
Nitrite: NEGATIVE
Protein, ur: 30 mg/dL — AB
Renal Epithelial: 1
Specific Gravity, Urine: 1.011 (ref 1.005–1.030)
pH: 6 (ref 5.0–8.0)

## 2023-09-30 LAB — I-STAT CG4 LACTIC ACID, ED
Lactic Acid, Venous: 0.9 mmol/L (ref 0.5–1.9)
Lactic Acid, Venous: 0.6 mmol/L (ref 0.5–1.9)

## 2023-09-30 LAB — TROPONIN I (HIGH SENSITIVITY): Troponin I (High Sensitivity): 32 ng/L — ABNORMAL HIGH (ref <18)

## 2023-09-30 LAB — CBC
HCT: 31 % — ABNORMAL LOW (ref 39.0–52.0)
Hemoglobin: 10.1 g/dL — ABNORMAL LOW (ref 13.0–17.0)
MCH: 31.1 pg (ref 26.0–34.0)
MCHC: 32.6 g/dL (ref 30.0–36.0)
MCV: 95.4 fL (ref 80.0–100.0)
Platelets: 352 10*3/uL (ref 150–400)
RBC: 3.25 MIL/uL — ABNORMAL LOW (ref 4.22–5.81)
RDW: 14.6 % (ref 11.5–15.5)
WBC: 16.6 10*3/uL — ABNORMAL HIGH (ref 4.0–10.5)
nRBC: 0 % (ref 0.0–0.2)

## 2023-09-30 LAB — PROTIME-INR
INR: 1.2 (ref 0.8–1.2)
Prothrombin Time: 15 s (ref 11.4–15.2)

## 2023-09-30 LAB — CREATININE, SERUM
Creatinine, Ser: 2.11 mg/dL — ABNORMAL HIGH (ref 0.61–1.24)
GFR, Estimated: 32 mL/min — ABNORMAL LOW (ref >60)

## 2023-09-30 LAB — RESP PANEL BY RT-PCR (RSV, FLU A&B, COVID)  RVPGX2
Influenza A by PCR: NEGATIVE
Influenza B by PCR: NEGATIVE
Resp Syncytial Virus by PCR: NEGATIVE
SARS Coronavirus 2 by RT PCR: NEGATIVE

## 2023-09-30 MED ORDER — ENOXAPARIN SODIUM 30 MG/0.3ML IJ SOSY
30.0000 mg | PREFILLED_SYRINGE | INTRAMUSCULAR | Status: DC
  Administered 2023-10-01 – 2023-10-03 (×3): 30 mg via SUBCUTANEOUS
  Filled 2023-09-30 (×3): qty 0.3

## 2023-09-30 MED ORDER — FLUTICASONE FUROATE-VILANTEROL 100-25 MCG/ACT IN AEPB
1.0000 | INHALATION_SPRAY | Freq: Every day | RESPIRATORY_TRACT | Status: DC
  Administered 2023-10-01 – 2023-10-03 (×3): 1 via RESPIRATORY_TRACT
  Filled 2023-09-30: qty 28

## 2023-09-30 MED ORDER — LINACLOTIDE 145 MCG PO CAPS
145.0000 ug | ORAL_CAPSULE | Freq: Every day | ORAL | Status: DC
  Administered 2023-10-01 – 2023-10-03 (×3): 145 ug via ORAL
  Filled 2023-09-30 (×3): qty 1

## 2023-09-30 MED ORDER — OXCARBAZEPINE ER 600 MG PO TB24
600.0000 mg | ORAL_TABLET | Freq: Every day | ORAL | Status: DC
  Administered 2023-10-02: 600 mg via ORAL
  Filled 2023-09-30: qty 1

## 2023-09-30 MED ORDER — SODIUM CHLORIDE 0.9 % IV SOLN
1.0000 g | INTRAVENOUS | Status: DC
  Administered 2023-10-01 – 2023-10-02 (×2): 1 g via INTRAVENOUS
  Filled 2023-09-30 (×2): qty 10

## 2023-09-30 MED ORDER — POLYETHYLENE GLYCOL 3350 17 G PO PACK: 17.0000 g | PACK | Freq: Every day | ORAL | Status: DC | PRN

## 2023-09-30 MED ORDER — SODIUM CHLORIDE 0.9 % IV SOLN
1.0000 g | Freq: Once | INTRAVENOUS | Status: AC
  Administered 2023-09-30: 1 g via INTRAVENOUS
  Filled 2023-09-30: qty 10

## 2023-09-30 MED ORDER — LACTATED RINGERS IV SOLN: INTRAVENOUS | Status: DC

## 2023-09-30 MED ORDER — PRIMIDONE 50 MG PO TABS
100.0000 mg | ORAL_TABLET | Freq: Every day | ORAL | Status: DC
  Administered 2023-09-30 – 2023-10-02 (×3): 100 mg via ORAL
  Filled 2023-09-30 (×3): qty 2

## 2023-09-30 MED ORDER — LEVETIRACETAM 500 MG PO TABS
500.0000 mg | ORAL_TABLET | Freq: Three times a day (TID) | ORAL | Status: DC
  Administered 2023-09-30 – 2023-10-03 (×8): 500 mg via ORAL
  Filled 2023-09-30 (×8): qty 1

## 2023-09-30 MED ORDER — FINASTERIDE 5 MG PO TABS
5.0000 mg | ORAL_TABLET | Freq: Every day | ORAL | Status: DC
  Administered 2023-10-01 – 2023-10-03 (×3): 5 mg via ORAL
  Filled 2023-09-30 (×3): qty 1

## 2023-09-30 MED ORDER — ACETAMINOPHEN 325 MG PO TABS
650.0000 mg | ORAL_TABLET | Freq: Four times a day (QID) | ORAL | Status: DC | PRN
  Administered 2023-10-01 – 2023-10-02 (×2): 650 mg via ORAL
  Filled 2023-09-30 (×2): qty 2

## 2023-09-30 MED ORDER — ACETAMINOPHEN 650 MG RE SUPP: 650.0000 mg | Freq: Four times a day (QID) | RECTAL | Status: DC | PRN

## 2023-09-30 MED ORDER — SODIUM CHLORIDE 0.9 % IV BOLUS
30.0000 mL/kg | Freq: Once | INTRAVENOUS | Status: AC
  Administered 2023-09-30: 1428 mL via INTRAVENOUS

## 2023-09-30 MED ORDER — DOXEPIN HCL 25 MG PO CAPS
25.0000 mg | ORAL_CAPSULE | Freq: Every day | ORAL | Status: DC
  Administered 2023-09-30 – 2023-10-02 (×3): 25 mg via ORAL
  Filled 2023-09-30 (×3): qty 1

## 2023-09-30 NOTE — Assessment & Plan Note (Addendum)
Second episode since August 2024.  Patient's previous infectious disease was due to Klebsiella that was resistant to penicillin with clavulanic acid.  At this time lactic acid within normal limits.  Patient looking nontoxic.  Patient has received approximately one 1.5 L of normal saline bolus and getting 150 cc/h of lactated Ringer infusion.  Patient started on ceftriaxone, blood and urine cultures are pending.  Patient reports no new resp symptoms.

## 2023-09-30 NOTE — Assessment & Plan Note (Signed)
Hold amlodipine for now 

## 2023-09-30 NOTE — Assessment & Plan Note (Signed)
Hold celecoxib

## 2023-09-30 NOTE — H&P (Signed)
History and Physical    Patient: Wesley [name] HBZ:[id number] DOB: 1950/12/30 DOA: 09/30/2023 DOS: the patient was seen and examined on 09/30/2023 PCP: [name], MD  Patient coming from: Home  Chief Complaint:  Chief Complaint  Patient presents with   Fever   HPI: Wesley [name] is a 72 y.o. male with medical history significant of diagnosis of bladder cancer treated with cystectomy and ileal conduit creation back in August 2024.  Review of her documentation suggest that this was intended to be a curative treatment with long-term remission intent.  Patient since then had a Klebsiella UTI back in October of this year that was ampicillin resistant.  Patient reports that he has since then also been diagnosed with pleural effusion that is being followed by pulmonology and is suspicious for malignant effusion.  Patient has also lost about at least 20 pounds of weight since his surgery.  Patient is also noticed left lower abdominal subcutaneous swelling in the skin that was initially soft and it has since then become hard.  This has been going on for about 2 weeks.  The skin has begun to erode as it lies just below his belt area and is painful to touch.  Patient was in his usual state of health till earlier this afternoon when he reports an abrupt onset of feeling unwell with associated chills.  Patient took his temperature at home and it was 47 F.  Patient denies any ear pain sore throat nose pain cough shortness of breath dysuria diarrhea vomiting any new rash on skin except for as described above.  Patient presented to ER and was found to be in sepsis.  Patient has been diagnosed with urinary tract infection.  Treated with ceftriaxone.  Patient generally feeling better although not 100% in terms of his strength and vigor.  Medical evaluation is sought Review of Systems: As mentioned in the history of present illness. All other systems reviewed and are negative. Past Medical  History:  Diagnosis Date   Arthritis    Atypical chest pain    cardiologist-- dr Leeann Must;  chronic occasional,  pressure / discomfort, LOV 07/14/21 (as of 06/08/22)   Bladder calculus    BPH with obstruction/lower urinary tract symptoms    Follows with Dr. Kasandra Knudsen, urologist.   Complex partial seizure [facility])    followed by neurologist--- dr d. 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 Daphane Shepherd at [facility] neurology, LOV w/ FNP on 03/21/22 in Epic ( as of 06/08/22)   Generalized abdominal pain    w/  bloating due to UC  take doxepin   GERD (gastroesophageal reflux disease)    H/O urinary retention    History of bladder stone    History of hiatal hernia    History of kidney stones    History of prostate cancer 2010   urologist--- dr pace;   dx 2010,  Gleason 4+3, PSA 5.2;   03-04-2009  s/p radiactive prostate seed implants and external beam radiation   History of repair of hiatal hernia    02-11-2017  s/p lap paraesophageal hernia repair w/ nissen fundoplication   History of skin cancer    Hx of resection of meningioma 02/11/2009   benign right temperal 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 Rhetta Mura (GI);  dx 2010;  current treatment stelara q8wks, LOV 06/07/22 (as of 06/08/22)   Wears glasses    Past Surgical History:  Procedure Laterality Date   BOTOX INJECTION N/A 01/29/2023   Procedure: BOTOX INJECTION 100 UNITS;  Surgeon: Noel Christmas, MD;   Location: [facility];  Service: Urology;  Laterality: N/A;   BRAIN MENINGIOMA EXCISION  02/11/2009   @[facility] ;  craniotomy , right temporal medial sphonoid wing resection   CARPAL TUNNEL RELEASE Bilateral    right 09-05-2016  @[facility] ;   left 2013   COLONOSCOPY  12/2019   COLONOSCOPY  01/2022   CYSTOSCOPY  03/05/2012   Procedure: CYSTOSCOPY;  Surgeon: Antony Haste, MD;  Location: [facility];  Service: Urology;  Laterality: N/A;  Cysto Clot Evacuation and Fulgeration Of [place] [facility])   CYSTOSCOPY WITH INJECTION N/A 05/22/2023   Procedure: CYSTOSCOPY WITH INJECTION OF INDOCYANINE GREEN DYE;  Surgeon: Loletta Parish., MD;  Location: [facility];  Service: Urology;  Laterality: N/A;   CYSTOSCOPY WITH LITHOLAPAXY N/A 03/13/2019   Procedure: CYSTOSCOPY WITH lASER LITHOLAPAXY, DILATION OF STRICTURE;  Surgeon: Ihor Gully, MD;  Location: [facility];  Service: Urology;  Laterality: N/A;   CYSTOSCOPY WITH LITHOLAPAXY N/A 01/02/2022   Procedure: CYSTOSCOPY WITH LITHOTRIPSY;  Surgeon: Noel Christmas, MD;  Location: [facility];  Service: Urology;  Laterality: N/A;   CYSTOSCOPY WITH RETROGRADE PYELOGRAM, URETEROSCOPY AND STENT PLACEMENT Right 03/13/2022   Procedure: CYSTOSCOPY WITH RETROGRADE PYELOGRAM, URETEROSCOPY AND STENT PLACEMENT;  Surgeon: Noel Christmas, MD;  Location: [facility];  Service: Urology;  Laterality: Right;  75 MINS   CYSTOSCOPY WITH RETROGRADE PYELOGRAM, URETEROSCOPY AND STENT PLACEMENT Right 08/07/2022   Procedure: CYSTOSCOPY WITH RETROGRADE PYELOGRAM, URETEROSCOPY AND STENT PLACEMENT, REMOVAL OF NEPHROURETERAL STENT;  Surgeon: Noel Christmas, MD;  Location[facility];  Service: Urology;  Laterality: Right;  75 MINS   CYSTOSCOPY WITH URETHRAL DILATATION N/A 08/23/2020   Procedure: CYSTOSCOPY WITH URETHRAL DILATATION, BLADDER BIOPSY, FULGERATION;  Surgeon: Noel Christmas, MD;  [facility];  Service: Urology;  Laterality: N/A;  1 HR   CYSTOSCOPY WITH URETHRAL DILATATION N/A 03/13/2021   Procedure: CYSTOSCOPY WITH URETHRAL BALLOON DILATATION/ FOLEY CATHETER PLACEMENT;  Surgeon: Noel Christmas, MD;  [facility];  Service: Urology;  Laterality: N/A;   CYSTOSCOPY WITH URETHRAL DILATATION Left 07/08/2009   @[facility] :   with left  RTG   CYSTOSCOPY WITH URETHRAL DILATATION N/A 06/19/2022   Procedure: CYSTOSCOPY WITH OPTILUME URETHRAL DILATATION;  Surgeon: Noel Christmas, MD;  Location: [facility];  Service: Urology;  Laterality: N/A;   CYSTOSCOPY/URETEROSCOPY/HOLMIUM LASER/STENT PLACEMENT Bilateral 01/29/2023   Procedure: DIAGNOSTIC CYSTOSCOPY , LEFT RETROGRADE PYELOGRAM, LEFT URETERAL STENT PLACEMENT, AND STONE BASKETING;  Surgeon: Noel Christmas, MD;  Location: [facility] ;  Service: Urology;  Laterality: Bilateral;   HOLMIUM LASER APPLICATION N/A 08/23/2020   Procedure: HOLMIUM LASER APPLICATION OF URETHRAL STONE;  Surgeon: Noel Christmas, MD;  Location[facility];  Service: Urology;  Laterality: N/A;   HOLMIUM LASER APPLICATION Right 03/13/2022   Procedure: HOLMIUM LASER APPLICATION;  Surgeon: Noel Christmas, MD;  Location: [facility];  Service: Urology;  Laterality: Right;   HOLMIUM LASER APPLICATION Right 08/07/2022   Procedure: HOLMIUM LASER APPLICATION;  Surgeon: Noel Christmas, MD;  [facility];  Service: Urology;  Laterality: Right;   INGUINAL HERNIA REPAIR Right 05/03/2015   @[facility]    INSERTION PROSTATE RADIATION SEED  03/04/2009   @[facility]    IR NEPHROSTOMY PLACEMENT RIGHT  07/18/2022   IR URETERAL STENT RIGHT NEW ACCESS W/O SEP NEPHROSTOMY CATH  02/04/2023   KNEE CARTILAGE SURGERY Left    1980S   LAPAROSCOPIC PARAESOPHAGEAL HERNIA REPAIR  02/11/2017   @[facility] ;  W/  NISSEN FUNDOPLICATION (MESH)   LYMPH NODE DISSECTION Bilateral 05/22/2023   Procedure: LYMPH NODE DISSECTION;  Surgeon:  Loletta Parish., MD;  Location[facility];  Service: Urology;  Laterality: Bilateral;   NEPHROLITHOTOMY Right 03/29/2023   Procedure: NEPHROLITHOTOMY PERCUTANEOUS;  Surgeon: Loletta Parish., MD;  [facility];  Service: Urology;  Laterality: Right;  2 HRS   PROSTATE BIOPSY     ROBOT ASSISTED LAPAROSCOPIC COMPLETE CYSTECT ILEAL CONDUIT N/A 05/22/2023   Procedure: XI ROBOTIC ASSISTED LAPAROSCOPIC COMPLETE CYSTECT ILEAL CONDUIT;  Surgeon: Loletta Parish., MD;  [facility];  Service: Urology;  Laterality: N/A;   ROBOT ASSISTED LAPAROSCOPIC RADICAL PROSTATECTOMY N/A 05/22/2023   Procedure: XI ROBOTIC ASSISTED LAPAROSCOPIC RADICAL PROSTATECTOMY;  Surgeon: Loletta Parish., MD;  [facility];  Service: Urology;  Laterality: N/A;   TOE SURGERY     TRANSURETHRAL RESECTION OF BLADDER TUMOR N/A 01/02/2022   Procedure: TRANSURETHRAL RESECTION OF BLADDER TUMOR (TURBT);  Surgeon: Noel Christmas, MD;  Location[facility];  Service: Urology;  Laterality: N/A;   TRANSURETHRAL RESECTION OF BLADDER TUMOR N/A 01/29/2023   Procedure: TRANSURETHRAL RESECTION OF BLADDER TUMOR (TURBT);  Surgeon: Noel Christmas, MD;  Location: [facility];  Service: Urology;  Laterality: N/A;   Social History:  reports that he quit smoking about 36 years ago. His smoking use included cigarettes. He started smoking about 46 years ago. He has a 10 pack-year smoking history. He has never used smokeless tobacco. He reports current alcohol use. He reports that he does not use drugs.  Allergies  Allergen Reactions   Sulfa Antibiotics Rash    Childhood    History reviewed. No pertinent family history.  Prior to Admission medications   Medication Sig Start Date End Date Taking? Authorizing Provider  amLODipine (NORVASC) 5 MG tablet Take 5 mg by mouth at bedtime.   Yes [provider]  BREO ELLIPTA 100-25 MCG/ACT AEPB INHALE 1 PUFF BY MOUTH EVERY DAY 06/25/23  Yes  Olalere, Adewale A, MD  celecoxib (CELEBREX) 100 MG capsule Take 100 mg by mouth every evening.   Yes [provider]  doxepin (SINEQUAN) 25 MG capsule Take 25 mg by mouth at bedtime.   Yes [provider]  ferrous sulfate 325 (65 FE) MG tablet Take 325 mg by mouth every other day.   Yes [provider]  finasteride (PROSCAR) 5 MG tablet Take 5 mg by mouth daily at 12 noon. 02/05/13  Yes [provider]  ketoconazole (NIZORAL) 2 % cream Apply 1 Application topically daily as needed for irritation.   Yes [provider]  ketoconazole (NIZORAL) 2 % shampoo Apply 1 Application topically daily as needed (yeast).  Yes [provider]  levETIRAcetam (KEPPRA) 500 MG tablet Take 500 mg by mouth 3 (three) times daily.   Yes [provider]  linaclotide (LINZESS) 145 MCG CAPS capsule Take 145 mcg by mouth daily. 09/26/23 03/24/24 Yes [provider]  Meclizine HCl 25 MG CHEW Chew 1 tablet by mouth every 6 (six) hours as needed (dizziness).   Yes [provider]  OXcarbazepine ER (OXTELLAR XR) 600 MG TB24 Take 600 mg by mouth daily in the afternoon.   Yes [provider]  polyethylene glycol (MIRALAX / GLYCOLAX) 17 g packet Take 17 g by mouth 2 (two) times daily. Patient taking differently: Take 17 g by mouth daily as needed for moderate constipation. 07/25/23  Yes Glade Lloyd, MD  primidone (MYSOLINE) 50 MG tablet Take 100 mg by mouth at bedtime.   Yes [provider]  vitamin B-12 (CYANOCOBALAMIN) 500 MCG tablet Take 500 mcg by mouth daily.   Yes [provider]  losartan (COZAAR) 50 MG tablet Take 50 mg by mouth daily. Patient not taking: Reported on 09/30/2023    [provider]  mirtazapine (REMERON) 15 MG tablet Take 1 tablet (15 mg total) by mouth at bedtime. Patient not taking: Reported on 09/30/2023 09/23/23   Tomma Lightning, MD  ustekinumab (STELARA) 90 MG/ML SOSY injection  Inject 90 mg into the skin every 8 (eight) weeks.    [provider]    Physical Exam: Vitals:   09/30/23 1806 09/30/23 1923 09/30/23 1930 09/30/23 2045  BP: (!) 159/94 (!) 120/95 (!) 140/74 (!) 146/87  Pulse: (!) 114 (!) 105 (!) 104 (!) 104  Resp: (!) 24 16 13 17   Temp: (!) 101 F (38.3 C)     TempSrc: Oral     SpO2: 100% 98% 98% 97%  Weight: 47.6 kg     Height: 5\' 4"  (1.626 m)      General: Thin appearing gentleman appears to be in no distress.  Wife at the bedside Respiratory exam: Right basilar crackles otherwise vesicular air entry Cardiovascular exam S1-S2 normal Abdomen all quadrants are soft nontender right lower quadrant ileostomy bag in place with catheter draining tan-colored urine.  Left lower quadrant subcutaneous nodule irregular shape adherent to skin with overlying skin slightly eroded and erythematous in subcentimeter fashion.  But the nodule itself is at least 4 cm x 4 cm irregular.  Noncompressible nonpulsatile pulsatile Extremities warm without edema no focal motor deficit. Data Reviewed:  Labs on Admission:  Results for orders placed or performed during the hospital encounter of 09/30/23 (from the past 24 hours)  Comprehensive metabolic panel     Status: Abnormal   Collection Time: 09/30/23  6:31 PM  Result Value Ref Range   Sodium 132 (L) 135 - 145 mmol/L   Potassium 4.0 3.5 - 5.1 mmol/L   Chloride 103 98 - 111 mmol/L   CO2 20 (L) 22 - 32 mmol/L   Glucose, Bld 137 (H) 70 - 99 mg/dL   BUN 54 (H) 8 - 23 mg/dL   Creatinine, Ser 4.09 (H) 0.61 - 1.24 mg/dL   Calcium 9.0 8.9 - 81.1 mg/dL   Total Protein 7.2 6.5 - 8.1 g/dL   Albumin 2.8 (L) 3.5 - 5.0 g/dL   AST 32 15 - 41 U/L   ALT 26 0 - 44 U/L   Alkaline Phosphatase 144 (H) 38 - 126 U/L   Total Bilirubin 0.5 <1.2 mg/dL   GFR, Estimated 28 (L) >60 mL/min   Anion  gap 9 5 - 15  CBC with Differential     Status: Abnormal   Collection Time: 09/30/23  6:31 PM  Result Value Ref Range   WBC 15.7 (H)  4.0 - 10.5 K/uL   RBC 3.71 (L) 4.22 - 5.81 MIL/uL   Hemoglobin 11.4 (L) 13.0 - 17.0 g/dL   HCT 24.4 (L) 01.0 - 27.2 %   MCV 93.8 80.0 - 100.0 fL   MCH 30.7 26.0 - 34.0 pg   MCHC 32.8 30.0 - 36.0 g/dL   RDW 53.6 64.4 - 03.4 %   Platelets 417 (H) 150 - 400 K/uL   nRBC 0.0 0.0 - 0.2 %   Neutrophils Relative % 86 %   Neutro Abs 13.5 (H) 1.7 - 7.7 K/uL   Lymphocytes Relative 5 %   Lymphs Abs 0.8 0.7 - 4.0 K/uL   Monocytes Relative 8 %   Monocytes Absolute 1.3 (H) 0.1 - 1.0 K/uL   Eosinophils Relative 0 %   Eosinophils Absolute 0.0 0.0 - 0.5 K/uL   Basophils Relative 0 %   Basophils Absolute 0.1 0.0 - 0.1 K/uL   Immature Granulocytes 1 %   Abs Immature Granulocytes 0.12 (H) 0.00 - 0.07 K/uL  Protime-INR     Status: None   Collection Time: 09/30/23  6:31 PM  Result Value Ref Range   Prothrombin Time 15.0 11.4 - 15.2 seconds   INR 1.2 0.8 - 1.2  I-Stat Lactic Acid     Status: None   Collection Time: 09/30/23  6:45 PM  Result Value Ref Range   Lactic Acid, Venous 0.9 0.5 - 1.9 mmol/L  Resp panel by RT-PCR (RSV, Flu A&B, Covid) Anterior Nasal Swab     Status: None   Collection Time: 09/30/23  6:55 PM   Specimen: Anterior Nasal Swab  Result Value Ref Range   SARS Coronavirus 2 by RT PCR NEGATIVE NEGATIVE   Influenza A by PCR NEGATIVE NEGATIVE   Influenza B by PCR NEGATIVE NEGATIVE   Resp Syncytial Virus by PCR NEGATIVE NEGATIVE  Urinalysis, w/ Reflex to Culture (Infection Suspected) -Urine, Clean Catch     Status: Abnormal   Collection Time: 09/30/23  7:25 PM  Result Value Ref Range   Specimen Source URINE, CLEAN CATCH    Color, Urine YELLOW YELLOW   APPearance CLOUDY (A) CLEAR   Specific Gravity, Urine 1.011 1.005 - 1.030   pH 6.0 5.0 - 8.0   Glucose, UA NEGATIVE NEGATIVE mg/dL   Hgb urine dipstick SMALL (A) NEGATIVE   Bilirubin Urine NEGATIVE NEGATIVE   Ketones, ur NEGATIVE NEGATIVE mg/dL   Protein, ur 30 (A) NEGATIVE mg/dL   Nitrite NEGATIVE NEGATIVE   Leukocytes,Ua LARGE  (A) NEGATIVE   RBC / HPF 0-5 0 - 5 RBC/hpf   WBC, UA 21-50 0 - 5 WBC/hpf   Bacteria, UA MANY (A) NONE SEEN   Squamous Epithelial / HPF 0-5 0 - 5 /HPF   Renal Epithelial 1    Mucus PRESENT   I-Stat Lactic Acid     Status: None   Collection Time: 09/30/23  8:54 PM  Result Value Ref Range   Lactic Acid, Venous 0.6 0.5 - 1.9 mmol/L   Basic Metabolic Panel: Recent Labs  Lab 09/30/23 1831  NA 132*  K 4.0  CL 103  CO2 20*  GLUCOSE 137*  BUN 54*  CREATININE 2.34*  CALCIUM 9.0   Liver Function Tests: Recent Labs  Lab 09/30/23 1831  AST 32  ALT 26  ALKPHOS 144*  BILITOT 0.5  PROT 7.2  ALBUMIN 2.8*   No results for input(s): "LIPASE", "AMYLASE" in the last 168 hours. No results for input(s): "AMMONIA" in the last 168 hours. CBC: Recent Labs  Lab 09/30/23 1831  WBC 15.7*  NEUTROABS 13.5*  HGB 11.4*  HCT 34.8*  MCV 93.8  PLT 417*   Cardiac Enzymes: No results for input(s): "CKTOTAL", "CKMB", "CKMBINDEX", "TROPONINIHS" in the last 168 hours.  BNP (last 3 results) No results for input(s): "PROBNP" in the last 8760 hours. CBG: No results for input(s): "GLUCAP" in the last 168 hours.  Radiological Exams on Admission:  CT ABDOMEN PELVIS WO CONTRAST Result Date: 09/30/2023 CLINICAL DATA:  History of bladder cancer status post cystectomy with fever. * Tracking Code: BO * EXAM: CT ABDOMEN AND PELVIS WITHOUT CONTRAST TECHNIQUE: Multidetector CT imaging of the abdomen and pelvis was performed following the standard protocol without IV contrast. RADIATION DOSE REDUCTION: This exam was performed according to the departmental dose-optimization program which includes automated exposure control, adjustment of the mA and/or kV according to patient size and/or use of iterative reconstruction technique. COMPARISON:  CT abdomen and pelvis dated 09/17/2023 and multiple priors FINDINGS: Lower chest: Chronic right pleural effusion with adjacent medial right lower lobe rounded density,  likely round atelectasis. Additional subsegmental atelectasis in the middle lobe and lingula. Irregular basilar right lower lobe nodule measuring 1.3 x 0.7 cm (5:23), increased in size from 5 x 4 mm on 07/22/2023. Unchanged right lower lobe perifissural nodules. Partially imaged heart size is normal. Coronary artery calcifications. Hepatobiliary: Ill-defined 1.8 cm hypodensity in segment 4/8 (2:13), incompletely characterized, but appears new compared to more remote studies. No intra or extrahepatic biliary ductal dilation. Gallbladder is contracted. Pancreas: No focal lesions or main ductal dilation. Spleen: Normal in size without focal abnormality. Adrenals/Urinary Tract: No adrenal nodules. Persistent bilateral hydroureteronephrosis. Asymmetric right renal atrophy. Multifocal bilateral cysts are again seen including peripherally calcified right lower pole cysts. No renal calculi. The ureters are not well seen in the absence of contrast. Prior cystectomy. Right lower quadrant urostomy. Stomach/Bowel: Normal appearance of the stomach. No evidence of bowel wall thickening, distention, or inflammatory changes. Moderate volume stool throughout the colon. Appendix is not discretely seen. Vascular/Lymphatic: Aortic atherosclerosis. No enlarged abdominal or pelvic lymph nodes. Reproductive: Prostate brachytherapy seeds. Other: No free fluid, fluid collection, or free air. Musculoskeletal: Similar to slightly increased lytic changes of the left sacral ala. New lytic changes involving the left iliac bone adjacent to the sacroiliac joint. Degenerative changes of the hips, left-greater-than-right. Multilevel degenerative changes of the partially imaged thoracic and lumbar spine. IMPRESSION: 1. Ill-defined 1.8 cm hypodensity in segment 4/8 of the liver, incompletely characterized, but appears new compared to more remote studies. Findings are suspicious for metastatic disease. Recommend further evaluation with  contrast-enhanced MRI of the abdomen. Alternatively, attention on follow-up. 2. Similar to slightly increased lytic changes of the left sacral ala. New lytic changes involving the left iliac bone adjacent to the sacroiliac joint. 3. Irregular basilar right lower lobe nodule measuring 1.3 x 0.7 cm, increased in size from 5 x 4 mm on 07/22/2023, suspicious for metastatic disease. 4. Postsurgical change from cystectomy with right lower quadrant ostomy. Persistent bilateral hydroureteronephrosis. Ureters are not well evaluated in the absence of contrast. 5. Aortic Atherosclerosis (ICD10-I70.0). Coronary artery calcifications. Assessment for potential risk factor modification, dietary therapy or pharmacologic therapy may be warranted, if clinically indicated. Electronically Signed   By: Milus Height.D.  On: 09/30/2023 20:28   DG Chest Port 1 View Result Date: 09/30/2023 CLINICAL DATA:  Fever.  Bladder cancer removed in August. EXAM: PORTABLE CHEST 1 VIEW COMPARISON:  09/02/2023 FINDINGS: The heart size and mediastinal contours are within normal limits. Both lungs are clear. The visualized skeletal structures are unremarkable. IMPRESSION: No active disease. Electronically Signed   By: Burman Nieves M.D.   On: 09/30/2023 20:11    EKG: Independently reviewed. Sinus tachy. Artifact in V2.   Assessment and Plan: * Sepsis secondary to UTI [facility]) Second episode since August 2024.  Patient's previous infectious disease was due to Klebsiella that was resistant to penicillin with clavulanic acid.  At this time lactic acid within normal limits.  Patient looking nontoxic.  Patient has received approximately one 1.5 L of normal saline bolus and getting 150 cc/h of lactated Ringer infusion.  Patient started on ceftriaxone, blood and urine cultures are pending.  Patient reports no new resp symptoms.  Ulcerative colitis (HCC) Previously documented in chart, patient has previously taken Ustekinumab for same.  Currently  not in flare.  Patient's Biologics are currently on hold per patient  CKD (chronic kidney disease) stage 4, GFR 15-29 ml/min (HCC) Hold celecoxib  HTN (hypertension) Hold amlodipine for now  Complex partial seizures (HCC) Previously documented.  Continue with Keppra, oxcarbazepine, primidone  Bladder cancer (HCC) This was diagnosed back in August 2024, it was treated with robotic cystoprostatectomy with node dissection and conduit diversion on May 22, 2023.  It this was intended to be curative surgery.  Unfortunately patient since then has developed partially loculated effusion as seen on outpatient pulmonology notes.  Also CAT scan today is showing several metastatic appearing nodules.  In the abdomen.  One of the nodules is actually subcutaneous in the left lower quadrant of the abdomen.  I will request IR to help [place] out with this nodule.. discussed with the patient and wife at bedside.. patient also has lytic changes of the bone.  Patient also has persistent bilateral hydroureteronephrosis.  Based on record review urology last saw the patient on July 25, 2023 and seems to have been aware of the hydronephrosis at least on the left side.  Shared decision making was done with the patient to continue to monitor this clinically.  Patient's creatinine appears to be at baseline at this time.  At this time no costovertebral angle tenderness to suggest pyelonephritis.  I will send a message to Dr. Sebastian Ache via secure messaging in epic to consider rounding on the patient in the morning.      Advance Care Planning:   Code Status: Prior full code  Consults: I sent a message to Urology as above.  Family Communication: wife at bedside. All questions answered, concern was raised fi biopsy of mass could potentially spread cancer. Advised that in this clinical circumstance, spread of ca would not be clinically significant as it would already be considered metastatic and diagnosis would be  valuable.  Severity of Illness: The appropriate patient status for this patient is INPATIENT. Inpatient status is judged to be reasonable and necessary in order to provide the required intensity of service to ensure the patient's safety. The patient's presenting symptoms, physical exam findings, and initial radiographic and laboratory data in the context of their chronic comorbidities is felt to place them at high risk for further clinical deterioration. Furthermore, it is not anticipated that the patient will be medically stable for discharge from the hospital within 2 midnights of admission.   *  I certify that at the point of admission it is my clinical judgment that the patient will require inpatient hospital care spanning beyond 2 midnights from the point of admission due to high intensity of service, high risk for further deterioration and high frequency of surveillance required.*  Author: Nolberto Hanlon, MD 09/30/2023 9:37 PM  For on call review [email or web address].

## 2023-09-30 NOTE — Assessment & Plan Note (Signed)
Previously documented.  Continue with Keppra, oxcarbazepine, primidone

## 2023-09-30 NOTE — Assessment & Plan Note (Addendum)
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  It this was intended to be curative surgery.  Unfortunately patient since then has developed partially loculated effusion as seen on outpatient pulmonology notes.  Also CAT scan today is showing several metastatic appearing nodules.  In the abdomen.  One of the nodules is actually subcutaneous in the left lower quadrant of the abdomen.  I will request IR to help [place] out with this nodule.. discussed with the patient and wife at bedside.. patient also has lytic changes of the bone.  Patient also has persistent bilateral hydroureteronephrosis.  Based on record review urology last saw the patient on July 25, 2023 and seems to have been aware of the hydronephrosis at least on the left side.  Shared decision making was done with the patient to continue to monitor this clinically.  Patient's creatinine appears to be at baseline at this time.  At this time no costovertebral angle tenderness to suggest pyelonephritis.  I will send a message to Dr. Sebastian Ache via secure messaging in epic to consider rounding on the patient in the morning.

## 2023-09-30 NOTE — ED Notes (Signed)
Patient transported to CT 

## 2023-09-30 NOTE — ED Provider Notes (Signed)
[place] EMERGENCY DEPARTMENT AT [facility] Provider Note  CSN: [id number] Arrival date & time: 09/30/23 1758  Chief Complaint(s) Fever  HPI Wesley [name] is a 72 y.o. male history of seizure disorder, bladder cancer status post cystectomy and urostomy, hypertension, ulcerative colitis, CKD, presenting with fever.  Patient reports fever started today.  He reports generalized weakness as well.  Denies any abdominal pain but reports some left lower quadrant abdominal discomfort.  No nausea or vomiting.  No cough or shortness of breath.  No headaches.  No leg swelling.  No rashes.  He does feel similar to when he has been diagnosed with a urine infection previously.  Has not noticed any change in his urostomy output.  Has noticed a little bit of swelling in his left lower quadrant where he previously had a surgical drain.  Had a CT scan around 5 days ago which reportedly did not show any acute process   Past Medical History Past Medical History:  Diagnosis Date   Arthritis    Atypical chest pain    cardiologist-- dr Leeann Must;  chronic occasional,  pressure / discomfort, LOV 07/14/21 (as of 06/08/22)   Bladder calculus    BPH with obstruction/lower urinary tract symptoms    Follows with Dr. Kasandra Knudsen, urologist.   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 Daphane Shepherd at [facility] neurology, LOV w/ FNP on 03/21/22 in Epic ( as of 06/08/22)   Generalized abdominal pain    w/  bloating due to UC  take doxepin   GERD (gastroesophageal reflux disease)    H/O urinary retention    History of bladder stone    History of hiatal hernia    History of kidney stones    History of prostate cancer 2010   urologist--- dr pace;    dx 2010,  Gleason 4+3, PSA 5.2;   03-04-2009  s/p radiactive prostate seed implants and external beam radiation   History of repair of hiatal hernia    02-11-2017  s/p lap paraesophageal hernia repair w/ nissen fundoplication   History of skin cancer    Hx of resection of meningioma 02/11/2009   benign right temperal lobe meningioma s/p resection  (residual hearing loss)   (neurosurgeon-- dr Tyrone Sage)   Hypertension    followed by cardiologist and pcp   Insomnia    Migraine with aura and without status migrainosus, not intractable    neurologist-- dr Marlena Clipper   Prostate cancer [facility])    Simple renal cyst    bilateral ;   followed by urologist   Ulcerative colitis (HCC)    followed by dr v. Rhetta Mura (GI);  dx 2010;  current treatment stelara q8wks, LOV 06/07/22 (as of 06/08/22)   Wears glasses    Patient Active Problem List   Diagnosis Date Noted   CKD (chronic kidney disease) stage 4, GFR 15-29 ml/min (HCC) 09/30/2023   Ulcerative colitis (HCC) 09/30/2023   UTI (urinary tract infection) 09/30/2023   Constipation 07/23/2023   CKD stage 3b, GFR 30-44 ml/min (HCC) 07/22/2023   Complex partial seizures (HCC) 07/22/2023   Sepsis secondary to UTI (HCC) 07/22/2023   HTN (hypertension) 07/22/2023   Protein-calorie malnutrition, severe 07/22/2023  Bladder cancer (HCC) 05/22/2023   Surgical counseling visit 05/20/2023   Renal stone 03/29/2023   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 Authorizing Provider  amLODipine (NORVASC) 5 MG tablet Take 5 mg by mouth at bedtime.   Yes [provider]  BREO ELLIPTA 100-25 MCG/ACT AEPB INHALE 1 PUFF BY MOUTH EVERY DAY 06/25/23  Yes Olalere, Adewale A, MD  celecoxib (CELEBREX) 100 MG capsule Take 100 mg by mouth every evening.   Yes [provider]   doxepin (SINEQUAN) 25 MG capsule Take 25 mg by mouth at bedtime.   Yes [provider]  ferrous sulfate 325 (65 FE) MG tablet Take 325 mg by mouth every other day.   Yes [provider]  finasteride (PROSCAR) 5 MG tablet Take 5 mg by mouth daily at 12 noon. 02/05/13  Yes [provider]  ketoconazole (NIZORAL) 2 % cream Apply 1 Application topically daily as needed for irritation.   Yes [provider]  ketoconazole (NIZORAL) 2 % shampoo Apply 1 Application topically daily as needed (yeast).   Yes [provider]  levETIRAcetam (KEPPRA) 500 MG tablet Take 500 mg by mouth 3 (three) times daily.   Yes [provider]  linaclotide (LINZESS) 145 MCG CAPS capsule Take 145 mcg by mouth daily. 09/26/23 03/24/24 Yes [provider]  Meclizine HCl 25 MG CHEW Chew 1 tablet by mouth every 6 (six) hours as needed (dizziness).   Yes [provider]  OXcarbazepine ER (OXTELLAR XR) 600 MG TB24 Take 600 mg by mouth daily in the afternoon.   Yes [provider]  polyethylene glycol (MIRALAX / GLYCOLAX) 17 g packet Take 17 g by mouth 2 (two) times daily. Patient taking differently: Take 17 g by mouth daily as needed for moderate constipation. 07/25/23  Yes Glade Lloyd, MD  primidone (MYSOLINE) 50 MG tablet Take 100 mg by mouth at bedtime.   Yes [provider]  vitamin B-12 (CYANOCOBALAMIN) 500 MCG tablet Take 500 mcg by mouth daily.   Yes [provider]  losartan (COZAAR) 50 MG tablet Take 50 mg by mouth daily. Patient not taking: Reported on 09/30/2023    [provider]  mirtazapine (REMERON) 15 MG tablet Take 1 tablet (15 mg total) by mouth at bedtime. Patient not taking: Reported on 09/30/2023 09/23/23   Tomma Lightning, MD  ustekinumab (STELARA) 90 MG/ML SOSY injection Inject 90 mg into the skin every 8 (eight) weeks.    [provider]                                                                                                                                     Past Surgical History Past Surgical History:  Procedure Laterality Date  BOTOX INJECTION N/A 01/29/2023   Procedure: BOTOX INJECTION 100 UNITS;  Surgeon: Noel Christmas, MD;  Location: [facility];  Service: Urology;  Laterality: N/A;   BRAIN MENINGIOMA EXCISION  02/11/2009   @[facility] ;  craniotomy , right temporal medial sphonoid wing resection   CARPAL TUNNEL RELEASE Bilateral    right 09-05-2016  @[facility] ;   left 2013   COLONOSCOPY  12/2019   COLONOSCOPY  01/2022   CYSTOSCOPY  03/05/2012   Procedure: CYSTOSCOPY;  Surgeon: Antony Haste, MD;  Location: [facility];  Service: Urology;  Laterality: N/A;  Cysto Clot Evacuation and Fulgeration Of [place] ([place])   CYSTOSCOPY WITH INJECTION N/A 05/22/2023   Procedure: CYSTOSCOPY WITH INJECTION OF INDOCYANINE GREEN DYE;  Surgeon: Loletta Parish., MD;  Location: [facility];  Service: Urology;  Laterality: N/A;   CYSTOSCOPY WITH LITHOLAPAXY N/A 03/13/2019   Procedure: CYSTOSCOPY WITH lASER LITHOLAPAXY, DILATION OF STRICTURE;  Surgeon: Ihor Gully, MD;  Location: [facility];  Service: Urology;  Laterality: N/A;   CYSTOSCOPY WITH LITHOLAPAXY N/A 01/02/2022   Procedure: CYSTOSCOPY WITH LITHOTRIPSY;  Surgeon: Noel Christmas, MD;  Location: [facility];  Service: Urology;  Laterality: N/A;   CYSTOSCOPY WITH RETROGRADE PYELOGRAM, URETEROSCOPY AND STENT PLACEMENT Right 03/13/2022   Procedure: CYSTOSCOPY WITH RETROGRADE PYELOGRAM, URETEROSCOPY AND STENT PLACEMENT;  Surgeon: Noel Christmas, MD;  Location: [facility];  Service: Urology;  Laterality: Right;  75 MINS   CYSTOSCOPY WITH RETROGRADE PYELOGRAM, URETEROSCOPY AND STENT PLACEMENT Right 08/07/2022   Procedure: CYSTOSCOPY WITH RETROGRADE PYELOGRAM, URETEROSCOPY AND STENT PLACEMENT, REMOVAL OF NEPHROURETERAL STENT;  Surgeon: Noel Christmas, MD;   Location: [facility];  Service: Urology;  Laterality: Right;  75 MINS   CYSTOSCOPY WITH URETHRAL DILATATION N/A 08/23/2020   Procedure: CYSTOSCOPY WITH URETHRAL DILATATION, BLADDER BIOPSY, FULGERATION;  Surgeon: Noel Christmas, MD;  Location: [facility];  Service: Urology;  Laterality: N/A;  1 HR   CYSTOSCOPY WITH URETHRAL DILATATION N/A 03/13/2021   Procedure: CYSTOSCOPY WITH URETHRAL BALLOON DILATATION/ FOLEY CATHETER PLACEMENT;  Surgeon: Noel Christmas, MD;  Location: [facility];  Service: Urology;  Laterality: N/A;   CYSTOSCOPY WITH URETHRAL DILATATION Left 07/08/2009   @[facility] :   with left  RTG   CYSTOSCOPY WITH URETHRAL DILATATION N/A 06/19/2022   Procedure: CYSTOSCOPY WITH OPTILUME URETHRAL DILATATION;  Surgeon: Noel Christmas, MD;  Location: [facility];  Service: Urology;  Laterality: N/A;   CYSTOSCOPY/URETEROSCOPY/HOLMIUM LASER/STENT PLACEMENT Bilateral 01/29/2023   Procedure: DIAGNOSTIC CYSTOSCOPY , LEFT RETROGRADE PYELOGRAM, LEFT URETERAL STENT PLACEMENT, AND STONE BASKETING;  Surgeon: Noel Christmas, MD;  Location: [facility];  Service: Urology;  Laterality: Bilateral;   HOLMIUM LASER APPLICATION N/A 08/23/2020   Procedure: HOLMIUM LASER APPLICATION OF URETHRAL STONE;  Surgeon: Noel Christmas, MD;  Location: [facility];  Service: Urology;  Laterality: N/A;   HOLMIUM LASER APPLICATION Right 03/13/2022   Procedure: HOLMIUM LASER APPLICATION;  Surgeon: Noel Christmas, MD;  Location: [facility];  Service: Urology;  Laterality: Right;   HOLMIUM LASER APPLICATION Right 08/07/2022   Procedure: HOLMIUM LASER APPLICATION;  Surgeon: Noel Christmas, MD;  Location: [facility];  Service: Urology;  Laterality: Right;   INGUINAL HERNIA REPAIR Right 05/03/2015   @[facility]    INSERTION PROSTATE RADIATION SEED  03/04/2009   @[facility]    IR NEPHROSTOMY PLACEMENT RIGHT  07/18/2022   IR URETERAL STENT RIGHT NEW ACCESS W/O SEP  NEPHROSTOMY  CATH  02/04/2023   KNEE CARTILAGE SURGERY Left    1980S   LAPAROSCOPIC PARAESOPHAGEAL HERNIA REPAIR  02/11/2017   @[facility] ;  W/  NISSEN FUNDOPLICATION (MESH)   LYMPH NODE DISSECTION Bilateral 05/22/2023   Procedure: LYMPH NODE DISSECTION;  Surgeon: Loletta Parish., MD;  Location: [facility];  Service: Urology;  Laterality: Bilateral;   NEPHROLITHOTOMY Right 03/29/2023   Procedure: NEPHROLITHOTOMY PERCUTANEOUS;  Surgeon: Loletta Parish., MD;  Location: [facility];  Service: Urology;  Laterality: Right;  2 HRS   PROSTATE BIOPSY     ROBOT ASSISTED LAPAROSCOPIC COMPLETE CYSTECT ILEAL CONDUIT N/A 05/22/2023   Procedure: XI ROBOTIC ASSISTED LAPAROSCOPIC COMPLETE CYSTECT ILEAL CONDUIT;  Surgeon: Loletta Parish., MD;  Location[facility];  Service: Urology;  Laterality: N/A;   ROBOT ASSISTED LAPAROSCOPIC RADICAL PROSTATECTOMY N/A 05/22/2023   Procedure: XI ROBOTIC ASSISTED LAPAROSCOPIC RADICAL PROSTATECTOMY;  Surgeon: Loletta Parish., MD;  Location: [facility];  Service: Urology;  Laterality: N/A;   TOE SURGERY     TRANSURETHRAL RESECTION OF BLADDER TUMOR N/A 01/02/2022   Procedure: TRANSURETHRAL RESECTION OF BLADDER TUMOR (TURBT);  Surgeon: Noel Christmas, MD;  Location: [facility];  Service: Urology;  Laterality: N/A;   TRANSURETHRAL RESECTION OF BLADDER TUMOR N/A 01/29/2023   Procedure: TRANSURETHRAL RESECTION OF BLADDER TUMOR (TURBT);  Surgeon: Noel Christmas, MD;  Location: [facility];  Service: Urology;  Laterality: N/A;   Family History History reviewed. No pertinent family history.  Social History Social History   Tobacco Use   Smoking status: Former    Current packs/day: 0.00    Average packs/day: 1 pack/day for 10.0 years (10.0 ttl pk-yrs)    Types: Cigarettes    Start date: 03/01/1977    Quit date: 03/02/1987    Years since quitting: 36.6   Smokeless tobacco: Never  Vaping Use   Vaping status: Never Used  Substance Use Topics    Alcohol use: Yes    Comment: Rare   Drug use: Never   Allergies Sulfa antibiotics  Review of Systems Review of Systems  All other systems reviewed and are negative.   Physical Exam Vital Signs  I have reviewed the triage vital signs BP (!) 146/87   Pulse (!) 104   Temp (!) 101 F (38.3 C) (Oral)   Resp 17   Ht 5\' 4"  (1.626 m)   Wt 47.6 kg   SpO2 97%   BMI 18.02 kg/m  Physical Exam Vitals and nursing note reviewed.  Constitutional:      General: He is not in acute distress.    Appearance: Normal appearance.  HENT:     Mouth/Throat:     Mouth: Mucous membranes are moist.  Eyes:     Conjunctiva/sclera: Conjunctivae normal.  Cardiovascular:     Rate and Rhythm: Normal rate and regular rhythm.  Pulmonary:     Effort: Pulmonary effort is normal. No respiratory distress.     Breath sounds: Normal breath sounds.  Abdominal:     General: Abdomen is flat.     Palpations: Abdomen is soft.     Tenderness: There is abdominal tenderness (LLQ, small area of firmness at prior drain site).  Musculoskeletal:     Right lower leg: No edema.     Left lower leg: No edema.  Skin:    General: Skin is warm and dry.     Capillary Refill: Capillary refill takes less than 2 seconds.  Neurological:  Mental Status: He is alert and oriented to person, place, and time. Mental status is at baseline.  Psychiatric:        Mood and Affect: Mood normal.        Behavior: Behavior normal.     ED Results and Treatments Labs (all labs ordered are listed, but only abnormal results are displayed) Labs Reviewed  COMPREHENSIVE METABOLIC PANEL - Abnormal; Notable for the following components:      Result Value   Sodium 132 (*)    CO2 20 (*)    Glucose, Bld 137 (*)    BUN 54 (*)    Creatinine, Ser 2.34 (*)    Albumin 2.8 (*)    Alkaline Phosphatase 144 (*)    GFR, Estimated 28 (*)    All other components within normal limits  CBC WITH DIFFERENTIAL/PLATELET - Abnormal; Notable for the  following components:   WBC 15.7 (*)    RBC 3.71 (*)    Hemoglobin 11.4 (*)    HCT 34.8 (*)    Platelets 417 (*)    Neutro Abs 13.5 (*)    Monocytes Absolute 1.3 (*)    Abs Immature Granulocytes 0.12 (*)    All other components within normal limits  URINALYSIS, W/ REFLEX TO CULTURE (INFECTION SUSPECTED) - Abnormal; Notable for the following components:   APPearance CLOUDY (*)    Hgb urine dipstick SMALL (*)    Protein, ur 30 (*)    Leukocytes,Ua LARGE (*)    Bacteria, UA MANY (*)    All other components within normal limits  RESP PANEL BY RT-PCR (RSV, FLU A&B, COVID)  RVPGX2  CULTURE, BLOOD (ROUTINE X 2)  CULTURE, BLOOD (ROUTINE X 2)  URINE CULTURE  PROTIME-INR  CBC  CREATININE, SERUM  APTT  PROTIME-INR  BASIC METABOLIC PANEL  CBC  I-STAT CG4 LACTIC ACID, ED  I-STAT CG4 LACTIC ACID, ED  TROPONIN I (HIGH SENSITIVITY)                                                                                                                          Radiology CT ABDOMEN PELVIS WO CONTRAST Result Date: 09/30/2023 CLINICAL DATA:  History of bladder cancer status post cystectomy with fever. * Tracking Code: BO * EXAM: CT ABDOMEN AND PELVIS WITHOUT CONTRAST TECHNIQUE: Multidetector CT imaging of the abdomen and pelvis was performed following the standard protocol without IV contrast. RADIATION DOSE REDUCTION: This exam was performed according to the departmental dose-optimization program which includes automated exposure control, adjustment of the mA and/or kV according to patient size and/or use of iterative reconstruction technique. COMPARISON:  CT abdomen and pelvis dated 09/17/2023 and multiple priors FINDINGS: Lower chest: Chronic right pleural effusion with adjacent medial right lower lobe rounded density, likely round atelectasis. Additional subsegmental atelectasis in the middle lobe and lingula. Irregular basilar right lower lobe nodule measuring 1.3 x 0.7 cm (5:23), increased in size from  5 x 4 mm on 07/22/2023. Unchanged right lower  lobe perifissural nodules. Partially imaged heart size is normal. Coronary artery calcifications. Hepatobiliary: Ill-defined 1.8 cm hypodensity in segment 4/8 (2:13), incompletely characterized, but appears new compared to more remote studies. No intra or extrahepatic biliary ductal dilation. Gallbladder is contracted. Pancreas: No focal lesions or main ductal dilation. Spleen: Normal in size without focal abnormality. Adrenals/Urinary Tract: No adrenal nodules. Persistent bilateral hydroureteronephrosis. Asymmetric right renal atrophy. Multifocal bilateral cysts are again seen including peripherally calcified right lower pole cysts. No renal calculi. The ureters are not well seen in the absence of contrast. Prior cystectomy. Right lower quadrant urostomy. Stomach/Bowel: Normal appearance of the stomach. No evidence of bowel wall thickening, distention, or inflammatory changes. Moderate volume stool throughout the colon. Appendix is not discretely seen. Vascular/Lymphatic: Aortic atherosclerosis. No enlarged abdominal or pelvic lymph nodes. Reproductive: Prostate brachytherapy seeds. Other: No free fluid, fluid collection, or free air. Musculoskeletal: Similar to slightly increased lytic changes of the left sacral ala. New lytic changes involving the left iliac bone adjacent to the sacroiliac joint. Degenerative changes of the hips, left-greater-than-right. Multilevel degenerative changes of the partially imaged thoracic and lumbar spine. IMPRESSION: 1. Ill-defined 1.8 cm hypodensity in segment 4/8 of the liver, incompletely characterized, but appears new compared to more remote studies. Findings are suspicious for metastatic disease. Recommend further evaluation with contrast-enhanced MRI of the abdomen. Alternatively, attention on follow-up. 2. Similar to slightly increased lytic changes of the left sacral ala. New lytic changes involving the left iliac bone adjacent  to the sacroiliac joint. 3. Irregular basilar right lower lobe nodule measuring 1.3 x 0.7 cm, increased in size from 5 x 4 mm on 07/22/2023, suspicious for metastatic disease. 4. Postsurgical change from cystectomy with right lower quadrant ostomy. Persistent bilateral hydroureteronephrosis. Ureters are not well evaluated in the absence of contrast. 5. Aortic Atherosclerosis (ICD10-I70.0). Coronary artery calcifications. Assessment for potential risk factor modification, dietary therapy or pharmacologic therapy may be warranted, if clinically indicated. Electronically Signed   By: Agustin Cree M.D.   On: 09/30/2023 20:28   DG Chest Port 1 View Result Date: 09/30/2023 CLINICAL DATA:  Fever.  Bladder cancer removed in August. EXAM: PORTABLE CHEST 1 VIEW COMPARISON:  09/02/2023 FINDINGS: The heart size and mediastinal contours are within normal limits. Both lungs are clear. The visualized skeletal structures are unremarkable. IMPRESSION: No active disease. Electronically Signed   By: Burman Nieves M.D.   On: 09/30/2023 20:11    Pertinent labs & imaging results that were available during my care of the patient were reviewed by me and considered in my medical decision making (see MDM for details).  Medications Ordered in ED Medications  lactated ringers infusion ( Intravenous New Bag/Given 09/30/23 2138)  fluticasone furoate-vilanterol (BREO ELLIPTA) 100-25 MCG/ACT 1 puff (has no administration in time range)  levETIRAcetam (KEPPRA) tablet 500 mg (has no administration in time range)  OXcarbazepine ER TB24 600 mg (has no administration in time range)  primidone (MYSOLINE) tablet 100 mg (has no administration in time range)  doxepin (SINEQUAN) capsule 25 mg (has no administration in time range)  finasteride (PROSCAR) tablet 5 mg (has no administration in time range)  linaclotide (LINZESS) capsule 145 mcg (has no administration in time range)  enoxaparin (LOVENOX) injection 30 mg (has no administration  in time range)  acetaminophen (TYLENOL) tablet 650 mg (has no administration in time range)    Or  acetaminophen (TYLENOL) suppository 650 mg (has no administration in time range)  polyethylene glycol (MIRALAX / GLYCOLAX) packet 17 g (has  no administration in time range)  cefTRIAXone (ROCEPHIN) 1 g in sodium chloride 0.9 % 100 mL IVPB (has no administration in time range)  sodium chloride 0.9 % bolus 1,428 mL (0 mLs Intravenous Stopped 09/30/23 2122)  cefTRIAXone (ROCEPHIN) 1 g in sodium chloride 0.9 % 100 mL IVPB (0 g Intravenous Stopped 09/30/23 2136)                                                                                                                                     Procedures .Critical Care  Performed by: Lonell Grandchild, MD Authorized by: Lonell Grandchild, MD   Critical care provider statement:    Critical care time (minutes):  30   Critical care was time spent personally by me on the following activities:  Development of treatment plan with patient or surrogate, discussions with consultants, evaluation of patient's response to treatment, examination of patient, ordering and review of laboratory studies, ordering and review of radiographic studies, ordering and performing treatments and interventions, pulse oximetry, re-evaluation of patient's condition and review of old charts   Care discussed with: admitting provider     (including critical care time)  Medical Decision Making / ED Course   MDM:  72 year old presenting to the emergency department with fever.  Patient is febrile in the emergency department.  Also has tachycardia.  Suspect likely urinary source given history.  He does also have some left lower quadrant tenderness on exam and area of focal swelling where his prior drain was placed so we will obtain CT scan to rule out intra-abdominal process such as abscess, fistula.  Lower concern for pneumonia, lungs clear on exam, will check chest x-ray.  Will  obtain blood cultures given fever.  Will check COVID although no real upper respiratory tract symptoms.  Will reassess.      Additional history obtained: -Additional history obtained from spouse -External records from outside source obtained and reviewed including: Chart review including previous notes, labs, imaging, consultation notes including prior labs and imaging    Lab Tests: -I ordered, reviewed, and interpreted labs.   The pertinent results include:   Labs Reviewed  COMPREHENSIVE METABOLIC PANEL - Abnormal; Notable for the following components:      Result Value   Sodium 132 (*)    CO2 20 (*)    Glucose, Bld 137 (*)    BUN 54 (*)    Creatinine, Ser 2.34 (*)    Albumin 2.8 (*)    Alkaline Phosphatase 144 (*)    GFR, Estimated 28 (*)    All other components within normal limits  CBC WITH DIFFERENTIAL/PLATELET - Abnormal; Notable for the following components:   WBC 15.7 (*)    RBC 3.71 (*)    Hemoglobin 11.4 (*)    HCT 34.8 (*)    Platelets 417 (*)    Neutro Abs 13.5 (*)    Monocytes Absolute 1.3 (*)  Abs Immature Granulocytes 0.12 (*)    All other components within normal limits  URINALYSIS, W/ REFLEX TO CULTURE (INFECTION SUSPECTED) - Abnormal; Notable for the following components:   APPearance CLOUDY (*)    Hgb urine dipstick SMALL (*)    Protein, ur 30 (*)    Leukocytes,Ua LARGE (*)    Bacteria, UA MANY (*)    All other components within normal limits  RESP PANEL BY RT-PCR (RSV, FLU A&B, COVID)  RVPGX2  CULTURE, BLOOD (ROUTINE X 2)  CULTURE, BLOOD (ROUTINE X 2)  URINE CULTURE  PROTIME-INR  CBC  CREATININE, SERUM  APTT  PROTIME-INR  BASIC METABOLIC PANEL  CBC  I-STAT CG4 LACTIC ACID, ED  I-STAT CG4 LACTIC ACID, ED  TROPONIN I (HIGH SENSITIVITY)    Notable for signs of UTI, mild AKI, leukocytosis   EKG   EKG Interpretation Date/Time:  Monday September 30 2023 19:19:19 EST Ventricular Rate:  103 PR Interval:  164 QRS Duration:  72 QT  Interval:  307 QTC Calculation: 402 R Axis:   -45  Text Interpretation: Sinus tachycardia Inferior infarct, old Lead(s) V2 were not used for morphology analysis Confirmed by Alvino Blood ([id number]) on 09/30/2023 7:26:09 PM         Imaging Studies ordered: I ordered imaging studies including CT abdomen  On my interpretation imaging demonstrates possible metastatic disease  I independently visualized and interpreted imaging. I agree with the radiologist interpretation   Medicines ordered and prescription drug management: Meds ordered this encounter  Medications   sodium chloride 0.9 % bolus 1,428 mL   cefTRIAXone (ROCEPHIN) 1 g in sodium chloride 0.9 % 100 mL IVPB    Antibiotic Indication::   UTI   lactated ringers infusion   fluticasone furoate-vilanterol (BREO ELLIPTA) 100-25 MCG/ACT 1 puff   levETIRAcetam (KEPPRA) tablet 500 mg   OXcarbazepine ER TB24 600 mg   primidone (MYSOLINE) tablet 100 mg   doxepin (SINEQUAN) capsule 25 mg   finasteride (PROSCAR) tablet 5 mg   linaclotide (LINZESS) capsule 145 mcg   enoxaparin (LOVENOX) injection 30 mg   OR Linked Order Group    acetaminophen (TYLENOL) tablet 650 mg    acetaminophen (TYLENOL) suppository 650 mg   polyethylene glycol (MIRALAX / GLYCOLAX) packet 17 g   cefTRIAXone (ROCEPHIN) 1 g in sodium chloride 0.9 % 100 mL IVPB    Antibiotic Indication::   UTI    -I have reviewed the patients home medicines and have made adjustments as needed   Consultations Obtained: I requested consultation with the hospitalist,  and discussed lab and imaging findings as well as pertinent plan - they recommend: admission  Reevaluation: After the interventions noted above, I reevaluated the patient and found that their symptoms have improved  Co morbidities that complicate the patient evaluation  Past Medical History:  Diagnosis Date   Arthritis    Atypical chest pain    cardiologist-- dr Leeann Must;  chronic occasional,  pressure /  discomfort, LOV 07/14/21 (as of 06/08/22)   Bladder calculus    BPH with obstruction/lower urinary tract symptoms    Follows with Dr. Kasandra Knudsen, urologist.   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 Daphane Shepherd at [facility] neurology, LOV w/  FNP on 03/21/22 in Epic ( as of 06/08/22)   Generalized abdominal pain    w/  bloating due to UC  take doxepin   GERD (gastroesophageal reflux disease)    H/O urinary retention    History of bladder stone    History of hiatal hernia    History of kidney stones    History of prostate cancer 2010   urologist--- dr pace;   dx 2010,  Gleason 4+3, PSA 5.2;   03-04-2009  s/p radiactive prostate seed implants and external beam radiation   History of repair of hiatal hernia    02-11-2017  s/p lap paraesophageal hernia repair w/ nissen fundoplication   History of skin cancer    Hx of resection of meningioma 02/11/2009   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 Rhetta Mura (GI);  dx 2010;  current treatment stelara q8wks, LOV 06/07/22 (as of 06/08/22)   Wears glasses       Dispostion: Disposition decision including need for hospitalization was considered, and patient admitted to the hospital.    Final Clinical Impression(s) / ED Diagnoses Final diagnoses:  Sepsis due to urinary tract infection (HCC)     This chart was dictated using voice  recognition software.  Despite best efforts to proofread,  errors can occur which can change the documentation meaning.    Lonell Grandchild, MD 09/30/23 2230

## 2023-09-30 NOTE — Assessment & Plan Note (Signed)
Previously documented in chart, patient has previously taken Ustekinumab for same.  Currently not in flare.  Patient's Biologics are currently on hold per patient

## 2023-09-30 NOTE — Progress Notes (Signed)
Elink monitoring for the code sepsis protocol.  

## 2023-09-30 NOTE — ED Notes (Signed)
Pt reports he took two tylenol extra strength prior  to arrival

## 2023-09-30 NOTE — ED Triage Notes (Signed)
Patient has bladder cancer/bladder removal in August. Stated his fever had been 103F today. Has a urostomy. Denies any other symptoms.

## 2023-10-01 DIAGNOSIS — N184 Chronic kidney disease, stage 4 (severe): Secondary | ICD-10-CM | POA: Diagnosis not present

## 2023-10-01 DIAGNOSIS — N390 Urinary tract infection, site not specified: Secondary | ICD-10-CM

## 2023-10-01 DIAGNOSIS — A419 Sepsis, unspecified organism: Secondary | ICD-10-CM

## 2023-10-01 DIAGNOSIS — C679 Malignant neoplasm of bladder, unspecified: Secondary | ICD-10-CM

## 2023-10-01 DIAGNOSIS — G40209 Localization-related (focal) (partial) symptomatic epilepsy and epileptic syndromes with complex partial seizures, not intractable, without status epilepticus: Secondary | ICD-10-CM

## 2023-10-01 DIAGNOSIS — I10 Essential (primary) hypertension: Secondary | ICD-10-CM

## 2023-10-01 LAB — CBC
HCT: 34.6 % — ABNORMAL LOW (ref 39.0–52.0)
Hemoglobin: 10.8 g/dL — ABNORMAL LOW (ref 13.0–17.0)
MCH: 30.6 pg (ref 26.0–34.0)
MCHC: 31.2 g/dL (ref 30.0–36.0)
MCV: 98 fL (ref 80.0–100.0)
Platelets: 331 10*3/uL (ref 150–400)
RBC: 3.53 MIL/uL — ABNORMAL LOW (ref 4.22–5.81)
RDW: 14.5 % (ref 11.5–15.5)
WBC: 15.2 10*3/uL — ABNORMAL HIGH (ref 4.0–10.5)
nRBC: 0 % (ref 0.0–0.2)

## 2023-10-01 LAB — BASIC METABOLIC PANEL
Anion gap: 12 (ref 5–15)
BUN: 47 mg/dL — ABNORMAL HIGH (ref 8–23)
CO2: 18 mmol/L — ABNORMAL LOW (ref 22–32)
Calcium: 8.7 mg/dL — ABNORMAL LOW (ref 8.9–10.3)
Chloride: 105 mmol/L (ref 98–111)
Creatinine, Ser: 1.6 mg/dL — ABNORMAL HIGH (ref 0.61–1.24)
GFR, Estimated: 45 mL/min — ABNORMAL LOW (ref >60)
Glucose, Bld: 108 mg/dL — ABNORMAL HIGH (ref 70–99)
Potassium: 3.4 mmol/L — ABNORMAL LOW (ref 3.5–5.1)
Sodium: 135 mmol/L (ref 135–145)

## 2023-10-01 LAB — APTT: aPTT: 50 s — ABNORMAL HIGH (ref 24–36)

## 2023-10-01 LAB — PROTIME-INR
INR: 1.2 (ref 0.8–1.2)
Prothrombin Time: 15.5 s — ABNORMAL HIGH (ref 11.4–15.2)

## 2023-10-01 MED ORDER — DIPHENHYDRAMINE HCL 25 MG PO CAPS
25.0000 mg | ORAL_CAPSULE | Freq: Every evening | ORAL | Status: DC | PRN
  Administered 2023-10-01 – 2023-10-02 (×2): 25 mg via ORAL
  Filled 2023-10-01 (×2): qty 1

## 2023-10-01 MED ORDER — POTASSIUM CHLORIDE CRYS ER 20 MEQ PO TBCR
40.0000 meq | EXTENDED_RELEASE_TABLET | Freq: Two times a day (BID) | ORAL | Status: AC
  Administered 2023-10-01 (×2): 40 meq via ORAL
  Filled 2023-10-01 (×2): qty 2

## 2023-10-01 MED ORDER — OXCARBAZEPINE 300 MG PO TABS
300.0000 mg | ORAL_TABLET | Freq: Once | ORAL | Status: AC
  Administered 2023-10-01: 300 mg via ORAL
  Filled 2023-10-01: qty 1

## 2023-10-01 NOTE — Plan of Care (Signed)
?  Problem: Coping: ?Goal: Level of anxiety will decrease ?Outcome: Progressing ?  ?Problem: Safety: ?Goal: Ability to remain free from injury will improve ?Outcome: Progressing ?  ?

## 2023-10-01 NOTE — Plan of Care (Signed)

## 2023-10-01 NOTE — Progress Notes (Signed)
   10/01/23 0544  Assess: MEWS Score  Temp 98.4 F (36.9 C)  BP 111/82  MAP (mmHg) 89  Pulse Rate (!) 135  Resp 20  SpO2 96 %  O2 Device Room Air  Assess: MEWS Score  MEWS Temp 0  MEWS Systolic 0  MEWS Pulse 3  MEWS RR 0  MEWS LOC 0  MEWS Score 3  MEWS Score Color Yellow  Assess: if the MEWS score is Yellow or Red  Were vital signs accurate and taken at a resting state? Yes  Does the patient meet 2 or more of the SIRS criteria? No  MEWS guidelines implemented  Yes, yellow  Treat  MEWS Interventions Considered administering scheduled or prn medications/treatments as ordered  Take Vital Signs  Increase Vital Sign Frequency  Yellow: Q2hr x1, continue Q4hrs until patient remains green for 12hrs  Escalate  MEWS: Escalate Yellow: Discuss with charge nurse and consider notifying provider and/or RRT  Provider Notification  Provider Name/Title Chinita Greenland, NP  Date Provider Notified 10/01/23  Time Provider Notified [phone]  Method of Notification Page (chat)  Notification Reason Other (Comment) (HR Tachy 135)  Provider response Other (Comment) ([address] [place])  Date of Provider Response 10/01/23  Time of Provider Response [phone]  Assess: SIRS CRITERIA  SIRS Temperature  0  SIRS Respirations  0  SIRS Pulse 1  SIRS WBC 0  SIRS Score Sum  1

## 2023-10-01 NOTE — ED Notes (Signed)
ED TO INPATIENT HANDOFF REPORT  ED Nurse Name and Phone #: Arfa [phone]  S Name/Age/Gender Wesley [name] 72 y.o. male Room/Bed: WA13/WA13  Code Status   Code Status: Full Code  Home/SNF/Other Home Patient oriented to: self, place, time, and situation Is this baseline? Yes   Triage Complete: Triage complete  Chief Complaint UTI (urinary tract infection) [N39.0]  Triage Note Patient has bladder cancer/bladder removal in August. Stated his fever had been 103F today. Has a urostomy. Denies any other symptoms.    Allergies Allergies  Allergen Reactions   Sulfa Antibiotics Rash    Childhood    Level of Care/Admitting Diagnosis ED Disposition     ED Disposition  Admit   Condition  --   Comment  Hospital Area: [facility] [[id number]]  Level of Care: Med-Surg [16]  May admit patient to [facility] or [facility] if equivalent level of care is available:: No  Covid Evaluation: Asymptomatic - no recent exposure (last 10 days) testing not required  Diagnosis: UTI (urinary tract infection) [409811]  Admitting Physician: Nolberto Hanlon [[id number]]  Attending Physician: Nolberto Hanlon [[id number]]  Certification:: I certify this patient will need inpatient services for at least 2 midnights  Expected Medical Readiness: 10/02/2023          B Medical/Surgery History Past Medical History:  Diagnosis Date   Arthritis    Atypical chest pain    cardiologist-- dr Leeann Must;  chronic occasional,  pressure / discomfort, LOV 07/14/21 (as of 06/08/22)   Bladder calculus    BPH with obstruction/lower urinary tract symptoms    Follows with Dr. Kasandra Knudsen, urologist.   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  Daphane Shepherd at [facility] neurology, LOV w/ FNP on 03/21/22 in Epic ( as of 06/08/22)   Generalized abdominal pain    w/  bloating due to UC  take doxepin   GERD (gastroesophageal reflux disease)    H/O urinary retention    History of bladder stone    History of hiatal hernia    History of kidney stones    History of prostate cancer 2010   urologist--- dr pace;   dx 2010,  Gleason 4+3, PSA 5.2;   03-04-2009  s/p radiactive prostate seed implants and external beam radiation   History of repair of hiatal hernia    02-11-2017  s/p lap paraesophageal hernia repair w/ nissen fundoplication   History of skin cancer    Hx of resection of meningioma 02/11/2009   benign right temperal lobe meningioma s/p resection  (residual hearing loss)   (neurosurgeon-- dr Tyrone Sage)   Hypertension    followed by cardiologist and pcp   Insomnia    Migraine with aura and without status migrainosus, not intractable    neurologist-- dr Marlena Clipper   Prostate cancer [facility])    Simple renal cyst    bilateral ;   followed by urologist   Ulcerative colitis [facility])    followed by dr v. Rhetta Mura (GI);  dx 2010;  current treatment stelara q8wks, LOV 06/07/22 (as of 06/08/22)   Wears glasses    Past Surgical History:  Procedure Laterality Date   BOTOX INJECTION N/A 01/29/2023   Procedure: BOTOX INJECTION  100 UNITS;  Surgeon: Noel Christmas, MD;  Location: [facility];  Service: Urology;  Laterality: N/A;   BRAIN MENINGIOMA EXCISION  02/11/2009   @[facility] ;  craniotomy , right temporal medial sphonoid wing resection   CARPAL TUNNEL RELEASE Bilateral    right 09-05-2016  @[facility] ;   left 2013   COLONOSCOPY  12/2019   COLONOSCOPY  01/2022   CYSTOSCOPY  03/05/2012   Procedure: CYSTOSCOPY;  Surgeon: Antony Haste, MD;  Location: [facility];  Service: Urology;  Laterality: N/A;  Cysto Clot Evacuation and Fulgeration Of [place] ([place])   CYSTOSCOPY WITH INJECTION N/A 05/22/2023   Procedure: CYSTOSCOPY WITH  INJECTION OF INDOCYANINE GREEN DYE;  Surgeon: Loletta Parish., MD;  [facility];  Service: Urology;  Laterality: N/A;   CYSTOSCOPY WITH LITHOLAPAXY N/A 03/13/2019   Procedure: CYSTOSCOPY WITH lASER LITHOLAPAXY, DILATION OF STRICTURE;  Surgeon: Ihor Gully, MD;  Location: [facility];  Service: Urology;  Laterality: N/A;   CYSTOSCOPY WITH LITHOLAPAXY N/A 01/02/2022   Procedure: CYSTOSCOPY WITH LITHOTRIPSY;  Surgeon: Noel Christmas, MD;  Location: [facility];  Service: Urology;  Laterality: N/A;   CYSTOSCOPY WITH RETROGRADE PYELOGRAM, URETEROSCOPY AND STENT PLACEMENT Right 03/13/2022   Procedure: CYSTOSCOPY WITH RETROGRADE PYELOGRAM, URETEROSCOPY AND STENT PLACEMENT;  Surgeon: Noel Christmas, MD;  Location: [facility];  Service: Urology;  Laterality: Right;  75 MINS   CYSTOSCOPY WITH RETROGRADE PYELOGRAM, URETEROSCOPY AND STENT PLACEMENT Right 08/07/2022   Procedure: CYSTOSCOPY WITH RETROGRADE PYELOGRAM, URETEROSCOPY AND STENT PLACEMENT, REMOVAL OF NEPHROURETERAL STENT;  Surgeon: Noel Christmas, MD;  [facility];  Service: Urology;  Laterality: Right;  75 MINS   CYSTOSCOPY WITH URETHRAL DILATATION N/A 08/23/2020   Procedure: CYSTOSCOPY WITH URETHRAL DILATATION, BLADDER BIOPSY, FULGERATION;  Surgeon: Noel Christmas, MD;  Location: [facility];  Service: Urology;  Laterality: N/A;  1 HR   CYSTOSCOPY WITH URETHRAL DILATATION N/A 03/13/2021   Procedure: CYSTOSCOPY WITH URETHRAL BALLOON DILATATION/ FOLEY CATHETER PLACEMENT;  Surgeon: Noel Christmas, MD;  [facility];  Service: Urology;  Laterality: N/A;   CYSTOSCOPY WITH URETHRAL DILATATION Left 07/08/2009   @[facility] :   with left  RTG   CYSTOSCOPY WITH URETHRAL DILATATION N/A 06/19/2022   Procedure: CYSTOSCOPY WITH OPTILUME URETHRAL DILATATION;  Surgeon: Noel Christmas, MD;  Location: [facility];  Service: Urology;  Laterality: N/A;    CYSTOSCOPY/URETEROSCOPY/HOLMIUM LASER/STENT PLACEMENT Bilateral 01/29/2023   Procedure: DIAGNOSTIC CYSTOSCOPY , LEFT RETROGRADE PYELOGRAM, LEFT URETERAL STENT PLACEMENT, AND STONE BASKETING;  Surgeon: Noel Christmas, MD;  Location: [facility];  Service: Urology;  Laterality: Bilateral;   HOLMIUM LASER APPLICATION N/A 08/23/2020   Procedure: HOLMIUM LASER APPLICATION OF URETHRAL STONE;  Surgeon: Noel Christmas, MD;  Location: [facility];  Service: Urology;  Laterality: N/A;   HOLMIUM LASER APPLICATION Right 03/13/2022   Procedure: HOLMIUM LASER APPLICATION;  Surgeon: Noel Christmas, MD;  Location: [facility];  Service: Urology;  Laterality: Right;   HOLMIUM LASER APPLICATION Right 08/07/2022   Procedure: HOLMIUM LASER APPLICATION;  Surgeon: Noel Christmas, MD;  [facility];  Service: Urology;  Laterality: Right;   INGUINAL HERNIA REPAIR Right 05/03/2015   @[facility]    INSERTION PROSTATE RADIATION SEED  03/04/2009   @[facility]    IR NEPHROSTOMY PLACEMENT RIGHT  07/18/2022   IR URETERAL STENT RIGHT NEW ACCESS W/O SEP NEPHROSTOMY CATH  02/04/2023   KNEE CARTILAGE SURGERY Left  1980S   LAPAROSCOPIC PARAESOPHAGEAL HERNIA REPAIR  02/11/2017   @[facility] ;  W/  NISSEN FUNDOPLICATION (MESH)   LYMPH NODE DISSECTION Bilateral 05/22/2023   Procedure: LYMPH NODE DISSECTION;  Surgeon: Loletta Parish., MD;  [facility];  Service: Urology;  Laterality: Bilateral;   NEPHROLITHOTOMY Right 03/29/2023   Procedure: NEPHROLITHOTOMY PERCUTANEOUS;  Surgeon: Loletta Parish., MD;  [facility];  Service: Urology;  Laterality: Right;  2 HRS   PROSTATE BIOPSY     ROBOT ASSISTED LAPAROSCOPIC COMPLETE CYSTECT ILEAL CONDUIT N/A 05/22/2023   Procedure: XI ROBOTIC ASSISTED LAPAROSCOPIC COMPLETE CYSTECT ILEAL CONDUIT;  Surgeon: Loletta Parish., MD;  [facility];  Service: Urology;  Laterality: N/A;   ROBOT ASSISTED LAPAROSCOPIC RADICAL PROSTATECTOMY  N/A 05/22/2023   Procedure: XI ROBOTIC ASSISTED LAPAROSCOPIC RADICAL PROSTATECTOMY;  Surgeon: Loletta Parish., MD;  [facility];  Service: Urology;  Laterality: N/A;   TOE SURGERY     TRANSURETHRAL RESECTION OF BLADDER TUMOR N/A 01/02/2022   Procedure: TRANSURETHRAL RESECTION OF BLADDER TUMOR (TURBT);  Surgeon: Noel Christmas, MD;  Location: [facility];  Service: Urology;  Laterality: N/A;   TRANSURETHRAL RESECTION OF BLADDER TUMOR N/A 01/29/2023   Procedure: TRANSURETHRAL RESECTION OF BLADDER TUMOR (TURBT);  Surgeon: Noel Christmas, MD;  Location: [facility];  Service: Urology;  Laterality: N/A;     A IV Location/Drains/Wounds Patient Lines/Drains/Airways Status     Active Line/Drains/Airways     Name Placement date Placement time Site Days   Peripheral IV 09/30/23 Left Forearm 09/30/23  1840  Forearm  1   Peripheral IV 09/30/23 20 G 1" Anterior;Distal;Right;Upper Arm 09/30/23  2147  Arm  1   Nephrostomy Right 10 Fr. 03/29/23  1748  Right  186   Urostomy Ileal conduit RUQ 05/22/23  1618  RUQ  132   Ureteral Drain/Stent Right ureter 7.2 Fr. 05/22/23  1544  Right ureter  132   Ureteral Drain/Stent Left ureter 7.2 Fr. 05/22/23  1604  Left ureter  132   Incision - 3 Ports Abdomen 1: Right;Lateral;Lower 2: Right;Medial;Lower 3: Left;Lateral;Lower 05/22/23  1600  -- 132            Intake/Output Last 24 hours  Intake/Output Summary (Last 24 hours) at 10/01/2023 0054 Last data filed at 09/30/2023 2136 Gross per 24 hour  Intake 1550 ml  Output --  Net 1550 ml    Labs/Imaging Results for orders placed or performed during the hospital encounter of 09/30/23 (from the past 48 hours)  Comprehensive metabolic panel     Status: Abnormal   Collection Time: 09/30/23  6:31 PM  Result Value Ref Range   Sodium 132 (L) 135 - 145 mmol/L   Potassium 4.0 3.5 - 5.1 mmol/L   Chloride 103 98 - 111 mmol/L   CO2 20 (L) 22 - 32 mmol/L   Glucose, Bld  137 (H) 70 - 99 mg/dL    Comment: Glucose reference range applies only to samples taken after fasting for at least 8 hours.   BUN 54 (H) 8 - 23 mg/dL   Creatinine, Ser 1.61 (H) 0.61 - 1.24 mg/dL   Calcium 9.0 8.9 - 09.6 mg/dL   Total Protein 7.2 6.5 - 8.1 g/dL   Albumin 2.8 (L) 3.5 - 5.0 g/dL   AST 32 15 - 41 U/L   ALT 26 0 - 44 U/L   Alkaline Phosphatase 144 (H) 38 - 126 U/L   Total Bilirubin  0.5 <1.2 mg/dL   GFR, Estimated 28 (L) >60 mL/min    Comment: (NOTE) Calculated using the CKD-EPI Creatinine Equation (2021)    Anion gap 9 5 - 15    Comment: Performed at [facility] [address] [place], [place] [address]  CBC with Differential     Status: Abnormal   Collection Time: 09/30/23  6:31 PM  Result Value Ref Range   WBC 15.7 (H) 4.0 - 10.5 K/uL   RBC 3.71 (L) 4.22 - 5.81 MIL/uL   Hemoglobin 11.4 (L) 13.0 - 17.0 g/dL   HCT 25.3 (L) 66.4 - 40.3 %   MCV 93.8 80.0 - 100.0 fL   MCH 30.7 26.0 - 34.0 pg   MCHC 32.8 30.0 - 36.0 g/dL   RDW 47.4 25.9 - 56.3 %   Platelets 417 (H) 150 - 400 K/uL   nRBC 0.0 0.0 - 0.2 %   Neutrophils Relative % 86 %   Neutro Abs 13.5 (H) 1.7 - 7.7 K/uL   Lymphocytes Relative 5 %   Lymphs Abs 0.8 0.7 - 4.0 K/uL   Monocytes Relative 8 %   Monocytes Absolute 1.3 (H) 0.1 - 1.0 K/uL   Eosinophils Relative 0 %   Eosinophils Absolute 0.0 0.0 - 0.5 K/uL   Basophils Relative 0 %   Basophils Absolute 0.1 0.0 - 0.1 K/uL   Immature Granulocytes 1 %   Abs Immature Granulocytes 0.12 (H) 0.00 - 0.07 K/uL    Comment: Performed at [facility], [address], [place], [place] [address]  Protime-INR     Status: None   Collection Time: 09/30/23  6:31 PM  Result Value Ref Range   Prothrombin Time 15.0 11.4 - 15.2 seconds   INR 1.2 0.8 - 1.2    Comment: (NOTE) INR goal varies based on device and disease states. Performed at [facility], [address], [place], [place] [address]   I-Stat Lactic Acid      Status: None   Collection Time: 09/30/23  6:45 PM  Result Value Ref Range   Lactic Acid, Venous 0.9 0.5 - 1.9 mmol/L  Resp panel by RT-PCR (RSV, Flu A&B, Covid) Anterior Nasal Swab     Status: None   Collection Time: 09/30/23  6:55 PM   Specimen: Anterior Nasal Swab  Result Value Ref Range   SARS Coronavirus 2 by RT PCR NEGATIVE NEGATIVE    Comment: (NOTE) SARS-CoV-2 target nucleic acids are NOT DETECTED.  The SARS-CoV-2 RNA is generally detectable in upper respiratory specimens during the acute phase of infection. The lowest concentration of SARS-CoV-2 viral copies this assay can detect is 138 copies/mL. A negative result does not preclude SARS-Cov-2 infection and should not be used as the sole basis for treatment or other patient management decisions. A negative result may occur with  improper specimen collection/handling, submission of specimen other than nasopharyngeal swab, presence of viral mutation(s) within the areas targeted by this assay, and inadequate number of viral copies(<138 copies/mL). A negative result must be combined with clinical observations, patient history, and epidemiological information. The expected result is Negative.  Fact Sheet for Patients:  BloggerCourse.com  Fact Sheet for Healthcare Providers:  SeriousBroker.it  This test is no t yet approved or cleared by the [place] FDA and  has been authorized for detection and/or diagnosis of SARS-CoV-2 by FDA under an Emergency Use Authorization (EUA). This EUA will remain  in effect (meaning this test can be used) for the  duration of the COVID-19 declaration under Section 564(b)(1) of the Act, 21 U.S.C.section 360bbb-3(b)(1), unless the authorization is terminated  or revoked sooner.       Influenza A by PCR NEGATIVE NEGATIVE   Influenza B by PCR NEGATIVE NEGATIVE    Comment: (NOTE) The Xpert Xpress SARS-CoV-2/FLU/RSV plus assay is intended as  an aid in the diagnosis of influenza from Nasopharyngeal swab specimens and should not be used as a sole basis for treatment. Nasal washings and aspirates are unacceptable for Xpert Xpress SARS-CoV-2/FLU/RSV testing.  Fact Sheet for Patients: BloggerCourse.com  Fact Sheet for Healthcare Providers: SeriousBroker.it  This test is not yet approved or cleared by the [place] FDA and has been authorized for detection and/or diagnosis of SARS-CoV-2 by FDA under an Emergency Use Authorization (EUA). This EUA will remain in effect (meaning this test can be used) for the duration of the COVID-19 declaration under Section 564(b)(1) of the Act, 21 U.S.C. section 360bbb-3(b)(1), unless the authorization is terminated or revoked.     Resp Syncytial Virus by PCR NEGATIVE NEGATIVE    Comment: (NOTE) Fact Sheet for Patients: BloggerCourse.com  Fact Sheet for Healthcare Providers: SeriousBroker.it  This test is not yet approved or cleared by the [place] FDA and has been authorized for detection and/or diagnosis of SARS-CoV-2 by FDA under an Emergency Use Authorization (EUA). This EUA will remain in effect (meaning this test can be used) for the duration of the COVID-19 declaration under Section 564(b)(1) of the Act, 21 U.S.C. section 360bbb-3(b)(1), unless the authorization is terminated or revoked.  Performed at [facility], [address] [place], [place] [address]   Urinalysis, w/ Reflex to Culture (Infection Suspected) -Urine, Clean Catch     Status: Abnormal   Collection Time: 09/30/23  7:25 PM  Result Value Ref Range   Specimen Source URINE, CLEAN CATCH    Color, Urine YELLOW YELLOW   APPearance CLOUDY (A) CLEAR   Specific Gravity, Urine 1.011 1.005 - 1.030   pH 6.0 5.0 - 8.0   Glucose, UA NEGATIVE NEGATIVE mg/dL   Hgb urine dipstick SMALL (A)  NEGATIVE   Bilirubin Urine NEGATIVE NEGATIVE   Ketones, ur NEGATIVE NEGATIVE mg/dL   Protein, ur 30 (A) NEGATIVE mg/dL   Nitrite NEGATIVE NEGATIVE   Leukocytes,Ua LARGE (A) NEGATIVE   RBC / HPF 0-5 0 - 5 RBC/hpf   WBC, UA 21-50 0 - 5 WBC/hpf    Comment:        Reflex urine culture not performed if WBC <=10, OR if Squamous epithelial cells >5. If Squamous epithelial cells >5 suggest recollection.    Bacteria, UA MANY (A) NONE SEEN   Squamous Epithelial / HPF 0-5 0 - 5 /HPF   Renal Epithelial 1    Mucus PRESENT     Comment: Performed at [facility], [address] [place], [place] [address]  I-Stat Lactic Acid     Status: None   Collection Time: 09/30/23  8:54 PM  Result Value Ref Range   Lactic Acid, Venous 0.6 0.5 - 1.9 mmol/L  Troponin I (High Sensitivity)     Status: Abnormal   Collection Time: 09/30/23 10:59 PM  Result Value Ref Range   Troponin I (High Sensitivity) 32 (H) <18 ng/L    Comment: (NOTE) Elevated high sensitivity troponin I (hsTnI) values and significant  changes across serial measurements may suggest ACS but many other  chronic and acute conditions are known to elevate hsTnI results.  Refer  to the "Links" section for chest pain algorithms and additional  guidance. Performed at [facility], [address] [place], [place] [address]   CBC     Status: Abnormal   Collection Time: 09/30/23 10:59 PM  Result Value Ref Range   WBC 16.6 (H) 4.0 - 10.5 K/uL   RBC 3.25 (L) 4.22 - 5.81 MIL/uL   Hemoglobin 10.1 (L) 13.0 - 17.0 g/dL   HCT 60.4 (L) 54.0 - 98.1 %   MCV 95.4 80.0 - 100.0 fL   MCH 31.1 26.0 - 34.0 pg   MCHC 32.6 30.0 - 36.0 g/dL   RDW 19.1 47.8 - 29.5 %   Platelets 352 150 - 400 K/uL   nRBC 0.0 0.0 - 0.2 %    Comment: Performed at [facility], [address], [place], [place] [address]  Creatinine, serum     Status: Abnormal   Collection Time: 09/30/23 10:59 PM  Result Value Ref Range    Creatinine, Ser 2.11 (H) 0.61 - 1.24 mg/dL   GFR, Estimated 32 (L) >60 mL/min    Comment: (NOTE) Calculated using the CKD-EPI Creatinine Equation (2021) Performed at [facility], [address], [place], [place] [address]    CT ABDOMEN PELVIS WO CONTRAST Result Date: 09/30/2023 CLINICAL DATA:  History of bladder cancer status post cystectomy with fever. * Tracking Code: BO * EXAM: CT ABDOMEN AND PELVIS WITHOUT CONTRAST TECHNIQUE: Multidetector CT imaging of the abdomen and pelvis was performed following the standard protocol without IV contrast. RADIATION DOSE REDUCTION: This exam was performed according to the departmental dose-optimization program which includes automated exposure control, adjustment of the mA and/or kV according to patient size and/or use of iterative reconstruction technique. COMPARISON:  CT abdomen and pelvis dated 09/17/2023 and multiple priors FINDINGS: Lower chest: Chronic right pleural effusion with adjacent medial right lower lobe rounded density, likely round atelectasis. Additional subsegmental atelectasis in the middle lobe and lingula. Irregular basilar right lower lobe nodule measuring 1.3 x 0.7 cm (5:23), increased in size from 5 x 4 mm on 07/22/2023. Unchanged right lower lobe perifissural nodules. Partially imaged heart size is normal. Coronary artery calcifications. Hepatobiliary: Ill-defined 1.8 cm hypodensity in segment 4/8 (2:13), incompletely characterized, but appears new compared to more remote studies. No intra or extrahepatic biliary ductal dilation. Gallbladder is contracted. Pancreas: No focal lesions or main ductal dilation. Spleen: Normal in size without focal abnormality. Adrenals/Urinary Tract: No adrenal nodules. Persistent bilateral hydroureteronephrosis. Asymmetric right renal atrophy. Multifocal bilateral cysts are again seen including peripherally calcified right lower pole cysts. No renal calculi. The ureters are not well seen in  the absence of contrast. Prior cystectomy. Right lower quadrant urostomy. Stomach/Bowel: Normal appearance of the stomach. No evidence of bowel wall thickening, distention, or inflammatory changes. Moderate volume stool throughout the colon. Appendix is not discretely seen. Vascular/Lymphatic: Aortic atherosclerosis. No enlarged abdominal or pelvic lymph nodes. Reproductive: Prostate brachytherapy seeds. Other: No free fluid, fluid collection, or free air. Musculoskeletal: Similar to slightly increased lytic changes of the left sacral ala. New lytic changes involving the left iliac bone adjacent to the sacroiliac joint. Degenerative changes of the hips, left-greater-than-right. Multilevel degenerative changes of the partially imaged thoracic and lumbar spine. IMPRESSION: 1. Ill-defined 1.8 cm hypodensity in segment 4/8 of the liver, incompletely characterized, but appears new compared to more remote studies. Findings are suspicious for metastatic disease. Recommend further evaluation with contrast-enhanced MRI of the abdomen. Alternatively, attention on follow-up. 2. Similar to  slightly increased lytic changes of the left sacral ala. New lytic changes involving the left iliac bone adjacent to the sacroiliac joint. 3. Irregular basilar right lower lobe nodule measuring 1.3 x 0.7 cm, increased in size from 5 x 4 mm on 07/22/2023, suspicious for metastatic disease. 4. Postsurgical change from cystectomy with right lower quadrant ostomy. Persistent bilateral hydroureteronephrosis. Ureters are not well evaluated in the absence of contrast. 5. Aortic Atherosclerosis (ICD10-I70.0). Coronary artery calcifications. Assessment for potential risk factor modification, dietary therapy or pharmacologic therapy may be warranted, if clinically indicated. Electronically Signed   By: Agustin Cree M.D.   On: 09/30/2023 20:28   DG Chest Port 1 View Result Date: 09/30/2023 CLINICAL DATA:  Fever.  Bladder cancer removed in August. EXAM:  PORTABLE CHEST 1 VIEW COMPARISON:  09/02/2023 FINDINGS: The heart size and mediastinal contours are within normal limits. Both lungs are clear. The visualized skeletal structures are unremarkable. IMPRESSION: No active disease. Electronically Signed   By: Burman Nieves M.D.   On: 09/30/2023 20:11    Pending Labs Unresulted Labs (From admission, onward)     Start     Ordered   10/07/23 0500  Creatinine, serum  (enoxaparin (LOVENOX)    CrCl >/= 30 ml/min)  Weekly,   R     Comments: while on enoxaparin therapy    09/30/23 2201   10/01/23 0500  APTT  Tomorrow morning,   R        09/30/23 2201   10/01/23 0500  Protime-INR  Tomorrow morning,   R        09/30/23 2201   10/01/23 0500  Basic metabolic panel  Tomorrow morning,   R        09/30/23 2201   10/01/23 0500  CBC  Tomorrow morning,   R        09/30/23 2201   09/30/23 1925  Urine Culture  Once,   R        09/30/23 1925   09/30/23 1832  Culture, blood (routine x 2)  BLOOD CULTURE X 2,   R      09/30/23 1832            Vitals/Pain Today's Vitals   09/30/23 1930 09/30/23 1951 09/30/23 2045 09/30/23 2300  BP: (!) 140/74  (!) 146/87 (!) 147/86  Pulse: (!) 104  (!) 104 96  Resp: 13  17 13   Temp:    98.4 F (36.9 C)  TempSrc:    Oral  SpO2: 98%  97% 100%  Weight:      Height:      PainSc:  0-No pain  0-No pain    Isolation Precautions No active isolations  Medications Medications  lactated ringers infusion ( Intravenous New Bag/Given 09/30/23 2138)  fluticasone furoate-vilanterol (BREO ELLIPTA) 100-25 MCG/ACT 1 puff (has no administration in time range)  levETIRAcetam (KEPPRA) tablet 500 mg (500 mg Oral Given 09/30/23 2252)  OXcarbazepine ER TB24 600 mg (600 mg Oral Not Given 09/30/23 2254)  primidone (MYSOLINE) tablet 100 mg (100 mg Oral Given 09/30/23 2252)  doxepin (SINEQUAN) capsule 25 mg (25 mg Oral Given 09/30/23 2252)  finasteride (PROSCAR) tablet 5 mg (has no administration in time range)  linaclotide  (LINZESS) capsule 145 mcg (has no administration in time range)  enoxaparin (LOVENOX) injection 30 mg (has no administration in time range)  acetaminophen (TYLENOL) tablet 650 mg (has no administration in time range)    Or  acetaminophen (TYLENOL) suppository 650 mg (has no administration  in time range)  polyethylene glycol (MIRALAX / GLYCOLAX) packet 17 g (has no administration in time range)  cefTRIAXone (ROCEPHIN) 1 g in sodium chloride 0.9 % 100 mL IVPB (has no administration in time range)  sodium chloride 0.9 % bolus 1,428 mL (0 mLs Intravenous Stopped 09/30/23 2122)  cefTRIAXone (ROCEPHIN) 1 g in sodium chloride 0.9 % 100 mL IVPB (0 g Intravenous Stopped 09/30/23 2136)    Mobility walks        R  Report given to:

## 2023-10-01 NOTE — Hospital Course (Addendum)
Wesley [name] is a 72 y.o. male with past medical history of bladder cancer status post cystectomy and ileal conduit in August 2024 presented to hospital with chills and fever.  Recently patient had a  Klebsiella UTI back in October of this year that was ampicillin resistant.  He also developed pleural effusion being followed by pulmonary as outpatient with weight loss.  Has a left lower abdominal subcutaneous swelling which has become painful as well.  In the ED patient was noted to be febrile with a temperature of 101 F and was mildly tachypneic with tachycardia.  Labs showed leukocytosis at 15.7, hemoglobin 11.4.  Lactate was 0.9.  COVID influenza and RSV was negative.  Chemistry showed sodium of 132 with creatinine elevated at 2.3.  Urinalysis was positive for many bacteria and white cells in urine.  Blood cultures were sent from the ED and patient was admitted hospital for further evaluation and treatment.  Assessment and plan.  Sepsis  secondary to UTI.  Patient had leukocytosis, fever, tachycardia, tachypnea on presentation with abnormal urinalysis suggestive of sepsis.  Check blood cultures urine cultures.  Empirically on IV Rocephin.  Temperature max of 101 F.  Continue IV fluids.  History of bladder cancer status post robotic cystoprostatectomy cystectomy and ileal conduit.  Has persistent bilateral hydronephrosis.  Had seen urologist October 2024 and decision was to monitor clinically.  Creatinine appears to be at baseline Dr. Berneice Heinrich urology was notified on admission...  History of Klebsiella UTI resistant to ampicillin in the past.  History of ulcerative colitis.previously taken Ustekinumab for same.  Not currently on any Biologics  Complex partial seizures on Keppra, oxcarbazepine, primidone  Lower abdominal nodule.  Likely metastatic.  Abdominal mass biopsy has been ordered.  History of partially liquidated pleural effusion being followed by pulmonary as outpatient.  CAT scan with  metastatic appearing nodules as well.  Mild hypokalemia.  Will replace.  Check BMP in AM.

## 2023-10-01 NOTE — Progress Notes (Signed)
Pts urostomy bag was leaking. Changed bag and cleansed stoma, stoma pink and without signs of infection. New bag secure in place and hooked to a standard drainage bag.

## 2023-10-01 NOTE — Progress Notes (Addendum)
PROGRESS NOTE    Wesley [name]  YNW:[id number] DOB: 10-18-1950 DOA: 09/30/2023 PCP: [name], MD    Brief Narrative:   Wesley [name] is a 72 y.o. male with past medical history of bladder cancer status post cystectomy and ileal conduit in August 2024 presented to hospital with chills and fever.  Recently patient had a  Klebsiella UTI back in October of this year that was ampicillin resistant.  He also developed pleural effusion being followed by pulmonary as outpatient with weight loss.  Has a left lower abdominal subcutaneous swelling which has become painful as well.  In the ED patient was noted to be febrile with a temperature of 101 F and was mildly tachypneic with tachycardia.  Labs showed leukocytosis at 15.7, hemoglobin 11.4.  Lactate was 0.9.  COVID influenza and RSV was negative.  Chemistry showed sodium of 132 with creatinine elevated at 2.3.  Urinalysis was positive for many bacteria and white cells in urine.  Blood cultures were sent from the ED and patient was admitted hospital for further evaluation and treatment.  Assessment and plan.  Sepsis  secondary to UTI.  Patient had leukocytosis, fever, tachycardia, tachypnea on presentation with abnormal urinalysis suggestive of sepsis.  Check blood cultures urine cultures.  Empirically on IV Rocephin.  Temperature max of 101 F.  Continue IV fluids.  WBC at 15.2.  Will continue to monitor closely.  History of bladder cancer status post robotic cystoprostatectomy cystectomy and ileal conduit.  Has persistent bilateral hydronephrosis.  Had seen urologist October 2024 and decision was to monitor clinically.  Creatinine appears to be at baseline Dr. Berneice Heinrich urology was notified on admission..  Patient has been followed by Dr. Urban Gibson as outpatient.  History of Klebsiella UTI resistant to ampicillin in the past.  History of ulcerative colitis.previously taken Ustekinumab for same.  Not currently on any Biologics  Mild hypokalemia.   Will replace orally.  Check levels in AM.  Complex partial seizures on Keppra, oxcarbazepine, primidone  Lower abdominal nodule.  Likely metastatic  irregular lump noted.  I do not think there is any additional benefit by doing a biopsy at this time.  History of partially loculated pleural effusion being followed by pulmonary as outpatient.  CAT scan with metastatic appearing nodules as well.  Had seen Dr. Arbutus Ped in the past.  Mild hypokalemia.  Will replace.  Check BMP in AM.      DVT prophylaxis: enoxaparin (LOVENOX) injection 30 mg Start: 10/01/23 0800 SCDs Start: 09/30/23 2200   Code Status:     Code Status: Full Code  Disposition: Home likely in 1 to 2 days  Status is: Inpatient Remains inpatient appropriate because: Sepsis, IV antibiotic, pending clinical improvement   Family Communication: None at bedside  Consultants:  None, Dr. Berneice Heinrich team notified.  Procedures:  None  Antimicrobials:  Rocephin IV  Anti-infectives (From admission, onward)    Start     Dose/Rate Route Frequency Ordered Stop   10/01/23 2000  cefTRIAXone (ROCEPHIN) 1 g in sodium chloride 0.9 % 100 mL IVPB        1 g 200 mL/hr over 30 Minutes Intravenous Every 24 hours 09/30/23 2225     09/30/23 2030  cefTRIAXone (ROCEPHIN) 1 g in sodium chloride 0.9 % 100 mL IVPB        1 g 200 mL/hr over 30 Minutes Intravenous  Once 09/30/23 2021 09/30/23 2136        Subjective: Today, patient was seen and examined at  bedside.  Patient states that he feels okay.  Denies any nausea vomiting and wishes to advance diet.  Denies any shortness of breath cough congestion.  Objective: Vitals:   10/01/23 0200 10/01/23 0544 10/01/23 0735 10/01/23 0845  BP:  111/82  90/71  Pulse:  (!) 135  (!) 135  Resp:  20  17  Temp:  98.4 F (36.9 C)  (!) 100.5 F (38.1 C)  TempSrc:  Oral  Oral  SpO2:  96% 94% 98%  Weight: 49 kg     Height: 5\' 4"  (1.626 m)       Intake/Output Summary (Last 24 hours) at 10/01/2023  1000 Last data filed at 10/01/2023 0300 Gross per 24 hour  Intake 1641.43 ml  Output --  Net 1641.43 ml   Filed Weights   09/30/23 1806 10/01/23 0200  Weight: 47.6 kg 49 kg    Physical Examination: Body mass index is 18.54 kg/m.  General: Thinly built, not in obvious distress HENT:   No scleral pallor or icterus noted. Oral mucosa is slightly dry. Chest:  .  Diminished breath sounds bilaterally. No crackles or wheezes.  CVS: S1 &S2 heard. No murmur.  Regular rate and rhythm. Abdomen: Soft, nontender, nondistended.  Bowel sounds are heard.  Urostomy bag in place, left lower abdomen with are irregular abdominal wall nodule. Extremities: No cyanosis, clubbing or edema.  Peripheral pulses are palpable. Psych: Alert, awake and oriented, normal mood CNS:  No cranial nerve deficits.  Moves all extremities. Skin: Warm and dry.  No rashes noted.  Data Reviewed:   CBC: Recent Labs  Lab 09/30/23 1831 09/30/23 2259 10/01/23 0600  WBC 15.7* 16.6* 15.2*  NEUTROABS 13.5*  --   --   HGB 11.4* 10.1* 10.8*  HCT 34.8* 31.0* 34.6*  MCV 93.8 95.4 98.0  PLT 417* 352 331    Basic Metabolic Panel: Recent Labs  Lab 09/30/23 1831 09/30/23 2259 10/01/23 0600  NA 132*  --  135  K 4.0  --  3.4*  CL 103  --  105  CO2 20*  --  18*  GLUCOSE 137*  --  108*  BUN 54*  --  47*  CREATININE 2.34* 2.11* 1.60*  CALCIUM 9.0  --  8.7*    Liver Function Tests: Recent Labs  Lab 09/30/23 1831  AST 32  ALT 26  ALKPHOS 144*  BILITOT 0.5  PROT 7.2  ALBUMIN 2.8*     Radiology Studies: CT ABDOMEN PELVIS WO CONTRAST Result Date: 09/30/2023 CLINICAL DATA:  History of bladder cancer status post cystectomy with fever. * Tracking Code: BO * EXAM: CT ABDOMEN AND PELVIS WITHOUT CONTRAST TECHNIQUE: Multidetector CT imaging of the abdomen and pelvis was performed following the standard protocol without IV contrast. RADIATION DOSE REDUCTION: This exam was performed according to the departmental  dose-optimization program which includes automated exposure control, adjustment of the mA and/or kV according to patient size and/or use of iterative reconstruction technique. COMPARISON:  CT abdomen and pelvis dated 09/17/2023 and multiple priors FINDINGS: Lower chest: Chronic right pleural effusion with adjacent medial right lower lobe rounded density, likely round atelectasis. Additional subsegmental atelectasis in the middle lobe and lingula. Irregular basilar right lower lobe nodule measuring 1.3 x 0.7 cm (5:23), increased in size from 5 x 4 mm on 07/22/2023. Unchanged right lower lobe perifissural nodules. Partially imaged heart size is normal. Coronary artery calcifications. Hepatobiliary: Ill-defined 1.8 cm hypodensity in segment 4/8 (2:13), incompletely characterized, but appears new compared to more remote studies.  No intra or extrahepatic biliary ductal dilation. Gallbladder is contracted. Pancreas: No focal lesions or main ductal dilation. Spleen: Normal in size without focal abnormality. Adrenals/Urinary Tract: No adrenal nodules. Persistent bilateral hydroureteronephrosis. Asymmetric right renal atrophy. Multifocal bilateral cysts are again seen including peripherally calcified right lower pole cysts. No renal calculi. The ureters are not well seen in the absence of contrast. Prior cystectomy. Right lower quadrant urostomy. Stomach/Bowel: Normal appearance of the stomach. No evidence of bowel wall thickening, distention, or inflammatory changes. Moderate volume stool throughout the colon. Appendix is not discretely seen. Vascular/Lymphatic: Aortic atherosclerosis. No enlarged abdominal or pelvic lymph nodes. Reproductive: Prostate brachytherapy seeds. Other: No free fluid, fluid collection, or free air. Musculoskeletal: Similar to slightly increased lytic changes of the left sacral ala. New lytic changes involving the left iliac bone adjacent to the sacroiliac joint. Degenerative changes of the hips,  left-greater-than-right. Multilevel degenerative changes of the partially imaged thoracic and lumbar spine. IMPRESSION: 1. Ill-defined 1.8 cm hypodensity in segment 4/8 of the liver, incompletely characterized, but appears new compared to more remote studies. Findings are suspicious for metastatic disease. Recommend further evaluation with contrast-enhanced MRI of the abdomen. Alternatively, attention on follow-up. 2. Similar to slightly increased lytic changes of the left sacral ala. New lytic changes involving the left iliac bone adjacent to the sacroiliac joint. 3. Irregular basilar right lower lobe nodule measuring 1.3 x 0.7 cm, increased in size from 5 x 4 mm on 07/22/2023, suspicious for metastatic disease. 4. Postsurgical change from cystectomy with right lower quadrant ostomy. Persistent bilateral hydroureteronephrosis. Ureters are not well evaluated in the absence of contrast. 5. Aortic Atherosclerosis (ICD10-I70.0). Coronary artery calcifications. Assessment for potential risk factor modification, dietary therapy or pharmacologic therapy may be warranted, if clinically indicated. Electronically Signed   By: Agustin Cree M.D.   On: 09/30/2023 20:28   DG Chest Port 1 View Result Date: 09/30/2023 CLINICAL DATA:  Fever.  Bladder cancer removed in August. EXAM: PORTABLE CHEST 1 VIEW COMPARISON:  09/02/2023 FINDINGS: The heart size and mediastinal contours are within normal limits. Both lungs are clear. The visualized skeletal structures are unremarkable. IMPRESSION: No active disease. Electronically Signed   By: Burman Nieves M.D.   On: 09/30/2023 20:11      LOS: 1 day     Joycelyn Das, MD [facility] Available via Epic secure chat 7am-7pm After these hours, please refer to coverage provider listed on [email or web address] 10/01/2023, 10:00 AM

## 2023-10-02 DIAGNOSIS — N184 Chronic kidney disease, stage 4 (severe): Secondary | ICD-10-CM | POA: Diagnosis not present

## 2023-10-02 DIAGNOSIS — C679 Malignant neoplasm of bladder, unspecified: Secondary | ICD-10-CM | POA: Diagnosis not present

## 2023-10-02 DIAGNOSIS — G40209 Localization-related (focal) (partial) symptomatic epilepsy and epileptic syndromes with complex partial seizures, not intractable, without status epilepticus: Secondary | ICD-10-CM | POA: Diagnosis not present

## 2023-10-02 DIAGNOSIS — A419 Sepsis, unspecified organism: Secondary | ICD-10-CM | POA: Diagnosis not present

## 2023-10-02 LAB — BASIC METABOLIC PANEL
Anion gap: 8 (ref 5–15)
BUN: 43 mg/dL — ABNORMAL HIGH (ref 8–23)
CO2: 19 mmol/L — ABNORMAL LOW (ref 22–32)
Calcium: 9.2 mg/dL (ref 8.9–10.3)
Chloride: 111 mmol/L (ref 98–111)
Creatinine, Ser: 1.71 mg/dL — ABNORMAL HIGH (ref 0.61–1.24)
GFR, Estimated: 41 mL/min — ABNORMAL LOW (ref >60)
Glucose, Bld: 108 mg/dL — ABNORMAL HIGH (ref 70–99)
Potassium: 4.3 mmol/L (ref 3.5–5.1)
Sodium: 138 mmol/L (ref 135–145)

## 2023-10-02 LAB — CBC
HCT: 29.9 % — ABNORMAL LOW (ref 39.0–52.0)
Hemoglobin: 9.7 g/dL — ABNORMAL LOW (ref 13.0–17.0)
MCH: 30.8 pg (ref 26.0–34.0)
MCHC: 32.4 g/dL (ref 30.0–36.0)
MCV: 94.9 fL (ref 80.0–100.0)
Platelets: 339 10*3/uL (ref 150–400)
RBC: 3.15 MIL/uL — ABNORMAL LOW (ref 4.22–5.81)
RDW: 14.5 % (ref 11.5–15.5)
WBC: 9.6 10*3/uL (ref 4.0–10.5)
nRBC: 0 % (ref 0.0–0.2)

## 2023-10-02 LAB — MAGNESIUM: Magnesium: 2.2 mg/dL (ref 1.7–2.4)

## 2023-10-02 NOTE — Progress Notes (Signed)
PROGRESS NOTE    [name]  ZOX:[id number] DOB: [birth year] DOA: 09/30/2023 PCP: [name], MD    Brief Narrative:   Wesley [name] is a 72 y.o. male with past medical history of bladder cancer status post cystectomy and ileal conduit in August 2024 presented to hospital with chills and fever.  Recently patient had a  Klebsiella UTI back in October of this year that was ampicillin resistant.  He also developed pleural effusion being followed by pulmonary as outpatient with weight loss.  Has a left lower abdominal subcutaneous swelling which has become painful as well.  In the ED patient was noted to be febrile with a temperature of 101 F and was mildly tachypneic with tachycardia.  Labs showed leukocytosis at 15.7, hemoglobin 11.4.  Lactate was 0.9.  COVID influenza and RSV was negative.  Chemistry showed sodium of 132 with creatinine elevated at 2.3.  Urinalysis was positive for many bacteria and white cells in urine.  Blood cultures were sent from the ED and patient was admitted hospital for further evaluation and treatment.  Assessment and plan.  Sepsis  secondary to gram-negative rod UTI.  Blood cultures negative in 2 days.  Empirically on IV Rocephin.  Temperature max of 101.2 F.  Continue IV fluids.  WBC at 9.6.  Improved WBC.  Will continue to monitor closely.  History of bladder cancer status post robotic cystoprostatectomy cystectomy and ileal conduit.   Has persistent bilateral hydronephrosis.  Had seen urologist October 2024 and decision was to monitor clinically.  Creatinine appears to be at baseline Dr. Berneice Heinrich urology was notified on admission..  Patient has been followed by Dr. Berneice Heinrich as outpatient.  History of Klebsiella UTI resistant to ampicillin in the past.  History of ulcerative colitis.previously taken Ustekinumab for same.  Not currently on any Biologics  Mild hypokalemia.  Improved after replacement.  Potassium today at 4.3.  Complex partial seizures on  Keppra, oxcarbazepine, primidone  Lower abdominal nodule.  Likely metastatic  irregular lump noted.  I do not think there is any additional benefit by doing a biopsy at this time.  Patient was supposed to get scan of the abdomen as outpatient but already had CT scan abdomen and pelvis here without contrast.  Patient does have elevated creatinine level limiting contrast to use at this time.  History of partially loculated pleural effusion being followed by pulmonary as outpatient.  CT scan with metastatic appearing nodules as well.  Had seen Dr. Arbutus Ped in the past.  Currently pulmonary wise is stable.     DVT prophylaxis: enoxaparin (LOVENOX) injection 30 mg Start: 10/01/23 0800 SCDs Start: 09/30/23 2200   Code Status:     Code Status: Full Code  Disposition: Home likely in 1 to 2 days  Status is: Inpatient  Remains inpatient appropriate because: Sepsis, IV antibiotic, pending clinical improvement   Family Communication: None at bedside  Consultants:  None, Dr. Berneice Heinrich team notified.  Procedures:  None  Antimicrobials:  Rocephin IV  Anti-infectives (From admission, onward)    Start     Dose/Rate Route Frequency Ordered Stop   10/01/23 2000  cefTRIAXone (ROCEPHIN) 1 g in sodium chloride 0.9 % 100 mL IVPB        1 g 200 mL/hr over 30 Minutes Intravenous Every 24 hours 09/30/23 2225     09/30/23 2030  cefTRIAXone (ROCEPHIN) 1 g in sodium chloride 0.9 % 100 mL IVPB        1 g 200 mL/hr over  30 Minutes Intravenous  Once 09/30/23 2021 09/30/23 2136       Subjective: Today, patient was seen and examined at bedside.  States that he could not sleep overnight.  Denies any nausea vomiting fever chills or rigor.  Patient is states that he does have a CT scan to be done tomorrow.    Objective: Vitals:   10/01/23 1827 10/01/23 2130 10/02/23 0253 10/02/23 0616  BP: 116/78 130/67 114/65 128/72  Pulse: 88 86 93 88  Resp: 18 20 20 20   Temp: 98.4 F (36.9 C) 97.8 F (36.6 C) 98.1 F  (36.7 C) 97.8 F (36.6 C)  TempSrc: Oral Oral Oral Oral  SpO2: 100% 100% 98% 99%  Weight:      Height:        Intake/Output Summary (Last 24 hours) at 10/02/2023 0929 Last data filed at 10/02/2023 0300 Gross per 24 hour  Intake 358.5 ml  Output 525 ml  Net -166.5 ml   Filed Weights   09/30/23 1806 10/01/23 0200  Weight: 47.6 kg 49 kg    Physical Examination: Body mass index is 18.54 kg/m.  General: Thinly built, not in obvious distress, on nasal cannula oxygen HENT:   No scleral pallor or icterus noted. Oral mucosa is slightly dry. Chest:  .  Diminished breath sounds bilaterally. No crackles or wheezes.  CVS: S1 &S2 heard. No murmur.  Regular rate and rhythm. Abdomen: Soft, nontender, nondistended.  Bowel sounds are heard.  Urostomy bag in place, left lower abdomen with hard, irregular abdominal wall nodule. Extremities: No cyanosis, clubbing or edema.  Peripheral pulses are palpable. Psych: Alert, awake and oriented, normal mood CNS:  No cranial nerve deficits.  Moves all extremities. Skin: Warm and dry.  No rashes noted.  Data Reviewed:   CBC: Recent Labs  Lab 09/30/23 1831 09/30/23 2259 10/01/23 0600 10/02/23 0547  WBC 15.7* 16.6* 15.2* 9.6  NEUTROABS 13.5*  --   --   --   HGB 11.4* 10.1* 10.8* 9.7*  HCT 34.8* 31.0* 34.6* 29.9*  MCV 93.8 95.4 98.0 94.9  PLT 417* 352 331 339    Basic Metabolic Panel: Recent Labs  Lab 09/30/23 1831 09/30/23 2259 10/01/23 0600 10/02/23 0547  NA 132*  --  135 138  K 4.0  --  3.4* 4.3  CL 103  --  105 111  CO2 20*  --  18* 19*  GLUCOSE 137*  --  108* 108*  BUN 54*  --  47* 43*  CREATININE 2.34* 2.11* 1.60* 1.71*  CALCIUM 9.0  --  8.7* 9.2  MG  --   --   --  2.2    Liver Function Tests: Recent Labs  Lab 09/30/23 1831  AST 32  ALT 26  ALKPHOS 144*  BILITOT 0.5  PROT 7.2  ALBUMIN 2.8*     Radiology Studies: CT ABDOMEN PELVIS WO CONTRAST Result Date: 09/30/2023 CLINICAL DATA:  History of bladder cancer  status post cystectomy with fever. * Tracking Code: BO * EXAM: CT ABDOMEN AND PELVIS WITHOUT CONTRAST TECHNIQUE: Multidetector CT imaging of the abdomen and pelvis was performed following the standard protocol without IV contrast. RADIATION DOSE REDUCTION: This exam was performed according to the departmental dose-optimization program which includes automated exposure control, adjustment of the mA and/or kV according to patient size and/or use of iterative reconstruction technique. COMPARISON:  CT abdomen and pelvis dated 09/17/2023 and multiple priors FINDINGS: Lower chest: Chronic right pleural effusion with adjacent medial right lower lobe rounded density,  likely round atelectasis. Additional subsegmental atelectasis in the middle lobe and lingula. Irregular basilar right lower lobe nodule measuring 1.3 x 0.7 cm (5:23), increased in size from 5 x 4 mm on 07/22/2023. Unchanged right lower lobe perifissural nodules. Partially imaged heart size is normal. Coronary artery calcifications. Hepatobiliary: Ill-defined 1.8 cm hypodensity in segment 4/8 (2:13), incompletely characterized, but appears new compared to more remote studies. No intra or extrahepatic biliary ductal dilation. Gallbladder is contracted. Pancreas: No focal lesions or main ductal dilation. Spleen: Normal in size without focal abnormality. Adrenals/Urinary Tract: No adrenal nodules. Persistent bilateral hydroureteronephrosis. Asymmetric right renal atrophy. Multifocal bilateral cysts are again seen including peripherally calcified right lower pole cysts. No renal calculi. The ureters are not well seen in the absence of contrast. Prior cystectomy. Right lower quadrant urostomy. Stomach/Bowel: Normal appearance of the stomach. No evidence of bowel wall thickening, distention, or inflammatory changes. Moderate volume stool throughout the colon. Appendix is not discretely seen. Vascular/Lymphatic: Aortic atherosclerosis. No enlarged abdominal or pelvic  lymph nodes. Reproductive: Prostate brachytherapy seeds. Other: No free fluid, fluid collection, or free air. Musculoskeletal: Similar to slightly increased lytic changes of the left sacral ala. New lytic changes involving the left iliac bone adjacent to the sacroiliac joint. Degenerative changes of the hips, left-greater-than-right. Multilevel degenerative changes of the partially imaged thoracic and lumbar spine. IMPRESSION: 1. Ill-defined 1.8 cm hypodensity in segment 4/8 of the liver, incompletely characterized, but appears new compared to more remote studies. Findings are suspicious for metastatic disease. Recommend further evaluation with contrast-enhanced MRI of the abdomen. Alternatively, attention on follow-up. 2. Similar to slightly increased lytic changes of the left sacral ala. New lytic changes involving the left iliac bone adjacent to the sacroiliac joint. 3. Irregular basilar right lower lobe nodule measuring 1.3 x 0.7 cm, increased in size from 5 x 4 mm on 07/22/2023, suspicious for metastatic disease. 4. Postsurgical change from cystectomy with right lower quadrant ostomy. Persistent bilateral hydroureteronephrosis. Ureters are not well evaluated in the absence of contrast. 5. Aortic Atherosclerosis (ICD10-I70.0). Coronary artery calcifications. Assessment for potential risk factor modification, dietary therapy or pharmacologic therapy may be warranted, if clinically indicated. Electronically Signed   By: Agustin Cree M.D.   On: 09/30/2023 20:28   DG Chest Port 1 View Result Date: 09/30/2023 CLINICAL DATA:  Fever.  Bladder cancer removed in August. EXAM: PORTABLE CHEST 1 VIEW COMPARISON:  09/02/2023 FINDINGS: The heart size and mediastinal contours are within normal limits. Both lungs are clear. The visualized skeletal structures are unremarkable. IMPRESSION: No active disease. Electronically Signed   By: Burman Nieves M.D.   On: 09/30/2023 20:11      LOS: 2 days     Joycelyn Das,  MD [facility] Available via Epic secure chat 7am-7pm After these hours, please refer to coverage provider listed on [email or web address] 10/02/2023, 9:29 AM

## 2023-10-02 NOTE — Plan of Care (Signed)
  Problem: Nutrition: Goal: Adequate nutrition will be maintained Outcome: Progressing   Problem: Coping: Goal: Level of anxiety will decrease Outcome: Progressing   Problem: Pain Management: Goal: General experience of comfort will improve Outcome: Progressing

## 2023-10-02 NOTE — Plan of Care (Signed)

## 2023-10-02 NOTE — Evaluation (Signed)
Physical Therapy Evaluation Patient Details Name: Wesley [name] MRN: [id number] DOB: 1950-11-24 Today's Date: 10/02/2023  History of Present Illness  Pt admitted from home with temp of 103 and dx sepsis 2* UTI.  Pt with hx of pleural effusion, tremor, hiatal hernia, meningioma resection with residual hearing loss, CKD, ulcerative collitis, comples partial seizures, and transurethral resection of bladder tumor.  Clinical Impression  Pt admitted as above and demonstrates MOD IND in bed mobility, transfers and IND ambulation in hall.  Pt states near baseline but feeling weaker with decreased endurance.  PT will sign off and refer pt to mobility team for remainder of stay unless pt condition deteriorates.        If plan is discharge home, recommend the following:     Can travel by private vehicle        Equipment Recommendations None recommended by PT  Recommendations for Other Services       Functional Status Assessment Patient has had a recent decline in their functional status and demonstrates the ability to make significant improvements in function in a reasonable and predictable amount of time.     Precautions / Restrictions Precautions Precautions: Fall Restrictions Weight Bearing Restrictions Per Provider Order: No      Mobility  Bed Mobility Overal bed mobility: Modified Independent             General bed mobility comments: no physical assist    Transfers Overall transfer level: Modified independent                 General transfer comment: EOB and BSC; no physical assist    Ambulation/Gait Ambulation/Gait assistance: Supervision, Independent Gait Distance (Feet): 130 Feet (130' twice and 15' twice to/from bathroom) Assistive device: None Gait Pattern/deviations: Step-through pattern       General Gait Details: mild general instability but no overt LOB; able to step bkwd and side-ways with min difficulty  Stairs            Wheelchair  Mobility     Tilt Bed    Modified Rankin (Stroke Patients Only)       Balance Overall balance assessment: Mild deficits observed, not formally tested                                           Pertinent Vitals/Pain Pain Assessment Pain Assessment: No/denies pain    Home Living Family/patient expects to be discharged to:: Private residence Living Arrangements: Spouse/significant other Available Help at Discharge: Family Type of Home: House Home Access: Stairs to enter   Secretary/administrator of Steps: 3   Home Layout: Able to live on main level with bedroom/bathroom Home Equipment: None      Prior Function Prior Level of Function : Independent/Modified Independent                     Extremity/Trunk Assessment   Upper Extremity Assessment Upper Extremity Assessment: Generalized weakness    Lower Extremity Assessment Lower Extremity Assessment: Generalized weakness    Cervical / Trunk Assessment Cervical / Trunk Assessment: Normal  Communication   Communication Communication: No apparent difficulties  Cognition Arousal: Alert Behavior During Therapy: WFL for tasks assessed/performed Overall Cognitive Status: Within Functional Limits for tasks assessed  General Comments      Exercises     Assessment/Plan    PT Assessment Patient needs continued PT services  PT Problem List Decreased strength;Decreased activity tolerance;Decreased balance;Decreased mobility       PT Treatment Interventions DME instruction;Gait training;Stair training;Functional mobility training;Therapeutic activities;Therapeutic exercise;Patient/family education    PT Goals (Current goals can be found in the Care Plan section)  Acute Rehab PT Goals Patient Stated Goal: HOME PT Goal Formulation: All assessment and education complete, DC therapy    Frequency Min 1X/week     Co-evaluation                AM-PAC PT "6 Clicks" Mobility  Outcome Measure Help needed turning from your back to your side while in a flat bed without using bedrails?: None Help needed moving from lying on your back to sitting on the side of a flat bed without using bedrails?: None Help needed moving to and from a bed to a chair (including a wheelchair)?: None Help needed standing up from a chair using your arms (e.g., wheelchair or bedside chair)?: None Help needed to walk in hospital room?: None Help needed climbing 3-5 steps with a railing? : A Little 6 Click Score: 23    End of Session Equipment Utilized During Treatment: Gait belt;Oxygen Activity Tolerance: Patient tolerated treatment well;Patient limited by fatigue Patient left: in bed;with call bell/phone within reach Nurse Communication: Mobility status PT Visit Diagnosis: Muscle weakness (generalized) (M62.81)    Time: 1610-9604 PT Time Calculation (min) (ACUTE ONLY): 22 min   Charges:   PT Evaluation $PT Eval Low Complexity: 1 Low   PT General Charges $$ ACUTE PT VISIT: 1 Visit         Mauro Kaufmann PT Acute Rehabilitation Services Pager [phone] Office [phone]   Wesley [name] 10/02/2023, 3:27 PM

## 2023-10-03 DIAGNOSIS — A419 Sepsis, unspecified organism: Secondary | ICD-10-CM | POA: Diagnosis not present

## 2023-10-03 DIAGNOSIS — C679 Malignant neoplasm of bladder, unspecified: Secondary | ICD-10-CM | POA: Diagnosis not present

## 2023-10-03 DIAGNOSIS — G40209 Localization-related (focal) (partial) symptomatic epilepsy and epileptic syndromes with complex partial seizures, not intractable, without status epilepticus: Secondary | ICD-10-CM | POA: Diagnosis not present

## 2023-10-03 DIAGNOSIS — N184 Chronic kidney disease, stage 4 (severe): Secondary | ICD-10-CM | POA: Diagnosis not present

## 2023-10-03 LAB — CBC
HCT: 33.2 % — ABNORMAL LOW (ref 39.0–52.0)
Hemoglobin: 10.9 g/dL — ABNORMAL LOW (ref 13.0–17.0)
MCH: 31 pg (ref 26.0–34.0)
MCHC: 32.8 g/dL (ref 30.0–36.0)
MCV: 94.3 fL (ref 80.0–100.0)
Platelets: 393 10*3/uL (ref 150–400)
RBC: 3.52 MIL/uL — ABNORMAL LOW (ref 4.22–5.81)
RDW: 14.5 % (ref 11.5–15.5)
WBC: 12.9 10*3/uL — ABNORMAL HIGH (ref 4.0–10.5)
nRBC: 0 % (ref 0.0–0.2)

## 2023-10-03 LAB — MAGNESIUM: Magnesium: 2.3 mg/dL (ref 1.7–2.4)

## 2023-10-03 LAB — URINE CULTURE: Culture: >=100000 — AB

## 2023-10-03 LAB — BASIC METABOLIC PANEL
Anion gap: 7 (ref 5–15)
BUN: 39 mg/dL — ABNORMAL HIGH (ref 8–23)
CO2: 20 mmol/L — ABNORMAL LOW (ref 22–32)
Calcium: 9.7 mg/dL (ref 8.9–10.3)
Chloride: 110 mmol/L (ref 98–111)
Creatinine, Ser: 1.86 mg/dL — ABNORMAL HIGH (ref 0.61–1.24)
GFR, Estimated: 38 mL/min — ABNORMAL LOW (ref >60)
Glucose, Bld: 138 mg/dL — ABNORMAL HIGH (ref 70–99)
Potassium: 3.7 mmol/L (ref 3.5–5.1)
Sodium: 137 mmol/L (ref 135–145)

## 2023-10-03 MED ORDER — CEFADROXIL 500 MG PO CAPS: 500.0000 mg | ORAL_CAPSULE | Freq: Two times a day (BID) | ORAL | 0 refills | Status: AC

## 2023-10-03 NOTE — Plan of Care (Addendum)
Patients urostomy bad leaking. Patients site was cleansed, stoma is pink and no signs of infection or irritation noted. New bag hooked onto existing foley bag for patients preference. Patient is stable and ambulatory, weaned off of oxygen, 98% on room air. Awaiting discharge orders to be placed. 1255: Discharge paperwork printed, explained to patient, PIV removed. Medications returned to patient. Wife notified and will be here to pick patient up.  Problem: Education: Goal: Knowledge of General Education information will improve Description: Including pain rating scale, medication(s)/side effects and non-pharmacologic comfort measures Outcome: Completed/Met   Problem: Health Behavior/Discharge Planning: Goal: Ability to manage health-related needs will improve Outcome: Completed/Met   Problem: Clinical Measurements: Goal: Ability to maintain clinical measurements within normal limits will improve Outcome: Completed/Met Goal: Will remain free from infection Outcome: Completed/Met Goal: Diagnostic test results will improve Outcome: Completed/Met Goal: Respiratory complications will improve Outcome: Completed/Met Goal: Cardiovascular complication will be avoided Outcome: Completed/Met   Problem: Activity: Goal: Risk for activity intolerance will decrease Outcome: Completed/Met   Problem: Nutrition: Goal: Adequate nutrition will be maintained Outcome: Completed/Met   Problem: Coping: Goal: Level of anxiety will decrease Outcome: Completed/Met   Problem: Elimination: Goal: Will not experience complications related to bowel motility Outcome: Completed/Met Goal: Will not experience complications related to urinary retention Outcome: Completed/Met   Problem: Pain Management: Goal: General experience of comfort will improve Outcome: Completed/Met   Problem: Safety: Goal: Ability to remain free from injury will improve Outcome: Completed/Met   Problem: Skin Integrity: Goal:  Risk for impaired skin integrity will decrease Outcome: Completed/Met

## 2023-10-03 NOTE — Discharge Summary (Signed)
Physician Discharge Summary  Wesley [name] RUE:[id number] DOB: 1951-03-27 DOA: 09/30/2023  PCP: [name], MD  Admit date: 09/30/2023 Discharge date: 10/03/2023  Admitted From: Home  Discharge disposition: Home   Recommendations for Outpatient Follow-Up:   Follow up with your primary care provider in one week.  Check CBC, BMP, magnesium in the next visit Follow-up with your urologist as scheduled by you.   Discharge Diagnosis:   Principal Problem:   Sepsis secondary to UTI [facility]) Active Problems:   Bladder cancer (HCC)   Complex partial seizures (HCC)   HTN (hypertension)   CKD (chronic kidney disease) stage 4, GFR 15-29 ml/min (HCC)   Ulcerative colitis (HCC)   UTI (urinary tract infection)    Discharge Condition: Improved.  Diet recommendation:   Regular.  Wound care: None.  Code status: Full.   History of Present Illness:   Wesley [name] is a 72 y.o. male with past medical history of bladder cancer status post cystectomy and ileal conduit in August 2024 presented to hospital with chills and fever.  Recently patient had a  Klebsiella UTI back in October of this year that was ampicillin resistant.  He also developed pleural effusion being followed by pulmonary as outpatient with weight loss.  Has a left lower abdominal subcutaneous swelling which has become painful as well.  In the ED, patient was noted to be febrile with a temperature of 101 F and was mildly tachypneic with tachycardia.  Labs showed leukocytosis at 15.7, hemoglobin 11.4.  Lactate was 0.9.  COVID influenza and RSV was negative.  Chemistry showed sodium of 132 with creatinine elevated at 2.3.  Urinalysis was positive for many bacteria and white cells in urine.  Blood cultures were sent from the ED and patient was admitted hospital for further evaluation and treatment.   Hospital Course:   Following conditions were addressed during hospitalization as listed below,  Sepsis  secondary  to Klebsiella pneumoniae UTI.   Blood cultures negative.  Urine culture with Klebsiella pneumonia.  Empirically received IV Rocephin urine hospitalization and will be changed to cefadroxil 500 twice daily for next 5 days on discharge to complete the course. Leukocytosis has improved at this time.  Patient feels clinically well.    History of bladder cancer status post robotic cystoprostatectomy cystectomy and ileal conduit.   Has persistent bilateral hydronephrosis.  Had seen urologist October 2024 and decision was to monitor clinically.  Creatinine appears to be at baseline. Patient has been followed by Dr. Berneice Heinrich as outpatient.  History of Klebsiella UTI resistant to ampicillin in the past.  Plan is to proceed with CT scan tomorrow as per [facility] to decide further course of action.   History of ulcerative colitis.previously taken Ustekinumab for same.  Not currently on any Biologics, currently on hold.   Mild hypokalemia.  Improved after replacement.  Potassium prior to discharge was 3.7   Complex partial seizures on Keppra, oxcarbazepine, primidone.  Will continue.   Lower abdominal nodule.  Likely metastatic  irregular lump noted in the left side.  I do not think there is any additional benefit by doing a biopsy at this time.  Patient was supposed to get scan of the abdomen as outpatient which has been arranged by [facility].  Patient however did have CT scan of the abdomen and pelvis here without any contrast.  Patient does have elevated creatinine level limiting contrast to use at this time.   History of partially loculated pleural effusion  being followed by pulmonary as outpatient.  CT scan with metastatic appearing nodules as well.  Had seen Dr. Arbutus Ped in the past.  Currently pulmonary wise is stable.  Disposition.  At this time, patient is stable for disposition home with outpatient PCP and urology follow-up.  Medical Consultants:   None.  Procedures:     None Subjective:   Today, patient was seen and examined at bedside.  Patient states that he feels better than yesterday.  Denies any fever, chills, nausea, vomiting,  Discharge Exam:   Vitals:   10/03/23 0836 10/03/23 1021  BP:    Pulse:    Resp:    Temp:    SpO2: 96% (S) 98%   Vitals:   10/03/23 0609 10/03/23 0835 10/03/23 0836 10/03/23 1021  BP: (!) 158/89     Pulse: 97     Resp:      Temp:      TempSrc:      SpO2:  96% 96% (S) 98%  Weight:      Height:       Body mass index is 18.54 kg/m.   General: Alert awake, not in obvious distress, elderly appearing male, HENT: pupils equally reacting to light,  No scleral pallor or icterus noted. Oral mucosa is moist.  Chest:  Clear breath sounds.   No crackles or wheezes.  CVS: S1 &S2 heard. No murmur.  Regular rate and rhythm. Abdomen: Soft, nontender, nondistended.  Bowel sounds are heard.  Urostomy bag in place, hard nodule over the left lower abdomen. Extremities: No cyanosis, clubbing or edema.  Peripheral pulses are palpable. Psych: Alert, awake and oriented, normal mood CNS:  No cranial nerve deficits.  Generalized weakness noted.  Thinly built Skin: Warm and dry.  No rashes noted.  The results of significant diagnostics from this hospitalization (including imaging, microbiology, ancillary and laboratory) are listed below for reference.     Diagnostic Studies:   CT ABDOMEN PELVIS WO CONTRAST Result Date: 09/30/2023 CLINICAL DATA:  History of bladder cancer status post cystectomy with fever. * Tracking Code: BO * EXAM: CT ABDOMEN AND PELVIS WITHOUT CONTRAST TECHNIQUE: Multidetector CT imaging of the abdomen and pelvis was performed following the standard protocol without IV contrast. RADIATION DOSE REDUCTION: This exam was performed according to the departmental dose-optimization program which includes automated exposure control, adjustment of the mA and/or kV according to patient size and/or use of iterative  reconstruction technique. COMPARISON:  CT abdomen and pelvis dated 09/17/2023 and multiple priors FINDINGS: Lower chest: Chronic right pleural effusion with adjacent medial right lower lobe rounded density, likely round atelectasis. Additional subsegmental atelectasis in the middle lobe and lingula. Irregular basilar right lower lobe nodule measuring 1.3 x 0.7 cm (5:23), increased in size from 5 x 4 mm on 07/22/2023. Unchanged right lower lobe perifissural nodules. Partially imaged heart size is normal. Coronary artery calcifications. Hepatobiliary: Ill-defined 1.8 cm hypodensity in segment 4/8 (2:13), incompletely characterized, but appears new compared to more remote studies. No intra or extrahepatic biliary ductal dilation. Gallbladder is contracted. Pancreas: No focal lesions or main ductal dilation. Spleen: Normal in size without focal abnormality. Adrenals/Urinary Tract: No adrenal nodules. Persistent bilateral hydroureteronephrosis. Asymmetric right renal atrophy. Multifocal bilateral cysts are again seen including peripherally calcified right lower pole cysts. No renal calculi. The ureters are not well seen in the absence of contrast. Prior cystectomy. Right lower quadrant urostomy. Stomach/Bowel: Normal appearance of the stomach. No evidence of bowel wall thickening, distention, or inflammatory changes. Moderate volume stool  throughout the colon. Appendix is not discretely seen. Vascular/Lymphatic: Aortic atherosclerosis. No enlarged abdominal or pelvic lymph nodes. Reproductive: Prostate brachytherapy seeds. Other: No free fluid, fluid collection, or free air. Musculoskeletal: Similar to slightly increased lytic changes of the left sacral ala. New lytic changes involving the left iliac bone adjacent to the sacroiliac joint. Degenerative changes of the hips, left-greater-than-right. Multilevel degenerative changes of the partially imaged thoracic and lumbar spine. IMPRESSION: 1. Ill-defined 1.8 cm  hypodensity in segment 4/8 of the liver, incompletely characterized, but appears new compared to more remote studies. Findings are suspicious for metastatic disease. Recommend further evaluation with contrast-enhanced MRI of the abdomen. Alternatively, attention on follow-up. 2. Similar to slightly increased lytic changes of the left sacral ala. New lytic changes involving the left iliac bone adjacent to the sacroiliac joint. 3. Irregular basilar right lower lobe nodule measuring 1.3 x 0.7 cm, increased in size from 5 x 4 mm on 07/22/2023, suspicious for metastatic disease. 4. Postsurgical change from cystectomy with right lower quadrant ostomy. Persistent bilateral hydroureteronephrosis. Ureters are not well evaluated in the absence of contrast. 5. Aortic Atherosclerosis (ICD10-I70.0). Coronary artery calcifications. Assessment for potential risk factor modification, dietary therapy or pharmacologic therapy may be warranted, if clinically indicated. Electronically Signed   By: Agustin Cree M.D.   On: 09/30/2023 20:28   DG Chest Port 1 View Result Date: 09/30/2023 CLINICAL DATA:  Fever.  Bladder cancer removed in August. EXAM: PORTABLE CHEST 1 VIEW COMPARISON:  09/02/2023 FINDINGS: The heart size and mediastinal contours are within normal limits. Both lungs are clear. The visualized skeletal structures are unremarkable. IMPRESSION: No active disease. Electronically Signed   By: Burman Nieves M.D.   On: 09/30/2023 20:11     Labs:   Basic Metabolic Panel: Recent Labs  Lab 09/30/23 1831 09/30/23 2259 10/01/23 0600 10/02/23 0547 10/03/23 0536  NA 132*  --  135 138 137  K 4.0  --  3.4* 4.3 3.7  CL 103  --  105 111 110  CO2 20*  --  18* 19* 20*  GLUCOSE 137*  --  108* 108* 138*  BUN 54*  --  47* 43* 39*  CREATININE 2.34* 2.11* 1.60* 1.71* 1.86*  CALCIUM 9.0  --  8.7* 9.2 9.7  MG  --   --   --  2.2 2.3   GFR Estimated Creatinine Clearance: 24.1 mL/min (A) (by C-G formula based on SCr of 1.86  mg/dL (H)). Liver Function Tests: Recent Labs  Lab 09/30/23 1831  AST 32  ALT 26  ALKPHOS 144*  BILITOT 0.5  PROT 7.2  ALBUMIN 2.8*   No results for input(s): "LIPASE", "AMYLASE" in the last 168 hours. No results for input(s): "AMMONIA" in the last 168 hours. Coagulation profile Recent Labs  Lab 09/30/23 1831 10/01/23 0600  INR 1.2 1.2    CBC: Recent Labs  Lab 09/30/23 1831 09/30/23 2259 10/01/23 0600 10/02/23 0547 10/03/23 0536  WBC 15.7* 16.6* 15.2* 9.6 12.9*  NEUTROABS 13.5*  --   --   --   --   HGB 11.4* 10.1* 10.8* 9.7* 10.9*  HCT 34.8* 31.0* 34.6* 29.9* 33.2*  MCV 93.8 95.4 98.0 94.9 94.3  PLT 417* 352 331 339 393   Cardiac Enzymes: No results for input(s): "CKTOTAL", "CKMB", "CKMBINDEX", "TROPONINI" in the last 168 hours. BNP: Invalid input(s): "POCBNP" CBG: No results for input(s): "GLUCAP" in the last 168 hours. D-Dimer No results for input(s): "DDIMER" in the last 72 hours. Hgb A1c No results  for input(s): "HGBA1C" in the last 72 hours. Lipid Profile No results for input(s): "CHOL", "HDL", "LDLCALC", "TRIG", "CHOLHDL", "LDLDIRECT" in the last 72 hours. Thyroid function studies No results for input(s): "TSH", "T4TOTAL", "T3FREE", "THYROIDAB" in the last 72 hours.  Invalid input(s): "FREET3" Anemia work up No results for input(s): "VITAMINB12", "FOLATE", "FERRITIN", "TIBC", "IRON", "RETICCTPCT" in the last 72 hours. Microbiology Recent Results (from the past 240 hours)  Culture, blood (routine x 2)     Status: None (Preliminary result)   Collection Time: 09/30/23  6:30 PM   Specimen: BLOOD LEFT HAND  Result Value Ref Range Status   Specimen Description   Final    BLOOD LEFT HAND Performed at [facility], [address] [place], [place] [address]    Special Requests   Final    BOTTLES DRAWN AEROBIC AND ANAEROBIC Blood Culture results may not be optimal due to an inadequate volume of blood received in culture bottles Performed at [facility], [address] [place], [place] [address]    Culture   Final    NO GROWTH 3 DAYS Performed at [facility], [address], [place], [place] [address]    Report Status PENDING  Incomplete  Culture, blood (routine x 2)     Status: None (Preliminary result)   Collection Time: 09/30/23  6:35 PM   Specimen: BLOOD RIGHT ARM  Result Value Ref Range Status   Specimen Description   Final    BLOOD RIGHT ARM Performed at [facility], [address] [place], [place] [address]    Special Requests   Final    BOTTLES DRAWN AEROBIC AND ANAEROBIC Blood Culture results may not be optimal due to an inadequate volume of blood received in culture bottles Performed at [facility], [address], [place], [place] [address]    Culture   Final    NO GROWTH 3 DAYS Performed at [facility], [address], [place], [place] [address]    Report Status PENDING  Incomplete  Resp panel by RT-PCR (RSV, Flu A&B, Covid) Anterior Nasal Swab     Status: None   Collection Time: 09/30/23  6:55 PM   Specimen: Anterior Nasal Swab  Result Value Ref Range Status   SARS Coronavirus 2 by RT PCR NEGATIVE NEGATIVE Final    Comment: (NOTE) SARS-CoV-2 target nucleic acids are NOT DETECTED.  The SARS-CoV-2 RNA is generally detectable in upper respiratory specimens during the acute phase of infection. The lowest concentration of SARS-CoV-2 viral copies this assay can detect is 138 copies/mL. A negative result does not preclude SARS-Cov-2 infection and should not be used as the sole basis for treatment or other patient management decisions. A negative result may occur with  improper specimen collection/handling, submission of specimen other than nasopharyngeal swab, presence of viral mutation(s) within the areas targeted by this assay, and inadequate number of viral copies(<138 copies/mL). A negative result must be combined with clinical observations,  patient history, and epidemiological information. The expected result is Negative.  Fact Sheet for Patients:  BloggerCourse.com  Fact Sheet for Healthcare Providers:  SeriousBroker.it  This test is no t yet approved or cleared by the [place] FDA and  has been authorized for detection and/or diagnosis of SARS-CoV-2 by FDA under an Emergency Use Authorization (EUA). This EUA will remain  in effect (meaning this test can be used) for the duration of the COVID-19 declaration under Section 564(b)(1) of  the Act, 21 U.S.C.section 360bbb-3(b)(1), unless the authorization is terminated  or revoked sooner.       Influenza A by PCR NEGATIVE NEGATIVE Final   Influenza B by PCR NEGATIVE NEGATIVE Final    Comment: (NOTE) The Xpert Xpress SARS-CoV-2/FLU/RSV plus assay is intended as an aid in the diagnosis of influenza from Nasopharyngeal swab specimens and should not be used as a sole basis for treatment. Nasal washings and aspirates are unacceptable for Xpert Xpress SARS-CoV-2/FLU/RSV testing.  Fact Sheet for Patients: BloggerCourse.com  Fact Sheet for Healthcare Providers: SeriousBroker.it  This test is not yet approved or cleared by the [place] FDA and has been authorized for detection and/or diagnosis of SARS-CoV-2 by FDA under an Emergency Use Authorization (EUA). This EUA will remain in effect (meaning this test can be used) for the duration of the COVID-19 declaration under Section 564(b)(1) of the Act, 21 U.S.C. section 360bbb-3(b)(1), unless the authorization is terminated or revoked.     Resp Syncytial Virus by PCR NEGATIVE NEGATIVE Final    Comment: (NOTE) Fact Sheet for Patients: BloggerCourse.com  Fact Sheet for Healthcare Providers: SeriousBroker.it  This test is not yet approved or cleared by the [place] FDA and has been authorized for detection and/or diagnosis of SARS-CoV-2 by FDA under an Emergency Use Authorization (EUA). This EUA will remain in effect (meaning this test can be used) for the duration of the COVID-19 declaration under Section 564(b)(1) of the Act, 21 U.S.C. section 360bbb-3(b)(1), unless the authorization is terminated or revoked.  Performed at [facility], [address] [place], [place] [address]   Urine Culture     Status: Abnormal   Collection Time: 09/30/23  7:25 PM   Specimen: Urine, Random  Result Value Ref Range Status   Specimen Description   Final    URINE, RANDOM Performed at [facility], [address], [place], [place] [address]    Special Requests   Final    NONE Reflexed from [id number] Performed at [facility], [address] [place], [place] [address]    Culture (A)  Final    >=100,000 COLONIES/mL KLEBSIELLA PNEUMONIAE 20,000 COLONIES/mL ENTEROCOCCUS GALLINARUM VANCOMYCIN RESISTANT ENTEROCOCCUS    Report Status 10/03/2023 FINAL  Final   Organism ID, Bacteria KLEBSIELLA PNEUMONIAE (A)  Final   Organism ID, Bacteria ENTEROCOCCUS GALLINARUM (A)  Final      Susceptibility   Enterococcus gallinarum - MIC*    AMPICILLIN <=2 SENSITIVE Sensitive     NITROFURANTOIN <=16 SENSITIVE Sensitive     VANCOMYCIN RESISTANT Resistant     * 20,000 COLONIES/mL ENTEROCOCCUS GALLINARUM   Klebsiella pneumoniae - MIC*    AMPICILLIN >=32 RESISTANT Resistant     CEFAZOLIN <=4 SENSITIVE Sensitive     CEFEPIME <=0.12 SENSITIVE Sensitive     CEFTRIAXONE <=0.25 SENSITIVE Sensitive     CIPROFLOXACIN <=0.25 SENSITIVE Sensitive     GENTAMICIN <=1 SENSITIVE Sensitive     IMIPENEM <=0.25 SENSITIVE Sensitive     NITROFURANTOIN 64 INTERMEDIATE Intermediate     TRIMETH/SULFA <=20 SENSITIVE Sensitive     AMPICILLIN/SULBACTAM >=32 RESISTANT Resistant     PIP/TAZO 16 SENSITIVE Sensitive ug/mL    * >=100,000  COLONIES/mL KLEBSIELLA PNEUMONIAE     Discharge Instructions:   Discharge Instructions     Call MD for:  persistant nausea and vomiting   Complete by: As directed    Call MD for:  temperature >100.4   Complete by: As directed  Diet general   Complete by: As directed    Discharge instructions   Complete by: As directed    Follow-up with your primary care provider in 1 week.  Check blood work at that time.  Complete the course of antibiotic.  Follow-up with your urologist as scheduled by you.  Seek medical attention for worsening symptoms.   Increase activity slowly   Complete by: As directed       Allergies as of 10/03/2023       Reactions   Sulfa Antibiotics Rash   Childhood        Medication List     STOP taking these medications    losartan 50 MG tablet Commonly known as: COZAAR       TAKE these medications    amLODipine 5 MG tablet Commonly known as: NORVASC Take 5 mg by mouth at bedtime.   Breo Ellipta 100-25 MCG/ACT Aepb Generic drug: fluticasone furoate-vilanterol INHALE 1 PUFF BY MOUTH EVERY DAY   cefadroxil 500 MG capsule Commonly known as: DURICEF Take 1 capsule (500 mg total) by mouth 2 (two) times daily for 5 days.   celecoxib 100 MG capsule Commonly known as: CELEBREX Take 100 mg by mouth every evening.   doxepin 25 MG capsule Commonly known as: SINEQUAN Take 25 mg by mouth at bedtime.   ferrous sulfate 325 (65 FE) MG tablet Take 325 mg by mouth every other day.   finasteride 5 MG tablet Commonly known as: PROSCAR Take 5 mg by mouth daily at 12 noon.   ketoconazole 2 % shampoo Commonly known as: NIZORAL Apply 1 Application topically daily as needed (yeast).   ketoconazole 2 % cream Commonly known as: NIZORAL Apply 1 Application topically daily as needed for irritation.   levETIRAcetam 500 MG tablet Commonly known as: KEPPRA Take 500 mg by mouth 3 (three) times daily.   linaclotide 145 MCG Caps capsule Commonly known as:  LINZESS Take 145 mcg by mouth daily.   Meclizine HCl 25 MG Chew Chew 1 tablet by mouth every 6 (six) hours as needed (dizziness).   mirtazapine 15 MG tablet Commonly known as: Remeron Take 1 tablet (15 mg total) by mouth at bedtime.   Oxtellar XR 600 MG Tb24 Generic drug: OXcarbazepine ER Take 600 mg by mouth daily in the afternoon.   polyethylene glycol 17 g packet Commonly known as: MIRALAX / GLYCOLAX Take 17 g by mouth 2 (two) times daily. What changed:  when to take this reasons to take this   primidone 50 MG tablet Commonly known as: MYSOLINE Take 100 mg by mouth at bedtime.   ustekinumab 90 MG/ML Sosy injection Commonly known as: STELARA Inject 90 mg into the skin every 8 (eight) weeks.   vitamin B-12 500 MCG tablet Commonly known as: CYANOCOBALAMIN Take 500 mcg by mouth daily.          Time coordinating discharge: 39 minutes  Signed:  Lubna Stegeman  [facility] 10/03/2023, 12:47 PM

## 2023-10-03 NOTE — Plan of Care (Signed)
  Problem: Clinical Measurements: Goal: Respiratory complications will improve Outcome: Progressing   Problem: Nutrition: Goal: Adequate nutrition will be maintained Outcome: Progressing   Problem: Safety: Goal: Ability to remain free from injury will improve Outcome: Progressing   

## 2023-10-05 LAB — CULTURE, BLOOD (ROUTINE X 2)
Culture: NO GROWTH
Culture: NO GROWTH

## 2023-10-08 DIAGNOSIS — C775 Secondary and unspecified malignant neoplasm of intrapelvic lymph nodes: Secondary | ICD-10-CM | POA: Diagnosis not present

## 2023-10-08 DIAGNOSIS — C61 Malignant neoplasm of prostate: Secondary | ICD-10-CM | POA: Diagnosis not present

## 2023-10-08 DIAGNOSIS — C787 Secondary malignant neoplasm of liver and intrahepatic bile duct: Secondary | ICD-10-CM | POA: Diagnosis not present

## 2023-10-08 DIAGNOSIS — C7951 Secondary malignant neoplasm of bone: Secondary | ICD-10-CM | POA: Diagnosis not present

## 2023-10-08 DIAGNOSIS — Z932 Ileostomy status: Secondary | ICD-10-CM | POA: Diagnosis not present

## 2023-10-14 ENCOUNTER — Emergency Department (HOSPITAL_COMMUNITY): Payer: Medicare Other

## 2023-10-14 ENCOUNTER — Inpatient Hospital Stay (HOSPITAL_COMMUNITY)
Admission: EM | Admit: 2023-10-14 | Discharge: 2023-10-26 | DRG: 871 | Disposition: A | Discharge status: Hospice | Payer: Medicare Other | Attending: Internal Medicine | Admitting: Internal Medicine

## 2023-10-14 DIAGNOSIS — L8941 Pressure ulcer of contiguous site of back, buttock and hip, stage 1: Secondary | ICD-10-CM | POA: Insufficient documentation

## 2023-10-14 DIAGNOSIS — R1084 Generalized abdominal pain: Secondary | ICD-10-CM | POA: Diagnosis not present

## 2023-10-14 DIAGNOSIS — E785 Hyperlipidemia, unspecified: Secondary | ICD-10-CM | POA: Diagnosis present

## 2023-10-14 DIAGNOSIS — A4159 Other Gram-negative sepsis: Secondary | ICD-10-CM | POA: Diagnosis present

## 2023-10-14 DIAGNOSIS — Z79899 Other long term (current) drug therapy: Secondary | ICD-10-CM

## 2023-10-14 DIAGNOSIS — B961 Klebsiella pneumoniae [K. pneumoniae] as the cause of diseases classified elsewhere: Secondary | ICD-10-CM | POA: Diagnosis present

## 2023-10-14 DIAGNOSIS — Z87440 Personal history of urinary (tract) infections: Secondary | ICD-10-CM

## 2023-10-14 DIAGNOSIS — R6520 Severe sepsis without septic shock: Secondary | ICD-10-CM | POA: Diagnosis present

## 2023-10-14 DIAGNOSIS — C786 Secondary malignant neoplasm of retroperitoneum and peritoneum: Secondary | ICD-10-CM | POA: Diagnosis present

## 2023-10-14 DIAGNOSIS — D631 Anemia in chronic kidney disease: Secondary | ICD-10-CM | POA: Diagnosis not present

## 2023-10-14 DIAGNOSIS — C7951 Secondary malignant neoplasm of bone: Secondary | ICD-10-CM | POA: Diagnosis present

## 2023-10-14 DIAGNOSIS — A4181 Sepsis due to Enterococcus: Principal | ICD-10-CM | POA: Diagnosis present

## 2023-10-14 DIAGNOSIS — R64 Cachexia: Secondary | ICD-10-CM | POA: Diagnosis present

## 2023-10-14 DIAGNOSIS — J1282 Pneumonia due to coronavirus disease 2019: Secondary | ICD-10-CM | POA: Diagnosis present

## 2023-10-14 DIAGNOSIS — A419 Sepsis, unspecified organism: Principal | ICD-10-CM | POA: Diagnosis present

## 2023-10-14 DIAGNOSIS — Z66 Do not resuscitate: Secondary | ICD-10-CM | POA: Diagnosis present

## 2023-10-14 DIAGNOSIS — I10 Essential (primary) hypertension: Secondary | ICD-10-CM | POA: Diagnosis not present

## 2023-10-14 DIAGNOSIS — C4442 Squamous cell carcinoma of skin of scalp and neck: Secondary | ICD-10-CM | POA: Diagnosis present

## 2023-10-14 DIAGNOSIS — J910 Malignant pleural effusion: Secondary | ICD-10-CM | POA: Diagnosis not present

## 2023-10-14 DIAGNOSIS — Z8551 Personal history of malignant neoplasm of bladder: Secondary | ICD-10-CM

## 2023-10-14 DIAGNOSIS — Z7401 Bed confinement status: Secondary | ICD-10-CM | POA: Diagnosis not present

## 2023-10-14 DIAGNOSIS — Z932 Ileostomy status: Secondary | ICD-10-CM | POA: Diagnosis not present

## 2023-10-14 DIAGNOSIS — Z86011 Personal history of benign neoplasm of the brain: Secondary | ICD-10-CM

## 2023-10-14 DIAGNOSIS — E538 Deficiency of other specified B group vitamins: Secondary | ICD-10-CM | POA: Diagnosis present

## 2023-10-14 DIAGNOSIS — N1330 Unspecified hydronephrosis: Secondary | ICD-10-CM | POA: Diagnosis not present

## 2023-10-14 DIAGNOSIS — B952 Enterococcus as the cause of diseases classified elsewhere: Secondary | ICD-10-CM | POA: Diagnosis present

## 2023-10-14 DIAGNOSIS — C7800 Secondary malignant neoplasm of unspecified lung: Secondary | ICD-10-CM | POA: Diagnosis not present

## 2023-10-14 DIAGNOSIS — N179 Acute kidney failure, unspecified: Secondary | ICD-10-CM | POA: Diagnosis present

## 2023-10-14 DIAGNOSIS — G250 Essential tremor: Secondary | ICD-10-CM | POA: Diagnosis present

## 2023-10-14 DIAGNOSIS — K769 Liver disease, unspecified: Secondary | ICD-10-CM | POA: Diagnosis not present

## 2023-10-14 DIAGNOSIS — Z1621 Resistance to vancomycin: Secondary | ICD-10-CM | POA: Diagnosis present

## 2023-10-14 DIAGNOSIS — Z7189 Other specified counseling: Secondary | ICD-10-CM

## 2023-10-14 DIAGNOSIS — K5909 Other constipation: Secondary | ICD-10-CM | POA: Diagnosis present

## 2023-10-14 DIAGNOSIS — Z882 Allergy status to sulfonamides status: Secondary | ICD-10-CM

## 2023-10-14 DIAGNOSIS — Z8546 Personal history of malignant neoplasm of prostate: Secondary | ICD-10-CM

## 2023-10-14 DIAGNOSIS — J90 Pleural effusion, not elsewhere classified: Secondary | ICD-10-CM

## 2023-10-14 DIAGNOSIS — U071 COVID-19: Secondary | ICD-10-CM | POA: Diagnosis present

## 2023-10-14 DIAGNOSIS — L89321 Pressure ulcer of left buttock, stage 1: Secondary | ICD-10-CM | POA: Diagnosis present

## 2023-10-14 DIAGNOSIS — Z87442 Personal history of urinary calculi: Secondary | ICD-10-CM

## 2023-10-14 DIAGNOSIS — N35919 Unspecified urethral stricture, male, unspecified site: Secondary | ICD-10-CM | POA: Diagnosis present

## 2023-10-14 DIAGNOSIS — I129 Hypertensive chronic kidney disease with stage 1 through stage 4 chronic kidney disease, or unspecified chronic kidney disease: Secondary | ICD-10-CM | POA: Diagnosis present

## 2023-10-14 DIAGNOSIS — Z681 Body mass index (BMI) 19 or less, adult: Secondary | ICD-10-CM | POA: Diagnosis not present

## 2023-10-14 DIAGNOSIS — A491 Streptococcal infection, unspecified site: Secondary | ICD-10-CM

## 2023-10-14 DIAGNOSIS — G40209 Localization-related (focal) (partial) symptomatic epilepsy and epileptic syndromes with complex partial seizures, not intractable, without status epilepticus: Secondary | ICD-10-CM | POA: Diagnosis present

## 2023-10-14 DIAGNOSIS — N281 Cyst of kidney, acquired: Secondary | ICD-10-CM | POA: Diagnosis not present

## 2023-10-14 DIAGNOSIS — E43 Unspecified severe protein-calorie malnutrition: Secondary | ICD-10-CM | POA: Diagnosis present

## 2023-10-14 DIAGNOSIS — N132 Hydronephrosis with renal and ureteral calculous obstruction: Secondary | ICD-10-CM | POA: Diagnosis not present

## 2023-10-14 DIAGNOSIS — G40909 Epilepsy, unspecified, not intractable, without status epilepticus: Secondary | ICD-10-CM

## 2023-10-14 DIAGNOSIS — R9389 Abnormal findings on diagnostic imaging of other specified body structures: Secondary | ICD-10-CM | POA: Diagnosis not present

## 2023-10-14 DIAGNOSIS — R000 Tachycardia, unspecified: Secondary | ICD-10-CM | POA: Diagnosis not present

## 2023-10-14 DIAGNOSIS — R0602 Shortness of breath: Secondary | ICD-10-CM | POA: Diagnosis not present

## 2023-10-14 DIAGNOSIS — R918 Other nonspecific abnormal finding of lung field: Secondary | ICD-10-CM | POA: Diagnosis not present

## 2023-10-14 DIAGNOSIS — N1832 Chronic kidney disease, stage 3b: Secondary | ICD-10-CM | POA: Diagnosis not present

## 2023-10-14 DIAGNOSIS — Z87891 Personal history of nicotine dependence: Secondary | ICD-10-CM

## 2023-10-14 DIAGNOSIS — R627 Adult failure to thrive: Secondary | ICD-10-CM | POA: Diagnosis present

## 2023-10-14 DIAGNOSIS — Z85828 Personal history of other malignant neoplasm of skin: Secondary | ICD-10-CM

## 2023-10-14 DIAGNOSIS — E872 Acidosis, unspecified: Secondary | ICD-10-CM | POA: Diagnosis present

## 2023-10-14 DIAGNOSIS — N261 Atrophy of kidney (terminal): Secondary | ICD-10-CM | POA: Diagnosis not present

## 2023-10-14 DIAGNOSIS — Z4682 Encounter for fitting and adjustment of non-vascular catheter: Secondary | ICD-10-CM | POA: Diagnosis not present

## 2023-10-14 DIAGNOSIS — R531 Weakness: Secondary | ICD-10-CM | POA: Diagnosis not present

## 2023-10-14 DIAGNOSIS — J939 Pneumothorax, unspecified: Secondary | ICD-10-CM | POA: Diagnosis present

## 2023-10-14 DIAGNOSIS — N136 Pyonephrosis: Secondary | ICD-10-CM | POA: Diagnosis present

## 2023-10-14 DIAGNOSIS — D649 Anemia, unspecified: Secondary | ICD-10-CM

## 2023-10-14 DIAGNOSIS — N19 Unspecified kidney failure: Secondary | ICD-10-CM | POA: Diagnosis not present

## 2023-10-14 DIAGNOSIS — J948 Other specified pleural conditions: Secondary | ICD-10-CM | POA: Diagnosis not present

## 2023-10-14 DIAGNOSIS — Z936 Other artificial openings of urinary tract status: Secondary | ICD-10-CM | POA: Diagnosis not present

## 2023-10-14 DIAGNOSIS — N401 Enlarged prostate with lower urinary tract symptoms: Secondary | ICD-10-CM | POA: Diagnosis present

## 2023-10-14 DIAGNOSIS — D509 Iron deficiency anemia, unspecified: Secondary | ICD-10-CM | POA: Diagnosis present

## 2023-10-14 DIAGNOSIS — R54 Age-related physical debility: Secondary | ICD-10-CM | POA: Diagnosis present

## 2023-10-14 DIAGNOSIS — N390 Urinary tract infection, site not specified: Secondary | ICD-10-CM | POA: Diagnosis not present

## 2023-10-14 DIAGNOSIS — C679 Malignant neoplasm of bladder, unspecified: Secondary | ICD-10-CM | POA: Diagnosis present

## 2023-10-14 DIAGNOSIS — Z923 Personal history of irradiation: Secondary | ICD-10-CM

## 2023-10-14 DIAGNOSIS — I700 Atherosclerosis of aorta: Secondary | ICD-10-CM | POA: Diagnosis not present

## 2023-10-14 DIAGNOSIS — J9811 Atelectasis: Secondary | ICD-10-CM | POA: Diagnosis not present

## 2023-10-14 DIAGNOSIS — R509 Fever, unspecified: Secondary | ICD-10-CM | POA: Diagnosis not present

## 2023-10-14 DIAGNOSIS — Z515 Encounter for palliative care: Secondary | ICD-10-CM | POA: Diagnosis not present

## 2023-10-14 DIAGNOSIS — Z906 Acquired absence of other parts of urinary tract: Secondary | ICD-10-CM

## 2023-10-14 LAB — CBC WITH DIFFERENTIAL/PLATELET
Abs Immature Granulocytes: 0.32 10*3/uL — ABNORMAL HIGH (ref 0.00–0.07)
Basophils Absolute: 0.1 10*3/uL (ref 0.0–0.1)
Basophils Relative: 0 %
Eosinophils Absolute: 0 10*3/uL (ref 0.0–0.5)
Eosinophils Relative: 0 %
HCT: 31.4 % — ABNORMAL LOW (ref 39.0–52.0)
Hemoglobin: 10.3 g/dL — ABNORMAL LOW (ref 13.0–17.0)
Immature Granulocytes: 1 %
Lymphocytes Relative: 1 %
Lymphs Abs: 0.3 10*3/uL — ABNORMAL LOW (ref 0.7–4.0)
MCH: 31.1 pg (ref 26.0–34.0)
MCHC: 32.8 g/dL (ref 30.0–36.0)
MCV: 94.9 fL (ref 80.0–100.0)
Monocytes Absolute: 1.1 10*3/uL — ABNORMAL HIGH (ref 0.1–1.0)
Monocytes Relative: 4 %
Neutro Abs: 24.8 10*3/uL — ABNORMAL HIGH (ref 1.7–7.7)
Neutrophils Relative %: 94 %
Platelets: 412 10*3/uL — ABNORMAL HIGH (ref 150–400)
RBC: 3.31 MIL/uL — ABNORMAL LOW (ref 4.22–5.81)
RDW: 15.7 % — ABNORMAL HIGH (ref 11.5–15.5)
WBC: 26.6 10*3/uL — ABNORMAL HIGH (ref 4.0–10.5)
nRBC: 0 % (ref 0.0–0.2)

## 2023-10-14 LAB — BLOOD GAS, VENOUS
Acid-base deficit: 6.9 mmol/L — ABNORMAL HIGH (ref 0.0–2.0)
Bicarbonate: 17.9 mmol/L — ABNORMAL LOW (ref 20.0–28.0)
O2 Saturation: 48.7 %
Patient temperature: 37
pCO2, Ven: 34 mm[Hg] — ABNORMAL LOW (ref 44–60)
pH, Ven: 7.33 (ref 7.25–7.43)
pO2, Ven: 32 mm[Hg] (ref 32–45)

## 2023-10-14 LAB — COMPREHENSIVE METABOLIC PANEL
ALT: 17 U/L (ref 0–44)
AST: 24 U/L (ref 15–41)
Albumin: 2.8 g/dL — ABNORMAL LOW (ref 3.5–5.0)
Alkaline Phosphatase: 157 U/L — ABNORMAL HIGH (ref 38–126)
Anion gap: 11 (ref 5–15)
BUN: 60 mg/dL — ABNORMAL HIGH (ref 8–23)
CO2: 15 mmol/L — ABNORMAL LOW (ref 22–32)
Calcium: 10.1 mg/dL (ref 8.9–10.3)
Chloride: 109 mmol/L (ref 98–111)
Creatinine, Ser: 2.58 mg/dL — ABNORMAL HIGH (ref 0.61–1.24)
GFR, Estimated: 25 mL/min — ABNORMAL LOW (ref >60)
Glucose, Bld: 123 mg/dL — ABNORMAL HIGH (ref 70–99)
Potassium: 3.9 mmol/L (ref 3.5–5.1)
Sodium: 135 mmol/L (ref 135–145)
Total Bilirubin: 0.6 mg/dL (ref 0.0–1.2)
Total Protein: 7 g/dL (ref 6.5–8.1)

## 2023-10-14 LAB — PROTIME-INR
INR: 1.2 (ref 0.8–1.2)
Prothrombin Time: 15.8 s — ABNORMAL HIGH (ref 11.4–15.2)

## 2023-10-14 LAB — I-STAT CG4 LACTIC ACID, ED: Lactic Acid, Venous: 1.2 mmol/L (ref 0.5–1.9)

## 2023-10-14 LAB — APTT: aPTT: 40 s — ABNORMAL HIGH (ref 24–36)

## 2023-10-14 MED ORDER — SODIUM CHLORIDE 0.9 % IV SOLN
2.0000 g | Freq: Once | INTRAVENOUS | Status: AC
  Administered 2023-10-14: 2 g via INTRAVENOUS
  Filled 2023-10-14: qty 12.5

## 2023-10-14 MED ORDER — LACTATED RINGERS IV SOLN: INTRAVENOUS | Status: DC

## 2023-10-14 NOTE — ED Notes (Signed)
 Pt's wife told me to "kiss her ass" and that if her husband dies due to sepsis we will all know something. She was informed not to threaten staff and was informed that security will escort her out.

## 2023-10-14 NOTE — ED Provider Notes (Signed)
 [place] EMERGENCY DEPARTMENT AT [facility] Provider Note   CSN: [id number] Arrival date & time: 10/14/23  2109     History  No chief complaint on file.   Wesley [name] is a 73 y.o. male.  Patient presents to the emergency department complaining of increased lethargy, decreased appetite, fever at home as high as 104 Fahrenheit.  Symptoms have been progressing over the past 3 or so days with the fever noticed today.  He was given 1000 mg of acetaminophen  at home prior to EMS arrival.  Temperature upon arrival rectally was 101.9 F with a heart rate of 1 4.  Code sepsis activated.  Patient with significant medical history including metastatic bladder cancer, history of urinary retention, urostomy in place, ulcerative colitis, prostate cancer, stage IV CKD.  Patient currently denying abdominal pain, nausea, vomiting, changes in urine output, chest pain, shortness of breath.  HPI     Home Medications Prior to Admission medications   Medication Sig Start Date End Date Taking? Authorizing Provider  amLODipine  (NORVASC ) 5 MG tablet Take 5 mg by mouth at bedtime.    [provider]  BREO ELLIPTA  100-25 MCG/ACT AEPB INHALE 1 PUFF BY MOUTH EVERY DAY 06/25/23   Olalere, Adewale A, MD  celecoxib (CELEBREX) 100 MG capsule Take 100 mg by mouth every evening.    [provider]  doxepin  (SINEQUAN ) 25 MG capsule Take 25 mg by mouth at bedtime.    [provider]  ferrous sulfate  325 (65 FE) MG tablet Take 325 mg by mouth every other day.    [provider]  finasteride  (PROSCAR ) 5 MG tablet Take 5 mg by mouth daily at 12 noon. 02/05/13   [provider]  ketoconazole (NIZORAL) 2 % cream Apply 1 Application topically daily as needed for irritation.    [provider]  ketoconazole (NIZORAL) 2 % shampoo Apply 1 Application topically daily as needed (yeast).    [provider]  levETIRAcetam  (KEPPRA ) 500 MG tablet Take 500 mg by  mouth 3 (three) times daily.    [provider]  linaclotide  (LINZESS ) 145 MCG CAPS capsule Take 145 mcg by mouth daily. 09/26/23 03/24/24  [provider]  Meclizine  HCl 25 MG CHEW Chew 1 tablet by mouth every 6 (six) hours as needed (dizziness).    [provider]  mirtazapine  (REMERON ) 15 MG tablet Take 1 tablet (15 mg total) by mouth at bedtime. Patient not taking: Reported on 09/30/2023 09/23/23   Neda Jennet LABOR, MD  OXcarbazepine  ER (OXTELLAR XR ) 600 MG TB24 Take 600 mg by mouth daily in the afternoon.    [provider]  polyethylene glycol (MIRALAX  / GLYCOLAX ) 17 g packet Take 17 g by mouth 2 (two) times daily. Patient taking differently: Take 17 g by mouth daily as needed for moderate constipation. 07/25/23   Cheryle Page, MD  primidone  (MYSOLINE ) 50 MG tablet Take 100 mg by mouth at bedtime.    [provider]  ustekinumab (STELARA) 90 MG/ML SOSY injection Inject 90 mg into the skin every 8 (eight) weeks.    [provider]  vitamin B-12 (CYANOCOBALAMIN) 500 MCG tablet Take 500 mcg by mouth daily.    [provider]      Allergies    Sulfa antibiotics    Review of Systems   Review of Systems  Physical Exam Updated Vital Signs BP 138/66   Pulse 83   Temp 97.9 F (36.6 C) (Oral)   Resp 13  Ht 5' 4 (1.626 m)   Wt 49.7 kg   SpO2 100%   BMI 18.81 kg/m  Physical Exam Vitals and nursing note reviewed.  Constitutional:      Appearance: He is well-developed.     Comments: Cachectic appearing  HENT:     Head: Normocephalic and atraumatic.  Eyes:     Conjunctiva/sclera: Conjunctivae normal.  Cardiovascular:     Rate and Rhythm: Regular rhythm. Tachycardia present.  Pulmonary:     Effort: Pulmonary effort is normal. No respiratory distress.     Breath sounds: Normal breath sounds.  Abdominal:     Palpations: Abdomen is soft.     Tenderness: There is no abdominal tenderness.     Comments: Nodule  reported to be related to metastases at [facility]  Musculoskeletal:        General: No swelling.     Cervical back: Neck supple.  Skin:    General: Skin is warm and dry.     Capillary Refill: Capillary refill takes less than 2 seconds.  Neurological:     Mental Status: He is alert and oriented to person, place, and time.  Psychiatric:        Mood and Affect: Mood normal.     ED Results / Procedures / Treatments   Labs (all labs ordered are listed, but only abnormal results are displayed) Labs Reviewed  RESP PANEL BY RT-PCR (RSV, FLU A&B, COVID)  RVPGX2 - Abnormal; Notable for the following components:      Result Value   SARS Coronavirus 2 by RT PCR POSITIVE (*)    All other components within normal limits  COMPREHENSIVE METABOLIC PANEL - Abnormal; Notable for the following components:   CO2 15 (*)    Glucose, Bld 123 (*)    BUN 60 (*)    Creatinine, Ser 2.58 (*)    Albumin 2.8 (*)    Alkaline Phosphatase 157 (*)    GFR, Estimated 25 (*)    All other components within normal limits  CBC WITH DIFFERENTIAL/PLATELET - Abnormal; Notable for the following components:   WBC 26.6 (*)    RBC 3.31 (*)    Hemoglobin 10.3 (*)    HCT 31.4 (*)    RDW 15.7 (*)    Platelets 412 (*)    Neutro Abs 24.8 (*)    Lymphs Abs 0.3 (*)    Monocytes Absolute 1.1 (*)    Abs Immature Granulocytes 0.32 (*)    All other components within normal limits  PROTIME-INR - Abnormal; Notable for the following components:   Prothrombin Time 15.8 (*)    All other components within normal limits  APTT - Abnormal; Notable for the following components:   aPTT 40 (*)    All other components within normal limits  URINALYSIS, W/ REFLEX TO CULTURE (INFECTION SUSPECTED) - Abnormal; Notable for the following components:   APPearance TURBID (*)    Hgb urine dipstick SMALL (*)    Protein, ur 30 (*)    Leukocytes,Ua LARGE (*)    Bacteria, UA MANY (*)    All other components within normal limits  BLOOD GAS, VENOUS -  Abnormal; Notable for the following components:   pCO2, Ven 34 (*)    Bicarbonate 17.9 (*)    Acid-base deficit 6.9 (*)    All other components within normal limits  CULTURE, BLOOD (ROUTINE X 2)  CULTURE, BLOOD (ROUTINE X 2)  URINE CULTURE  I-STAT CG4 LACTIC ACID, ED  I-STAT CG4 LACTIC ACID,  ED    EKG None  Radiology CT CHEST ABDOMEN PELVIS WO CONTRAST Result Date: 10/15/2023 CLINICAL DATA:  Set CIS, elevated white count. EXAM: CT CHEST, ABDOMEN AND PELVIS WITHOUT CONTRAST TECHNIQUE: Multidetector CT imaging of the chest, abdomen and pelvis was performed following the standard protocol without IV contrast. RADIATION DOSE REDUCTION: This exam was performed according to the departmental dose-optimization program which includes automated exposure control, adjustment of the mA and/or kV according to patient size and/or use of iterative reconstruction technique. COMPARISON:  CT abdomen and pelvis 09/30/2023.  Chest CT 07/19/2023 FINDINGS: CT CHEST FINDINGS Cardiovascular: Heart is normal size. Aorta is normal caliber. Diffuse 3 vessel coronary artery disease. Scattered aortic atherosclerosis. Mediastinum/Nodes: No mediastinal, hilar, or axillary adenopathy. Trachea and esophagus are unremarkable. Thyroid  unremarkable. Lungs/Pleura: Loculated moderate right pleural effusion, increasing since prior chest CT. Airspace opacity in the right lower lobe and right middle lobe could reflect atelectasis or pneumonia. Predominantly linear opacities in the lingula and left lower lobe could reflect scarring or atelectasis. Subpleural nodularity noted along the left fissure and posteriorly. Left upper lobe nodule on image 37 measures 8 mm, stable since prior study. Left upper lobe nodule on image 36 measures 4 mm, new since prior study. Musculoskeletal: Chest wall soft tissues are unremarkable. No acute bony abnormality. CT ABDOMEN PELVIS FINDINGS Hepatobiliary: 1.9 cm low-density lesion in the right hepatic lobe. 1.8  cm previously, difficult to characterize on this noncontrast study. Gallbladder unremarkable. No biliary ductal dilatation. Pancreas: No focal abnormality or ductal dilatation. Spleen: No focal abnormality.  Normal size. Adrenals/Urinary Tract: Adrenal glands unremarkable. Persistent bilateral hydronephrosis, unchanged since prior study. Nonobstructing stones in the lower pole of the right kidney. Right kidney is atrophic. Multiple bilateral renal cysts are unchanged. Prior cystectomy with right lower quadrant urostomy. Ureters are difficult to follow without contrast. Stomach/Bowel: Moderate to large stool burden throughout the colon, similar to prior study. No bowel obstruction. Vascular/Lymphatic: Aortoiliac atherosclerosis. No evidence of aneurysm or adenopathy. 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 Other: No free fluid or free air. Musculoskeletal: Again noted is the lytic area within the left sacral ala and adjacent left iliac bone, unchanged since recent study. Degenerative disc and facet disease in the lumbar spine. IMPRESSION: Enlarging chronic loculated right pleural effusion and increasing right lower lobe airspace disease. This could reflect atelectasis or pneumonia. Nodularity noted along the left fissure and subpleural locations, increasing since prior study. Cannot exclude metastases. New 4 mm left upper lobe pulmonary nodule. Recommend attention on follow-up imaging. Diffuse coronary artery disease.  Aortic atherosclerosis. 1.9 cm low-density lesion in the right hepatic lobe, difficult to characterize on this noncontrast study. Prior cystectomy with right lower quadrant urostomy. Stable chronic bilateral hydronephrosis. Moderate to large stool burden throughout the colon. Stable lytic areas within the left sacrum and adjacent left iliac bone. Electronically Signed   By: Franky Crease M.D.   On: 10/15/2023 01:40   DG Chest Port 1 View Result Date: 10/14/2023 CLINICAL DATA:   Weakness EXAM: PORTABLE CHEST 1 VIEW COMPARISON:  09/30/2023 FINDINGS: Cardiac shadow is within normal limits. Increased density is noted over the right hemithorax which is new from the prior exam. The appearance suggests a loculated effusion. CT of the chest may be helpful for further evaluation. Left lung is clear. No acute bony abnormality is noted IMPRESSION: Increased density over the right hemithorax suggesting a loculated effusion. CT of the chest may be helpful for further evaluation. Electronically Signed   By: Oneil  Lukens M.D.   On: 10/14/2023 22:52    Procedures .Critical Care  Performed by: Logan Ubaldo NOVAK, PA-C Authorized by: Logan Ubaldo NOVAK, PA-C   Critical care provider statement:    Critical care time (minutes):  30   Critical care was necessary to treat or prevent imminent or life-threatening deterioration of the following conditions:  Sepsis   Critical care was time spent personally by me on the following activities:  Development of treatment plan with patient or surrogate, discussions with consultants, evaluation of patient's response to treatment, examination of patient, ordering and review of laboratory studies, ordering and review of radiographic studies, ordering and performing treatments and interventions, pulse oximetry, re-evaluation of patient's condition and review of old charts   Care discussed with: admitting provider       Medications Ordered in ED Medications  lactated ringers  infusion ( Intravenous New Bag/Given 10/15/23 0426)  ceFEPIme  (MAXIPIME ) 2 g in sodium chloride  0.9 % 100 mL IVPB (0 g Intravenous Stopped 10/14/23 2329)  lactated ringers  bolus 1,000 mL (0 mLs Intravenous Stopped 10/15/23 0300)    ED Course/ Medical Decision Making/ A&P                                 Medical Decision Making Amount and/or Complexity of Data Reviewed Labs: ordered. Radiology: ordered.  Risk Prescription drug management. Decision regarding  hospitalization.   This patient presents to the ED for concern of fever with increased weakness and decreased appetite, this involves an extensive number of treatment options, and is a complaint that carries with it a high risk of complications and morbidity.  The differential diagnosis includes urosepsis, sepsis due to other reasons, influenza, COVID, others   Co morbidities that complicate the patient evaluation  Metastatic cancer   Additional history obtained:  Additional history obtained from patient's wife, EMS External records from outside source obtained and reviewed including discharge summary from December 26, patient admitted due to sepsis with associated UTI   Lab Tests:  I Ordered, and personally interpreted labs.  The pertinent results include: WBC 26,600, lactic acid 1.2, creatinine 2.58, worsened from baseline of approximately 1.6-1.8; positive COVID test; large leukocytes, many bacteria, greater than 50 WBC on UA   Imaging Studies ordered:  I ordered imaging studies including chest x-ray and CT chest abdomen pelvis without contrast (due to GFR) I independently visualized and interpreted imaging which showed  Increased density over the right hemithorax suggesting a loculated  effusion. CT of the chest may be helpful for further evaluation.   Enlarging chronic loculated right pleural effusion and increasing  right lower lobe airspace disease. This could reflect atelectasis or  pneumonia.    Nodularity noted along the left fissure and subpleural locations,  increasing since prior study. Cannot exclude metastases. New 4 mm  left upper lobe pulmonary nodule. Recommend attention on follow-up  imaging.    Diffuse coronary artery disease.  Aortic atherosclerosis.    1.9 cm low-density lesion in the right hepatic lobe, difficult to  characterize on this noncontrast study.    Prior cystectomy with right lower quadrant urostomy. Stable chronic  bilateral  hydronephrosis.    Moderate to large stool burden throughout the colon.    Stable lytic areas within the left sacrum and adjacent left iliac  bone.   I agree with the radiologist interpretation   Cardiac Monitoring: / EKG:  The patient was maintained on a cardiac monitor.  I  personally viewed and interpreted the cardiac monitored which showed an underlying rhythm of: sinus rhythm   Consultations Obtained:  I requested consultation with the hospitalist, Dr. Alfornia and discussed lab and imaging findings as well as pertinent plan - they recommend: admission by day team   Problem List / ED Course / Critical interventions / Medication management   I ordered medication including cefepime  for antibiotics, lactated ringer  bolus for fluid resuscitation Reevaluation of the patient after these medicines showed that the patient improved I have reviewed the patients home medicines and have made adjustments as needed   Social Determinants of Health:  Patient has metastatic cancer   Test / Admission - Considered:  Patient's labs consistent with urosepsis with leukocytosis of 26,600, UA concerning for infection.  Patient with AKI and COVID infection.  Patient needs admission for further antibiotics and fluids.         Final Clinical Impression(s) / ED Diagnoses Final diagnoses:  Sepsis with acute renal failure without septic shock, due to unspecified organism, unspecified acute renal failure type (HCC)  COVID-19  AKI (acute kidney injury) [facility])    Rx / DC Orders ED Discharge Orders     None         Logan Ubaldo KATHEE DEVONNA 10/15/23 0557    Griselda Norris, MD 10/15/23 [phone]

## 2023-10-15 ENCOUNTER — Inpatient Hospital Stay (HOSPITAL_COMMUNITY): Payer: Medicare Other

## 2023-10-15 ENCOUNTER — Other Ambulatory Visit: Payer: Self-pay

## 2023-10-15 ENCOUNTER — Other Ambulatory Visit: Payer: Self-pay | Admitting: Pulmonary Disease

## 2023-10-15 ENCOUNTER — Emergency Department (HOSPITAL_COMMUNITY): Payer: Medicare Other

## 2023-10-15 ENCOUNTER — Encounter (HOSPITAL_COMMUNITY): Payer: Self-pay | Admitting: Internal Medicine

## 2023-10-15 DIAGNOSIS — Z66 Do not resuscitate: Secondary | ICD-10-CM | POA: Diagnosis present

## 2023-10-15 DIAGNOSIS — I129 Hypertensive chronic kidney disease with stage 1 through stage 4 chronic kidney disease, or unspecified chronic kidney disease: Secondary | ICD-10-CM | POA: Diagnosis present

## 2023-10-15 DIAGNOSIS — C7800 Secondary malignant neoplasm of unspecified lung: Secondary | ICD-10-CM | POA: Diagnosis present

## 2023-10-15 DIAGNOSIS — E872 Acidosis, unspecified: Secondary | ICD-10-CM | POA: Diagnosis present

## 2023-10-15 DIAGNOSIS — C679 Malignant neoplasm of bladder, unspecified: Secondary | ICD-10-CM | POA: Diagnosis present

## 2023-10-15 DIAGNOSIS — N1832 Chronic kidney disease, stage 3b: Secondary | ICD-10-CM | POA: Diagnosis present

## 2023-10-15 DIAGNOSIS — D631 Anemia in chronic kidney disease: Secondary | ICD-10-CM | POA: Diagnosis present

## 2023-10-15 DIAGNOSIS — A4181 Sepsis due to Enterococcus: Secondary | ICD-10-CM | POA: Diagnosis present

## 2023-10-15 DIAGNOSIS — E43 Unspecified severe protein-calorie malnutrition: Secondary | ICD-10-CM | POA: Diagnosis present

## 2023-10-15 DIAGNOSIS — J939 Pneumothorax, unspecified: Secondary | ICD-10-CM | POA: Diagnosis present

## 2023-10-15 DIAGNOSIS — R64 Cachexia: Secondary | ICD-10-CM | POA: Diagnosis present

## 2023-10-15 DIAGNOSIS — R6520 Severe sepsis without septic shock: Secondary | ICD-10-CM | POA: Diagnosis present

## 2023-10-15 DIAGNOSIS — C7951 Secondary malignant neoplasm of bone: Secondary | ICD-10-CM | POA: Diagnosis present

## 2023-10-15 DIAGNOSIS — U071 COVID-19: Secondary | ICD-10-CM | POA: Diagnosis present

## 2023-10-15 DIAGNOSIS — A419 Sepsis, unspecified organism: Secondary | ICD-10-CM

## 2023-10-15 DIAGNOSIS — Z681 Body mass index (BMI) 19 or less, adult: Secondary | ICD-10-CM | POA: Diagnosis not present

## 2023-10-15 DIAGNOSIS — C786 Secondary malignant neoplasm of retroperitoneum and peritoneum: Secondary | ICD-10-CM | POA: Diagnosis present

## 2023-10-15 DIAGNOSIS — A4159 Other Gram-negative sepsis: Secondary | ICD-10-CM | POA: Diagnosis present

## 2023-10-15 DIAGNOSIS — N136 Pyonephrosis: Secondary | ICD-10-CM | POA: Diagnosis present

## 2023-10-15 DIAGNOSIS — I10 Essential (primary) hypertension: Secondary | ICD-10-CM | POA: Diagnosis not present

## 2023-10-15 DIAGNOSIS — J1282 Pneumonia due to coronavirus disease 2019: Secondary | ICD-10-CM | POA: Diagnosis present

## 2023-10-15 DIAGNOSIS — Z515 Encounter for palliative care: Secondary | ICD-10-CM | POA: Diagnosis not present

## 2023-10-15 DIAGNOSIS — G40209 Localization-related (focal) (partial) symptomatic epilepsy and epileptic syndromes with complex partial seizures, not intractable, without status epilepticus: Secondary | ICD-10-CM | POA: Diagnosis present

## 2023-10-15 DIAGNOSIS — Z1621 Resistance to vancomycin: Secondary | ICD-10-CM | POA: Diagnosis present

## 2023-10-15 DIAGNOSIS — N179 Acute kidney failure, unspecified: Secondary | ICD-10-CM | POA: Diagnosis present

## 2023-10-15 DIAGNOSIS — J910 Malignant pleural effusion: Secondary | ICD-10-CM | POA: Diagnosis present

## 2023-10-15 DIAGNOSIS — J90 Pleural effusion, not elsewhere classified: Secondary | ICD-10-CM | POA: Diagnosis not present

## 2023-10-15 LAB — RESP PANEL BY RT-PCR (RSV, FLU A&B, COVID)  RVPGX2
Influenza A by PCR: NEGATIVE
Influenza B by PCR: NEGATIVE
Resp Syncytial Virus by PCR: NEGATIVE
SARS Coronavirus 2 by RT PCR: POSITIVE — AB

## 2023-10-15 LAB — URINALYSIS, W/ REFLEX TO CULTURE (INFECTION SUSPECTED)
Bilirubin Urine: NEGATIVE
Glucose, UA: NEGATIVE mg/dL
Ketones, ur: NEGATIVE mg/dL
Nitrite: NEGATIVE
Protein, ur: 30 mg/dL — AB
Specific Gravity, Urine: 1.013 (ref 1.005–1.030)
WBC, UA: >50 WBC/hpf (ref 0–5)
pH: 6 (ref 5.0–8.0)

## 2023-10-15 LAB — BODY FLUID CELL COUNT WITH DIFFERENTIAL
Eos, Fluid: 0 %
Lymphs, Fluid: 24 %
Monocyte-Macrophage-Serous Fluid: 10 % — ABNORMAL LOW (ref 50–90)
Neutrophil Count, Fluid: 66 % — ABNORMAL HIGH (ref 0–25)
Total Nucleated Cell Count, Fluid: 312 uL (ref 0–1000)

## 2023-10-15 LAB — PROTEIN, PLEURAL OR PERITONEAL FLUID: Total protein, fluid: 3.4 g/dL

## 2023-10-15 LAB — LACTATE DEHYDROGENASE: LDH: 298 U/L — ABNORMAL HIGH (ref 98–192)

## 2023-10-15 LAB — LACTATE DEHYDROGENASE, PLEURAL OR PERITONEAL FLUID: LD, Fluid: 140 U/L — ABNORMAL HIGH (ref 3–23)

## 2023-10-15 LAB — GLUCOSE, PLEURAL OR PERITONEAL FLUID: Glucose, Fluid: 97 mg/dL

## 2023-10-15 LAB — MRSA NEXT GEN BY PCR, NASAL: MRSA by PCR Next Gen: NOT DETECTED

## 2023-10-15 LAB — PROTEIN, TOTAL: Total Protein: 6.5 g/dL (ref 6.5–8.1)

## 2023-10-15 MED ORDER — SODIUM CHLORIDE 0.9% FLUSH
10.0000 mL | Freq: Three times a day (TID) | INTRAVENOUS | Status: DC
  Administered 2023-10-15 – 2023-10-20 (×13): 10 mL via INTRAPLEURAL

## 2023-10-15 MED ORDER — LINACLOTIDE 145 MCG PO CAPS
145.0000 ug | ORAL_CAPSULE | Freq: Every day | ORAL | Status: DC
  Administered 2023-10-15 – 2023-10-22 (×7): 145 ug via ORAL
  Filled 2023-10-15 (×13): qty 1

## 2023-10-15 MED ORDER — PIPERACILLIN-TAZOBACTAM IN DEX 2-0.25 GM/50ML IV SOLN
2.2500 g | Freq: Three times a day (TID) | INTRAVENOUS | Status: DC
  Administered 2023-10-15 – 2023-10-16 (×3): 2.25 g via INTRAVENOUS
  Filled 2023-10-15 (×5): qty 50

## 2023-10-15 MED ORDER — ONDANSETRON HCL 4 MG/2ML IJ SOLN: 4.0000 mg | Freq: Four times a day (QID) | INTRAMUSCULAR | Status: DC | PRN

## 2023-10-15 MED ORDER — ACETAMINOPHEN 650 MG RE SUPP: 650.0000 mg | Freq: Four times a day (QID) | RECTAL | Status: DC | PRN

## 2023-10-15 MED ORDER — TRAZODONE HCL 50 MG PO TABS
25.0000 mg | ORAL_TABLET | Freq: Every evening | ORAL | Status: DC | PRN
  Administered 2023-10-15 – 2023-10-25 (×4): 25 mg via ORAL
  Filled 2023-10-15 (×4): qty 1

## 2023-10-15 MED ORDER — POLYETHYLENE GLYCOL 3350 17 G PO PACK: 17.0000 g | PACK | Freq: Every day | ORAL | Status: DC | PRN

## 2023-10-15 MED ORDER — LEVETIRACETAM 500 MG PO TABS
500.0000 mg | ORAL_TABLET | Freq: Three times a day (TID) | ORAL | Status: DC
  Administered 2023-10-15: 500 mg via ORAL
  Filled 2023-10-15: qty 1

## 2023-10-15 MED ORDER — ENOXAPARIN SODIUM 40 MG/0.4ML IJ SOSY: 40.0000 mg | PREFILLED_SYRINGE | INTRAMUSCULAR | Status: DC

## 2023-10-15 MED ORDER — LEVETIRACETAM 500 MG PO TABS
500.0000 mg | ORAL_TABLET | Freq: Two times a day (BID) | ORAL | Status: DC
  Administered 2023-10-15 – 2023-10-25 (×20): 500 mg via ORAL
  Filled 2023-10-15 (×20): qty 1

## 2023-10-15 MED ORDER — PRIMIDONE 50 MG PO TABS
100.0000 mg | ORAL_TABLET | Freq: Every day | ORAL | Status: DC
  Administered 2023-10-15 – 2023-10-25 (×11): 100 mg via ORAL
  Filled 2023-10-15 (×11): qty 2

## 2023-10-15 MED ORDER — DOXEPIN HCL 25 MG PO CAPS
25.0000 mg | ORAL_CAPSULE | Freq: Every day | ORAL | Status: DC
  Administered 2023-10-15 – 2023-10-25 (×11): 25 mg via ORAL
  Filled 2023-10-15 (×11): qty 1

## 2023-10-15 MED ORDER — LACTATED RINGERS IV BOLUS
1000.0000 mL | Freq: Once | INTRAVENOUS | Status: AC
  Administered 2023-10-15: 1000 mL via INTRAVENOUS

## 2023-10-15 MED ORDER — MIDAZOLAM HCL 2 MG/2ML IJ SOLN
2.0000 mg | Freq: Once | INTRAMUSCULAR | Status: AC
  Administered 2023-10-15: 2 mg via INTRAVENOUS
  Filled 2023-10-15: qty 2

## 2023-10-15 MED ORDER — OXYCODONE-ACETAMINOPHEN 5-325 MG PO TABS
1.0000 | ORAL_TABLET | Freq: Four times a day (QID) | ORAL | Status: DC | PRN
  Administered 2023-10-15: 2 via ORAL
  Administered 2023-10-16: 1 via ORAL
  Administered 2023-10-16 – 2023-10-17 (×2): 2 via ORAL
  Filled 2023-10-15: qty 2
  Filled 2023-10-15: qty 1
  Filled 2023-10-15 (×3): qty 2
  Filled 2023-10-15: qty 1

## 2023-10-15 MED ORDER — ONDANSETRON HCL 4 MG PO TABS: 4.0000 mg | ORAL_TABLET | Freq: Four times a day (QID) | ORAL | Status: DC | PRN

## 2023-10-15 MED ORDER — AMLODIPINE BESYLATE 5 MG PO TABS
5.0000 mg | ORAL_TABLET | Freq: Every day | ORAL | Status: DC
  Administered 2023-10-15 – 2023-10-25 (×11): 5 mg via ORAL
  Filled 2023-10-15 (×11): qty 1

## 2023-10-15 MED ORDER — PIPERACILLIN-TAZOBACTAM 3.375 G IVPB 30 MIN
3.3750 g | Freq: Once | INTRAVENOUS | Status: AC
Start: 2023-10-15 — End: 2023-10-15
  Administered 2023-10-15: 3.375 g via INTRAVENOUS
  Filled 2023-10-15: qty 50

## 2023-10-15 MED ORDER — HEPARIN SODIUM (PORCINE) 5000 UNIT/ML IJ SOLN
5000.0000 [IU] | Freq: Three times a day (TID) | INTRAMUSCULAR | Status: DC
  Administered 2023-10-15 – 2023-10-26 (×31): 5000 [IU] via SUBCUTANEOUS
  Filled 2023-10-15 (×32): qty 1

## 2023-10-15 MED ORDER — ENSURE ENLIVE PO LIQD
237.0000 mL | Freq: Two times a day (BID) | ORAL | Status: DC
  Administered 2023-10-16 – 2023-10-25 (×15): 237 mL via ORAL

## 2023-10-15 MED ORDER — OXCARBAZEPINE ER 600 MG PO TB24
600.0000 mg | ORAL_TABLET | Freq: Every day | ORAL | Status: AC
  Administered 2023-10-15 – 2023-10-25 (×11): 600 mg via ORAL
  Filled 2023-10-15 (×15): qty 1

## 2023-10-15 MED ORDER — FLUTICASONE FUROATE-VILANTEROL 100-25 MCG/ACT IN AEPB
1.0000 | INHALATION_SPRAY | Freq: Every day | RESPIRATORY_TRACT | Status: DC
  Administered 2023-10-16 – 2023-10-26 (×10): 1 via RESPIRATORY_TRACT
  Filled 2023-10-15: qty 28

## 2023-10-15 MED ORDER — ACETAMINOPHEN 325 MG PO TABS
650.0000 mg | ORAL_TABLET | Freq: Four times a day (QID) | ORAL | Status: DC | PRN
  Administered 2023-10-16: 650 mg via ORAL
  Filled 2023-10-15: qty 2

## 2023-10-15 MED ORDER — MECLIZINE HCL 25 MG PO TABS
25.0000 mg | ORAL_TABLET | Freq: Four times a day (QID) | ORAL | Status: DC | PRN
Start: 2023-10-15 — End: 2023-10-26

## 2023-10-15 NOTE — H&P (Signed)
 History and Physical  Wesley [name] FMW:[id number] DOB: 02/26/1951 DOA: 10/14/2023  PCP: [name], MD   Chief Complaint: Fever   HPI: Wesley [name] is a 73 y.o. male with medical history significant for presumed metastatic bladder cancer status post cystectomy and ileal conduit August 2024, seizure disorder, hypertension, recent Klebsiella UTI, CKD 3, chronic pleural effusion suspected malignant being admitted to the hospital with sepsis.  He was recently hospitalized at [facility] discharged home on 10/03/2023 after a stay for sepsis secondary to UTI.  States that he has been doing well, completed a course of oral antibiotics.  However yesterday, he started having a fever.  Denies any cough, shortness of breath, sick contacts, diarrhea.  On evaluation in the emergency department, he was tachycardic and febrile.  WBC 26.6, creatinine up to 2.6 from baseline of about 1.8.  Blood and urine cultures were obtained, he was given empiric IV antibiotics, and admitted to the hospitalist service this morning.  Currently he is resting comfortably, and has no complaints; tells me that he feels normal.  Review of Systems: Please see HPI for pertinent positives and negatives. A complete 10 system review of systems are otherwise negative.  Past Medical History:  Diagnosis Date   Arthritis    Atypical chest pain    cardiologist-- dr gerard;  chronic occasional,  pressure / discomfort, LOV 07/14/21 (as of 06/08/22)   Bladder calculus    BPH with obstruction/lower urinary tract symptoms    Follows with Dr. Valli Shank, urologist.   Complex partial seizure [facility])    followed by neurologist--- dr d. 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 Gretel at [facility] neurology, LOV w/ FNP on 03/21/22 in Epic ( as  of 06/08/22)   Generalized abdominal pain    w/  bloating due to UC  take doxepin    GERD (gastroesophageal reflux disease)    H/O urinary retention    History of bladder stone    History of hiatal hernia    History of kidney stones    History of prostate cancer 2010   urologist--- dr pace;   dx 2010,  Gleason 4+3, PSA 5.2;   03-04-2009  s/p radiactive prostate seed implants and external beam radiation   History of repair of hiatal hernia    02-11-2017  s/p lap paraesophageal hernia repair w/ nissen fundoplication   History of skin cancer    Hx of resection of meningioma 02/11/2009   benign right temperal lobe meningioma s/p resection  (residual hearing loss)   (neurosurgeon-- dr madilyn)   Hypertension    followed by cardiologist and pcp   Insomnia    Migraine with aura and without status migrainosus, not intractable    neurologist-- dr alm gretel   Prostate cancer [facility])    Simple renal cyst    bilateral ;   followed by urologist   Ulcerative colitis (HCC)    followed by dr v. shana (GI);  dx 2010;  current treatment stelara q8wks, LOV 06/07/22 (as of 06/08/22)   Wears glasses    Past Surgical History:  Procedure Laterality Date   BOTOX  INJECTION N/A 01/29/2023   Procedure: BOTOX  INJECTION 100 UNITS;  Surgeon: Shank Valli BIRCH, MD;  Location: [facility];  Service: Urology;  Laterality:  N/A;   BRAIN MENINGIOMA EXCISION  02/11/2009   @[facility] ;  craniotomy , right temporal medial sphonoid wing resection   CARPAL TUNNEL RELEASE Bilateral    right 09-05-2016  @[facility] ;   left 2013   COLONOSCOPY  12/2019   COLONOSCOPY  01/2022   CYSTOSCOPY  03/05/2012   Procedure: CYSTOSCOPY;  Surgeon: Donnice Gwenyth Brooks, MD;  Location: [facility];  Service: Urology;  Laterality: N/A;  Cysto Clot Evacuation and Fulgeration Of [place] ([place])   CYSTOSCOPY WITH INJECTION N/A 05/22/2023   Procedure: CYSTOSCOPY WITH INJECTION OF INDOCYANINE GREEN DYE;  Surgeon: Alvaro Ricardo KATHEE Mickey., MD;   Location: [facility];  Service: Urology;  Laterality: N/A;   CYSTOSCOPY WITH LITHOLAPAXY N/A 03/13/2019   Procedure: CYSTOSCOPY WITH lASER LITHOLAPAXY, DILATION OF STRICTURE;  Surgeon: Ottelin, Mark, MD;  Location: [facility];  Service: Urology;  Laterality: N/A;   CYSTOSCOPY WITH LITHOLAPAXY N/A 01/02/2022   Procedure: CYSTOSCOPY WITH LITHOTRIPSY;  Surgeon: Elisabeth Valli BIRCH, MD;  Location: [facility];  Service: Urology;  Laterality: N/A;   CYSTOSCOPY WITH RETROGRADE PYELOGRAM, URETEROSCOPY AND STENT PLACEMENT Right 03/13/2022   Procedure: CYSTOSCOPY WITH RETROGRADE PYELOGRAM, URETEROSCOPY AND STENT PLACEMENT;  Surgeon: Elisabeth Valli BIRCH, MD;  Location: [facility];  Service: Urology;  Laterality: Right;  75 MINS   CYSTOSCOPY WITH RETROGRADE PYELOGRAM, URETEROSCOPY AND STENT PLACEMENT Right 08/07/2022   Procedure: CYSTOSCOPY WITH RETROGRADE PYELOGRAM, URETEROSCOPY AND STENT PLACEMENT, REMOVAL OF NEPHROURETERAL STENT;  Surgeon: Elisabeth Valli BIRCH, MD;  Location: [facility];  Service: Urology;  Laterality: Right;  75 MINS   CYSTOSCOPY WITH URETHRAL DILATATION N/A 08/23/2020   Procedure: CYSTOSCOPY WITH URETHRAL DILATATION, BLADDER BIOPSY, FULGERATION;  Surgeon: Elisabeth Valli BIRCH, MD;  Location: [facility];  Service: Urology;  Laterality: N/A;  1 HR   CYSTOSCOPY WITH URETHRAL DILATATION N/A 03/13/2021   Procedure: CYSTOSCOPY WITH URETHRAL BALLOON DILATATION/ FOLEY CATHETER PLACEMENT;  Surgeon: Elisabeth Valli BIRCH, MD;  [facility];  Service: Urology;  Laterality: N/A;   CYSTOSCOPY WITH URETHRAL DILATATION Left 07/08/2009   @[facility] :   with left  RTG   CYSTOSCOPY WITH URETHRAL DILATATION N/A 06/19/2022   Procedure: CYSTOSCOPY WITH OPTILUME URETHRAL DILATATION;  Surgeon: Elisabeth Valli BIRCH, MD;  Location: [facility];  Service: Urology;  Laterality: N/A;   CYSTOSCOPY/URETEROSCOPY/HOLMIUM LASER/STENT PLACEMENT Bilateral 01/29/2023    Procedure: DIAGNOSTIC CYSTOSCOPY , LEFT RETROGRADE PYELOGRAM, LEFT URETERAL STENT PLACEMENT, AND STONE BASKETING;  Surgeon: Elisabeth Valli BIRCH, MD;  Location: [facility];  Service: Urology;  Laterality: Bilateral;   HOLMIUM LASER APPLICATION N/A 08/23/2020   Procedure: HOLMIUM LASER APPLICATION OF URETHRAL STONE;  Surgeon: Elisabeth Valli BIRCH, MD;  Location: [facility];  Service: Urology;  Laterality: N/A;   HOLMIUM LASER APPLICATION Right 03/13/2022   Procedure: HOLMIUM LASER APPLICATION;  Surgeon: Elisabeth Valli BIRCH, MD;  Location: [facility];  Service: Urology;  Laterality: Right;   HOLMIUM LASER APPLICATION Right 08/07/2022   Procedure: HOLMIUM LASER APPLICATION;  Surgeon: Elisabeth Valli BIRCH, MD;  [facility];  Service: Urology;  Laterality: Right;   INGUINAL HERNIA REPAIR Right 05/03/2015   @[facility]    INSERTION PROSTATE RADIATION SEED  03/04/2009   @[facility]    IR NEPHROSTOMY PLACEMENT RIGHT  07/18/2022   IR URETERAL STENT RIGHT NEW ACCESS W/O SEP NEPHROSTOMY CATH  02/04/2023   KNEE CARTILAGE SURGERY Left    1980S   LAPAROSCOPIC PARAESOPHAGEAL HERNIA REPAIR  02/11/2017   @[facility] ;  W/  NISSEN  FUNDOPLICATION (MESH)   LYMPH NODE DISSECTION Bilateral 05/22/2023   Procedure: LYMPH NODE DISSECTION;  Surgeon: Alvaro Ricardo KATHEE Mickey., MD;  [facility];  Service: Urology;  Laterality: Bilateral;   NEPHROLITHOTOMY Right 03/29/2023   Procedure: NEPHROLITHOTOMY PERCUTANEOUS;  Surgeon: Alvaro Ricardo KATHEE Mickey., MD;  Location[facility];  Service: Urology;  Laterality: Right;  2 HRS   PROSTATE BIOPSY     ROBOT ASSISTED LAPAROSCOPIC COMPLETE CYSTECT ILEAL CONDUIT N/A 05/22/2023   Procedure: XI ROBOTIC ASSISTED LAPAROSCOPIC COMPLETE CYSTECT ILEAL CONDUIT;  Surgeon: Alvaro Ricardo KATHEE Mickey., MD;  [facility];  Service: Urology;  Laterality: N/A;   ROBOT ASSISTED LAPAROSCOPIC RADICAL PROSTATECTOMY N/A 05/22/2023   Procedure: XI ROBOTIC ASSISTED LAPAROSCOPIC RADICAL  PROSTATECTOMY;  Surgeon: Alvaro Ricardo KATHEE Mickey., MD;  Location[facility];  Service: Urology;  Laterality: N/A;   TOE SURGERY     TRANSURETHRAL RESECTION OF BLADDER TUMOR N/A 01/02/2022   Procedure: TRANSURETHRAL RESECTION OF BLADDER TUMOR (TURBT);  Surgeon: Elisabeth Valli BIRCH, MD;  Location: [facility];  Service: Urology;  Laterality: N/A;   TRANSURETHRAL RESECTION OF BLADDER TUMOR N/A 01/29/2023   Procedure: TRANSURETHRAL RESECTION OF BLADDER TUMOR (TURBT);  Surgeon: Elisabeth Valli BIRCH, MD;  Location: [facility];  Service: Urology;  Laterality: N/A;   Social History:  reports that he quit smoking about 36 years ago. His smoking use included cigarettes. He started smoking about 46 years ago. He has a 10 pack-year smoking history. He has never used smokeless tobacco. He reports current alcohol use. He reports that he does not use drugs.  Allergies  Allergen Reactions   Sulfa Antibiotics Rash    Childhood   No family history on file.   Prior to Admission medications   Medication Sig Start Date End Date Taking? Authorizing Provider  amLODipine  (NORVASC ) 5 MG tablet Take 5 mg by mouth at bedtime.    [provider]  BREO ELLIPTA  100-25 MCG/ACT AEPB INHALE 1 PUFF BY MOUTH EVERY DAY 06/25/23   Olalere, Adewale A, MD  celecoxib (CELEBREX) 100 MG capsule Take 100 mg by mouth every evening.    [provider]  doxepin  (SINEQUAN ) 25 MG capsule Take 25 mg by mouth at bedtime.    [provider]  ferrous sulfate  325 (65 FE) MG tablet Take 325 mg by mouth every other day.    [provider]  finasteride  (PROSCAR ) 5 MG tablet Take 5 mg by mouth daily at 12 noon. 02/05/13   [provider]  ketoconazole (NIZORAL) 2 % cream Apply 1 Application topically daily as needed for irritation.    [provider]  ketoconazole (NIZORAL) 2 % shampoo Apply 1 Application topically daily as needed (yeast).    [provider]   levETIRAcetam  (KEPPRA ) 500 MG tablet Take 500 mg by mouth 3 (three) times daily.    [provider]  linaclotide  (LINZESS ) 145 MCG CAPS capsule Take 145 mcg by mouth daily. 09/26/23 03/24/24  [provider]  Meclizine  HCl 25 MG CHEW Chew 1 tablet by mouth every 6 (six) hours as needed (dizziness).    [provider]  mirtazapine  (REMERON ) 15 MG tablet Take 1 tablet (15 mg total) by mouth at bedtime. Patient not taking: Reported on 09/30/2023 09/23/23   Neda Jennet LABOR, MD  OXcarbazepine  ER (OXTELLAR XR ) 600 MG TB24 Take 600 mg by mouth daily in the afternoon.    [provider]  polyethylene glycol (MIRALAX  / GLYCOLAX ) 17 g packet Take 17  g by mouth 2 (two) times daily. Patient taking differently: Take 17 g by mouth daily as needed for moderate constipation. 07/25/23   Cheryle Page, MD  primidone  (MYSOLINE ) 50 MG tablet Take 100 mg by mouth at bedtime.    [provider]  ustekinumab (STELARA) 90 MG/ML SOSY injection Inject 90 mg into the skin every 8 (eight) weeks.    [provider]  vitamin B-12 (CYANOCOBALAMIN) 500 MCG tablet Take 500 mcg by mouth daily.    [provider]    Physical Exam: BP (!) 140/83   Pulse 89   Temp (!) 97.5 F (36.4 C)   Resp 13   Ht 5' 4 (1.626 m)   Wt 49.7 kg   SpO2 100%   BMI 18.81 kg/m  General:  Alert, oriented, calm, in no acute distress, thin chronically ill-appearing gentleman resting comfortably on 2 L nasal cannula oxygen.  He looks comfortable and nontoxic, though intravascularly depleted Eyes: EOMI, clear conjuctivae, white sclerea Neck: supple, no masses, trachea mildline  Cardiovascular: RRR, no murmurs or rubs, no peripheral edema  Respiratory: clear to auscultation bilaterally, no wheezes, no crackles  Abdomen: soft, nontender, nondistended, normal bowel tones heard  Skin: dry, no rashes  Musculoskeletal: no joint effusions, normal range of motion  Psychiatric:  appropriate affect, normal speech  Neurologic: extraocular muscles intact, clear speech, moving all extremities with intact sensorium         Labs on Admission:  Basic Metabolic Panel: Recent Labs  Lab 10/14/23 2237  NA 135  K 3.9  CL 109  CO2 15*  GLUCOSE 123*  BUN 60*  CREATININE 2.58*  CALCIUM 10.1   Liver Function Tests: Recent Labs  Lab 10/14/23 2237  AST 24  ALT 17  ALKPHOS 157*  BILITOT 0.6  PROT 7.0  ALBUMIN 2.8*   No results for input(s): LIPASE, AMYLASE in the last 168 hours. No results for input(s): AMMONIA in the last 168 hours. CBC: Recent Labs  Lab 10/14/23 2237  WBC 26.6*  NEUTROABS 24.8*  HGB 10.3*  HCT 31.4*  MCV 94.9  PLT 412*   Cardiac Enzymes: No results for input(s): CKTOTAL, CKMB, CKMBINDEX, TROPONINI in the last 168 hours. BNP (last 3 results) No results for input(s): BNP in the last 8760 hours.  ProBNP (last 3 results) No results for input(s): PROBNP in the last 8760 hours.  CBG: No results for input(s): GLUCAP in the last 168 hours.  Radiological Exams on Admission: CT CHEST ABDOMEN PELVIS WO CONTRAST Result Date: 10/15/2023 CLINICAL DATA:  Set CIS, elevated white count. EXAM: CT CHEST, ABDOMEN AND PELVIS WITHOUT CONTRAST TECHNIQUE: Multidetector CT imaging of the chest, abdomen and pelvis was performed following the standard protocol without IV contrast. RADIATION DOSE REDUCTION: This exam was performed according to the departmental dose-optimization program which includes automated exposure control, adjustment of the mA and/or kV according to patient size and/or use of iterative reconstruction technique. COMPARISON:  CT abdomen and pelvis 09/30/2023.  Chest CT 07/19/2023 FINDINGS: CT CHEST FINDINGS Cardiovascular: Heart is normal size. Aorta is normal caliber. Diffuse 3 vessel coronary artery disease. Scattered aortic atherosclerosis. Mediastinum/Nodes: No mediastinal, hilar, or axillary adenopathy. Trachea and  esophagus are unremarkable. Thyroid  unremarkable. Lungs/Pleura: Loculated moderate right pleural effusion, increasing since prior chest CT. Airspace opacity in the right lower lobe and right middle lobe could reflect atelectasis or pneumonia. Predominantly linear opacities in the lingula and left lower lobe could reflect scarring or atelectasis. Subpleural nodularity noted along the left fissure and  posteriorly. Left upper lobe nodule on image 37 measures 8 mm, stable since prior study. Left upper lobe nodule on image 36 measures 4 mm, new since prior study. Musculoskeletal: Chest wall soft tissues are unremarkable. No acute bony abnormality. CT ABDOMEN PELVIS FINDINGS Hepatobiliary: 1.9 cm low-density lesion in the right hepatic lobe. 1.8 cm previously, difficult to characterize on this noncontrast study. Gallbladder unremarkable. No biliary ductal dilatation. Pancreas: No focal abnormality or ductal dilatation. Spleen: No focal abnormality.  Normal size. Adrenals/Urinary Tract: Adrenal glands unremarkable. Persistent bilateral hydronephrosis, unchanged since prior study. Nonobstructing stones in the lower pole of the right kidney. Right kidney is atrophic. Multiple bilateral renal cysts are unchanged. Prior cystectomy with right lower quadrant urostomy. Ureters are difficult to follow without contrast. Stomach/Bowel: Moderate to large stool burden throughout the colon, similar to prior study. No bowel obstruction. Vascular/Lymphatic: Aortoiliac atherosclerosis. No evidence of aneurysm or adenopathy. 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 Other: No free fluid or free air. Musculoskeletal: Again noted is the lytic area within the left sacral ala and adjacent left iliac bone, unchanged since recent study. Degenerative disc and facet disease in the lumbar spine. IMPRESSION: Enlarging chronic loculated right pleural effusion and increasing right lower lobe airspace disease. This could reflect  atelectasis or pneumonia. Nodularity noted along the left fissure and subpleural locations, increasing since prior study. Cannot exclude metastases. New 4 mm left upper lobe pulmonary nodule. Recommend attention on follow-up imaging. Diffuse coronary artery disease.  Aortic atherosclerosis. 1.9 cm low-density lesion in the right hepatic lobe, difficult to characterize on this noncontrast study. Prior cystectomy with right lower quadrant urostomy. Stable chronic bilateral hydronephrosis. Moderate to large stool burden throughout the colon. Stable lytic areas within the left sacrum and adjacent left iliac bone. Electronically Signed   By: Franky Crease M.D.   On: 10/15/2023 01:40   DG Chest Port 1 View Result Date: 10/14/2023 CLINICAL DATA:  Weakness EXAM: PORTABLE CHEST 1 VIEW COMPARISON:  09/30/2023 FINDINGS: Cardiac shadow is within normal limits. Increased density is noted over the right hemithorax which is new from the prior exam. The appearance suggests a loculated effusion. CT of the chest may be helpful for further evaluation. Left lung is clear. No acute bony abnormality is noted IMPRESSION: Increased density over the right hemithorax suggesting a loculated effusion. CT of the chest may be helpful for further evaluation. Electronically Signed   By: Oneil Devonshire M.D.   On: 10/14/2023 22:52   Assessment/Plan Wesley [name] is a 73 y.o. male with medical history significant for presumed metastatic bladder cancer status post cystectomy and ileal conduit August 2024, seizure disorder, hypertension, recent Klebsiella UTI, chronic pleural effusion suspected malignant being admitted to the hospital with sepsis.  Severe sepsis-meeting criteria with fever, tachycardia, endorgan dysfunction with AKI.  Acute infection is suspected, though source is currently unclear.  Could be related to HCAP, recurrent UTI, or COVID infection.  Currently patient is hemodynamically stable, with normal lactate. -Inpatient  admission to progressive unit -Follow-up blood cultures -Follow-up urine cultures -Empiric IV vancomycin  and IV Zosyn  -Check MRSA swab, can discontinue IV vancomycin  if negative  Acute kidney injury-baseline creatinine about 1.6-1.8, likely due to sepsis.  Patient received 1 L LR bolus in the emergency department, as well as LR infusion for the last several hours. -Avoid nephrotoxins -Encourage p.o. fluid intake -Monitor renal function with morning labs, will discontinue maintenance IV fluids in the setting of fluid shortage  COVID-positive-without cough or other respiratory symptoms,  but could be source of his fever.  No indication for COVID-specific therapy, continue supportive care. -Contact and droplet precautions  Right-sided loculated pleural effusion-with evidence of enlargement when compared to prior imaging.  Atelectasis versus consolidation, in the setting of possible metastatic disease. -Empiric IV vancomycin  and IV Zosyn  -Discussed with pulmonology, who will consult regarding management of his loculated effusion.  Query whether would benefit from diagnostic thoracentesis.  Abnormal urinalysis-in the setting of ileal conduit and recent Klebsiella UTI.  Empiric IV antibiotics as above, follow-up urine culture.  Seizure disorder-continue Keppra , oxcarbazepine   Chronic tremor-primidone  at bedtime  Hypertension-continue home amlodipine   DVT prophylaxis: Subcutaneous heparin     Code Status: Full Code  Consults called: PCCM  Admission status: The appropriate patient status for this patient is INPATIENT. Inpatient status is judged to be reasonable and necessary in order to provide the required intensity of service to ensure the patient's safety. The patient's presenting symptoms, physical exam findings, and initial radiographic and laboratory data in the context of their chronic comorbidities is felt to place them at high risk for further clinical deterioration. Furthermore, it is  not anticipated that the patient will be medically stable for discharge from the hospital within 2 midnights of admission.    I certify that at the point of admission it is my clinical judgment that the patient will require inpatient hospital care spanning beyond 2 midnights from the point of admission due to high intensity of service, high risk for further deterioration and high frequency of surveillance required  Time spent: 59 minutes  Ringo Sherod CHRISTELLA Gail MD [facility] Pager [phone]  If 7PM-7AM, please contact night-coverage [email or web address] Password [email or web address]  10/15/2023, 8:59 AM

## 2023-10-15 NOTE — Procedures (Signed)
 Insertion of Chest Tube Procedure Note  SURAJ RAMDASS  [id number]  August 08, 1951  Date:10/15/23  Time:1:46 PM    Provider Performing: Jeralyn FORBES Banner   Procedure: Chest Tube Insertion [phone])  Indication(s) Effusion  Consent Risks of the procedure as well as the alternatives and risks of each were explained to the patient and/or caregiver.  Consent for the procedure was obtained and is signed in the bedside chart  Anesthesia Topical only with 1% lidocaine     Time Out Verified patient identification, verified procedure, site/side was marked, verified correct patient position, special equipment/implants available, medications/allergies/relevant history reviewed, required imaging and test results available.   Sterile Technique Maximal sterile technique including full sterile barrier drape, hand hygiene, sterile gown, sterile gloves, mask, hair covering, sterile ultrasound probe cover (if used).   Procedure Description Ultrasound used to identify appropriate pleural anatomy for placement and overlying skin marked. Area of placement cleaned and draped in sterile fashion.  A 14 French pigtail pleural catheter was placed into the right pleural space using Seldinger technique. Appropriate return of fluid was obtained.  The tube was connected to atrium and placed on -20 cm H2O wall suction.   Complications/Tolerance None; patient tolerated the procedure well. Chest X-ray is ordered to verify placement.   EBL Minimal  Specimen(s) none

## 2023-10-15 NOTE — Progress Notes (Signed)
 Post chest tube placement chest x-ray evaluated. The chest tube is in satisfactory position, there is improved aeration, and decreased pleural effusion, however there is some residual approximately 15% pneumothorax.  As there was not significant air leak I suspect this represents trapped lung Plan Continue chest tube to suction A.m. chest x-ray

## 2023-10-15 NOTE — Consult Note (Signed)
 NAME:  KYLIL SWOPES, MRN:  [id number], DOB:  [birth year], LOS: 0 ADMISSION DATE:  10/14/2023, CONSULTATION DATE:  1/7 REFERRING MD:  Zella, CHIEF COMPLAINT:  recurrent loculated pleural effusion    History of Present Illness:  This is a 73 year old male w/ sig h/o squamous cell carcinoma of scalp, recurrent hydronephrosis now s/p cystoprostatectomy w/ placement of ileal conduit (aug 2024) for bladder cancer. Has been hospitalized several times for recurrent urinary tract sepsis. Also followed by Dr Neda w/ our pulm group initially for loculated right pleural effusion first noted in Oct s/p urinary procedure felt symptomatic and resolved. Also noted to have Rounded density in RLL as well as RLL nodules which have been followed in the out pt setting. His most recent f/U CT of abd/pelvis obtained during admission (dec 23) for one of his recurrent sepsis admits showed small right effusion and on-going right lower lobe rounded density as well as increase in size of RLL nodule.  He was discharged 12/26 completed abx Presented back to ER 1/6 w/ cc: fever generalized weakness  x 1d In ER wbc ct 26.6, tachycardic, scr up to 2.6. On further questioning reports some increased shortness of breath since dc last.   Cultures sent. ABX started. CXR obtained showed increased density over right hemithorax raising concern for loculated effusion  A CT chest/abd/pelvis obtained showed RLL airspace disease w/ air bronchograms. LLL opacity. Mod sized loculated right effusion. The previoulsy noted rounded right LL density and new nodularity along the left subpleural location. Also showed. 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 S/p robotic cystoprostatectomy w/ placement of ileal conduit (aug 2024) Prior loculated right pleural effusion. First seen back in Jan 2024 after urological procedure (felt sympathetic and resolved) Seen as hosp follow up w/ Dr Neda 9/19 ordered CT chest which showed some nodular pleural thickening and spiculated RLL lung nodule. Minimal effusion Hospitalized Oct 2024 for UT sepsis and hydroureteronephrosis + Klebsiella treated w/ ABX followed by urology at dc  During this time CT obtained for recurrent pleuritic CP showed very small layering right effusion that appeared loculated.  Last seen by Pulm 12/16 w/ plan for repeat CT scan but admitted again 12/23 for Sepsis UT source. CT abd showed small right effusion and on-going right lower lobe rounded density as well as increase in size of RLL nodule Seizure disorder: Oxtellar XR  600 mg daily and Keppra  500 mg three times a day  Essential tremors  UC Prior smoker Vertigo w/ falls at home  CKD stage 4 Significant Hospital Events: Including procedures, antibiotic start and stop dates in addition to other pertinent events   1/6 admitted w/ sepsis felt UT source vs HCAP w/ complicated right pleural effusion. COVID positive. Blood cultures pending. Zosyn  started  1/7 CT obtained. Marked increase in right loculated effusion. RLL PNA vs atx stable RLL density and new nodular left pleural based changes. PCCM asked to eval.   Interim History / Subjective:  Resting in bed no distress  Objective   Blood pressure (!) 140/83, pulse 89, temperature (!) 97.5 F (36.4 C), resp. rate 13, height 5' 4 (1.626 m), weight 49.7 kg,  SpO2 100%.        Intake/Output Summary (Last 24 hours) at 10/15/2023 1051 Last data filed at 10/15/2023 0300 Gross per 24 hour  Intake 999 ml  Output --  Net 999 ml   Filed Weights   10/14/23 2233  Weight: 49.7 kg     Examination: General: frail 73 year old male resting in bed no distress HENT: Normocephalic atraumatic does have some temporal wasting his mucous membranes are moist Lungs: Crackles left base diminished right base point-of-care ultrasound showing loculated pleural fluid collection on the right side Cardiovascular: Regular rate and rhythm Abdomen: Soft nontender Extremities: Warm and dry  Neuro: oriented GU: Voiding spontaneously  Resolved Hospital Problem list     Assessment & Plan:  Severe sepsis  RLL PNA Complicated right pleural effusion RLL lung density and new Left pleural based nodular density worrisome for malignancy vs mets Metastatic Bladder cancer s/p cystoprostatectomy w/ placement of ileal conduit (aug 2024)  Chronic bilateral hydronephrosis  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  Now presents with fever, right lower lobe infiltrate, and significant increase in loculated fluid collection.  Although certainly this could be representative of his metastasis, the fluid has changed acutely in the context of pneumonia Plan Continue current antibiotics as initiated by medical team We will place a small bore ultrasound-guided chest tube, and placed to suction Will send pleural fluid for infectious and malignancy analysis Repeat a.m. chest x-ray, there are frequent loculated growing we may consider pleural lytics, however there may be a slightly increased risk of bleeding if indeed the right lower lobe consolidative process is indeed cancer As needed analgesia If the pleural fluid turns out to be malignant  cells we will monitor with current tube in place until output less than 50 cc in 24 hours then discontinue.  Will then have to determine whether or not palliative Pleurx would be needed    Best Practice (right click and Reselect all SmartList Selections daily)  Per primary   Labs   CBC: Recent Labs  Lab 10/14/23 2237  WBC 26.6*  NEUTROABS 24.8*  HGB 10.3*  HCT 31.4*  MCV 94.9  PLT 412*    Basic Metabolic Panel: Recent Labs  Lab 10/14/23 2237  NA 135  K 3.9  CL 109  CO2 15*  GLUCOSE 123*  BUN 60*  CREATININE 2.58*  CALCIUM 10.1   GFR: Estimated Creatinine Clearance: 17.7 mL/min (A) (by C-G formula based on SCr of 2.58 mg/dL (H)). Recent Labs  Lab 10/14/23 2237 10/14/23 2250  WBC 26.6*  --   LATICACIDVEN  --  1.2    Liver Function Tests: Recent Labs  Lab 10/14/23 2237  AST 24  ALT 17  ALKPHOS 157*  BILITOT 0.6  PROT 7.0  ALBUMIN 2.8*   No results for input(s): LIPASE, AMYLASE in the last 168 hours. No results for input(s): AMMONIA in the last 168 hours.  ABG    Component Value Date/Time   HCO3 17.9 (L) 10/14/2023 2300   TCO2 26 01/29/2023 0815   ACIDBASEDEF 6.9 (H) 10/14/2023 2300   O2SAT 48.7 10/14/2023 2300  Coagulation Profile: Recent Labs  Lab 10/14/23 2237  INR 1.2    Cardiac Enzymes: No results for input(s): CKTOTAL, CKMB, CKMBINDEX, TROPONINI in the last 168 hours.  HbA1C: No results found for: HGBA1C  CBG: No results for input(s): GLUCAP in the last 168 hours.  Review of Systems:   Review of Systems  Constitutional:  Positive for fever and malaise/fatigue.  HENT: Negative.    Eyes: Negative.   Respiratory:  Positive for shortness of breath.   Cardiovascular: Negative.   Gastrointestinal: Negative.   Genitourinary: Negative.   Musculoskeletal:  Positive for myalgias.  Skin: Negative.   Neurological: Negative.   Endo/Heme/Allergies: Negative.   Psychiatric/Behavioral: Negative.      Past  Medical History:  He,  has a past medical history of Arthritis, Atypical chest pain, Bladder calculus, BPH with obstruction/lower urinary tract symptoms, Complex partial seizure (HCC), Dyslipidemia, Essential tremor, Generalized abdominal pain, GERD (gastroesophageal reflux disease), H/O urinary retention, History of bladder stone, History of hiatal hernia, History of kidney stones, History of prostate cancer (2010), History of repair of hiatal hernia, History of skin cancer, resection of meningioma (02/11/2009), Hypertension, Insomnia, Migraine with aura and without status migrainosus, not intractable, Prostate cancer (HCC), Simple renal cyst, Ulcerative colitis (HCC), and Wears glasses.   Surgical History:   Past Surgical History:  Procedure Laterality Date   BOTOX  INJECTION N/A 01/29/2023   Procedure: BOTOX  INJECTION 100 UNITS;  Surgeon: Elisabeth Valli BIRCH, MD;  Location: [facility];  Service: Urology;  Laterality: N/A;   BRAIN MENINGIOMA EXCISION  02/11/2009   @[facility] ;  craniotomy , right temporal medial sphonoid wing resection   CARPAL TUNNEL RELEASE Bilateral    right 09-05-2016  @[facility] ;   left 2013   COLONOSCOPY  12/2019   COLONOSCOPY  01/2022   CYSTOSCOPY  03/05/2012   Procedure: CYSTOSCOPY;  Surgeon: Donnice Gwenyth Brooks, MD;  Location: [facility];  Service: Urology;  Laterality: N/A;  Cysto Clot Evacuation and Fulgeration Of [place] ([place])   CYSTOSCOPY WITH INJECTION N/A 05/22/2023   Procedure: CYSTOSCOPY WITH INJECTION OF INDOCYANINE GREEN DYE;  Surgeon: Alvaro Ricardo KATHEE Mickey., MD;  Location: [facility];  Service: Urology;  Laterality: N/A;   CYSTOSCOPY WITH LITHOLAPAXY N/A 03/13/2019   Procedure: CYSTOSCOPY WITH lASER LITHOLAPAXY, DILATION OF STRICTURE;  Surgeon: Ottelin, Mark, MD;  Location: [facility] [place];  Service: Urology;  Laterality: N/A;   CYSTOSCOPY WITH LITHOLAPAXY N/A 01/02/2022   Procedure: CYSTOSCOPY WITH LITHOTRIPSY;  Surgeon: Elisabeth Valli BIRCH, MD;   Location: [facility];  Service: Urology;  Laterality: N/A;   CYSTOSCOPY WITH RETROGRADE PYELOGRAM, URETEROSCOPY AND STENT PLACEMENT Right 03/13/2022   Procedure: CYSTOSCOPY WITH RETROGRADE PYELOGRAM, URETEROSCOPY AND STENT PLACEMENT;  Surgeon: Elisabeth Valli BIRCH, MD;  Location: [facility];  Service: Urology;  Laterality: Right;  75 MINS   CYSTOSCOPY WITH RETROGRADE PYELOGRAM, URETEROSCOPY AND STENT PLACEMENT Right 08/07/2022   Procedure: CYSTOSCOPY WITH RETROGRADE PYELOGRAM, URETEROSCOPY AND STENT PLACEMENT, REMOVAL OF NEPHROURETERAL STENT;  Surgeon: Elisabeth Valli BIRCH, MD;  Location: [facility];  Service: Urology;  Laterality: Right;  75 MINS   CYSTOSCOPY WITH URETHRAL DILATATION N/A 08/23/2020   Procedure: CYSTOSCOPY WITH URETHRAL DILATATION, BLADDER BIOPSY, FULGERATION;  Surgeon: Elisabeth Valli BIRCH, MD;  Location: [facility];  Service: Urology;  Laterality: N/A;  1 HR   CYSTOSCOPY WITH URETHRAL DILATATION N/A 03/13/2021   Procedure: CYSTOSCOPY WITH URETHRAL BALLOON DILATATION/ FOLEY CATHETER PLACEMENT;  Surgeon: Elisabeth Valli BIRCH, MD;  Location: [facility];  Service: Urology;  Laterality: N/A;   CYSTOSCOPY WITH URETHRAL DILATATION Left 07/08/2009   @[facility] :   with left  RTG   CYSTOSCOPY WITH URETHRAL DILATATION N/A 06/19/2022   Procedure: CYSTOSCOPY WITH OPTILUME URETHRAL DILATATION;  Surgeon: Elisabeth Valli BIRCH, MD;  Location: [facility];  Service: Urology;  Laterality: N/A;   CYSTOSCOPY/URETEROSCOPY/HOLMIUM LASER/STENT PLACEMENT Bilateral 01/29/2023   Procedure: DIAGNOSTIC CYSTOSCOPY , LEFT RETROGRADE PYELOGRAM, LEFT URETERAL STENT PLACEMENT, AND STONE BASKETING;  Surgeon: Elisabeth Valli BIRCH, MD;  Location: [facility];  Service: Urology;  Laterality: Bilateral;   HOLMIUM LASER APPLICATION N/A 08/23/2020   Procedure: HOLMIUM LASER APPLICATION OF URETHRAL STONE;  Surgeon: Elisabeth Valli BIRCH, MD;  Location: [facility];   Service: Urology;  Laterality: N/A;   HOLMIUM LASER APPLICATION Right 03/13/2022   Procedure: HOLMIUM LASER APPLICATION;  Surgeon: Elisabeth Valli BIRCH, MD;  Location: [facility];  Service: Urology;  Laterality: Right;   HOLMIUM LASER APPLICATION Right 08/07/2022   Procedure: HOLMIUM LASER APPLICATION;  Surgeon: Elisabeth Valli BIRCH, MD;  Location: [facility];  Service: Urology;  Laterality: Right;   INGUINAL HERNIA REPAIR Right 05/03/2015   @[facility]    INSERTION PROSTATE RADIATION SEED  03/04/2009   @[facility]    IR NEPHROSTOMY PLACEMENT RIGHT  07/18/2022   IR URETERAL STENT RIGHT NEW ACCESS W/O SEP NEPHROSTOMY CATH  02/04/2023   KNEE CARTILAGE SURGERY Left    1980S   LAPAROSCOPIC PARAESOPHAGEAL HERNIA REPAIR  02/11/2017   @[facility] ;  W/  NISSEN FUNDOPLICATION (MESH)   LYMPH NODE DISSECTION Bilateral 05/22/2023   Procedure: LYMPH NODE DISSECTION;  Surgeon: Alvaro Ricardo KATHEE Mickey., MD;  Location: [facility];  Service: Urology;  Laterality: Bilateral;   NEPHROLITHOTOMY Right 03/29/2023   Procedure: NEPHROLITHOTOMY PERCUTANEOUS;  Surgeon: Alvaro Ricardo KATHEE Mickey., MD;  Location: [facility];  Service: Urology;  Laterality: Right;  2 HRS   PROSTATE BIOPSY     ROBOT ASSISTED LAPAROSCOPIC COMPLETE CYSTECT ILEAL CONDUIT N/A 05/22/2023   Procedure: XI ROBOTIC ASSISTED LAPAROSCOPIC COMPLETE CYSTECT ILEAL CONDUIT;  Surgeon: Alvaro Ricardo KATHEE Mickey., MD;  Location: [facility];  Service: Urology;  Laterality: N/A;   ROBOT ASSISTED LAPAROSCOPIC RADICAL PROSTATECTOMY N/A 05/22/2023   Procedure: XI ROBOTIC ASSISTED LAPAROSCOPIC RADICAL PROSTATECTOMY;  Surgeon: Alvaro Ricardo KATHEE Mickey., MD;  Location: [facility];  Service: Urology;  Laterality: N/A;   TOE SURGERY     TRANSURETHRAL RESECTION OF BLADDER TUMOR N/A 01/02/2022   Procedure: TRANSURETHRAL RESECTION OF BLADDER TUMOR (TURBT);  Surgeon: Elisabeth Valli BIRCH, MD;  Location: [facility];  Service: Urology;  Laterality: N/A;   TRANSURETHRAL RESECTION OF BLADDER TUMOR N/A  01/29/2023   Procedure: TRANSURETHRAL RESECTION OF BLADDER TUMOR (TURBT);  Surgeon: Elisabeth Valli BIRCH, MD;  Location: [facility];  Service: Urology;  Laterality: N/A;     Social History:   reports that he quit smoking about 36 years ago. His smoking use included cigarettes. He started smoking about 46 years ago. He has a 10 pack-year smoking history. He has never used smokeless tobacco. He reports current alcohol use. He reports that he does not use drugs.   Family History:  His family history is not on file.   Allergies Allergies  Allergen Reactions   Sulfa Antibiotics Rash    Childhood     Home Medications  Prior to Admission medications   Medication Sig Start Date End Date Taking? Authorizing Provider  amLODipine  (NORVASC ) 5 MG tablet Take 5 mg by mouth at bedtime.  [provider]  BREO ELLIPTA  100-25 MCG/ACT AEPB INHALE 1 PUFF BY MOUTH EVERY DAY 06/25/23   Olalere, Adewale A, MD  celecoxib (CELEBREX) 100 MG capsule Take 100 mg by mouth every evening.    [provider]  doxepin  (SINEQUAN ) 25 MG capsule Take 25 mg by mouth at bedtime.    [provider]  ferrous sulfate  325 (65 FE) MG tablet Take 325 mg by mouth every other day.    [provider]  finasteride  (PROSCAR ) 5 MG tablet Take 5 mg by mouth daily at 12 noon. 02/05/13   [provider]  ketoconazole (NIZORAL) 2 % cream Apply 1 Application topically daily as needed for irritation.    [provider]  ketoconazole (NIZORAL) 2 % shampoo Apply 1 Application topically daily as needed (yeast).    [provider]  levETIRAcetam  (KEPPRA ) 500 MG tablet Take 500 mg by mouth 3 (three) times daily.    [provider]  linaclotide  (LINZESS ) 145 MCG CAPS capsule Take 145 mcg by mouth daily. 09/26/23 03/24/24  [provider]  Meclizine  HCl 25 MG CHEW Chew 1 tablet by mouth every 6 (six) hours as needed (dizziness).    [provider]   mirtazapine  (REMERON ) 15 MG tablet Take 1 tablet (15 mg total) by mouth at bedtime. Patient not taking: Reported on 09/30/2023 09/23/23   Neda Jennet LABOR, MD  OXcarbazepine  ER (OXTELLAR XR ) 600 MG TB24 Take 600 mg by mouth daily in the afternoon.    [provider]  polyethylene glycol (MIRALAX  / GLYCOLAX ) 17 g packet Take 17 g by mouth 2 (two) times daily. Patient taking differently: Take 17 g by mouth daily as needed for moderate constipation. 07/25/23   Cheryle Page, MD  primidone  (MYSOLINE ) 50 MG tablet Take 100 mg by mouth at bedtime.    [provider]  ustekinumab (STELARA) 90 MG/ML SOSY injection Inject 90 mg into the skin every 8 (eight) weeks.    [provider]  vitamin B-12 (CYANOCOBALAMIN) 500 MCG tablet Take 500 mcg by mouth daily.    [provider]     Critical care time: NA

## 2023-10-15 NOTE — Progress Notes (Signed)
 Pharmacy Antibiotic Note  Wesley [name] is a 73 y.o. male admitted on 10/14/2023 with  HCAP .  Pharmacy has been consulted for Zosyn  dosing.  PMH includes past medical history of bladder cancer status post cystectomy and ileal conduit.     Plan: Zosyn  3.375 gr IV x1 then 2.25 gr IV q8h   Height: 5' 4 (162.6 cm) Weight: 49.7 kg (109 lb 9.6 oz) IBW/kg (Calculated) : 59.2  Temp (24hrs), Avg:98.8 F (37.1 C), Min:97.5 F (36.4 C), Max:101.9 F (38.8 C)  Recent Labs  Lab 10/14/23 2237 10/14/23 2250  WBC 26.6*  --   CREATININE 2.58*  --   LATICACIDVEN  --  1.2    Estimated Creatinine Clearance: 17.7 mL/min (A) (by C-G formula based on SCr of 2.58 mg/dL (H)).    Allergies  Allergen Reactions   Sulfa Antibiotics Rash    Childhood    Antimicrobials this admission: 1/6 cefepime  x1  1/7 Zosyn  >>   Dose adjustments this admission:   Microbiology results: 1/6 BCx:  1/6 UCx:       Dolphus Roller, PharmD, BCPS 10/15/2023 9:12 AM

## 2023-10-15 NOTE — Plan of Care (Signed)
  Problem: Education: Goal: Knowledge of risk factors and measures for prevention of condition will improve Outcome: Progressing   Problem: Coping: Goal: Psychosocial and spiritual needs will be supported Outcome: Progressing   Problem: Respiratory: Goal: Will maintain a patent airway Outcome: Progressing Goal: Complications related to the disease process, condition or treatment will be avoided or minimized Outcome: Progressing   Problem: Education: Goal: Knowledge of General Education information will improve Description: Including pain rating scale, medication(s)/side effects and non-pharmacologic comfort measures Outcome: Progressing   Problem: Clinical Measurements: Goal: Ability to maintain clinical measurements within normal limits will improve Outcome: Progressing Goal: Will remain free from infection Outcome: Progressing Goal: Diagnostic test results will improve Outcome: Progressing Goal: Respiratory complications will improve Outcome: Progressing Goal: Cardiovascular complication will be avoided Outcome: Progressing   Problem: Health Behavior/Discharge Planning: Goal: Ability to manage health-related needs will improve Outcome: Progressing

## 2023-10-15 NOTE — ED Notes (Signed)
 X ray in room.

## 2023-10-16 ENCOUNTER — Inpatient Hospital Stay (HOSPITAL_COMMUNITY): Payer: Medicare Other

## 2023-10-16 DIAGNOSIS — N179 Acute kidney failure, unspecified: Secondary | ICD-10-CM | POA: Diagnosis not present

## 2023-10-16 DIAGNOSIS — J90 Pleural effusion, not elsewhere classified: Secondary | ICD-10-CM

## 2023-10-16 DIAGNOSIS — I10 Essential (primary) hypertension: Secondary | ICD-10-CM

## 2023-10-16 DIAGNOSIS — U071 COVID-19: Secondary | ICD-10-CM

## 2023-10-16 DIAGNOSIS — L8941 Pressure ulcer of contiguous site of back, buttock and hip, stage 1: Secondary | ICD-10-CM | POA: Insufficient documentation

## 2023-10-16 DIAGNOSIS — G40909 Epilepsy, unspecified, not intractable, without status epilepticus: Secondary | ICD-10-CM

## 2023-10-16 DIAGNOSIS — N1832 Chronic kidney disease, stage 3b: Secondary | ICD-10-CM

## 2023-10-16 DIAGNOSIS — D649 Anemia, unspecified: Secondary | ICD-10-CM

## 2023-10-16 DIAGNOSIS — R6520 Severe sepsis without septic shock: Secondary | ICD-10-CM | POA: Diagnosis not present

## 2023-10-16 DIAGNOSIS — A419 Sepsis, unspecified organism: Secondary | ICD-10-CM | POA: Diagnosis not present

## 2023-10-16 LAB — TYPE AND SCREEN
ABO/RH(D): A NEG
Antibody Screen: NEGATIVE

## 2023-10-16 LAB — BASIC METABOLIC PANEL
Anion gap: 10 (ref 5–15)
BUN: 71 mg/dL — ABNORMAL HIGH (ref 8–23)
CO2: 15 mmol/L — ABNORMAL LOW (ref 22–32)
Calcium: 9.4 mg/dL (ref 8.9–10.3)
Chloride: 108 mmol/L (ref 98–111)
Creatinine, Ser: 2.91 mg/dL — ABNORMAL HIGH (ref 0.61–1.24)
GFR, Estimated: 22 mL/min — ABNORMAL LOW (ref >60)
Glucose, Bld: 118 mg/dL — ABNORMAL HIGH (ref 70–99)
Potassium: 4 mmol/L (ref 3.5–5.1)
Sodium: 133 mmol/L — ABNORMAL LOW (ref 135–145)

## 2023-10-16 LAB — RETICULOCYTES
Immature Retic Fract: 13.9 % (ref 2.3–15.9)
RBC.: 2.85 MIL/uL — ABNORMAL LOW (ref 4.22–5.81)
Retic Count, Absolute: 34.5 10*3/uL (ref 19.0–186.0)
Retic Ct Pct: 1.2 % (ref 0.4–3.1)

## 2023-10-16 LAB — IRON AND TIBC
Iron: 11 ug/dL — ABNORMAL LOW (ref 45–182)
Saturation Ratios: 8 % — ABNORMAL LOW (ref 17.9–39.5)
TIBC: 133 ug/dL — ABNORMAL LOW (ref 250–450)
UIBC: 122 ug/dL

## 2023-10-16 LAB — CBC
HCT: 25.2 % — ABNORMAL LOW (ref 39.0–52.0)
Hemoglobin: 8.2 g/dL — ABNORMAL LOW (ref 13.0–17.0)
MCH: 31.3 pg (ref 26.0–34.0)
MCHC: 32.5 g/dL (ref 30.0–36.0)
MCV: 96.2 fL (ref 80.0–100.0)
Platelets: 279 10*3/uL (ref 150–400)
RBC: 2.62 MIL/uL — ABNORMAL LOW (ref 4.22–5.81)
RDW: 15.9 % — ABNORMAL HIGH (ref 11.5–15.5)
WBC: 17.1 10*3/uL — ABNORMAL HIGH (ref 4.0–10.5)
nRBC: 0 % (ref 0.0–0.2)

## 2023-10-16 LAB — ABO/RH: ABO/RH(D): A NEG

## 2023-10-16 LAB — FERRITIN: Ferritin: 356 ng/mL — ABNORMAL HIGH (ref 24–336)

## 2023-10-16 LAB — FOLATE: Folate: 5.6 ng/mL — ABNORMAL LOW (ref >5.9)

## 2023-10-16 LAB — VITAMIN B12: Vitamin B-12: 940 pg/mL — ABNORMAL HIGH (ref 180–914)

## 2023-10-16 LAB — PROCALCITONIN: Procalcitonin: 33.91 ng/mL

## 2023-10-16 MED ORDER — LACTATED RINGERS IV SOLN
INTRAVENOUS | Status: AC
  Administered 2023-10-16: 100 mL/h via INTRAVENOUS

## 2023-10-16 MED ORDER — SENNA 8.6 MG PO TABS
2.0000 | ORAL_TABLET | Freq: Every day | ORAL | Status: DC
  Administered 2023-10-16 – 2023-10-17 (×2): 17.2 mg via ORAL
  Filled 2023-10-16 (×2): qty 2

## 2023-10-16 MED ORDER — PIPERACILLIN-TAZOBACTAM IN DEX 2-0.25 GM/50ML IV SOLN
2.2500 g | Freq: Four times a day (QID) | INTRAVENOUS | Status: DC
  Administered 2023-10-16 – 2023-10-20 (×15): 2.25 g via INTRAVENOUS
  Filled 2023-10-16 (×18): qty 50

## 2023-10-16 MED ORDER — SODIUM CHLORIDE 0.9 % IV SOLN
100.0000 mg | Freq: Once | INTRAVENOUS | Status: AC
  Administered 2023-10-16: 100 mg via INTRAVENOUS
  Filled 2023-10-16: qty 5

## 2023-10-16 MED ORDER — FOLIC ACID 1 MG PO TABS
1.0000 mg | ORAL_TABLET | Freq: Every day | ORAL | Status: DC
  Administered 2023-10-17 – 2023-10-26 (×9): 1 mg via ORAL
  Filled 2023-10-16 (×9): qty 1

## 2023-10-16 NOTE — Assessment & Plan Note (Signed)
 Unclear as to how much COVID-19 is contributing to patient's presentation Airborne and contact isolation In the absence of significant hypoxia we will abstain from initiating remdesivir or barcitinib

## 2023-10-16 NOTE — Progress Notes (Addendum)
 Pharmacy Antibiotic Note  Wesley [name] is a 73 y.o. male admitted on 10/14/2023 with sepsis secondary to  HCAP  vs. recurrent UTI. Pharmacy has been consulted for Zosyn  dosing.  PMH includes past medical history of bladder cancer status post cystectomy and ileal conduit.     Plan: Adjust Zosyn  to 2.25g IV q6h for PNA indication for CrCl < 20 ml/min Monitor renal function, cultures, clinical course  Height: 5' 4 (162.6 cm) Weight: 47.7 kg (105 lb 2.6 oz) IBW/kg (Calculated) : 59.2  Temp (24hrs), Avg:98.4 F (36.9 C), Min:97.9 F (36.6 C), Max:98.9 F (37.2 C)  Recent Labs  Lab 10/14/23 2237 10/14/23 2250 10/16/23 0511  WBC 26.6*  --  17.1*  CREATININE 2.58*  --  2.91*  LATICACIDVEN  --  1.2  --     Estimated Creatinine Clearance: 15 mL/min (A) (by C-G formula based on SCr of 2.91 mg/dL (H)).    Allergies  Allergen Reactions   Sulfa Antibiotics Rash    Childhood    Antimicrobials this admission: 1/6 cefepime  x1  1/7 Zosyn  >>   Microbiology results: 1/6 BCx: ngtd 1/6 UCx: > 100K Klebsiella pneumoniae, Enterococcus gallinarum 1/6 COVID+ 1/7 pleural fluid: ngtd 1/7 MRSA PCR: neg   Shylyn Younce, PharmD, BCPS Clinical Pharmacist 10/16/2023 3:04 PM

## 2023-10-16 NOTE — Progress Notes (Addendum)
 PROGRESS NOTE   Wesley [name]  FMW:[id number] DOB: 1951-08-31 DOA: 10/14/2023 PCP: [name], MD   Date of Service: the patient was seen and examined on 10/16/2023  Brief Narrative:  73 y.o. 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  Of note, patient was recently hospitalized at [facility] discharged home on 10/03/2023 after a stay for sepsis secondary to UTI and was discharged on oral antibiotics.  Upon evaluation in the emergency department patient was found to be tachycardic and febrile with a white blood cell count of 26.6 as well as acute kidney injury with creatinine of 2.6.  Patient was felt to be suffering from severe sepsis with acute kidney injury.  Patient was initiated on intravenous fluids, as well as intravenous antibiotics antibiotics with vancomycin  and Zosyn .  The hospitalist group was then called to assess the patient for admission to the hospital.  Patient was found to be COVID-positive and placed on contact and airborne isolation.  PCCM was consulted concerning the right-sided pleural effusion.  After the patient was identified to have a loculated effusion Dr. Kassie with PCCM was consulted and recommended chest tube placement which was performed on 1/7 successfully and set to -20 cm of H2O.    Assessment & Plan Severe sepsis (HCC) Leukocytosis improving  thought to be secondary to pneumonia complicated by right-sided pleural effusion Addition to complicated by concurrent COVID-19 infection Continuing intravenous antibiotic therapy with Zosyn  Following  pleural fluid culture and blood cultures. Acute renal failure superimposed on stage 3b chronic kidney disease (HCC) Creatinine continuing to trend upwards Placing patient on intravenous LR infusion Obtaining renal ultrasound Strict input and output monitoring Monitoring renal function and electrolytes with serial chemistries COVID-19 virus infection Unclear as to how much COVID-19 is contributing to patient's presentation Airborne and contact isolation In the absence of significant hypoxia we will abstain from initiating remdesivir or barcitinib Pleural effusion on right Status post chest tube placement 1/7 PCCM following, their assistance is appreciated For pleural fluid culture and cytology Fluid appears to be transudative per lights criteria  Essential hypertension Continue amlodipine  As needed intravenous hypertensives for markedly elevated blood pressure Seizure disorder (HCC) Continue Keppra  Pressure injury of contiguous region involving left buttock and hip, stage 1 Present on admission  frequent turns Application of Mepilex to area daily Normocytic anemia No clinical evidence of bleeding Anemia workup revealing folate deficiency as well as an iron  panel suggestive of iron  deficiency and anemia of chronic disease. Initiating intravenous folic acid  Initiating intravenous iron  supplementation.     Subjective:  Patient is complaining of severe generalized weakness.  Patient also complaining of intermittent right-sided chest discomfort around chest tube insertion site.  Patient states that the pain in his right chest was worse this morning but has improved.  Pain is moderate in intensity, nonradiating, sharp in quality.  Patient denies significant associated shortness of breath.  Physical Exam:  Vitals:   10/16/23 1400 10/16/23 1500 10/16/23 1600 10/16/23 2100  BP:    122/72  Pulse:    73  Resp: 11 (!) 8 12   Temp:    98 F (36.7 C)  TempSrc:  Oral  SpO2:    99%   Weight:      Height:         Constitutional: Lethargic but arousable, oriented x 3 no associated distress.   Skin: Extremely dry flaky skin with poor skin turgor.   Eyes: Pupils are equally reactive to light.  No evidence of scleral icterus or conjunctival pallor.  ENMT: Moist mucous membranes noted.  Posterior pharynx clear of any exudate or lesions.   Respiratory: Markedly diminished breath sounds in the right base with intermittent mild expiratory wheezing.   no crackles. Normal respiratory effort. No accessory muscle use.  Cardiovascular: Regular rate and rhythm, no murmurs / rubs / gallops. No extremity edema. 2+ pedal pulses. No carotid bruits.  Abdomen: Abdomen is soft and nontender.  No evidence of intra-abdominal masses.  Positive bowel sounds noted in all quadrants.   Musculoskeletal: No joint deformity upper and lower extremities. Good ROM, no contractures.  Poor muscle tone noted.   Data Reviewed:  I have personally reviewed and interpreted labs, imaging.  Significant findings are   CBC: Recent Labs  Lab 10/14/23 2237 10/16/23 0511  WBC 26.6* 17.1*  NEUTROABS 24.8*  --   HGB 10.3* 8.2*  HCT 31.4* 25.2*  MCV 94.9 96.2  PLT 412* 279   Basic Metabolic Panel: Recent Labs  Lab 10/14/23 2237 10/16/23 0511  NA 135 133*  K 3.9 4.0  CL 109 108  CO2 15* 15*  GLUCOSE 123* 118*  BUN 60* 71*  CREATININE 2.58* 2.91*  CALCIUM 10.1 9.4   GFR: Estimated Creatinine Clearance: 15 mL/min (A) (by C-G formula based on SCr of 2.91 mg/dL (H)). Liver Function Tests: Recent Labs  Lab 10/14/23 2237 10/15/23 1437  AST 24  --   ALT 17  --   ALKPHOS 157*  --   BILITOT 0.6  --   PROT 7.0 6.5  ALBUMIN 2.8*  --     Coagulation Profile: Recent Labs  Lab 10/14/23 2237  INR 1.2   Code Status:  Full code.  Code status decision has been confirmed with: patient/ wife Family Communication: Wife is at bedside who has been updated on plan of care   Severity of  Illness:  The appropriate patient status for this patient is INPATIENT. Inpatient status is judged to be reasonable and necessary in order to provide the required intensity of service to ensure the patient's safety. The patient's presenting symptoms, physical exam findings, and initial radiographic and laboratory data in the context of their chronic comorbidities is felt to place them at high risk for further clinical deterioration. Furthermore, it is not anticipated that the patient will be medically stable for discharge from the hospital within 2 midnights of admission.   * I certify that at the point of admission it is my clinical judgment that the patient will require inpatient hospital care spanning beyond 2 midnights from the point of admission due to high intensity of service, high risk for further deterioration and high frequency of surveillance required.*  Time spent:  54 minutes  Author:  Zachary JINNY Ba MD  10/16/2023 10:18 PM

## 2023-10-16 NOTE — Assessment & Plan Note (Signed)
 Present on admission  frequent turns Application of Mepilex to area daily

## 2023-10-16 NOTE — Assessment & Plan Note (Signed)
 Continue Keppra

## 2023-10-16 NOTE — Hospital Course (Addendum)
 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 Recently hospitalized and treated for a UTI. Incidentally positive for COVID on admission.  No indication for any treatment for COVID. Was found to have a loculated pleural effusion on the right.  PCCM was consulted.  Underwent chest replacement.  Chest tube was removed on 1/12. Also found to have worsening hydronephrosis.  Urology was consulted.  Recommended bilateral PCN placement with IR.  Procedure was performed on 1/10.  Assessment and Plan: Severe sepsis secondary to pneumonia and recurrent UTI. Met SIRS criteria on admission with leukocytosis, fever, tachycardia. Source of infection is mostly pneumonia as well as a UTI. Urine culture growing Klebsiella and Enterococcus. Grew the same bacteria earlier during the earlier admission. Incidentally COVID-19 positive although not treated and so far doing well. Initially was on cefepime .  Now on Zosyn .  Later on transition to Zosyn  plus Zyvox  and currently on Levaquin  to target both bacteria with 1 antibiotic.  Last day 1/14 for a total 7-day treatment course.  Malignant pleural effusion. Found to have loculated pleural effusion at the time of admission. PCCM was consulted. Underwent chest tube placement. Pleural fluid culture negative. Cytology appears to have squamous cells concerning for metastatic malignancy per discussion between pathology and Dr. Kara 1/10. Chest tube was removed on 1/12. PCCM is recommended that if there is any reaccumulation Pleurx catheter would be beneficial for the patient.  AKI on CKD 3B. Baseline creatinine 1.6/1.8. On admission serum creatinine 2.9. Currently improving.  Likely combination of obstructive uropathy as well as prerenal etiologies in the setting of  infection.  Malignant Bilateral hydronephrosis Has history of bladder cancer. Has AKI and UTI.   Ultrasound renal showed evidence of bilateral hydronephrosis. Urology recommended to bilateral PCN. IR consulted. Renal function improving.  Incidental COVID-19 virus infection COVID positive on 1/6, continue isolation until 1/16. Decision was made not to initiate any therapy. Do not think the COVID-19 is actually causing any significant infection right now.  For now we will continue to monitor.  Urothelial carcinoma of bladder  Imaging studies concerning for lung metastases, bone metastases and peritoneal metastases with evidence of likely right-sided malignant effusion Performance status is worsening with poor prognosis. Appreciate oncology consultation. Family still hopeful.  Goals of care, counseling/discussion Patient is suffering from numerous complications of likely metastatic urothelial malignancy Prognosis has potentially worsened throughout this prolonged hospitalization with numerous complications including a malignant right-sided pleural effusion renal injury and bilateral hydronephrosis in the setting of protein calorie malnutrition and continued weight loss Lengthy discussions with the patient and wife surrounding the patient's guarded prognosis nearly daily.  Patient and his wife still wish to pursue all avenues of therapy at this time.  Patient is full code. Palliative care following, their input is appreciated. 1/14 conversation with wife and patient.  I informed them that currently patient does not have any acute medical issues but he has multiple chronic medical issues which are worrisome for poor prognosis.  I have informed that going through cardiac arrest event with CPR would not result in favorable outcome for the patient.  Essential hypertension Continue amlodipine   Seizure disorder Continue Keppra   Pressure injury of contiguous region involving left buttock and  hip, stage 1, Present on admission  Continue dressing changes.  Normocytic anemia, folic acid  deficiency as well as iron  deficiency anemia. H&H stable.  No bleeding.  Monitor. No clinical evidence of bleeding  Continue supplementation.  Adult failure to thrive. Severe protein calorie malnutrition. Body mass index is 18.05 kg/m.  Placing the patient at high risk for poor outcome. Interventions: Ensure Enlive (each supplement provides 350kcal and 20 grams of protein), MVI, Magic cup

## 2023-10-16 NOTE — Assessment & Plan Note (Signed)
 Leukocytosis improving  thought to be secondary to pneumonia complicated by right-sided pleural effusion Addition to complicated by concurrent COVID-19 infection Continuing intravenous antibiotic therapy with Zosyn  Following pleural fluid culture and blood cultures.

## 2023-10-16 NOTE — Assessment & Plan Note (Addendum)
 Creatinine continuing to trend upwards Placing patient on intravenous LR infusion Obtaining renal ultrasound Strict input and output monitoring Monitoring renal function and electrolytes with serial chemistries

## 2023-10-16 NOTE — Assessment & Plan Note (Signed)
 Status post chest tube placement 1/7 PCCM following, their assistance is appreciated For pleural fluid culture and cytology Fluid appears to be transudative per lights criteria

## 2023-10-16 NOTE — Assessment & Plan Note (Signed)
 Continue amlodipine As needed intravenous hypertensives for markedly elevated blood pressure

## 2023-10-16 NOTE — Progress Notes (Addendum)
 NAME:  Wesley [name], MRN:  [id number], DOB:  08-Dec-1950, LOS: 1 ADMISSION DATE:  10/14/2023, CONSULTATION DATE:  1/7 REFERRING MD:  Zella, CHIEF COMPLAINT:  recurrent loculated pleural effusion    History of Present Illness:  This is a 73 year old male w/ sig h/o squamous cell carcinoma of scalp, recurrent hydronephrosis now s/p cystoprostatectomy w/ placement of ileal conduit (aug 2024) for bladder cancer. Has been hospitalized several times for recurrent urinary tract sepsis. Also followed by Dr Neda w/ our pulm group initially for loculated right pleural effusion first noted in Oct s/p urinary procedure felt symptomatic and resolved. Also noted to have Rounded density in RLL as well as RLL nodules which have been followed in the out pt setting. His most recent f/U CT of abd/pelvis obtained during admission (dec 23) for one of his recurrent sepsis admits showed small right effusion and on-going right lower lobe rounded density as well as increase in size of RLL nodule.  He was discharged 12/26 completed abx Presented back to ER 1/6 w/ cc: fever generalized weakness  x 1d In ER wbc ct 26.6, tachycardic, scr up to 2.6. On further questioning reports some increased shortness of breath since dc last.   Cultures sent. ABX started. CXR obtained showed increased density over right hemithorax raising concern for loculated effusion  A CT chest/abd/pelvis obtained showed RLL airspace disease w/ air bronchograms. LLL opacity. Mod sized loculated right effusion. The previoulsy noted rounded right LL density and new nodularity along the left subpleural location. Also showed. 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 S/p robotic cystoprostatectomy w/ placement of ileal conduit (aug 2024) Prior loculated right pleural effusion.  Uropsepsis  Seizure disorder: Oxtellar XR  600 mg daily and Keppra  500 mg three times a day  Essential tremors  UC Prior smoker Vertigo w/ falls at home  CKD stage 4  Significant Hospital Events: Including procedures, antibiotic start and stop dates in addition to other pertinent events   1/6 admitted w/ sepsis felt UT source vs HCAP w/ complicated right pleural effusion. COVID positive. Blood cultures pending. Zosyn  started  1/7 CT obtained. Marked increase in right loculated effusion. RLL PNA vs atx stable RLL density and new nodular left pleural based changes. PCCM asked to eval >right small bore chest tube placed  1/8 No issues overnight, 650 total output in [place]   Interim History / Subjective:  Seen lying in be with complaints of mild chest tube insertions site pain, improved with oral pain meds   Objective   Blood pressure 107/66, pulse 96, temperature 98.9 F (37.2 C), temperature source Oral, resp. rate 14, height 5' 4 (1.626 m), weight 47.7 kg, SpO2 99%.        Intake/Output Summary (Last 24 hours) at 10/16/2023 0951 Last data filed at 10/16/2023 0857 Gross per 24 hour  Intake 554.56 ml  Output 1000 ml  Net -445.44 ml   Filed Weights   10/14/23 2233 10/15/23 2200  Weight: 49.7 kg 47.7 kg    Examination: General: Acute on chronically ill appearing elderly severely deconditioned male lying in bed, in NAD HEENT: [place]/AT, MM pink/moist, PERRL,  Neuro: Alert  and oriented x3, non-focal  CV: s1s2 regular rate and rhythm, no murmur, rubs, or gallops,  PULM:  Slightly diminished bilaterally, no increased work of breathing, right chest tube to suction  GI: soft, bowel sounds active in all 4 quadrants, non-tender, non-distended, tolerating oral diet Extremities:  warm/dry, no edema  Skin: no rashes or lesions  Resolved Hospital Problem list     Assessment & Plan:  Severe sepsis  RLL PNA Complicated right pleural effusion RLL lung density and new Left pleural based nodular density worrisome for malignancy vs mets Metastatic Bladder cancer s/p cystoprostatectomy w/ placement of ileal conduit (aug 2024)  Chronic bilateral hydronephrosis  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  Now presents with fever, right lower lobe infiltrate, and significant increase in loculated fluid collection.  Although certainly this could be representative of his metastasis, the fluid has changed acutely in the context of pneumonia -If the pleural fluid turns out to be malignant cells we will monitor with current tube in place until output less than 50 cc in 24 hours then discontinue.  Will then have to determine whether or not palliative Pleurx would be needed P: Continue Zosyn   Routine chest tube care  Follow chest tube output  Daily CXR Follow cultures and cytology, confirmed the labs has plerual sample and will run cytology today  Ensure adequate pain control  Mobilize as able  Encourage pulmonary hygiene    Best Practice (right click and Reselect all SmartList Selections daily)  Per primary   Critical care time: NA  Shamica Moree D. Harris, NP-C [facility] Personal contact information can be found on Amion  If no contact or response made please call [phone] 10/16/2023, 9:51 AM

## 2023-10-16 NOTE — TOC Initial Note (Signed)
 Transition of Care [facility]) - Initial/Assessment Note    Patient Details  Name: Wesley [name] MRN: [id number] Date of Birth: June 15, 1951  Transition of Care [facility] CM/SW Contact:    Bascom Service, RN Phone Number: 10/16/2023, 3:44 PM  Clinical Narrative: d/c plan home.                  Expected Discharge Plan: Home/Self Care Barriers to Discharge: Continued Medical Work up   Patient Goals and CMS Choice Patient states their goals for this hospitalization and ongoing recovery are:: Home CMS Medicare.gov Compare Post Acute Care list provided to:: Patient        Expected Discharge Plan and Services                                              Prior Living Arrangements/Services                       Activities of Daily Living   ADL Screening (condition at time of admission) Independently performs ADLs?: No Does the patient have a NEW difficulty with bathing/dressing/toileting/self-feeding that is expected to last >3 days?: Yes (Initiates electronic notice to provider for possible OT consult) Does the patient have a NEW difficulty with getting in/out of bed, walking, or climbing stairs that is expected to last >3 days?: Yes (Initiates electronic notice to provider for possible PT consult) Does the patient have a NEW difficulty with communication that is expected to last >3 days?: No Is the patient deaf or have difficulty hearing?: No Does the patient have difficulty seeing, even when wearing glasses/contacts?: No Does the patient have difficulty concentrating, remembering, or making decisions?: No  Permission Sought/Granted                  Emotional Assessment              Admission diagnosis:  AKI (acute kidney injury) (HCC) [N17.9] Severe sepsis (HCC) [A41.9, R65.20] Sepsis with acute renal failure without septic shock, due to unspecified organism, unspecified acute renal failure type (HCC) [A41.9, R65.20, N17.9] COVID-19 [U07.1] Patient  Active Problem List   Diagnosis Date Noted   Acute renal failure superimposed on stage 3b chronic kidney disease (HCC) 10/16/2023   Pressure injury of skin 10/16/2023   Pleural effusion on right 10/16/2023   COVID-19 virus infection 10/16/2023   Seizure disorder (HCC) 10/16/2023   Severe sepsis (HCC) 10/15/2023   CKD (chronic kidney disease) stage 4, GFR 15-29 ml/min (HCC) 09/30/2023   Ulcerative colitis (HCC) 09/30/2023   UTI (urinary tract infection) 09/30/2023   Constipation 07/23/2023   CKD stage 3b, GFR 30-44 ml/min (HCC) 07/22/2023   Complex partial seizures (HCC) 07/22/2023   Sepsis secondary to UTI (HCC) 07/22/2023   Essential hypertension 07/22/2023   Protein-calorie malnutrition, severe 07/22/2023   Bladder cancer (HCC) 05/22/2023   Surgical counseling visit 05/20/2023   Renal stone 03/29/2023   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) 02/22/2023   Hematuria 03/01/2012   Acute retention of urine 03/01/2012   PCP:  [name], MD Pharmacy:   [facility] [phone] - [place], [place] - [address] [place] [address] Phone: [phone] Fax: [phone]     Social Drivers of Health (SDOH) Social History: SDOH Screenings   Food Insecurity: No  Food Insecurity (10/15/2023)  Housing: Low Risk  (10/15/2023)  Transportation Needs: No Transportation Needs (10/15/2023)  Utilities: Not At Risk (10/15/2023)  Alcohol Screen: Low Risk  (02/26/2023)  Depression (PHQ2-9): Low Risk  (02/26/2023)  Financial Resource Strain: Low Risk  (09/26/2023)   Received from [facility]  Physical Activity: Insufficiently Active (09/26/2023)   Received from [facility]  Social Connections: Socially Integrated (10/15/2023)  Stress: No Stress Concern Present (09/26/2023)   Received from [facility]  Tobacco Use: Medium Risk (10/15/2023)   SDOH Interventions:     Readmission Risk Interventions    05/23/2023   10:18 AM   Readmission Risk Prevention Plan  Post Dischage Appt Complete  Medication Screening Complete  Transportation Screening Complete

## 2023-10-16 NOTE — Evaluation (Signed)
 Physical Therapy Evaluation Patient Details Name: FLOR HOUDESHELL MRN: [id number] DOB: Feb 19, 1951 Today's Date: 10/16/2023  History of Present Illness  73 yo male admitted with Pna, Sepsis, pleural effusion. S/P chest tube placement. Hx of essential tremor, anemia, hiatal heernia, meningioma resection with residual hearing loss, CKD, ulcerative colitis, Sz d/o, TURBT, bony mets, falls, vertigo, bladder Ca, squamous cell ca, COVID  Clinical Impression  On eval, pt required Min A for mobility. He was able to stand and take several side steps along the bedside with use of RW. Pt is weak and fatigues easily. O2 100% on 2L, HR 99 bpm, dyspnea 2/4 during session. Will plan to progress activity as pt is able to tolerate.  Recommend HHPT f/u at this time-will update recommendations as needed. Wife reports she can provide 24/7 supervision/assist.       If plan is discharge home, recommend the following: A little help with walking and/or transfers;A little help with bathing/dressing/bathroom;Assistance with cooking/housework;Assist for transportation;Help with stairs or ramp for entrance   Can travel by private vehicle        Equipment Recommendations None recommended by PT  Recommendations for Other Services  OT consult    Functional Status Assessment Patient has had a recent decline in their functional status and demonstrates the ability to make significant improvements in function in a reasonable and predictable amount of time.     Precautions / Restrictions Precautions Precautions: Fall Precaution Comments: chest tube Restrictions Weight Bearing Restrictions Per Provider Order: No      Mobility  Bed Mobility Overal bed mobility: Needs Assistance Bed Mobility: Supine to Sit, Sit to Supine     Supine to sit: Contact guard, HOB elevated, Used rails Sit to supine: Contact guard assist, HOB elevated, Used rails   General bed mobility comments: Increased time. Effortful.     Transfers Overall transfer level: Needs assistance Equipment used: Rolling walker (2 wheels) Transfers: Sit to/from Stand Sit to Stand: Min assist, From elevated surface           General transfer comment: Assist to rise, steady, control descent. Cues for safety, hand placement.    Ambulation/Gait Ambulation/Gait assistance: Min assist   Assistive device: Rolling walker (2 wheels)       Pre-gait activities: Side steps along bedside with RW. Fatigues easily. General Gait Details: Assist to steady pt and manage RW. May need +2 chair follow.  Stairs            Wheelchair Mobility     Tilt Bed    Modified Rankin (Stroke Patients Only)       Balance Overall balance assessment: Needs assistance   Sitting balance-Leahy Scale: Fair Sitting balance - Comments: Tend to lean laterally or forwards to prop   Standing balance support: Bilateral upper extremity supported, During functional activity, Reliant on assistive device for balance Standing balance-Leahy Scale: Poor                               Pertinent Vitals/Pain Pain Assessment Pain Assessment: Faces Faces Pain Scale: Hurts even more Pain Location: abdomen Pain Descriptors / Indicators: Grimacing, Guarding Pain Intervention(s): Limited activity within patient's tolerance, Monitored during session, Repositioned    Home Living Family/patient expects to be discharged to:: Private residence Living Arrangements: Spouse/significant other Available Help at Discharge: Family Type of Home: House Home Access: Stairs to enter   Secretary/administrator of Steps: 3   Home Layout: Able to live  on main level with bedroom/bathroom Home Equipment: None      Prior Function Prior Level of Function : Independent/Modified Independent                     Extremity/Trunk Assessment   Upper Extremity Assessment Upper Extremity Assessment: Defer to OT evaluation    Lower Extremity  Assessment Lower Extremity Assessment: Generalized weakness (muscle wasting)    Cervical / Trunk Assessment Cervical / Trunk Assessment: Normal  Communication   Communication Communication: No apparent difficulties  Cognition Arousal: Alert Behavior During Therapy: WFL for tasks assessed/performed Overall Cognitive Status: Within Functional Limits for tasks assessed                                          General Comments      Exercises     Assessment/Plan    PT Assessment Patient needs continued PT services  PT Problem List Decreased strength;Decreased activity tolerance;Decreased balance;Decreased mobility;Pain;Decreased knowledge of use of DME;Cardiopulmonary status limiting activity       PT Treatment Interventions DME instruction;Gait training;Stair training;Functional mobility training;Therapeutic activities;Therapeutic exercise;Patient/family education;Balance training    PT Goals (Current goals can be found in the Care Plan section)  Acute Rehab PT Goals Patient Stated Goal: to get better PT Goal Formulation: With patient/family Time For Goal Achievement: 10/30/23 Potential to Achieve Goals: Good    Frequency Min 1X/week     Co-evaluation               AM-PAC PT 6 Clicks Mobility  Outcome Measure Help needed turning from your back to your side while in a flat bed without using bedrails?: A Little Help needed moving from lying on your back to sitting on the side of a flat bed without using bedrails?: A Little Help needed moving to and from a bed to a chair (including a wheelchair)?: A Little Help needed standing up from a chair using your arms (e.g., wheelchair or bedside chair)?: A Little Help needed to walk in hospital room?: A Lot Help needed climbing 3-5 steps with a railing? : A Lot 6 Click Score: 16    End of Session Equipment Utilized During Treatment: Oxygen Activity Tolerance: Patient limited by fatigue Patient left: in  bed;with call bell/phone within reach;with bed alarm set;with family/visitor present   PT Visit Diagnosis: Muscle weakness (generalized) (M62.81);Difficulty in walking, not elsewhere classified (R26.2)    Time: 8378-8343 PT Time Calculation (min) (ACUTE ONLY): 35 min   Charges:   PT Evaluation $PT Eval Low Complexity: 1 Low PT Treatments $Gait Training: 8-22 mins PT General Charges $$ ACUTE PT VISIT: 1 Visit            Dannial SQUIBB, PT Acute Rehabilitation  Office: [phone]

## 2023-10-16 NOTE — Assessment & Plan Note (Signed)
 No clinical evidence of bleeding Anemia workup revealing folate deficiency as well as an iron panel suggestive of iron deficiency and anemia of chronic disease. Initiating intravenous folic acid Initiating intravenous iron supplementation.

## 2023-10-17 ENCOUNTER — Inpatient Hospital Stay (HOSPITAL_COMMUNITY): Payer: Medicare Other

## 2023-10-17 ENCOUNTER — Other Ambulatory Visit (HOSPITAL_COMMUNITY): Payer: Medicare Other

## 2023-10-17 DIAGNOSIS — A419 Sepsis, unspecified organism: Secondary | ICD-10-CM | POA: Diagnosis not present

## 2023-10-17 DIAGNOSIS — I10 Essential (primary) hypertension: Secondary | ICD-10-CM | POA: Diagnosis not present

## 2023-10-17 DIAGNOSIS — R6520 Severe sepsis without septic shock: Secondary | ICD-10-CM | POA: Diagnosis not present

## 2023-10-17 DIAGNOSIS — N179 Acute kidney failure, unspecified: Secondary | ICD-10-CM | POA: Diagnosis not present

## 2023-10-17 DIAGNOSIS — U071 COVID-19: Secondary | ICD-10-CM | POA: Diagnosis not present

## 2023-10-17 LAB — MAGNESIUM: Magnesium: 2.6 mg/dL — ABNORMAL HIGH (ref 1.7–2.4)

## 2023-10-17 LAB — CBC WITH DIFFERENTIAL/PLATELET
Abs Immature Granulocytes: 0.07 10*3/uL (ref 0.00–0.07)
Basophils Absolute: 0 10*3/uL (ref 0.0–0.1)
Basophils Relative: 0 %
Eosinophils Absolute: 0.1 10*3/uL (ref 0.0–0.5)
Eosinophils Relative: 1 %
HCT: 23.4 % — ABNORMAL LOW (ref 39.0–52.0)
Hemoglobin: 7.6 g/dL — ABNORMAL LOW (ref 13.0–17.0)
Immature Granulocytes: 1 %
Lymphocytes Relative: 7 %
Lymphs Abs: 0.6 10*3/uL — ABNORMAL LOW (ref 0.7–4.0)
MCH: 31.3 pg (ref 26.0–34.0)
MCHC: 32.5 g/dL (ref 30.0–36.0)
MCV: 96.3 fL (ref 80.0–100.0)
Monocytes Absolute: 0.7 10*3/uL (ref 0.1–1.0)
Monocytes Relative: 7 %
Neutro Abs: 7.9 10*3/uL — ABNORMAL HIGH (ref 1.7–7.7)
Neutrophils Relative %: 84 %
Platelets: 255 10*3/uL (ref 150–400)
RBC: 2.43 MIL/uL — ABNORMAL LOW (ref 4.22–5.81)
RDW: 15.9 % — ABNORMAL HIGH (ref 11.5–15.5)
WBC: 9.4 10*3/uL (ref 4.0–10.5)
nRBC: 0 % (ref 0.0–0.2)

## 2023-10-17 LAB — COMPREHENSIVE METABOLIC PANEL
ALT: 19 U/L (ref 0–44)
AST: 19 U/L (ref 15–41)
Albumin: 1.9 g/dL — ABNORMAL LOW (ref 3.5–5.0)
Alkaline Phosphatase: 104 U/L (ref 38–126)
Anion gap: 11 (ref 5–15)
BUN: 74 mg/dL — ABNORMAL HIGH (ref 8–23)
CO2: 17 mmol/L — ABNORMAL LOW (ref 22–32)
Calcium: 9.2 mg/dL (ref 8.9–10.3)
Chloride: 105 mmol/L (ref 98–111)
Creatinine, Ser: 3.14 mg/dL — ABNORMAL HIGH (ref 0.61–1.24)
GFR, Estimated: 20 mL/min — ABNORMAL LOW (ref >60)
Glucose, Bld: 99 mg/dL (ref 70–99)
Potassium: 3.6 mmol/L (ref 3.5–5.1)
Sodium: 133 mmol/L — ABNORMAL LOW (ref 135–145)
Total Bilirubin: 0.4 mg/dL (ref 0.0–1.2)
Total Protein: 5.6 g/dL — ABNORMAL LOW (ref 6.5–8.1)

## 2023-10-17 LAB — CYTOLOGY - NON PAP

## 2023-10-17 MED ORDER — STERILE WATER FOR INJECTION IJ SOLN
5.0000 mg | Freq: Once | INTRAMUSCULAR | Status: AC
  Administered 2023-10-17: 5 mg via INTRAPLEURAL
  Filled 2023-10-17: qty 5

## 2023-10-17 MED ORDER — ADULT MULTIVITAMIN W/MINERALS CH
1.0000 | ORAL_TABLET | Freq: Every day | ORAL | Status: DC
  Administered 2023-10-17 – 2023-10-26 (×9): 1 via ORAL
  Filled 2023-10-17 (×9): qty 1

## 2023-10-17 MED ORDER — SODIUM CHLORIDE (PF) 0.9 % IJ SOLN
10.0000 mg | Freq: Once | INTRAMUSCULAR | Status: AC
  Administered 2023-10-17: 10 mg via INTRAPLEURAL
  Filled 2023-10-17: qty 10

## 2023-10-17 MED ORDER — SODIUM CHLORIDE 0.9% FLUSH
10.0000 mL | Freq: Three times a day (TID) | INTRAVENOUS | Status: DC
  Administered 2023-10-17 – 2023-10-20 (×6): 10 mL via INTRAPLEURAL

## 2023-10-17 NOTE — Assessment & Plan Note (Signed)
 Continue amlodipine As needed intravenous hypertensives for markedly elevated blood pressure

## 2023-10-17 NOTE — Plan of Care (Signed)
  Problem: Nutrition: Goal: Adequate nutrition will be maintained Outcome: Progressing   Problem: Elimination: Goal: Will not experience complications related to urinary retention Outcome: Progressing   Problem: Pain Management: Goal: General experience of comfort will improve Outcome: Progressing   Problem: Safety: Goal: Ability to remain free from injury will improve Outcome: Progressing   Problem: Skin Integrity: Goal: Risk for impaired skin integrity will decrease Outcome: Progressing

## 2023-10-17 NOTE — Assessment & Plan Note (Signed)
 Continue Keppra

## 2023-10-17 NOTE — Progress Notes (Signed)
 Initial Nutrition Assessment  DOCUMENTATION CODES:   Severe malnutrition in context of chronic illness, Underweight  INTERVENTION:   -Ensure Plus High Protein po BID, each supplement provides 350 kcal and 20 grams of protein.   -Multivitamin with minerals daily  -Magic cup BID with meals, each supplement provides 290 kcal and 9 grams of protein   NUTRITION DIAGNOSIS:   Severe Malnutrition related to chronic illness, cancer and cancer related treatments as evidenced by moderate fat depletion, severe muscle depletion, percent weight loss.  GOAL:   Patient will meet greater than or equal to 90% of their needs  MONITOR:   Supplement acceptance, PO intake, Weight trends, Labs, I & O's  REASON FOR ASSESSMENT:   Consult Assessment of nutrition requirement/status  ASSESSMENT:   73 y.o. 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  1/6: admitted 1/7: s/p chest tube insertion  Patient recently admitted 12/23-12-26 at [facility] for sepsis. Pt reporting poor appetite and weight loss that has persisted since surgery in August 2024.  Pt has not been eating well at home and has had increased lethargy as well. Has been found to be COVID+.  Ensure has been ordered, will continue as well as add daily MVI and Magic cups with meals for additional kcals and protein.   Per weight records, pt has lost 28 lbs since 6/17 (21% wt loss x 6.5 months, significant for time frame).   Medications: Folic acid , Senokot  Labs reviewed: Low Na  Elevated Mg Low iron  Low folate   NUTRITION -  FOCUSED PHYSICAL EXAM:  Flowsheet Row Most Recent Value  Orbital Region Moderate depletion  Upper Arm Region Severe depletion  Thoracic and Lumbar Region Moderate depletion  Buccal Region Moderate depletion  Temple Region Moderate depletion  Clavicle Bone Region Severe depletion  Clavicle and Acromion Bone Region Severe depletion  Scapular Bone Region Severe depletion  Dorsal Hand Mild depletion  Patellar Region Moderate depletion  Anterior Thigh Region Moderate depletion  Posterior Calf Region Moderate depletion  Edema (RD Assessment) None  Hair Reviewed  [thinning]  Eyes Reviewed  Mouth Reviewed  Skin Reviewed  Nails Reviewed       Diet Order:   Diet Order             Diet regular Room service appropriate? Yes; Fluid consistency: Thin  Diet effective now                   EDUCATION NEEDS:   No education needs have been identified at this time  Skin:  Skin Assessment: Skin Integrity Issues: Skin Integrity Issues:: Stage I Stage I: buttocks  Last BM:  1/7  Height:   Ht Readings from Last 1 Encounters:  10/15/23 5' 4 (1.626 m)    Weight:   Wt Readings from Last 1 Encounters:  10/15/23 47.7 kg    BMI:  Body mass index is 18.05 kg/m.  Estimated Nutritional Needs:   Kcal:  1700-1900  Protein:  85-100g  Fluid:  1.9L/day  Morna Lee, MS, RD, LDN Inpatient Clinical Dietitian Contact via Secure chat

## 2023-10-17 NOTE — Plan of Care (Signed)

## 2023-10-17 NOTE — Progress Notes (Signed)
 PROGRESS NOTE   Wesley [name]  FMW:[id number] DOB: 11/11/1950 DOA: 10/14/2023 PCP: [name], MD   Date of Service: the patient was seen and examined on 10/17/2023  Brief Narrative:  73 y.o. 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  Of note, patient was recently hospitalized at [facility] discharged home on 10/03/2023 after a stay for sepsis secondary to UTI and was discharged on oral antibiotics.  Upon evaluation in the emergency department patient was found to be tachycardic and febrile with a white blood cell count of 26.6 as well as acute kidney injury with creatinine of 2.6.  Patient was felt to be suffering from severe sepsis with acute kidney injury.  Patient was initiated on intravenous fluids, as well as intravenous antibiotics antibiotics with vancomycin  and Zosyn .  The hospitalist group was then called to assess the patient for admission to the hospital.  Patient was found to be COVID-positive and placed on contact and airborne isolation.  PCCM was consulted concerning the right-sided pleural effusion.  After the patient was identified to have a loculated effusion Dr. Kassie with PCCM was consulted and recommended chest tube placement which was performed on 1/7 successfully and set to -20 cm of H2O.  Furthermore, patient underwent fibrinolysis of the effusion on 1/9.  Hospital course has been complicated by progressively worsening renal injury.  Renal ultrasound has revealed severe left-sided hydronephrosis and what appears to be a new substantial right-sided  hydronephrosis as well.  Case discussed with urology and is recommended the patient undergo bilateral nephrostomy tube placement with IR.     Assessment & Plan Severe sepsis (HCC) Continued improvement in leukocytosis Thought to possibly be secondary to pneumonia complicated by right-sided pleural effusion Status post chest tube placement on 1/7 Presentation complicated by concurrent COVID-19 infection Continuing intravenous antibiotic therapy with Zosyn  Following pleural fluid culture and blood cultures. Acute renal failure superimposed on stage 3b chronic kidney disease (HCC) Creatinine continuing to trend upwards Renal ultrasound performed earlier this morning revealing moderate right sided hydronephrosis not seen on any prior imaging prior to this hospitalization.  Additionally, now severe left hydronephrosis also seen. In the setting of worsening renal function, case discussed with Dr. Devere with urology who is recommending IR consultation in the morning for bilateral nephrostomy tube placement.  Urology will formally see patient tomorrow morning in consultation. Trend patient off of lactated Ringer 's infusion for now. Strict input and output monitoring Monitoring renal function and electrolytes with serial chemistries COVID-19 virus infection COVID positive on 1/6  unclear as to how much COVID-19 is contributing to patient's presentation Airborne and contact isolation In the absence of significant hypoxia we will abstain from initiating remdesivir or barcitinib Pleural effusion on right Status post chest tube placement 1/7 Patient additionally underwent pleural fibrinolysis by PCCM earlier in the day today to break up loculated effusion. Awaiting pleural fluid culture and cytology Original pleural cell count appears to be transudative per lights criteria  Essential hypertension Continue amlodipine  As needed intravenous hypertensives for markedly elevated blood pressure Seizure  disorder (HCC) Continue Keppra  Pressure injury of contiguous region involving left buttock and hip, stage 1 Present on admission  frequent turns Application of Mepilex to area daily Normocytic anemia No clinical evidence of bleeding however hemoglobin is continuing to downtrend Possibly due to hemodilution with intravenous fluids. Anemia workup revealing folate deficiency as well as an iron  panel suggestive of iron  deficiency and anemia of chronic disease. Continue folic acid  Continue iron  supplementation. Will additionally obtain stool Hemoccult.     Subjective:  Patient is seemingly lethargic today and is unable to answer questions appropriately.   Physical Exam:  Vitals:   10/17/23 0753 10/17/23 1331 10/17/23 1838 10/17/23 1945  BP:  131/79 117/72 118/69  Pulse:  100 (!) 105 97  Resp:  16 18 16   Temp:  98.5 F (36.9 C) 98.4 F (36.9 C) 99.3 F (37.4 C)  TempSrc:  Oral Oral Oral  SpO2: 97% 98% 94% 96%  Weight:      Height:         Constitutional: Lethargic but arousable, oriented x 3 no associated distress.   Skin: Extremely dry flaky skin with poor skin turgor.   Eyes: Pupils are equally reactive to light.  No evidence of scleral icterus or conjunctival pallor.  ENMT: Moist mucous membranes noted.  Posterior pharynx clear of any exudate or lesions.   Respiratory: Markedly diminished breath sounds in the right base with intermittent mild expiratory wheezing.   no crackles. Normal respiratory effort. No accessory muscle use.  Cardiovascular: Regular rate and rhythm, no murmurs / rubs / gallops. No extremity edema. 2+ pedal pulses. No carotid bruits.  Abdomen: Abdomen is soft and nontender.  No evidence of intra-abdominal masses.  Positive bowel sounds noted in all quadrants.   Musculoskeletal: No joint deformity upper and lower extremities. Good ROM, no contractures.  Poor muscle tone noted.   Data Reviewed:  I have personally reviewed and interpreted labs,  imaging.  Significant findings are   CBC: Recent Labs  Lab 10/14/23 2237 10/16/23 0511 10/17/23 0506  WBC 26.6* 17.1* 9.4  NEUTROABS 24.8*  --  7.9*  HGB 10.3* 8.2* 7.6*  HCT 31.4* 25.2* 23.4*  MCV 94.9 96.2 96.3  PLT 412* 279 255   Basic Metabolic Panel: Recent Labs  Lab 10/14/23 2237 10/16/23 0511 10/17/23 0506  NA 135 133* 133*  K 3.9 4.0 3.6  CL 109 108 105  CO2 15* 15* 17*  GLUCOSE 123* 118* 99  BUN 60* 71* 74*  CREATININE 2.58* 2.91* 3.14*  CALCIUM 10.1 9.4 9.2  MG  --   --  2.6*   GFR: Estimated Creatinine Clearance: 13.9 mL/min (A) (by C-G formula based on SCr of 3.14 mg/dL (H)). Liver Function Tests: Recent Labs  Lab 10/14/23 2237 10/15/23 1437 10/17/23 0506  AST 24  --  19  ALT 17  --  19  ALKPHOS 157*  --  104  BILITOT 0.6  --  0.4  PROT 7.0 6.5 5.6*  ALBUMIN 2.8*  --  1.9*    Coagulation Profile: Recent Labs  Lab 10/14/23 2237  INR 1.2   Code Status:  Full code.  Code status decision has been confirmed with: patient/ wife Family Communication: Wife is at bedside who has been updated on plan of care   Severity of Illness:  The appropriate patient status for this patient is INPATIENT. Inpatient status is judged to be reasonable and necessary in order to provide the required intensity of service to ensure the patient's safety. The patient's presenting symptoms, physical exam findings, and initial radiographic and laboratory data in the context of their chronic comorbidities is  felt to place them at high risk for further clinical deterioration. Furthermore, it is not anticipated that the patient will be medically stable for discharge from the hospital within 2 midnights of admission.   * I certify that at the point of admission it is my clinical judgment that the patient will require inpatient hospital care spanning beyond 2 midnights from the point of admission due to high intensity of service, high risk for further deterioration and high  frequency of surveillance required.*  Time spent:  558 minutes  Author:  Zachary JINNY Ba MD  10/17/2023 10:22 PM

## 2023-10-17 NOTE — Assessment & Plan Note (Signed)
 Continued improvement in leukocytosis Thought to possibly be secondary to pneumonia complicated by right-sided pleural effusion Status post chest tube placement on 1/7 Presentation complicated by concurrent COVID-19 infection Continuing intravenous antibiotic therapy with Zosyn  Following pleural fluid culture and blood cultures.

## 2023-10-17 NOTE — Procedures (Signed)
 Pleural Fibrinolytic Administration Procedure Note  Wesley [name]  [id number]  [birth year]  Date:10/17/23  Time:10:37 AM   Provider Performing:Faustine Tates B Amairani Shuey   Procedure: Pleural Fibrinolysis Initial day [phone])  Indication(s) Fibrinolysis of complicated pleural effusion  Consent Risks of the procedure as well as the alternatives and risks of each were explained to the patient and/or caregiver.  Consent for the procedure was obtained.   Anesthesia None   Time Out Verified patient identification, verified procedure, site/side was marked, verified correct patient position, special equipment/implants available, medications/allergies/relevant history reviewed, required imaging and test results available.   Sterile Technique Hand hygiene, gloves   Procedure Description Existing pleural catheter was cleaned and accessed in sterile manner.  10mg  of tPA in 30cc of saline and 5mg  of dornase in 30cc of sterile water  were injected into pleural space using existing pleural catheter.  Catheter will be clamped for 1 hour and then placed back to suction.   Complications/Tolerance None; patient tolerated the procedure well.  EBL None   Specimen(s) None

## 2023-10-17 NOTE — Plan of Care (Signed)

## 2023-10-17 NOTE — Assessment & Plan Note (Signed)
 Present on admission  frequent turns Application of Mepilex to area daily

## 2023-10-17 NOTE — Progress Notes (Signed)
 NAME:  Wesley [name], MRN:  [id number], DOB:  Mar 05, 1951, LOS: 2 ADMISSION DATE:  10/14/2023, CONSULTATION DATE:  1/7 REFERRING MD:  Zella, CHIEF COMPLAINT:  recurrent loculated pleural effusion    History of Present Illness:  This is a 73 year old male w/ sig h/o squamous cell carcinoma of scalp, recurrent hydronephrosis now s/p cystoprostatectomy w/ placement of ileal conduit (aug 2024) for bladder cancer. Has been hospitalized several times for recurrent urinary tract sepsis. Also followed by Dr Neda w/ our pulm group initially for loculated right pleural effusion first noted in Oct s/p urinary procedure felt symptomatic and resolved. Also noted to have Rounded density in RLL as well as RLL nodules which have been followed in the out pt setting. His most recent f/U CT of abd/pelvis obtained during admission (dec 23) for one of his recurrent sepsis admits showed small right effusion and on-going right lower lobe rounded density as well as increase in size of RLL nodule.  He was discharged 12/26 completed abx Presented back to ER 1/6 w/ cc: fever generalized weakness  x 1d In ER wbc ct 26.6, tachycardic, scr up to 2.6. On further questioning reports some increased shortness of breath since dc last.   Cultures sent. ABX started. CXR obtained showed increased density over right hemithorax raising concern for loculated effusion  A CT chest/abd/pelvis obtained showed RLL airspace disease w/ air bronchograms. LLL opacity. Mod sized loculated right effusion. The previoulsy noted rounded right LL density and new nodularity along the left subpleural location. Also showed. 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 S/p robotic cystoprostatectomy w/ placement of ileal conduit (aug 2024) Prior loculated right pleural effusion.  Uropsepsis  Seizure disorder: Oxtellar XR  600 mg daily and Keppra  500 mg three times a day  Essential tremors  UC Prior smoker Vertigo w/ falls at home  CKD stage 4  Significant Hospital Events: Including procedures, antibiotic start and stop dates in addition to other pertinent events   1/6 admitted w/ sepsis felt UT source vs HCAP w/ complicated right pleural effusion. COVID positive. Blood cultures pending. Zosyn  started  1/7 CT obtained. Marked increase in right loculated effusion. RLL PNA vs atx stable RLL density and new nodular left pleural based changes. PCCM asked to eval >right small bore chest tube placed  1/8 No issues overnight, 650 total output in [place]  1/9 output  Interim History / Subjective:   No acute events overnight No issues with chest tube output.  Objective   Blood pressure 118/71, pulse 86, temperature 98.3 F (36.8 C), temperature source Oral, resp. rate 16, height 5' 4 (1.626 m), weight 47.7 kg, SpO2 97%.        Intake/Output Summary (Last 24 hours) at 10/17/2023 0734 Last data filed at 10/17/2023 0612 Gross per 24 hour  Intake 591.67 ml  Output 1300 ml  Net -708.33 ml   Filed Weights   10/14/23 2233 10/15/23 2200  Weight: 49.7 kg 47.7 kg    Examination: General: Acute on chronically ill appearing elderly severely deconditioned male lying in bed, in NAD HEENT: [place]/AT, MM pink/moist, PERRL,  Neuro: Alert and oriented x3, non-focal  CV: s1s2 regular rate and rhythm, no murmur, rubs, or gallops,  PULM:  Slightly diminished bilaterally, no increased work of breathing, right chest tube to suction  GI: soft, bowel sounds active in all 4 quadrants, non-tender, non-distended, tolerating oral diet Extremities: warm/dry, no edema   Skin: no rashes or lesions  Resolved Hospital Problem list     Assessment & Plan:  Severe sepsis  RLL PNA Complicated right pleural effusion RLL lung density and new Left pleural based nodular density worrisome for malignancy vs mets Metastatic Bladder cancer s/p cystoprostatectomy w/ placement of ileal conduit (aug 2024)  Chronic bilateral hydronephrosis  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  Acute on chronic (CKD stage 4) renal failure (baseline 1.86-->2.58) NAGMA Elevated alk PO4 COVID  H/o seizure d/o Chronic tremor  H/o HTN   Pulmonary consult problem list: Dyspnea secondary to pneumonia  Complicated acute on chronic right pleural effusion Right Pneumothorax, resolved  P: Continue Zosyn   Routine chest tube care  Follow chest tube output  Give plueral lytic therapy today given on going right effusion at base Daily CXR Follow cultures and cytology, confirmed the labs has plerual sample and will run cytology today  Ensure adequate pain control  Mobilize as able  Encourage pulmonary hygiene    Best Practice (right click and Reselect all SmartList Selections daily)  Per primary   Critical care time: NA    Dorn Chill, MD [facility] Office: [phone]   See Amion for personal pager PCCM on call pager [phone] until 7pm. Please call Elink 7p-7a. [phone]

## 2023-10-17 NOTE — Assessment & Plan Note (Signed)
 No clinical evidence of bleeding however hemoglobin is continuing to downtrend Possibly due to hemodilution with intravenous fluids. Anemia workup revealing folate deficiency as well as an iron  panel suggestive of iron  deficiency and anemia of chronic disease. Continue folic acid  Continue iron  supplementation. Will additionally obtain stool Hemoccult.

## 2023-10-17 NOTE — Evaluation (Signed)
 OT Cancellation Note  Patient Details Name: Wesley [name] MRN: [id number] DOB: Sep 11, 1951   Cancelled Treatment:    Reason Eval/Treat Not Completed: Fatigue/lethargy limiting ability to participate. Pt received with spouse in room, declining participation in OT at this time due to fatigue + lunch tray arriving. Pt requests OT return tomorrow for eval.   Milla Wahlberg L. Paola Aleshire, OTR/L  10/17/23, 2:50 PM

## 2023-10-17 NOTE — Assessment & Plan Note (Signed)
 Status post chest tube placement 1/7 Patient additionally underwent pleural fibrinolysis by PCCM earlier in the day today to break up loculated effusion. Awaiting pleural fluid culture and cytology Original pleural cell count appears to be transudative per lights criteria

## 2023-10-17 NOTE — Assessment & Plan Note (Signed)
 Creatinine continuing to trend upwards Renal ultrasound performed earlier this morning revealing moderate right sided hydronephrosis not seen on any prior imaging prior to this hospitalization.  Additionally, now severe left hydronephrosis also seen. In the setting of worsening renal function, case discussed with Dr. Devere with urology who is recommending IR consultation in the morning for bilateral nephrostomy tube placement.  Urology will formally see patient tomorrow morning in consultation. Trend patient off of lactated Ringer 's infusion for now. Strict input and output monitoring Monitoring renal function and electrolytes with serial chemistries

## 2023-10-17 NOTE — Assessment & Plan Note (Signed)
 COVID positive on 1/6  unclear as to how much COVID-19 is contributing to patient's presentation Airborne and contact isolation In the absence of significant hypoxia we will abstain from initiating remdesivir or barcitinib

## 2023-10-18 ENCOUNTER — Inpatient Hospital Stay (HOSPITAL_COMMUNITY): Payer: Medicare Other

## 2023-10-18 DIAGNOSIS — I10 Essential (primary) hypertension: Secondary | ICD-10-CM | POA: Diagnosis not present

## 2023-10-18 DIAGNOSIS — A419 Sepsis, unspecified organism: Secondary | ICD-10-CM | POA: Diagnosis not present

## 2023-10-18 DIAGNOSIS — U071 COVID-19: Secondary | ICD-10-CM | POA: Diagnosis not present

## 2023-10-18 DIAGNOSIS — R6520 Severe sepsis without septic shock: Secondary | ICD-10-CM | POA: Diagnosis not present

## 2023-10-18 DIAGNOSIS — N179 Acute kidney failure, unspecified: Secondary | ICD-10-CM | POA: Diagnosis not present

## 2023-10-18 HISTORY — PX: IR NEPHROSTOMY PLACEMENT RIGHT: IMG6064

## 2023-10-18 HISTORY — PX: IR NEPHROSTOMY PLACEMENT LEFT: IMG6063

## 2023-10-18 LAB — BASIC METABOLIC PANEL
Anion gap: 10 (ref 5–15)
BUN: 68 mg/dL — ABNORMAL HIGH (ref 8–23)
CO2: 18 mmol/L — ABNORMAL LOW (ref 22–32)
Calcium: 9.6 mg/dL (ref 8.9–10.3)
Chloride: 111 mmol/L (ref 98–111)
Creatinine, Ser: 2.59 mg/dL — ABNORMAL HIGH (ref 0.61–1.24)
GFR, Estimated: 25 mL/min — ABNORMAL LOW (ref >60)
Glucose, Bld: 109 mg/dL — ABNORMAL HIGH (ref 70–99)
Potassium: 4 mmol/L (ref 3.5–5.1)
Sodium: 139 mmol/L (ref 135–145)

## 2023-10-18 LAB — CBC WITH DIFFERENTIAL/PLATELET
Abs Immature Granulocytes: 0.06 10*3/uL (ref 0.00–0.07)
Basophils Absolute: 0 10*3/uL (ref 0.0–0.1)
Basophils Relative: 0 %
Eosinophils Absolute: 0 10*3/uL (ref 0.0–0.5)
Eosinophils Relative: 0 %
HCT: 27.1 % — ABNORMAL LOW (ref 39.0–52.0)
Hemoglobin: 8.6 g/dL — ABNORMAL LOW (ref 13.0–17.0)
Immature Granulocytes: 1 %
Lymphocytes Relative: 7 %
Lymphs Abs: 0.6 10*3/uL — ABNORMAL LOW (ref 0.7–4.0)
MCH: 30.2 pg (ref 26.0–34.0)
MCHC: 31.7 g/dL (ref 30.0–36.0)
MCV: 95.1 fL (ref 80.0–100.0)
Monocytes Absolute: 0.6 10*3/uL (ref 0.1–1.0)
Monocytes Relative: 7 %
Neutro Abs: 8 10*3/uL — ABNORMAL HIGH (ref 1.7–7.7)
Neutrophils Relative %: 85 %
Platelets: 311 10*3/uL (ref 150–400)
RBC: 2.85 MIL/uL — ABNORMAL LOW (ref 4.22–5.81)
RDW: 15.8 % — ABNORMAL HIGH (ref 11.5–15.5)
WBC: 9.4 10*3/uL (ref 4.0–10.5)
nRBC: 0 % (ref 0.0–0.2)

## 2023-10-18 LAB — MAGNESIUM: Magnesium: 2.6 mg/dL — ABNORMAL HIGH (ref 1.7–2.4)

## 2023-10-18 LAB — COMPREHENSIVE METABOLIC PANEL
ALT: 16 U/L (ref 0–44)
AST: 15 U/L (ref 15–41)
Albumin: 2 g/dL — ABNORMAL LOW (ref 3.5–5.0)
Alkaline Phosphatase: 95 U/L (ref 38–126)
Anion gap: 9 (ref 5–15)
BUN: 70 mg/dL — ABNORMAL HIGH (ref 8–23)
CO2: 16 mmol/L — ABNORMAL LOW (ref 22–32)
Calcium: 9.4 mg/dL (ref 8.9–10.3)
Chloride: 110 mmol/L (ref 98–111)
Creatinine, Ser: 2.53 mg/dL — ABNORMAL HIGH (ref 0.61–1.24)
GFR, Estimated: 26 mL/min — ABNORMAL LOW (ref >60)
Glucose, Bld: 115 mg/dL — ABNORMAL HIGH (ref 70–99)
Potassium: 3.9 mmol/L (ref 3.5–5.1)
Sodium: 135 mmol/L (ref 135–145)
Total Bilirubin: 0.4 mg/dL (ref 0.0–1.2)
Total Protein: 5.7 g/dL — ABNORMAL LOW (ref 6.5–8.1)

## 2023-10-18 LAB — SODIUM, URINE, RANDOM: Sodium, Ur: 49 mmol/L

## 2023-10-18 LAB — BODY FLUID CULTURE W GRAM STAIN: Culture: NO GROWTH

## 2023-10-18 LAB — URINE CULTURE: Culture: >=100000 — AB

## 2023-10-18 LAB — CREATININE, URINE, RANDOM: Creatinine, Urine: 35 mg/dL

## 2023-10-18 MED ORDER — FENTANYL CITRATE (PF) 100 MCG/2ML IJ SOLN
INTRAMUSCULAR | Status: AC
  Filled 2023-10-18: qty 2

## 2023-10-18 MED ORDER — LIDOCAINE HCL 1 % IJ SOLN
20.0000 mL | Freq: Once | INTRAMUSCULAR | Status: AC
  Administered 2023-10-18: 10 mL via INTRADERMAL

## 2023-10-18 MED ORDER — POLYETHYLENE GLYCOL 3350 17 G PO PACK
17.0000 g | PACK | Freq: Every day | ORAL | Status: DC
Start: 2023-10-18 — End: 2023-10-21
  Administered 2023-10-19 – 2023-10-20 (×2): 17 g via ORAL
  Filled 2023-10-18 (×2): qty 1

## 2023-10-18 MED ORDER — LACTATED RINGERS IV SOLN
INTRAVENOUS | Status: AC
Start: 2023-10-18 — End: 2023-10-19

## 2023-10-18 MED ORDER — SENNOSIDES-DOCUSATE SODIUM 8.6-50 MG PO TABS
2.0000 | ORAL_TABLET | Freq: Two times a day (BID) | ORAL | Status: DC
  Administered 2023-10-18 – 2023-10-20 (×4): 2 via ORAL
  Filled 2023-10-18 (×5): qty 2

## 2023-10-18 MED ORDER — FENTANYL CITRATE (PF) 100 MCG/2ML IJ SOLN
INTRAMUSCULAR | Status: AC | PRN
  Administered 2023-10-18 (×3): 25 ug via INTRAVENOUS

## 2023-10-18 MED ORDER — MIDAZOLAM HCL 2 MG/2ML IJ SOLN
INTRAMUSCULAR | Status: AC | PRN
  Administered 2023-10-18: 1 mg via INTRAVENOUS
  Administered 2023-10-18: .5 mg via INTRAVENOUS

## 2023-10-18 MED ORDER — LIDOCAINE HCL (PF) 1 % IJ SOLN
INTRAMUSCULAR | Status: AC
  Filled 2023-10-18: qty 30

## 2023-10-18 MED ORDER — LIDOCAINE HCL 1 % IJ SOLN
20.0000 mL | Freq: Once | INTRAMUSCULAR | Status: AC
  Administered 2023-10-18: 20 mL via INTRADERMAL

## 2023-10-18 MED ORDER — LIDOCAINE HCL 1 % IJ SOLN
INTRAMUSCULAR | Status: AC
  Filled 2023-10-18: qty 20

## 2023-10-18 MED ORDER — MIDAZOLAM HCL 2 MG/2ML IJ SOLN
INTRAMUSCULAR | Status: AC
  Filled 2023-10-18: qty 2

## 2023-10-18 MED ORDER — SODIUM CHLORIDE 0.9% FLUSH
5.0000 mL | Freq: Three times a day (TID) | INTRAVENOUS | Status: DC
  Administered 2023-10-18 – 2023-10-25 (×21): 5 mL

## 2023-10-18 MED ORDER — IOHEXOL 300 MG/ML  SOLN
50.0000 mL | Freq: Once | INTRAMUSCULAR | Status: AC | PRN
  Administered 2023-10-18: 25 mL

## 2023-10-18 NOTE — Assessment & Plan Note (Addendum)
 Leukocytosis resolved Thought to possibly be secondary to pneumonia complicated by right-sided pleural effusion Status post chest tube placement on 1/7, management of chest tube per PCCM, their assistance is appreciated. Presentation complicated by concurrent COVID-19 infection and what appears to be a malignant right-sided effusion. Continuing intravenous antibiotic therapy with Zosyn  per PCCM recommendations. Pleural fluid culture negative.  Cytology appears to have squamous cells concerning for metastatic malignancy per discussion between pathology and Dr. Kara.

## 2023-10-18 NOTE — Consult Note (Addendum)
 Urology Consult Note   Requesting Attending Physician:  Kenard Zachary PARAS, MD Service Providing Consult: Urology  Consulting Attending: Dr. Devere   Reason for Consult:  AoCKD and bilateral hydronephrosis   HPI: Wesley [name] is seen in consultation for reasons noted above at the request of Wesley [name], Zachary PARAS, MD. Patient is known to our practice and recently underwent robotic cystectomy with ileal conduit in treatment of stage III bladder cancer with Dr. Alvaro on 05/22/2023.  PMH also significant for moderate risk prostate cancer-s/p brachytherapy and external beam radiation completed in 2010, stage IV renal insufficiency/atrophic right kidney, malignant hydronephrosis, Hx of bulbar urethral strictures, CKD III, Klebsiella UTI and chronic pleural effusions.   Pt recently discharged home 10/03/23 following treatment for sepsis 2/2 UTI. He presented back to [facility] ED 10/13/24 with fever, tachycardia, with WBC of 26.6, elevated Scr of 2.6. He is Covid (+) His existing loculated pleural effusion had increased in size and chest tube was placed, with broad spectrum ABX and eventual fibrinolytic treatment. On assessment this morning [name] is   ------------------  Assessment:  73 y.o. male with ileal conduit and bilateral hydronephrosis.   Recommendations:  #AoCKD #Ileal conduit  Baseline Scr of around 1.6, acutely worsened with a peak of 3.14. New moderate right hydronephrosis and preexisting left side hydronephrosis was noted as severe on renal US  10/17/23. Dr. Devere reviewed overnight and has recommended bilateral PCNT. IR has been consulted.   Interval improvement in Scr to 2.5 this am following 2L LR. Adequate UOP of . Considering pt's Covid status he is scheduled for end of day in IR. Will order timed BMP and determine wether to proceed with PCNT placement or to give him some more time and rehydration. Discussed with Dr. Luverne with IR, Dr. Devere, and his primary Dr. Freada.    Urostomy clear yellow with expected mucous stranding.   Will follow along peripherally over the weekend. Please call with acute changes. Will check on him again at the beginning of the week.   #UTI Chronic Klebsiella UTI being broadly treated with Zosyn  considering his concurrent loculated pleural effusion.  His culture data has returned positive for vancomycin -resistant Enterococcus as well.  Will need additional linezolid  or Levaquin  coverage.  Case and plan discussed with Dr Devere  Past Medical History: Past Medical History:  Diagnosis Date   Arthritis    Atypical chest pain    cardiologist-- dr gerard;  chronic occasional,  pressure / discomfort, LOV 07/14/21 (as of 06/08/22)   Bladder calculus    BPH with obstruction/lower urinary tract symptoms    Follows with Dr. Valli [name], urologist.   Complex partial seizure [facility])    followed by neurologist--- dr d. 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 Gretel at [facility] neurology, LOV w/ FNP on 03/21/22 in Epic ( as of 06/08/22)   Generalized abdominal pain    w/  bloating due to UC  take doxepin    GERD (gastroesophageal reflux disease)    H/O urinary retention    History of bladder stone    History of hiatal hernia    History of kidney stones    History of prostate cancer 2010   urologist--- dr Wesley [name];   dx 2010,  Gleason 4+3, PSA 5.2;   03-04-2009  s/p radiactive prostate seed implants and external beam radiation   History of repair of hiatal hernia    02-11-2017  s/p lap paraesophageal hernia repair w/ nissen fundoplication   History of skin cancer    Hx of resection of meningioma 02/11/2009   benign right temperal lobe meningioma s/p resection  (residual hearing loss)   (neurosurgeon-- dr Wesley [name])   Hypertension    followed  by cardiologist and pcp   Insomnia    Migraine with aura and without status migrainosus, not intractable    neurologist-- dr alm poisson   Prostate cancer [facility])    Simple renal cyst    bilateral ;   followed by urologist   Ulcerative colitis [facility])    followed by dr v. shana (GI);  dx 2010;  current treatment stelara q8wks, LOV 06/07/22 (as of 06/08/22)   Wears glasses     Past Surgical History:  Past Surgical History:  Procedure Laterality Date   BOTOX  INJECTION N/A 01/29/2023   Procedure: BOTOX  INJECTION 100 UNITS;  Surgeon: Elisabeth Wesley BIRCH, MD;  Location: [facility];  Service: Urology;  Laterality: N/A;   BRAIN MENINGIOMA EXCISION  02/11/2009   @[facility] ;  craniotomy , right temporal medial sphonoid wing resection   CARPAL TUNNEL RELEASE Bilateral    right 09-05-2016  @[facility] ;   left 2013   COLONOSCOPY  12/2019   COLONOSCOPY  01/2022   CYSTOSCOPY  03/05/2012   Procedure: CYSTOSCOPY;  Surgeon: Wesley Gwenyth Brooks, MD;  Location: [facility];  Service: Urology;  Laterality: N/A;  Cysto Clot Evacuation and Fulgeration Of [place] ([place])   CYSTOSCOPY WITH INJECTION N/A 05/22/2023   Procedure: CYSTOSCOPY WITH INJECTION OF INDOCYANINE GREEN DYE;  Surgeon: Wesley [name] Ricardo KATHEE Mickey., MD;  Location: [facility];  Service: Urology;  Laterality: N/A;   CYSTOSCOPY WITH LITHOLAPAXY N/A 03/13/2019   Procedure: CYSTOSCOPY WITH lASER LITHOLAPAXY, DILATION OF STRICTURE;  Surgeon: Ottelin, Mark, MD;  Location: [facility];  Service: Urology;  Laterality: N/A;   CYSTOSCOPY WITH LITHOLAPAXY N/A 01/02/2022   Procedure: CYSTOSCOPY WITH LITHOTRIPSY;  Surgeon: Elisabeth Wesley BIRCH, MD;  Location: [facility];  Service: Urology;  Laterality: N/A;   CYSTOSCOPY WITH RETROGRADE PYELOGRAM, URETEROSCOPY AND STENT PLACEMENT Right 03/13/2022   Procedure: CYSTOSCOPY WITH RETROGRADE PYELOGRAM, URETEROSCOPY AND STENT PLACEMENT;  Surgeon: Elisabeth Wesley BIRCH, MD;  Location: [facility];  Service: Urology;  Laterality: Right;  75 MINS   CYSTOSCOPY WITH RETROGRADE PYELOGRAM, URETEROSCOPY AND STENT PLACEMENT Right 08/07/2022   Procedure: CYSTOSCOPY WITH RETROGRADE PYELOGRAM, URETEROSCOPY AND STENT PLACEMENT, REMOVAL OF NEPHROURETERAL STENT;  Surgeon: Elisabeth Wesley BIRCH, MD;  Location: [facility];  Service: Urology;  Laterality: Right;  75 MINS   CYSTOSCOPY WITH URETHRAL DILATATION N/A 08/23/2020   Procedure: CYSTOSCOPY WITH URETHRAL DILATATION, BLADDER BIOPSY, FULGERATION;  Surgeon: Elisabeth Wesley BIRCH, MD;  Location: [facility] [place];  Service: Urology;  Laterality: N/A;  1 HR   CYSTOSCOPY WITH URETHRAL DILATATION N/A 03/13/2021   Procedure: CYSTOSCOPY WITH URETHRAL BALLOON DILATATION/ FOLEY CATHETER PLACEMENT;  Surgeon: Elisabeth Wesley BIRCH, MD;  [facility];  Service: Urology;  Laterality: N/A;   CYSTOSCOPY WITH URETHRAL DILATATION Left 07/08/2009   @[facility] :   with left  RTG   CYSTOSCOPY WITH URETHRAL DILATATION N/A 06/19/2022   Procedure: CYSTOSCOPY WITH OPTILUME URETHRAL DILATATION;  Surgeon: Elisabeth Wesley BIRCH, MD;  Location: [facility] ;  Service: Urology;  Laterality: N/A;   CYSTOSCOPY/URETEROSCOPY/HOLMIUM LASER/STENT PLACEMENT Bilateral 01/29/2023   Procedure:  DIAGNOSTIC CYSTOSCOPY , LEFT RETROGRADE PYELOGRAM, LEFT URETERAL STENT PLACEMENT, AND STONE BASKETING;  Surgeon: Elisabeth Wesley BIRCH, MD;  Location: [facility] [place];  Service: Urology;  Laterality: Bilateral;   HOLMIUM LASER APPLICATION N/A 08/23/2020   Procedure: HOLMIUM LASER APPLICATION OF URETHRAL STONE;  Surgeon: Elisabeth Wesley BIRCH, MD;  Location: [facility];  Service: Urology;  Laterality: N/A;   HOLMIUM LASER APPLICATION Right 03/13/2022   Procedure: HOLMIUM LASER APPLICATION;  Surgeon: Elisabeth Wesley BIRCH, MD;  Location: [facility];  Service: Urology;  Laterality: Right;   HOLMIUM LASER APPLICATION Right 08/07/2022   Procedure: HOLMIUM LASER  APPLICATION;  Surgeon: Elisabeth Wesley BIRCH, MD;  [facility];  Service: Urology;  Laterality: Right;   INGUINAL HERNIA REPAIR Right 05/03/2015   @[facility]    INSERTION PROSTATE RADIATION SEED  03/04/2009   @[facility]    IR NEPHROSTOMY PLACEMENT RIGHT  07/18/2022   IR URETERAL STENT RIGHT NEW ACCESS W/O SEP NEPHROSTOMY CATH  02/04/2023   KNEE CARTILAGE SURGERY Left    1980S   LAPAROSCOPIC PARAESOPHAGEAL HERNIA REPAIR  02/11/2017   @[facility] ;  W/  NISSEN FUNDOPLICATION (MESH)   LYMPH NODE DISSECTION Bilateral 05/22/2023   Procedure: LYMPH NODE DISSECTION;  Surgeon: Wesley [name] Ricardo KATHEE Mickey., MD;  Location[facility];  Service: Urology;  Laterality: Bilateral;   NEPHROLITHOTOMY Right 03/29/2023   Procedure: NEPHROLITHOTOMY PERCUTANEOUS;  Surgeon: Wesley [name] Ricardo KATHEE Mickey., MD;  Location[facility];  Service: Urology;  Laterality: Right;  2 HRS   PROSTATE BIOPSY     ROBOT ASSISTED LAPAROSCOPIC COMPLETE CYSTECT ILEAL CONDUIT N/A 05/22/2023   Procedure: XI ROBOTIC ASSISTED LAPAROSCOPIC COMPLETE CYSTECT ILEAL CONDUIT;  Surgeon: Wesley [name] Ricardo KATHEE Mickey., MD;  [facility];  Service: Urology;  Laterality: N/A;   ROBOT ASSISTED LAPAROSCOPIC RADICAL PROSTATECTOMY N/A 05/22/2023   Procedure: XI ROBOTIC ASSISTED LAPAROSCOPIC RADICAL PROSTATECTOMY;  Surgeon: Wesley [name] Ricardo KATHEE Mickey., MD;  Location[facility];  Service: Urology;  Laterality: N/A;   TOE SURGERY     TRANSURETHRAL RESECTION OF BLADDER TUMOR N/A 01/02/2022   Procedure: TRANSURETHRAL RESECTION OF BLADDER TUMOR (TURBT);  Surgeon: Elisabeth Wesley BIRCH, MD;  Location: [facility];  Service: Urology;  Laterality: N/A;   TRANSURETHRAL RESECTION OF BLADDER TUMOR N/A 01/29/2023   Procedure: TRANSURETHRAL RESECTION OF BLADDER TUMOR (TURBT);  Surgeon: Elisabeth Wesley BIRCH, MD;  Location: [facility];  Service: Urology;  Laterality: N/A;    Medication: Current Facility-Administered Medications  Medication Dose Route Frequency Provider Last Rate Last Admin    acetaminophen  (TYLENOL ) tablet 650 mg  650 mg Oral Q6H PRN Zella, Mir M, MD   650 mg at 10/16/23 2157   Or   acetaminophen  (TYLENOL ) suppository 650 mg  650 mg Rectal Q6H PRN Zella, Mir M, MD       amLODipine  (NORVASC ) tablet 5 mg  5 mg Oral QHS Zella, Mir M, MD   5 mg at 10/17/23 2223   doxepin  (SINEQUAN ) capsule 25 mg  25 mg Oral QHS Zella, Mir M, MD   25 mg at 10/17/23 2224   feeding supplement (ENSURE ENLIVE / ENSURE PLUS) liquid 237 mL  237 mL Oral BID BM Zella, Mir M, MD   237 mL at 10/17/23 1325   fluticasone  furoate-vilanterol (BREO ELLIPTA ) 100-25 MCG/ACT 1 puff  1 puff Inhalation Daily Zella, Mir M, MD   1 puff at 10/17/23 0753   folic acid  (FOLVITE ) tablet 1 mg  1 mg Oral Daily Wesley [name], Zachary PARAS, MD  1 mg at 10/17/23 9176   heparin  injection 5,000 Units  5,000 Units Subcutaneous Q8H Zella, Mir M, MD   5,000 Units at 10/18/23 9361   levETIRAcetam  (KEPPRA ) tablet 500 mg  500 mg Oral BID Zella, Mir M, MD   500 mg at 10/17/23 2223   linaclotide  (LINZESS ) capsule 145 mcg  145 mcg Oral Daily Zella, Mir M, MD   145 mcg at 10/17/23 9176   meclizine  (ANTIVERT ) tablet 25 mg  25 mg Oral Q6H PRN Zella Katha HERO, MD       multivitamin with minerals tablet 1 tablet  1 tablet Oral Daily Wesley [name], Zachary PARAS, MD   1 tablet at 10/17/23 1557   ondansetron  (ZOFRAN ) tablet 4 mg  4 mg Oral Q6H PRN Zella Katha HERO, MD       Or   ondansetron  (ZOFRAN ) injection 4 mg  4 mg Intravenous Q6H PRN Zella Katha HERO, MD       OXcarbazepine  ER TB24 600 mg  600 mg Oral Q1500 Ikramullah, Mir M, MD   600 mg at 10/17/23 1748   oxyCODONE -acetaminophen  (PERCOCET/ROXICET) 5-325 MG per tablet 1-2 tablet  1-2 tablet Oral Q6H PRN Babcock, Peter E, NP   2 tablet at 10/17/23 1617   piperacillin -tazobactam (ZOSYN ) IVPB 2.25 g  2.25 g Intravenous Q6H Gadhia, Jigna M, RPH 100 mL/hr at 10/18/23 0130 2.25 g at 10/18/23 0130   polyethylene glycol (MIRALAX  / GLYCOLAX ) packet 17 g  17 g  Oral Daily PRN Zella Katha HERO, MD       primidone  (MYSOLINE ) tablet 100 mg  100 mg Oral QHS Zella, Mir M, MD   100 mg at 10/17/23 2224   senna (SENOKOT) tablet 17.2 mg  2 tablet Oral QHS Kenard Zachary PARAS, MD   17.2 mg at 10/17/23 2223   sodium chloride  flush (NS) 0.9 % injection 10 mL  10 mL Intrapleural Q8H Babcock, Peter E, NP   10 mL at 10/18/23 9360   sodium chloride  flush (NS) 0.9 % injection 10 mL  10 mL Intrapleural Q8H Kara Carrier B, MD   10 mL at 10/18/23 0131   traZODone  (DESYREL ) tablet 25 mg  25 mg Oral QHS PRN Zella Katha HERO, MD   25 mg at 10/16/23 2156   Facility-Administered Medications Ordered in Other Encounters  Medication Dose Route Frequency Provider Last Rate Last Admin   magnesium  citrate solution 1 Bottle  1 Bottle Oral Once Manny, Theodore B Jr., MD       magnesium  citrate solution 1 Bottle  1 Bottle Oral Once Manny, Ricardo KATHEE Raddle., MD        Allergies: Allergies  Allergen Reactions   Sulfa Antibiotics Rash    Childhood    Social History: Social History   Tobacco Use   Smoking status: Former    Current packs/day: 0.00    Average packs/day: 1 pack/day for 10.0 years (10.0 ttl pk-yrs)    Types: Cigarettes    Start date: 03/01/1977    Quit date: 03/02/1987    Years since quitting: 36.6   Smokeless tobacco: Never  Vaping Use   Vaping status: Never Used  Substance Use Topics   Alcohol use: Yes    Comment: Rare   Drug use: Never    Family History History reviewed. No pertinent family history.  Review of Systems  Genitourinary:  Negative for dysuria, flank pain, frequency, hematuria and urgency.     Objective   Vital signs in last 24 hours: BP 114/79 (BP  Location: Left Arm)   Pulse 97   Temp 97.6 F (36.4 C) (Oral)   Resp 16   Ht 5' 4 (1.626 m)   Wt 47.7 kg   SpO2 95%   BMI 18.05 kg/m   Physical Exam General: A&O, resting, appropriate, chronically ill, very decompensated.  HEENT: [place]/AT Pulmonary: Normal work of  breathing, right chest tube Cardiovascular: no cyanosis GU: Urostomy beefy pink, clear yellow urine with mucous stranding. Neuro: Appropriate, no focal neurological deficits  Most Recent Labs: Lab Results  Component Value Date   WBC 9.4 10/18/2023   HGB 8.6 (L) 10/18/2023   HCT 27.1 (L) 10/18/2023   PLT 311 10/18/2023    Lab Results  Component Value Date   NA 135 10/18/2023   K 3.9 10/18/2023   CL 110 10/18/2023   CO2 16 (L) 10/18/2023   BUN 70 (H) 10/18/2023   CREATININE 2.53 (H) 10/18/2023   CALCIUM 9.4 10/18/2023   MG 2.6 (H) 10/18/2023    Lab Results  Component Value Date   INR 1.2 10/14/2023   APTT 40 (H) 10/14/2023     Urine Culture: @[id number] (laburin,org,[id number])@   IMAGING: DG CHEST PORT 1 VIEW Result Date: 10/18/2023 CLINICAL DATA:  [id number] with pleural effusion and chest tube in place. EXAM: PORTABLE CHEST 1 VIEW COMPARISON:  Portable chest yesterday at 5:17 a.m. FINDINGS: 4:58 a.m. pigtail right chest tube remains with the pigtail superimposing in the lateral lower chest alongside the elevated hemidiaphragm. There is no measurable pneumothorax. Small right pleural effusion has reaccumulated with adjacent hazy atelectasis or consolidation. The left lung and right upper lung fields are clear. The cardiomediastinal silhouette is stable. There is aortic atherosclerosis. No vascular congestion is seen. No new osseous findings. Thoracic spondylosis and DDD. IMPRESSION: 1. Small right pleural effusion has reaccumulated with adjacent hazy atelectasis or consolidation. 2. Right chest tube remains in place. No measurable pneumothorax. 3. Aortic atherosclerosis. Electronically Signed   By: Francis Quam M.D.   On: 10/18/2023 06:57   DG CHEST PORT 1 VIEW Result Date: 10/17/2023 CLINICAL DATA:  Status post right chest tube placement for loculated right pleural effusion. EXAM: PORTABLE CHEST 1 VIEW COMPARISON:  10/16/2023 FINDINGS: The heart size and mediastinal contours  are within normal limits. Stable positioning right pleural drainage catheter. No significant residual pleural fluid. Stable mild atelectasis at the right lung base with elevation of the right hemidiaphragm. No pneumothorax or pulmonary edema. The visualized skeletal structures are unremarkable. IMPRESSION: Stable positioning of right pleural drainage catheter. No significant residual pleural fluid. Stable mild atelectasis at the right lung base with elevation of the right hemidiaphragm. Electronically Signed   By: Marcey Moan M.D.   On: 10/17/2023 09:28   US  RENAL Result Date: 10/17/2023 CLINICAL DATA:  [id number]. Renal failure. Prior cystectomy with right lower quadrant urostomy. Surgery date 05/22/2023. EXAM: RENAL / URINARY TRACT ULTRASOUND COMPLETE COMPARISON:  CT without contrast 10/15/2023, renal ultrasound 07/24/2023 FINDINGS: Right Kidney: Renal measurements: Atrophic measuring 5.2 x 5.0 x 4.1 cm = volume: 55.9 mL, previously 69.6 mL. The cortex is thin and echogenic as before. There is moderate hydronephrosis which was seen on the CT yesterday but was not seen on the ultrasound from 07/24/2023. 2 cm anechoic cyst in the upper pole is again shown. No mass is evident and no visible stones. Left Kidney: Renal measurements: 10.0 x 7.0 x 7.4 cm = volume: 275.8 mL. The renal cortex again is mildly echogenic but no more than previously. Severe hydronephrosis is again noted  and was present on both prior studies. There is hypoechoic debris within some of the dilated calices which could be infectious or hemorrhagic debris. Correlate with urinalysis. There are multiple anechoic cysts, largest is 6.7 cm, as before. No complex cyst is seen. No visible stones. Bladder: Surgically absent. Other: None. IMPRESSION: 1. Moderate right hydronephrosis which was seen on the CT yesterday but was not seen on the ultrasound from 07/24/2023. 2. Severe left hydronephrosis which was present on both prior studies. There is  hypoechoic debris within some of the dilated calices which could be infectious or hemorrhagic debris. Correlate with urinalysis. 3. Atrophic right kidney with thin and echogenic cortex as before. Mild but unchanged increased echogenicity of the left kidney. 4. Bilateral renal cysts. 5. Prior cystectomy with right lower quadrant urostomy. Electronically Signed   By: Francis Quam M.D.   On: 10/17/2023 02:14    ------  Ole Bourdon, NP Pager: [phone]   Please contact the urology consult pager with any further questions/concerns.

## 2023-10-18 NOTE — Assessment & Plan Note (Addendum)
 Creatinine show marginal improvement That being said, we do not anticipate that renal function will recover without intervention with nephrostomy tubes.  Per discussion with urology and with family they wished to proceed.  Therefore Dr. Luverne with interventional radiology was consulted and graciously proceeded with bilateral nephrostomy tube placement this evening. Will continue to gently intravenously hydrate Strict input and output monitoring Monitoring renal function and electrolytes with serial chemistries

## 2023-10-18 NOTE — Assessment & Plan Note (Addendum)
 No clinical evidence of bleeding however hemoglobin is continuing to downtrend Possibly due to hemodilution with intravenous fluids. Anemia workup revealing folate deficiency as well as an iron  panel suggestive of iron  deficiency and anemia of chronic disease. Continue folic acid  supplementation Continue iron  supplementation.

## 2023-10-18 NOTE — Assessment & Plan Note (Signed)
 Continue amlodipine As needed intravenous hypertensives for markedly elevated blood pressure

## 2023-10-18 NOTE — Assessment & Plan Note (Signed)
 Continue Keppra

## 2023-10-18 NOTE — Assessment & Plan Note (Signed)
 Present on admission  frequent turns Application of Mepilex to area daily

## 2023-10-18 NOTE — Consult Note (Signed)
 Chief Complaint: Patient was seen in consultation today for bilateral percutaneous nephrostomies Chief Complaint  Patient presents with   Shortness of Breath    Referring Physician(s): Sattenfield,C  Supervising Physician: Luverne Aran  Patient Status: [facility] - In-pt  History of Present Illness: Wesley [name] is a 73 y.o. male with past medical history significant for arthritis, BPH, hyperlipidemia, GERD, cholelithiasis, prostate cancer, skin cancer, meningioma resection 2010 hypertension, ulcerative colitis, as well as metastatic urothelial cancer. He is status post robotic cystoprostatectomy with ileal conduit 05/22/2023.  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 He has undergone prior nephroureteral drain by our team in 2023 by our team for management of nephrolithiasis and right nephrostomy placement on 02/04/2023 secondary to right distal UVJ obstruction.  He was admitted to [facility] on 1/6 with fever, tachycardia, leukocytosis, elevated serum creatinine to 2.6 and COVID-positive.  Due to increasing size of loculated right pleural effusion a chest tube was placed by CCM on 1/7.  Patient's creatinine remains elevated today at 2.59.  He desires palliative chemotherapy but is not a candidate at this time due to poor functional status.  Bilateral PCNs are now requested for decompression to see if creatinine will lower to allow for chemo.  Past Medical History:  Diagnosis Date   Arthritis    Atypical chest pain    cardiologist-- dr gerard;  chronic occasional,  pressure / discomfort, LOV 07/14/21 (as of 06/08/22)   Bladder calculus    BPH with obstruction/lower urinary tract symptoms    Follows with Dr. Valli Shank, urologist.   Complex partial seizure [facility])    followed by neurologist--- dr d. 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 Gretel at [facility] neurology, LOV w/ FNP on 03/21/22 in Epic ( as of 06/08/22)   Generalized abdominal pain    w/  bloating due to UC  take doxepin    GERD (gastroesophageal reflux disease)    H/O urinary retention    History of bladder stone    History of hiatal hernia    History of kidney stones    History of prostate cancer 2010   urologist--- dr pace;   dx 2010,  Gleason 4+3, PSA 5.2;   03-04-2009  s/p radiactive prostate seed implants and external beam radiation   History of repair of hiatal hernia    02-11-2017  s/p lap paraesophageal hernia repair w/ nissen fundoplication   History of skin cancer    Hx of resection of meningioma 02/11/2009   benign right temperal lobe meningioma s/p resection  (residual hearing loss)   (neurosurgeon-- dr madilyn)   Hypertension    followed by cardiologist and pcp   Insomnia    Migraine with aura and without status migrainosus, not intractable    neurologist-- dr alm gretel   Prostate cancer [facility])    Simple renal cyst    bilateral ;   followed by urologist   Ulcerative colitis [facility])    followed by dr v. shana (GI);  dx 2010;  current treatment stelara q8wks, LOV 06/07/22 (as of 06/08/22)   Wears glasses     Past Surgical History:  Procedure Laterality Date   BOTOX   INJECTION N/A 01/29/2023   Procedure: BOTOX  INJECTION 100 UNITS;  Surgeon: Elisabeth Valli BIRCH, MD;  Location: [facility];  Service: Urology;  Laterality: N/A;   BRAIN MENINGIOMA EXCISION  02/11/2009   @[facility] ;  craniotomy , right temporal medial sphonoid wing resection   CARPAL TUNNEL RELEASE Bilateral    right 09-05-2016  @[facility] ;   left 2013   COLONOSCOPY  12/2019   COLONOSCOPY  01/2022   CYSTOSCOPY  03/05/2012   Procedure: CYSTOSCOPY;  Surgeon: Donnice Gwenyth Brooks, MD;  Location: [facility];  Service: Urology;  Laterality: N/A;  Cysto Clot Evacuation and Fulgeration Of [place] ([place])   CYSTOSCOPY WITH INJECTION N/A 05/22/2023   Procedure: CYSTOSCOPY WITH INJECTION OF INDOCYANINE GREEN DYE;  Surgeon: Alvaro Ricardo KATHEE Mickey., MD;  Location: [facility];  Service: Urology;  Laterality: N/A;   CYSTOSCOPY WITH LITHOLAPAXY N/A 03/13/2019   Procedure: CYSTOSCOPY WITH lASER LITHOLAPAXY, DILATION OF STRICTURE;  Surgeon: Ottelin, Mark, MD;  Location: [facility];  Service: Urology;  Laterality: N/A;   CYSTOSCOPY WITH LITHOLAPAXY N/A 01/02/2022   Procedure: CYSTOSCOPY WITH LITHOTRIPSY;  Surgeon: Elisabeth Valli BIRCH, MD;  Location: [facility];  Service: Urology;  Laterality: N/A;   CYSTOSCOPY WITH RETROGRADE PYELOGRAM, URETEROSCOPY AND STENT PLACEMENT Right 03/13/2022   Procedure: CYSTOSCOPY WITH RETROGRADE PYELOGRAM, URETEROSCOPY AND STENT PLACEMENT;  Surgeon: Elisabeth Valli BIRCH, MD;  Location: [facility];  Service: Urology;  Laterality: Right;  75 MINS   CYSTOSCOPY WITH RETROGRADE PYELOGRAM, URETEROSCOPY AND STENT PLACEMENT Right 08/07/2022   Procedure: CYSTOSCOPY WITH RETROGRADE PYELOGRAM, URETEROSCOPY AND STENT PLACEMENT, REMOVAL OF NEPHROURETERAL STENT;  Surgeon: Elisabeth Valli BIRCH, MD;  Location: [facility];  Service: Urology;  Laterality: Right;  75 MINS   CYSTOSCOPY WITH URETHRAL DILATATION N/A 08/23/2020   Procedure: CYSTOSCOPY WITH URETHRAL DILATATION, BLADDER BIOPSY, FULGERATION;  Surgeon: Elisabeth Valli BIRCH, MD;  Location: [facility];  Service: Urology;  Laterality: N/A;  1 HR   CYSTOSCOPY WITH URETHRAL DILATATION N/A 03/13/2021   Procedure: CYSTOSCOPY WITH URETHRAL BALLOON DILATATION/ FOLEY CATHETER PLACEMENT;  Surgeon: Elisabeth Valli BIRCH, MD;  [facility];  Service: Urology;  Laterality: N/A;   CYSTOSCOPY WITH URETHRAL DILATATION Left 07/08/2009   @[facility] :   with left  RTG   CYSTOSCOPY WITH  URETHRAL DILATATION N/A 06/19/2022   Procedure: CYSTOSCOPY WITH OPTILUME URETHRAL DILATATION;  Surgeon: Elisabeth Valli BIRCH, MD;  Location: [facility];  Service: Urology;  Laterality: N/A;   CYSTOSCOPY/URETEROSCOPY/HOLMIUM LASER/STENT PLACEMENT Bilateral 01/29/2023   Procedure: DIAGNOSTIC CYSTOSCOPY , LEFT RETROGRADE PYELOGRAM, LEFT URETERAL STENT PLACEMENT, AND STONE BASKETING;  Surgeon: Elisabeth Valli BIRCH, MD;  Location: [facility];  Service: Urology;  Laterality: Bilateral;   HOLMIUM LASER APPLICATION N/A 08/23/2020   Procedure: HOLMIUM LASER APPLICATION OF URETHRAL STONE;  Surgeon: Elisabeth Valli BIRCH, MD;  Location: [facility];  Service: Urology;  Laterality: N/A;   HOLMIUM LASER APPLICATION Right 03/13/2022   Procedure: HOLMIUM LASER APPLICATION;  Surgeon: Elisabeth Valli BIRCH, MD;  Location: [facility];  Service: Urology;  Laterality: Right;   HOLMIUM LASER APPLICATION Right 08/07/2022   Procedure: HOLMIUM LASER APPLICATION;  Surgeon: Elisabeth Valli BIRCH, MD;  [facility];  Service: Urology;  Laterality: Right;   INGUINAL HERNIA REPAIR Right 05/03/2015   @[facility]    INSERTION PROSTATE RADIATION SEED  03/04/2009   @[facility]    IR NEPHROSTOMY PLACEMENT RIGHT  07/18/2022   IR URETERAL STENT RIGHT NEW ACCESS W/O SEP NEPHROSTOMY CATH  02/04/2023   KNEE CARTILAGE SURGERY Left    1980S   LAPAROSCOPIC PARAESOPHAGEAL HERNIA REPAIR  02/11/2017   @[facility] ;  W/  NISSEN FUNDOPLICATION (MESH)   LYMPH NODE DISSECTION Bilateral 05/22/2023   Procedure: LYMPH NODE DISSECTION;  Surgeon: Alvaro Ricardo KATHEE Mickey., MD;  Location[facility];  Service: Urology;  Laterality: Bilateral;   NEPHROLITHOTOMY Right 03/29/2023   Procedure: NEPHROLITHOTOMY PERCUTANEOUS;  Surgeon: Alvaro Ricardo KATHEE Mickey., MD;  Location[facility];  Service: Urology;  Laterality: Right;  2 HRS   PROSTATE BIOPSY     ROBOT ASSISTED LAPAROSCOPIC COMPLETE CYSTECT ILEAL CONDUIT N/A 05/22/2023   Procedure: XI  ROBOTIC ASSISTED LAPAROSCOPIC COMPLETE CYSTECT ILEAL CONDUIT;  Surgeon: Alvaro Ricardo KATHEE Mickey., MD;  [facility];  Service: Urology;  Laterality: N/A;   ROBOT ASSISTED LAPAROSCOPIC RADICAL PROSTATECTOMY N/A 05/22/2023   Procedure: XI ROBOTIC ASSISTED LAPAROSCOPIC RADICAL PROSTATECTOMY;  Surgeon: Alvaro Ricardo KATHEE Mickey., MD;  Location[facility];  Service: Urology;  Laterality: N/A;   TOE SURGERY     TRANSURETHRAL RESECTION OF BLADDER TUMOR N/A 01/02/2022   Procedure: TRANSURETHRAL RESECTION OF BLADDER TUMOR (TURBT);  Surgeon: Elisabeth Valli BIRCH, MD;  Location[facility];  Service: Urology;  Laterality: N/A;   TRANSURETHRAL RESECTION OF BLADDER TUMOR N/A 01/29/2023   Procedure: TRANSURETHRAL RESECTION OF BLADDER TUMOR (TURBT);  Surgeon: Elisabeth Valli BIRCH, MD;  Location: [facility];  Service: Urology;  Laterality: N/A;    Allergies: Sulfa antibiotics  Medications: Prior to Admission medications   Medication Sig Start Date End Date Taking? Authorizing Provider  amLODipine  (NORVASC ) 5 MG tablet Take 5 mg by mouth at bedtime.   Yes [provider]  BREO ELLIPTA  100-25 MCG/ACT AEPB INHALE 1 PUFF BY MOUTH EVERY DAY Patient taking differently: Inhale 1 puff into the lungs daily. 06/25/23  Yes Olalere, Adewale A, MD  celecoxib (CELEBREX) 100 MG capsule Take 100 mg by mouth every evening.   Yes [provider]  doxepin  (SINEQUAN ) 25 MG capsule Take 25 mg by mouth at bedtime.   Yes [provider]  ferrous sulfate  325 (65 FE) MG tablet Take 325 mg by mouth every other day.   Yes [provider]  finasteride  (PROSCAR ) 5 MG tablet Take 5 mg by mouth daily at 12 noon. 02/05/13  Yes [provider]  ketoconazole (NIZORAL) 2 % cream Apply 1 Application topically daily as needed for irritation.   Yes [provider]  ketoconazole (NIZORAL) 2 % shampoo Apply 1 Application topically daily as needed (yeast).   Yes [provider]  levETIRAcetam  (KEPPRA ) 500 MG tablet Take 500 mg by mouth 3 (three) times daily.   Yes [provider]  linaclotide  (LINZESS ) 145 MCG CAPS capsule Take 145 mcg by mouth daily. 09/26/23 03/24/24 Yes [provider]  mirtazapine  (REMERON ) 15 MG tablet Take 1 tablet (15 mg total) by mouth at bedtime. 09/23/23  Yes Olalere, Adewale A, MD  OXcarbazepine  ER (OXTELLAR XR ) 600 MG TB24 Take 600 mg by mouth daily in the afternoon.   Yes [provider]  polyethylene glycol (MIRALAX  / GLYCOLAX ) 17 g packet Take 17 g by mouth 2 (two) times daily. Patient taking differently: Take 17 g by mouth daily as needed for moderate constipation. 07/25/23  Yes Cheryle Page, MD  primidone  (MYSOLINE ) 50 MG tablet Take 100 mg by mouth at bedtime.   Yes [provider]  vitamin B-12 (CYANOCOBALAMIN) 500 MCG tablet Take 500 mcg by mouth daily.  Yes [provider]  Meclizine  HCl 25 MG CHEW Chew 1 tablet by mouth every 6 (six) hours as needed (dizziness). Patient not taking: Reported on 10/15/2023    [provider]  ustekinumab (STELARA) 90 MG/ML SOSY injection Inject 90 mg into the skin every 8 (eight) weeks.    [provider]     History reviewed. No pertinent family history.  Social History   Socioeconomic History   Marital status: Married    Spouse name: Not on file   Number of children: Not on file   Years of education: Not on file   Highest education level: Not on file  Occupational History   Not on file  Tobacco Use   Smoking status: Former    Current packs/day: 0.00    Average packs/day: 1 pack/day for 10.0 years (10.0 ttl pk-yrs)    Types: Cigarettes    Start date: 03/01/1977    Quit date: 03/02/1987    Years since quitting: 36.6   Smokeless tobacco: Never  Vaping Use   Vaping status: Never Used  Substance and Sexual Activity   Alcohol use: Yes    Comment: Rare   Drug use: Never   Sexual activity: Not Currently  Other  Topics Concern   Not on file  Social History Narrative   Not on file   Social Drivers of Health   Financial Resource Strain: Low Risk  (09/26/2023)   Received from [facility]   Overall Financial Resource Strain (CARDIA)    Difficulty of Paying Living Expenses: Not hard at all  Food Insecurity: No Food Insecurity (10/15/2023)   Hunger Vital Sign    Worried About Running Out of Food in the Last Year: Never true    Ran Out of Food in the Last Year: Never true  Transportation Needs: No Transportation Needs (10/15/2023)   PRAPARE - Administrator, Civil Service (Medical): No    Lack of Transportation (Non-Medical): No  Physical Activity: Insufficiently Active (09/26/2023)   Received from [facility]   Exercise Vital Sign    Days of Exercise per Week: 2 days    Minutes of Exercise per Session: 30 min  Stress: No Stress Concern Present (09/26/2023)   Received from [facility] of Occupational Health - Occupational Stress Questionnaire    Feeling of Stress : Not at all  Social Connections: Socially Integrated (10/15/2023)   Social Connection and Isolation Panel [NHANES]    Frequency of Communication with Friends and Family: More than three times a week    Frequency of Social Gatherings with Friends and Family: Once a week    Attends Religious Services: More than 4 times per year    Active Member of [employer] or Organizations: Yes    Attends Engineer, Structural: More than 4 times per year    Marital Status: Married      Review of Systems currently denies fever, headache, dyspnea, cough, back pain, nausea, vomiting or bleeding.  Does have some soreness at right chest drain site, some left lower quadrant pain at previous drain insertion site; he remains weak, and tachycardic.  Vital Signs: BP 120/76 (BP Location: Left Arm)   Pulse 97   Temp 98.1 F (36.7 C) (Oral)   Resp 16   Ht 5' 4 (1.626 m)   Wt 105 lb 2.6 oz (47.7 kg)   SpO2 95%   BMI  18.05 kg/m   Advance Care Plan: No documents on file.  Physical Exam: Patient is awake but extremely weak, will answer questions okay.  Wife in room.  Chest with distant breath sounds bilaterally.  Intact right chest drain connected to Pleur-evac.  Heart with tachycardic but regular rhythm.  Abdomen soft, urostomy in place, subcutaneous nodule left lower quadrant at site of previous drain placement tender to palpation; no lower extremity edema.  Imaging: DG CHEST PORT 1 VIEW Result Date: 10/18/2023 CLINICAL DATA:  [id number] with pleural effusion and chest tube in place. EXAM: PORTABLE CHEST 1 VIEW COMPARISON:  Portable chest yesterday at 5:17 a.m. FINDINGS: 4:58 a.m. pigtail right chest tube remains with the pigtail superimposing in the lateral lower chest alongside the elevated hemidiaphragm. There is no measurable pneumothorax. Small right pleural effusion has reaccumulated with adjacent hazy atelectasis or consolidation. The left lung and right upper lung fields are clear. The cardiomediastinal silhouette is stable. There is aortic atherosclerosis. No vascular congestion is seen. No new osseous findings. Thoracic spondylosis and DDD. IMPRESSION: 1. Small right pleural effusion has reaccumulated with adjacent hazy atelectasis or consolidation. 2. Right chest tube remains in place. No measurable pneumothorax. 3. Aortic atherosclerosis. Electronically Signed   By: Francis Quam M.D.   On: 10/18/2023 06:57   DG CHEST PORT 1 VIEW Result Date: 10/17/2023 CLINICAL DATA:  Status post right chest tube placement for loculated right pleural effusion. EXAM: PORTABLE CHEST 1 VIEW COMPARISON:  10/16/2023 FINDINGS: The heart size and mediastinal contours are within normal limits. Stable positioning right pleural drainage catheter. No significant residual pleural fluid. Stable mild atelectasis at the right lung base with elevation of the right hemidiaphragm. No pneumothorax or pulmonary edema. The visualized skeletal  structures are unremarkable. IMPRESSION: Stable positioning of right pleural drainage catheter. No significant residual pleural fluid. Stable mild atelectasis at the right lung base with elevation of the right hemidiaphragm. Electronically Signed   By: Marcey Moan M.D.   On: 10/17/2023 09:28   US  RENAL Result Date: 10/17/2023 CLINICAL DATA:  [id number]. Renal failure. Prior cystectomy with right lower quadrant urostomy. Surgery date 05/22/2023. EXAM: RENAL / URINARY TRACT ULTRASOUND COMPLETE COMPARISON:  CT without contrast 10/15/2023, renal ultrasound 07/24/2023 FINDINGS: Right Kidney: Renal measurements: Atrophic measuring 5.2 x 5.0 x 4.1 cm = volume: 55.9 mL, previously 69.6 mL. The cortex is thin and echogenic as before. There is moderate hydronephrosis which was seen on the CT yesterday but was not seen on the ultrasound from 07/24/2023. 2 cm anechoic cyst in the upper pole is again shown. No mass is evident and no visible stones. Left Kidney: Renal measurements: 10.0 x 7.0 x 7.4 cm = volume: 275.8 mL. The renal cortex again is mildly echogenic but no more than previously. Severe hydronephrosis is again noted and was present on both prior studies. There is hypoechoic debris within some of the dilated calices which could be infectious or hemorrhagic debris. Correlate with urinalysis. There are multiple anechoic cysts, largest is 6.7 cm, as before. No complex cyst is seen. No visible stones. Bladder: Surgically absent. Other: None. IMPRESSION: 1. Moderate right hydronephrosis which was seen on the CT yesterday but was not seen on the ultrasound from 07/24/2023. 2. Severe left hydronephrosis which was present on both prior studies. There is hypoechoic debris within some of the dilated calices which could be infectious or hemorrhagic debris. Correlate with urinalysis. 3. Atrophic right kidney with thin and echogenic cortex as before. Mild but unchanged increased echogenicity of the left kidney. 4. Bilateral renal  cysts. 5. Prior cystectomy with right lower  quadrant urostomy. Electronically Signed   By: Francis Quam M.D.   On: 10/17/2023 02:14   DG Chest Port 1 View Result Date: 10/16/2023 CLINICAL DATA:  Pleural effusion. EXAM: PORTABLE CHEST 1 VIEW COMPARISON:  October 15, 2023. FINDINGS: Right-sided chest tube is again noted.  No pneumothorax is noted. IMPRESSION: No definite pneumothorax seen currently. Right-sided chest tube is again noted. Electronically Signed   By: Lynwood Landy Raddle M.D.   On: 10/16/2023 09:18   DG Chest Port 1 View Result Date: 10/15/2023 CLINICAL DATA:  Thoracostomy placement EXAM: PORTABLE CHEST 1 VIEW COMPARISON:  10/14/2023 FINDINGS: Chest tube in place on the right in the inferior pleural space. Moderate right pneumothorax, estimated at 30%. No tension. Volume loss persists at the left lung base. IMPRESSION: Chest tube in place on the right. Moderate right pneumothorax, estimated at 30%. No tension. Electronically Signed   By: Oneil Officer M.D.   On: 10/15/2023 14:51   CT CHEST ABDOMEN PELVIS WO CONTRAST Result Date: 10/15/2023 CLINICAL DATA:  Set CIS, elevated white count. EXAM: CT CHEST, ABDOMEN AND PELVIS WITHOUT CONTRAST TECHNIQUE: Multidetector CT imaging of the chest, abdomen and pelvis was performed following the standard protocol without IV contrast. RADIATION DOSE REDUCTION: This exam was performed according to the departmental dose-optimization program which includes automated exposure control, adjustment of the mA and/or kV according to patient size and/or use of iterative reconstruction technique. COMPARISON:  CT abdomen and pelvis 09/30/2023.  Chest CT 07/19/2023 FINDINGS: CT CHEST FINDINGS Cardiovascular: Heart is normal size. Aorta is normal caliber. Diffuse 3 vessel coronary artery disease. Scattered aortic atherosclerosis. Mediastinum/Nodes: No mediastinal, hilar, or axillary adenopathy. Trachea and esophagus are unremarkable. Thyroid  unremarkable. Lungs/Pleura: Loculated  moderate right pleural effusion, increasing since prior chest CT. Airspace opacity in the right lower lobe and right middle lobe could reflect atelectasis or pneumonia. Predominantly linear opacities in the lingula and left lower lobe could reflect scarring or atelectasis. Subpleural nodularity noted along the left fissure and posteriorly. Left upper lobe nodule on image 37 measures 8 mm, stable since prior study. Left upper lobe nodule on image 36 measures 4 mm, new since prior study. Musculoskeletal: Chest wall soft tissues are unremarkable. No acute bony abnormality. CT ABDOMEN PELVIS FINDINGS Hepatobiliary: 1.9 cm low-density lesion in the right hepatic lobe. 1.8 cm previously, difficult to characterize on this noncontrast study. Gallbladder unremarkable. No biliary ductal dilatation. Pancreas: No focal abnormality or ductal dilatation. Spleen: No focal abnormality.  Normal size. Adrenals/Urinary Tract: Adrenal glands unremarkable. Persistent bilateral hydronephrosis, unchanged since prior study. Nonobstructing stones in the lower pole of the right kidney. Right kidney is atrophic. Multiple bilateral renal cysts are unchanged. Prior cystectomy with right lower quadrant urostomy. Ureters are difficult to follow without contrast. Stomach/Bowel: Moderate to large stool burden throughout the colon, similar to prior study. No bowel obstruction. Vascular/Lymphatic: Aortoiliac atherosclerosis. No evidence of aneurysm or adenopathy. 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 Other: No free fluid or free air. Musculoskeletal: Again noted is the lytic area within the left sacral ala and adjacent left iliac bone, unchanged since recent study. Degenerative disc and facet disease in the lumbar spine. IMPRESSION: Enlarging chronic loculated right pleural effusion and increasing right lower lobe airspace disease. This could reflect atelectasis or pneumonia. Nodularity noted along the left fissure and  subpleural locations, increasing since prior study. Cannot exclude metastases. New 4 mm left upper lobe pulmonary nodule. Recommend attention on follow-up imaging. Diffuse coronary artery disease.  Aortic atherosclerosis. 1.9 cm  low-density lesion in the right hepatic lobe, difficult to characterize on this noncontrast study. Prior cystectomy with right lower quadrant urostomy. Stable chronic bilateral hydronephrosis. Moderate to large stool burden throughout the colon. Stable lytic areas within the left sacrum and adjacent left iliac bone. Electronically Signed   By: Franky Crease M.D.   On: 10/15/2023 01:40   DG Chest Port 1 View Result Date: 10/14/2023 CLINICAL DATA:  Weakness EXAM: PORTABLE CHEST 1 VIEW COMPARISON:  09/30/2023 FINDINGS: Cardiac shadow is within normal limits. Increased density is noted over the right hemithorax which is new from the prior exam. The appearance suggests a loculated effusion. CT of the chest may be helpful for further evaluation. Left lung is clear. No acute bony abnormality is noted IMPRESSION: Increased density over the right hemithorax suggesting a loculated effusion. CT of the chest may be helpful for further evaluation. Electronically Signed   By: Oneil Devonshire M.D.   On: 10/14/2023 22:52   CT ABDOMEN PELVIS WO CONTRAST Result Date: 09/30/2023 CLINICAL DATA:  History of bladder cancer status post cystectomy with fever. * Tracking Code: BO * EXAM: CT ABDOMEN AND PELVIS WITHOUT CONTRAST TECHNIQUE: Multidetector CT imaging of the abdomen and pelvis was performed following the standard protocol without IV contrast. RADIATION DOSE REDUCTION: This exam was performed according to the departmental dose-optimization program which includes automated exposure control, adjustment of the mA and/or kV according to patient size and/or use of iterative reconstruction technique. COMPARISON:  CT abdomen and pelvis dated 09/17/2023 and multiple priors FINDINGS: Lower chest: Chronic right  pleural effusion with adjacent medial right lower lobe rounded density, likely round atelectasis. Additional subsegmental atelectasis in the middle lobe and lingula. Irregular basilar right lower lobe nodule measuring 1.3 x 0.7 cm (5:23), increased in size from 5 x 4 mm on 07/22/2023. Unchanged right lower lobe perifissural nodules. Partially imaged heart size is normal. Coronary artery calcifications. Hepatobiliary: Ill-defined 1.8 cm hypodensity in segment 4/8 (2:13), incompletely characterized, but appears new compared to more remote studies. No intra or extrahepatic biliary ductal dilation. Gallbladder is contracted. Pancreas: No focal lesions or main ductal dilation. Spleen: Normal in size without focal abnormality. Adrenals/Urinary Tract: No adrenal nodules. Persistent bilateral hydroureteronephrosis. Asymmetric right renal atrophy. Multifocal bilateral cysts are again seen including peripherally calcified right lower pole cysts. No renal calculi. The ureters are not well seen in the absence of contrast. Prior cystectomy. Right lower quadrant urostomy. Stomach/Bowel: Normal appearance of the stomach. No evidence of bowel wall thickening, distention, or inflammatory changes. Moderate volume stool throughout the colon. Appendix is not discretely seen. Vascular/Lymphatic: Aortic atherosclerosis. No enlarged abdominal or pelvic lymph nodes. Reproductive: Prostate brachytherapy seeds. Other: No free fluid, fluid collection, or free air. Musculoskeletal: Similar to slightly increased lytic changes of the left sacral ala. New lytic changes involving the left iliac bone adjacent to the sacroiliac joint. Degenerative changes of the hips, left-greater-than-right. Multilevel degenerative changes of the partially imaged thoracic and lumbar spine. IMPRESSION: 1. Ill-defined 1.8 cm hypodensity in segment 4/8 of the liver, incompletely characterized, but appears new compared to more remote studies. Findings are suspicious  for metastatic disease. Recommend further evaluation with contrast-enhanced MRI of the abdomen. Alternatively, attention on follow-up. 2. Similar to slightly increased lytic changes of the left sacral ala. New lytic changes involving the left iliac bone adjacent to the sacroiliac joint. 3. Irregular basilar right lower lobe nodule measuring 1.3 x 0.7 cm, increased in size from 5 x 4 mm on 07/22/2023, suspicious for metastatic disease.  4. Postsurgical change from cystectomy with right lower quadrant ostomy. Persistent bilateral hydroureteronephrosis. Ureters are not well evaluated in the absence of contrast. 5. Aortic Atherosclerosis (ICD10-I70.0). Coronary artery calcifications. Assessment for potential risk factor modification, dietary therapy or pharmacologic therapy may be warranted, if clinically indicated. Electronically Signed   By: Limin  Xu M.D.   On: 09/30/2023 20:28   DG Chest Port 1 View Result Date: 09/30/2023 CLINICAL DATA:  Fever.  Bladder cancer removed in August. EXAM: PORTABLE CHEST 1 VIEW COMPARISON:  09/02/2023 FINDINGS: The heart size and mediastinal contours are within normal limits. Both lungs are clear. The visualized skeletal structures are unremarkable. IMPRESSION: No active disease. Electronically Signed   By: Elsie Gravely M.D.   On: 09/30/2023 20:11    Labs:  CBC: Recent Labs    10/14/23 2237 10/16/23 0511 10/17/23 0506 10/18/23 0510  WBC 26.6* 17.1* 9.4 9.4  HGB 10.3* 8.2* 7.6* 8.6*  HCT 31.4* 25.2* 23.4* 27.1*  PLT 412* 279 255 311    COAGS: Recent Labs    05/28/23 1238 07/22/23 0408 07/22/23 0436 07/23/23 0350 09/30/23 1831 10/01/23 0600 10/14/23 2237  INR 1.2   < >  --  1.2 1.2 1.2 1.2  APTT 33  --  36  --   --  50* 40*   < > = values in this interval not displayed.    BMP: Recent Labs    10/16/23 0511 10/17/23 0506 10/18/23 0510 10/18/23 1213  NA 133* 133* 135 139  K 4.0 3.6 3.9 4.0  CL 108 105 110 111  CO2 15* 17* 16* 18*  GLUCOSE  118* 99 115* 109*  BUN 71* 74* 70* 68*  CALCIUM 9.4 9.2 9.4 9.6  CREATININE 2.91* 3.14* 2.53* 2.59*  GFRNONAA 22* 20* 26* 25*    LIVER FUNCTION TESTS: Recent Labs    09/30/23 1831 10/14/23 2237 10/15/23 1437 10/17/23 0506 10/18/23 0510  BILITOT 0.5 0.6  --  0.4 0.4  AST 32 24  --  19 15  ALT 26 17  --  19 16  ALKPHOS 144* 157*  --  104 95  PROT 7.2 7.0 6.5 5.6* 5.7*  ALBUMIN 2.8* 2.8*  --  1.9* 2.0*    TUMOR MARKERS: No results for input(s): AFPTM, CEA, CA199, CHROMGRNA in the last 8760 hours.  Assessment and Plan: 73 y.o. male with past medical history significant for arthritis, BPH, hyperlipidemia, GERD, cholelithiasis, prostate cancer, skin cancer, meningioma resection 2010, hypertension, ulcerative colitis, as well as metastatic urothelial cancer. He is status post robotic cystoprostatectomy with ileal conduit 05/22/2023.  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  He has undergone prior nephroureteral drain by our team in 2023 by our team for management of nephrolithiasis and right nephrostomy placement on 02/04/2023 secondary to right distal UVJ obstruction.  He was admitted to [facility] on 1/6 with fever, tachycardia, leukocytosis, elevated serum creatinine to 2.6 and COVID-positive.  Due to increasing size of loculated right pleural effusion a chest tube was placed by [facility] on 1/7.  Patient's creatinine remains elevated today at 2.59.  He desires palliative chemotherapy but is not a candidate at this time due to poor functional status.  Bilateral PCNs are now requested for decompression to see if creatinine will lower to allow for chemo.  Latest imaging studies have been reviewed by Dr. Luverne.Risks and benefits of bilateral  PCN placement was  discussed with the patient /spouse including, but not limited to, infection,  bleeding, significant bleeding causing loss or decrease in renal function or damage to adjacent structures.   All of the patient's questions were answered, patient is agreeable to proceed.  Consent signed and in chart. Procedure scheduled for this afternoon.      Thank you for this interesting consult.  I greatly enjoyed meeting [facility] and look forward to participating in their care.  A copy of this report was sent to the requesting provider on this date.  Electronically Signed: D. Franky Rakers, PA-C 10/18/2023, 2:56 PM   I spent a total of  30 minutes   in face to face in clinical consultation, greater than 50% of which was counseling/coordinating care for bilateral percutaneous nephrostomies

## 2023-10-18 NOTE — Procedures (Signed)
 Interventional Radiology Procedure Note  Procedure: Bilateral percutaneous nephrostomy tube placement  Complications: None  Estimated Blood Loss: < 10 mL  Findings: Bilateral 10 Fr percutaneous nephrostomy tubes placed. Left formed in renal pelvis. Initial urine return blood-tinged/bloody.  Right renal collecting system partially duplicated and tube partially in upper collecting system and partially extending down to renal pelvis.  Both PCN's to gravity bag drainage.  Marcey DASEN. Luverne, M.D Pager:  [phone]

## 2023-10-18 NOTE — Progress Notes (Signed)
 PT Cancellation Note  Patient Details Name: Wesley [name] MRN: [id number] DOB: 12/17/1950   Cancelled Treatment:    Reason Eval/Treat Not Completed: Other (comment) Pt awaiting bilateral nephrostomy tube placement later today.  Per RN, pt not likely to participate before procedure.  Would recommend pt be up with nursing in room today if mobility desired.  PT to check back as schedule permits.   Kati L Payson 10/18/2023, 1:10 PM Tari KLEIN, DPT Physical Therapist Acute Rehabilitation Services Office: [phone]

## 2023-10-18 NOTE — Consult Note (Signed)
 [facility] CONSULT NOTE  Patient Care Team: Wesley Elsie DASEN, MD as PCP - General (Internal Medicine)  ASSESSMENT & PLAN:  Metastatic urothelial cancer of the bladder His imaging studies suspicious for lung metastasis, bone metastasis and peritoneal metastasis to the skin He is very frail with ECOG performance status score of 4 He is not a candidate for any kind of treatment due to his frail status The patient desired treatment I recommend we focus on supportive care for now If he improves with aggressive supportive care, he could be a candidate for future chemotherapy  Acute on chronic renal failure He has signs of hydronephrosis Agreed to proceed with IR nephrostomy placement  Multifactorial anemia There is a component of iron  deficiency, folate deficiency and anemia chronic renal failure Recommend transfusion support as needed Recommend folic acid  supplement  Severe protein calorie malnutrition, cachexia Multifactorial, combination of his advanced disease versus poor appetite as well as recent infection Recommend frequent small meals and dietitian review  Severe constipation He will continue aggressive laxatives  Discharge planning  unknown  Goals of care discussion We have extensive goals of care discussion His ECOG performance status score is very poor  The patient desire palliative chemotherapy but he is not a candidate right now due to his poor functional status Recommend palliative care consult I will check on him tomorrow  The total time spent in the appointment was 80 minutes encounter with patients including review of chart and various tests results, discussions about plan of care and coordination of care plan   All questions were answered. The patient knows to call the clinic with any problems, questions or concerns. No barriers to learning was detected.  Wesley Bedford, MD 1/10/202511:43 AM  CHIEF COMPLAINTS/PURPOSE OF CONSULTATION:  Metastatic  bladder cancer  HISTORY OF PRESENTING ILLNESS:  Wesley [name] 73 y.o. male is seen at the request by hospitalist and family members.  The patient was diagnosed with urothelial cancer of the bladder with muscle invasion early this year.  He was seen by Dr. Sherrod in the outpatient clinic.  Dr. Sherrod felt that he is a poor candidate for systemic treatment and recommended radiation oncology consultation.  Ultimately, the patient underwent surgery in August due to poor candidate for radiation due to history of ulcerative colitis  Since his surgery, he has not been doing well.  He has very poor oral intake and cachexia.  He also had difficulties regulating his bowel movement. Since October, he had recurrent admission due to sepsis. With current hospitalization, he was readmitted since January 7 due to recurrent fever, leukocytosis, worsening renal failure and decline in performance status.  According to his wife who provided most of the history, his oral intake is very poor which he attributed to constipation.  He was seen by gastroenterologist in December and recommended Linzess .  He has been off his treatment for ulcerative colitis for some time.  The patient is also very sedentary due to excessive fatigue and spent majority of his time at home lying in bed or sitting on the couch With his current admission, he had CT imaging, cultures and multiple other imaging studies done including ultrasound which show hydronephrosis of his kidneys.  He is currently on broad-spectrum IV antibiotics.  Labs show evidence of anemia with combination of iron  deficiency and folate deficiency.  He is extremely malnourished with poor nutritional status and low albumin. I was asked to see the patient as he has not been followed by oncologist for  some time.  A consult was placed for outpatient oncology consult 3 days ago  I have reviewed his chart and materials related to his cancer extensively and collaborated history with  the patient. Summary of oncologic history is as follows: Oncology History  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)  02/22/2023 Initial Diagnosis   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)   05/22/2023 Pathology Results   SURGICAL PATHOLOGY  CASE: [phone]  PATIENT: Wesley [name]  Surgical Pathology Report      Clinical History: bladder cancer    s   FINAL MICROSCOPIC DIAGNOSIS:   A. LEFT COMMON ILIAC LYMPH NODE, EXCISION:  One benign lymph node (0/1)   B. LEFT PERITONEAL NODULE, BIOPSY:  Metastatic urothelial carcinoma, 1.0 cm   C. LEFT EXTERNAL ILIAC LYMPH NODE, EXCISION:  One benign lymph node (0/1)   D. LEFT OBTURATOR LYMPH NODE, EXCISION: Four benign lymph nodes (0/4)  E. RIGHT EXTERNAL LYMPH NODE, EXCISION: Metastatic urothelial carcinoma in two of two lymph nodes (2/2, 0.4 and 0.1 cm)  F. RIGHT OBTURATOR LYMPH NODE, EXCISION: Metastatic urothelial carcinoma in two of four lymph nodes (2/4, 0.5 and 0.3 cm with extra nodal extension)  G. BLADDER, PARTIAL PROSTATE, LEFT DISTAL STENT, RESECTION: Infiltrating high grade urothelial carcinoma, size 9.7 cm Tumor invades through muscularis propria into peri vesicle fat, seminal vesicle soft tissue and serosa surface Lymphovascular and perineural invasion is present Urothelial carcinoma in situ is present at anterior bladder wall  Margins of resection are positive at posterior seminal vesicle soft tissue (0.4 cm), bilateral ureteral orifice serosal margin (0.2 and 0.3 cm) Prostate showing post treatment effect with glands atrophy, drop off and  stromal fibrosis  Peri-prostatic urethral ulceration, abscess and reactive urothelium with  squamous metaplasia   H. RIGHT FINAL URETERAL MARGIN, RESECTION  Negative for carcinoma   I. LEFT FINAL URETERAL MARGIN, RESECTION:  Negative for carcinoma    URINARY BLADDER:  Procedure: Cystoprostatectomy  Tumor Site: entire  bladder  Tumor Size: 9.7 cm  Histologic Type: Urothelial carcinoma  Histologic Grade: High grade  Tumor Extension: perivesicle soft tissue to serosa surface, periseminal  vesicle soft tissue, and peritoneal  Lymphovascular Invasion: Identified  Margin: positive  Margin for invasive tumor: positive Margin for Carcinoma in situ/Noninvasive papillary urothelial carcinoma: negative Regional Lymph Nodes:         No lymph nodes submitted or found: NA         Number of Lymph Nodes Involved: 4         Number of Lymph Nodes Examined: 12 Distant metastasis: NA Pathologic Stage Classification (pTNM, AJCC 8th Edition): pT4, pN2 Representative Tumor Block: G4 Comment(s): Infiltrating high grade urothelial carcinoma with necrosis, extensively involves entire bladder, tumor invades perivesicle fat to the surface of serosa at left and right ureteral orifices; the carcinoma also infiltrating into seminal vesicle soft tissue with positive margin. Peritoneal involvement is identified.  Lymphovascular and perineural invasion is present. Four of twelve lymph nodes are positive for urothelial carcinoma.     10/15/2023 Imaging   DG CHEST PORT 1 VIEW Result Date: 10/18/2023 CLINICAL DATA:  [id number] with pleural effusion and chest tube in place. EXAM: PORTABLE CHEST 1 VIEW COMPARISON:  Portable chest yesterday at 5:17 a.m. FINDINGS: 4:58 a.m. pigtail right chest tube remains with the pigtail superimposing in the lateral lower chest alongside the elevated hemidiaphragm. There is no measurable pneumothorax. Small right pleural effusion has reaccumulated with adjacent hazy atelectasis or consolidation. The left  lung and right upper lung fields are clear. The cardiomediastinal silhouette is stable. There is aortic atherosclerosis. No vascular congestion is seen. No new osseous findings. Thoracic spondylosis and DDD. IMPRESSION: 1. Small right pleural effusion has reaccumulated with adjacent hazy atelectasis or  consolidation. 2. Right chest tube remains in place. No measurable pneumothorax. 3. Aortic atherosclerosis. Electronically Signed   By: Francis Quam M.D.   On: 10/18/2023 06:57   DG CHEST PORT 1 VIEW Result Date: 10/17/2023 CLINICAL DATA:  Status post right chest tube placement for loculated right pleural effusion. EXAM: PORTABLE CHEST 1 VIEW COMPARISON:  10/16/2023 FINDINGS: The heart size and mediastinal contours are within normal limits. Stable positioning right pleural drainage catheter. No significant residual pleural fluid. Stable mild atelectasis at the right lung base with elevation of the right hemidiaphragm. No pneumothorax or pulmonary edema. The visualized skeletal structures are unremarkable. IMPRESSION: Stable positioning of right pleural drainage catheter. No significant residual pleural fluid. Stable mild atelectasis at the right lung base with elevation of the right hemidiaphragm. Electronically Signed   By: Marcey Moan M.D.   On: 10/17/2023 09:28   US  RENAL Result Date: 10/17/2023 CLINICAL DATA:  [id number]. Renal failure. Prior cystectomy with right lower quadrant urostomy. Surgery date 05/22/2023. EXAM: RENAL / URINARY TRACT ULTRASOUND COMPLETE COMPARISON:  CT without contrast 10/15/2023, renal ultrasound 07/24/2023 FINDINGS: Right Kidney: Renal measurements: Atrophic measuring 5.2 x 5.0 x 4.1 cm = volume: 55.9 mL, previously 69.6 mL. The cortex is thin and echogenic as before. There is moderate hydronephrosis which was seen on the CT yesterday but was not seen on the ultrasound from 07/24/2023. 2 cm anechoic cyst in the upper pole is again shown. No mass is evident and no visible stones. Left Kidney: Renal measurements: 10.0 x 7.0 x 7.4 cm = volume: 275.8 mL. The renal cortex again is mildly echogenic but no more than previously. Severe hydronephrosis is again noted and was present on both prior studies. There is hypoechoic debris within some of the dilated calices which could be  infectious or hemorrhagic debris. Correlate with urinalysis. There are multiple anechoic cysts, largest is 6.7 cm, as before. No complex cyst is seen. No visible stones. Bladder: Surgically absent. Other: None. IMPRESSION: 1. Moderate right hydronephrosis which was seen on the CT yesterday but was not seen on the ultrasound from 07/24/2023. 2. Severe left hydronephrosis which was present on both prior studies. There is hypoechoic debris within some of the dilated calices which could be infectious or hemorrhagic debris. Correlate with urinalysis. 3. Atrophic right kidney with thin and echogenic cortex as before. Mild but unchanged increased echogenicity of the left kidney. 4. Bilateral renal cysts. 5. Prior cystectomy with right lower quadrant urostomy. Electronically Signed   By: Francis Quam M.D.   On: 10/17/2023 02:14   DG Chest Port 1 View Result Date: 10/16/2023 CLINICAL DATA:  Pleural effusion. EXAM: PORTABLE CHEST 1 VIEW COMPARISON:  October 15, 2023. FINDINGS: Right-sided chest tube is again noted.  No pneumothorax is noted. IMPRESSION: No definite pneumothorax seen currently. Right-sided chest tube is again noted. Electronically Signed   By: Lynwood Landy Raddle M.D.   On: 10/16/2023 09:18   DG Chest Port 1 View Result Date: 10/15/2023 CLINICAL DATA:  Thoracostomy placement EXAM: PORTABLE CHEST 1 VIEW COMPARISON:  10/14/2023 FINDINGS: Chest tube in place on the right in the inferior pleural space. Moderate right pneumothorax, estimated at 30%. No tension. Volume loss persists at the left lung base. IMPRESSION: Chest tube in place  on the right. Moderate right pneumothorax, estimated at 30%. No tension. Electronically Signed   By: Oneil Officer M.D.   On: 10/15/2023 14:51   CT CHEST ABDOMEN PELVIS WO CONTRAST Result Date: 10/15/2023 CLINICAL DATA:  Set CIS, elevated white count. EXAM: CT CHEST, ABDOMEN AND PELVIS WITHOUT CONTRAST TECHNIQUE: Multidetector CT imaging of the chest, abdomen and pelvis was  performed following the standard protocol without IV contrast. RADIATION DOSE REDUCTION: This exam was performed according to the departmental dose-optimization program which includes automated exposure control, adjustment of the mA and/or kV according to patient size and/or use of iterative reconstruction technique. COMPARISON:  CT abdomen and pelvis 09/30/2023.  Chest CT 07/19/2023 FINDINGS: CT CHEST FINDINGS Cardiovascular: Heart is normal size. Aorta is normal caliber. Diffuse 3 vessel coronary artery disease. Scattered aortic atherosclerosis. Mediastinum/Nodes: No mediastinal, hilar, or axillary adenopathy. Trachea and esophagus are unremarkable. Thyroid  unremarkable. Lungs/Pleura: Loculated moderate right pleural effusion, increasing since prior chest CT. Airspace opacity in the right lower lobe and right middle lobe could reflect atelectasis or pneumonia. Predominantly linear opacities in the lingula and left lower lobe could reflect scarring or atelectasis. Subpleural nodularity noted along the left fissure and posteriorly. Left upper lobe nodule on image 37 measures 8 mm, stable since prior study. Left upper lobe nodule on image 36 measures 4 mm, new since prior study. Musculoskeletal: Chest wall soft tissues are unremarkable. No acute bony abnormality. CT ABDOMEN PELVIS FINDINGS Hepatobiliary: 1.9 cm low-density lesion in the right hepatic lobe. 1.8 cm previously, difficult to characterize on this noncontrast study. Gallbladder unremarkable. No biliary ductal dilatation. Pancreas: No focal abnormality or ductal dilatation. Spleen: No focal abnormality.  Normal size. Adrenals/Urinary Tract: Adrenal glands unremarkable. Persistent bilateral hydronephrosis, unchanged since prior study. Nonobstructing stones in the lower pole of the right kidney. Right kidney is atrophic. Multiple bilateral renal cysts are unchanged. Prior cystectomy with right lower quadrant urostomy. Ureters are difficult to follow without  contrast. Stomach/Bowel: Moderate to large stool burden throughout the colon, similar to prior study. No bowel obstruction. Vascular/Lymphatic: Aortoiliac atherosclerosis. No evidence of aneurysm or adenopathy. 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 Other: No free fluid or free air. Musculoskeletal: Again noted is the lytic area within the left sacral ala and adjacent left iliac bone, unchanged since recent study. Degenerative disc and facet disease in the lumbar spine. IMPRESSION: Enlarging chronic loculated right pleural effusion and increasing right lower lobe airspace disease. This could reflect atelectasis or pneumonia. Nodularity noted along the left fissure and subpleural locations, increasing since prior study. Cannot exclude metastases. New 4 mm left upper lobe pulmonary nodule. Recommend attention on follow-up imaging. Diffuse coronary artery disease.  Aortic atherosclerosis. 1.9 cm low-density lesion in the right hepatic lobe, difficult to characterize on this noncontrast study. Prior cystectomy with right lower quadrant urostomy. Stable chronic bilateral hydronephrosis. Moderate to large stool burden throughout the colon. Stable lytic areas within the left sacrum and adjacent left iliac bone. Electronically Signed   By: Franky Crease M.D.   On: 10/15/2023 01:40   DG Chest Port 1 View Result Date: 10/14/2023 CLINICAL DATA:  Weakness EXAM: PORTABLE CHEST 1 VIEW COMPARISON:  09/30/2023 FINDINGS: Cardiac shadow is within normal limits. Increased density is noted over the right hemithorax which is new from the prior exam. The appearance suggests a loculated effusion. CT of the chest may be helpful for further evaluation. Left lung is clear. No acute bony abnormality is noted IMPRESSION: Increased density over the right hemithorax suggesting  a loculated effusion. CT of the chest may be helpful for further evaluation. Electronically Signed   By: Oneil Devonshire M.D.   On: 10/14/2023 22:52    CT ABDOMEN PELVIS WO CONTRAST Result Date: 09/30/2023 CLINICAL DATA:  History of bladder cancer status post cystectomy with fever. * Tracking Code: BO * EXAM: CT ABDOMEN AND PELVIS WITHOUT CONTRAST TECHNIQUE: Multidetector CT imaging of the abdomen and pelvis was performed following the standard protocol without IV contrast. RADIATION DOSE REDUCTION: This exam was performed according to the departmental dose-optimization program which includes automated exposure control, adjustment of the mA and/or kV according to patient size and/or use of iterative reconstruction technique. COMPARISON:  CT abdomen and pelvis dated 09/17/2023 and multiple priors FINDINGS: Lower chest: Chronic right pleural effusion with adjacent medial right lower lobe rounded density, likely round atelectasis. Additional subsegmental atelectasis in the middle lobe and lingula. Irregular basilar right lower lobe nodule measuring 1.3 x 0.7 cm (5:23), increased in size from 5 x 4 mm on 07/22/2023. Unchanged right lower lobe perifissural nodules. Partially imaged heart size is normal. Coronary artery calcifications. Hepatobiliary: Ill-defined 1.8 cm hypodensity in segment 4/8 (2:13), incompletely characterized, but appears new compared to more remote studies. No intra or extrahepatic biliary ductal dilation. Gallbladder is contracted. Pancreas: No focal lesions or main ductal dilation. Spleen: Normal in size without focal abnormality. Adrenals/Urinary Tract: No adrenal nodules. Persistent bilateral hydroureteronephrosis. Asymmetric right renal atrophy. Multifocal bilateral cysts are again seen including peripherally calcified right lower pole cysts. No renal calculi. The ureters are not well seen in the absence of contrast. Prior cystectomy. Right lower quadrant urostomy. Stomach/Bowel: Normal appearance of the stomach. No evidence of bowel wall thickening, distention, or inflammatory changes. Moderate volume stool throughout the colon. Appendix  is not discretely seen. Vascular/Lymphatic: Aortic atherosclerosis. No enlarged abdominal or pelvic lymph nodes. Reproductive: Prostate brachytherapy seeds. Other: No free fluid, fluid collection, or free air. Musculoskeletal: Similar to slightly increased lytic changes of the left sacral ala. New lytic changes involving the left iliac bone adjacent to the sacroiliac joint. Degenerative changes of the hips, left-greater-than-right. Multilevel degenerative changes of the partially imaged thoracic and lumbar spine. IMPRESSION: 1. Ill-defined 1.8 cm hypodensity in segment 4/8 of the liver, incompletely characterized, but appears new compared to more remote studies. Findings are suspicious for metastatic disease. Recommend further evaluation with contrast-enhanced MRI of the abdomen. Alternatively, attention on follow-up. 2. Similar to slightly increased lytic changes of the left sacral ala. New lytic changes involving the left iliac bone adjacent to the sacroiliac joint. 3. Irregular basilar right lower lobe nodule measuring 1.3 x 0.7 cm, increased in size from 5 x 4 mm on 07/22/2023, suspicious for metastatic disease. 4. Postsurgical change from cystectomy with right lower quadrant ostomy. Persistent bilateral hydroureteronephrosis. Ureters are not well evaluated in the absence of contrast. 5. Aortic Atherosclerosis (ICD10-I70.0). Coronary artery calcifications. Assessment for potential risk factor modification, dietary therapy or pharmacologic therapy may be warranted, if clinically indicated. Electronically Signed   By: Limin  Xu M.D.   On: 09/30/2023 20:28   DG Chest Port 1 View Result Date: 09/30/2023 CLINICAL DATA:  Fever.  Bladder cancer removed in August. EXAM: PORTABLE CHEST 1 VIEW COMPARISON:  09/02/2023 FINDINGS: The heart size and mediastinal contours are within normal limits. Both lungs are clear. The visualized skeletal structures are unremarkable. IMPRESSION: No active disease. Electronically Signed    By: Elsie Gravely M.D.   On: 09/30/2023 20:11      10/18/2023 Cancer Staging  Staging form: Urinary Bladder, AJCC 8th Edition - Clinical stage from 10/18/2023: rcT4a, cN2, cM1 - Signed by Lonn Hicks, MD on 10/18/2023 Stage prefix: Recurrence Histologic grade (G): G3 Histologic grading system: 3 grade system     MEDICAL HISTORY:  Past Medical History:  Diagnosis Date   Arthritis    Atypical chest pain    cardiologist-- dr gerard;  chronic occasional,  pressure / discomfort, LOV 07/14/21 (as of 06/08/22)   Bladder calculus    BPH with obstruction/lower urinary tract symptoms    Follows with Dr. Valli Shank, urologist.   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 Gretel at [facility] neurology, LOV w/ FNP on 03/21/22 in Epic ( as of 06/08/22)   Generalized abdominal pain    w/  bloating due to UC  take doxepin    GERD (gastroesophageal reflux disease)    H/O urinary retention    History of bladder stone    History of hiatal hernia    History of kidney stones    History of prostate cancer 2010   urologist--- dr pace;   dx 2010,  Gleason 4+3, PSA 5.2;   03-04-2009  s/p radiactive prostate seed implants and external beam radiation   History of repair of hiatal hernia    02-11-2017  s/p lap paraesophageal hernia repair w/ nissen fundoplication   History of skin cancer    Hx of resection of meningioma 02/11/2009   benign right temperal lobe meningioma s/p resection  (residual hearing loss)   (neurosurgeon-- dr madilyn)   Hypertension    followed by cardiologist and pcp   Insomnia    Migraine with aura and without status migrainosus, not intractable    neurologist-- dr Wesley gretel   Prostate cancer [facility])    Simple  renal cyst    bilateral ;   followed by urologist   Ulcerative colitis (HCC)    followed by dr v. shana (GI);  dx 2010;  current treatment stelara q8wks, LOV 06/07/22 (as of 06/08/22)   Wears glasses     SURGICAL HISTORY: Past Surgical History:  Procedure Laterality Date   BOTOX  INJECTION N/A 01/29/2023   Procedure: BOTOX  INJECTION 100 UNITS;  Surgeon: Shank Valli BIRCH, MD;  Location: [facility];  Service: Urology;  Laterality: N/A;   BRAIN MENINGIOMA EXCISION  02/11/2009   @[facility] ;  craniotomy , right temporal medial sphonoid wing resection   CARPAL TUNNEL RELEASE Bilateral    right 09-05-2016  @[facility] ;   left 2013   COLONOSCOPY  12/2019   COLONOSCOPY  01/2022   CYSTOSCOPY  03/05/2012   Procedure: CYSTOSCOPY;  Surgeon: Donnice Gwenyth Brooks, MD;  Location: [facility];  Service: Urology;  Laterality: N/A;  Cysto Clot Evacuation and Fulgeration Of [place] [facility])   CYSTOSCOPY WITH INJECTION N/A 05/22/2023   Procedure: CYSTOSCOPY WITH INJECTION OF INDOCYANINE GREEN DYE;  Surgeon: Alvaro Ricardo KATHEE Mickey., MD;  Location: [facility];  Service: Urology;  Laterality: N/A;   CYSTOSCOPY WITH LITHOLAPAXY N/A 03/13/2019   Procedure: CYSTOSCOPY WITH lASER LITHOLAPAXY, DILATION OF STRICTURE;  Surgeon: Ottelin, Mark, MD;  Location: [facility];  Service: Urology;  Laterality: N/A;   CYSTOSCOPY WITH LITHOLAPAXY N/A 01/02/2022   Procedure: CYSTOSCOPY  WITH LITHOTRIPSY;  Surgeon: Elisabeth Valli BIRCH, MD;  Location: [facility];  Service: Urology;  Laterality: N/A;   CYSTOSCOPY WITH RETROGRADE PYELOGRAM, URETEROSCOPY AND STENT PLACEMENT Right 03/13/2022   Procedure: CYSTOSCOPY WITH RETROGRADE PYELOGRAM, URETEROSCOPY AND STENT PLACEMENT;  Surgeon: Elisabeth Valli BIRCH, MD;  Location: [facility];  Service: Urology;  Laterality: Right;  75 MINS   CYSTOSCOPY WITH RETROGRADE PYELOGRAM, URETEROSCOPY AND STENT PLACEMENT Right 08/07/2022   Procedure: CYSTOSCOPY WITH  RETROGRADE PYELOGRAM, URETEROSCOPY AND STENT PLACEMENT, REMOVAL OF NEPHROURETERAL STENT;  Surgeon: Elisabeth Valli BIRCH, MD;  Location: [facility];  Service: Urology;  Laterality: Right;  75 MINS   CYSTOSCOPY WITH URETHRAL DILATATION N/A 08/23/2020   Procedure: CYSTOSCOPY WITH URETHRAL DILATATION, BLADDER BIOPSY, FULGERATION;  Surgeon: Elisabeth Valli BIRCH, MD;  Location: [facility];  Service: Urology;  Laterality: N/A;  1 HR   CYSTOSCOPY WITH URETHRAL DILATATION N/A 03/13/2021   Procedure: CYSTOSCOPY WITH URETHRAL BALLOON DILATATION/ FOLEY CATHETER PLACEMENT;  Surgeon: Elisabeth Valli BIRCH, MD;  Location: [facility];  Service: Urology;  Laterality: N/A;   CYSTOSCOPY WITH URETHRAL DILATATION Left 07/08/2009   @[facility] :   with left  RTG   CYSTOSCOPY WITH URETHRAL DILATATION N/A 06/19/2022   Procedure: CYSTOSCOPY WITH OPTILUME URETHRAL DILATATION;  Surgeon: Elisabeth Valli BIRCH, MD;  Location: [facility];  Service: Urology;  Laterality: N/A;   CYSTOSCOPY/URETEROSCOPY/HOLMIUM LASER/STENT PLACEMENT Bilateral 01/29/2023   Procedure: DIAGNOSTIC CYSTOSCOPY , LEFT RETROGRADE PYELOGRAM, LEFT URETERAL STENT PLACEMENT, AND STONE BASKETING;  Surgeon: Elisabeth Valli BIRCH, MD;  Location: [facility];  Service: Urology;  Laterality: Bilateral;   HOLMIUM LASER APPLICATION N/A 08/23/2020   Procedure: HOLMIUM LASER APPLICATION OF URETHRAL STONE;  Surgeon: Elisabeth Valli BIRCH, MD;  Location: [facility];  Service: Urology;  Laterality: N/A;   HOLMIUM LASER APPLICATION Right 03/13/2022   Procedure: HOLMIUM LASER APPLICATION;  Surgeon: Elisabeth Valli BIRCH, MD;  Location: [facility];  Service: Urology;  Laterality: Right;   HOLMIUM LASER APPLICATION Right 08/07/2022   Procedure: HOLMIUM LASER APPLICATION;  Surgeon: Elisabeth Valli BIRCH, MD;  Location: [facility];  Service: Urology;  Laterality: Right;   INGUINAL HERNIA REPAIR Right 05/03/2015   @[facility]    INSERTION PROSTATE  RADIATION SEED  03/04/2009   @[facility]    IR NEPHROSTOMY PLACEMENT RIGHT  07/18/2022   IR URETERAL STENT RIGHT NEW ACCESS W/O SEP NEPHROSTOMY CATH  02/04/2023   KNEE CARTILAGE SURGERY Left    1980S   LAPAROSCOPIC PARAESOPHAGEAL HERNIA REPAIR  02/11/2017   @[facility] ;  W/  NISSEN FUNDOPLICATION (MESH)   LYMPH NODE DISSECTION Bilateral 05/22/2023   Procedure: LYMPH NODE DISSECTION;  Surgeon: Alvaro Ricardo KATHEE Mickey., MD;  Location: [facility];  Service: Urology;  Laterality: Bilateral;   NEPHROLITHOTOMY Right 03/29/2023   Procedure: NEPHROLITHOTOMY PERCUTANEOUS;  Surgeon: Alvaro Ricardo KATHEE Mickey., MD;  Location: [facility];  Service: Urology;  Laterality: Right;  2 HRS   PROSTATE BIOPSY     ROBOT ASSISTED LAPAROSCOPIC COMPLETE CYSTECT ILEAL CONDUIT N/A 05/22/2023   Procedure: XI ROBOTIC ASSISTED LAPAROSCOPIC COMPLETE CYSTECT ILEAL CONDUIT;  Surgeon: Alvaro Ricardo KATHEE Mickey., MD;  Location: [facility];  Service: Urology;  Laterality: N/A;   ROBOT ASSISTED LAPAROSCOPIC RADICAL PROSTATECTOMY N/A 05/22/2023   Procedure: XI ROBOTIC ASSISTED LAPAROSCOPIC RADICAL PROSTATECTOMY;  Surgeon: Alvaro Ricardo KATHEE Mickey., MD;  Location: [facility];  Service: Urology;  Laterality: N/A;   TOE SURGERY     TRANSURETHRAL RESECTION OF BLADDER TUMOR N/A 01/02/2022   Procedure:  TRANSURETHRAL RESECTION OF BLADDER TUMOR (TURBT);  Surgeon: Elisabeth Valli BIRCH, MD;  Location: [facility];  Service: Urology;  Laterality: N/A;   TRANSURETHRAL RESECTION OF BLADDER TUMOR N/A 01/29/2023   Procedure: TRANSURETHRAL RESECTION OF BLADDER TUMOR (TURBT);  Surgeon: Elisabeth Valli BIRCH, MD;  Location: [facility];  Service: Urology;  Laterality: N/A;    SOCIAL HISTORY: Social History   Socioeconomic History   Marital status: Married    Spouse name: Not on file   Number of children: Not on file   Years of education: Not on file   Highest education level: Not on file  Occupational History   Not on file  Tobacco Use   Smoking status: Former     Current packs/day: 0.00    Average packs/day: 1 pack/day for 10.0 years (10.0 ttl pk-yrs)    Types: Cigarettes    Start date: 03/01/1977    Quit date: 03/02/1987    Years since quitting: 36.6   Smokeless tobacco: Never  Vaping Use   Vaping status: Never Used  Substance and Sexual Activity   Alcohol use: Yes    Comment: Rare   Drug use: Never   Sexual activity: Not Currently  Other Topics Concern   Not on file  Social History Narrative   Not on file   Social Drivers of Health   Financial Resource Strain: Low Risk  (09/26/2023)   Received from [facility]   Overall Financial Resource Strain (CARDIA)    Difficulty of Paying Living Expenses: Not hard at all  Food Insecurity: No Food Insecurity (10/15/2023)   Hunger Vital Sign    Worried About Running Out of Food in the Last Year: Never true    Ran Out of Food in the Last Year: Never true  Transportation Needs: No Transportation Needs (10/15/2023)   PRAPARE - Administrator, Civil Service (Medical): No    Lack of Transportation (Non-Medical): No  Physical Activity: Insufficiently Active (09/26/2023)   Received from [facility]   Exercise Vital Sign    Days of Exercise per Week: 2 days    Minutes of Exercise per Session: 30 min  Stress: No Stress Concern Present (09/26/2023)   Received from [facility] - Occupational Stress Questionnaire    Feeling of Stress : Not at all  Social Connections: Socially Integrated (10/15/2023)   Social Connection and Isolation Panel [NHANES]    Frequency of Communication with Friends and Family: More than three times a week    Frequency of Social Gatherings with Friends and Family: Once a week    Attends Religious Services: More than 4 times per year    Active Member of [employer] or Organizations: Yes    Attends Banker Meetings: More than 4 times per year    Marital Status: Married  Catering Manager Violence: Not At Risk (10/15/2023)    Humiliation, Afraid, Rape, and Kick questionnaire    Fear of Current or Ex-Partner: No    Emotionally Abused: No    Physically Abused: No    Sexually Abused: No    FAMILY HISTORY: History reviewed. No pertinent family history.  ALLERGIES:  is allergic to sulfa antibiotics.  MEDICATIONS:  Current Facility-Administered Medications  Medication Dose Route Frequency Provider Last Rate Last Admin   acetaminophen  (TYLENOL ) tablet 650 mg  650 mg Oral Q6H PRN Zella Katha HERO, MD   650 mg at 10/16/23 2157   Or   acetaminophen  (  TYLENOL ) suppository 650 mg  650 mg Rectal Q6H PRN Zella, Mir M, MD       amLODipine  (NORVASC ) tablet 5 mg  5 mg Oral QHS Zella, Mir M, MD   5 mg at 10/17/23 2223   doxepin  (SINEQUAN ) capsule 25 mg  25 mg Oral QHS Zella, Mir M, MD   25 mg at 10/17/23 2224   feeding supplement (ENSURE ENLIVE / ENSURE PLUS) liquid 237 mL  237 mL Oral BID BM Zella, Mir M, MD   237 mL at 10/17/23 1325   fluticasone  furoate-vilanterol (BREO ELLIPTA ) 100-25 MCG/ACT 1 puff  1 puff Inhalation Daily Zella, Mir M, MD   1 puff at 10/17/23 0753   folic acid  (FOLVITE ) tablet 1 mg  1 mg Oral Daily Shalhoub, George J, MD   1 mg at 10/17/23 9176   heparin  injection 5,000 Units  5,000 Units Subcutaneous Q8H Zella, Mir M, MD   5,000 Units at 10/18/23 9361   lactated ringers  infusion   Intravenous Continuous Shalhoub, Zachary PARAS, MD       levETIRAcetam  (KEPPRA ) tablet 500 mg  500 mg Oral BID Zella, Mir M, MD   500 mg at 10/18/23 9178   linaclotide  (LINZESS ) capsule 145 mcg  145 mcg Oral Daily Zella, Mir M, MD   145 mcg at 10/17/23 9176   meclizine  (ANTIVERT ) tablet 25 mg  25 mg Oral Q6H PRN Zella Katha HERO, MD       multivitamin with minerals tablet 1 tablet  1 tablet Oral Daily Shalhoub, Zachary PARAS, MD   1 tablet at 10/17/23 1557   ondansetron  (ZOFRAN ) tablet 4 mg  4 mg Oral Q6H PRN Zella, Mir M, MD       Or   ondansetron  (ZOFRAN ) injection 4 mg  4 mg  Intravenous Q6H PRN Zella Katha HERO, MD       OXcarbazepine  ER TB24 600 mg  600 mg Oral Q1500 Ikramullah, Mir M, MD   600 mg at 10/17/23 1748   oxyCODONE -acetaminophen  (PERCOCET/ROXICET) 5-325 MG per tablet 1-2 tablet  1-2 tablet Oral Q6H PRN Babcock, Peter E, NP   2 tablet at 10/17/23 1617   piperacillin -tazobactam (ZOSYN ) IVPB 2.25 g  2.25 g Intravenous Q6H Gadhia, Jigna M, RPH 100 mL/hr at 10/18/23 0819 2.25 g at 10/18/23 0819   polyethylene glycol (MIRALAX  / GLYCOLAX ) packet 17 g  17 g Oral Daily PRN Zella Katha HERO, MD       primidone  (MYSOLINE ) tablet 100 mg  100 mg Oral QHS Zella, Mir M, MD   100 mg at 10/17/23 2224   senna (SENOKOT) tablet 17.2 mg  2 tablet Oral QHS Kenard Zachary PARAS, MD   17.2 mg at 10/17/23 2223   sodium chloride  flush (NS) 0.9 % injection 10 mL  10 mL Intrapleural Q8H Jenna Maude BRAVO, NP   10 mL at 10/18/23 9360   sodium chloride  flush (NS) 0.9 % injection 10 mL  10 mL Intrapleural Q8H Kara Carrier B, MD   10 mL at 10/18/23 0131   traZODone  (DESYREL ) tablet 25 mg  25 mg Oral QHS PRN Zella Katha HERO, MD   25 mg at 10/16/23 2156   Facility-Administered Medications Ordered in Other Encounters  Medication Dose Route Frequency Provider Last Rate Last Admin   magnesium  citrate solution 1 Bottle  1 Bottle Oral Once Manny, Theodore B Jr., MD       magnesium  citrate solution 1 Bottle  1 Bottle Oral Once Manny, Ricardo KATHEE Raddle., MD  REVIEW OF SYSTEMS:  All other systems were reviewed with the patient and are negative.  PHYSICAL EXAMINATION: ECOG PERFORMANCE STATUS: 4 - Bedbound  Vitals:   10/17/23 1945 10/18/23 0648  BP: 118/69 114/79  Pulse: 97   Resp: 16 16  Temp: 99.3 F (37.4 C) 97.6 F (36.4 C)  SpO2: 96% 95%   Filed Weights   10/14/23 2233 10/15/23 2200  Weight: 109 lb 9.6 oz (49.7 kg) 105 lb 2.6 oz (47.7 kg)    GENERAL:alert, but he falls asleep to close his eyes intermittently.  He appears very weak and cachectic SKIN: s noted  keratosis on his skin.  Noted subcutaneous nodule on the left flank from prior surgical site suspicious for metastatic deposit at the port site EYES: normal, conjunctiva are pink and non-injected, sclera clear OROPHARYNX: He has very dry mucous membrane NECK: supple, thyroid  normal size, non-tender, without nodularity LYMPH:  no palpable lymphadenopathy in the cervical, axillary or inguinal LUNGS: Shallow breathing, reduced breath sound bilaterally HEART: regular rate & rhythm and no murmurs and no lower extremity edema ABDOMEN:abdomen soft, ostomy in situ. Musculoskeletal:no cyanosis of digits and no clubbing.  Noted muscle wasting PSYCH: alert & oriented x 3 with fluent speech NEURO: He is weak.  LABORATORY DATA:  I have reviewed the data as listed Lab Results  Component Value Date   WBC 9.4 10/18/2023   HGB 8.6 (L) 10/18/2023   HCT 27.1 (L) 10/18/2023   MCV 95.1 10/18/2023   PLT 311 10/18/2023   Recent Labs    10/14/23 2237 10/15/23 1437 10/16/23 0511 10/17/23 0506 10/18/23 0510  NA 135  --  133* 133* 135  K 3.9  --  4.0 3.6 3.9  CL 109  --  108 105 110  CO2 15*  --  15* 17* 16*  GLUCOSE 123*  --  118* 99 115*  BUN 60*  --  71* 74* 70*  CREATININE 2.58*  --  2.91* 3.14* 2.53*  CALCIUM 10.1  --  9.4 9.2 9.4  GFRNONAA 25*  --  22* 20* 26*  PROT 7.0 6.5  --  5.6* 5.7*  ALBUMIN 2.8*  --   --  1.9* 2.0*  AST 24  --   --  19 15  ALT 17  --   --  19 16  ALKPHOS 157*  --   --  104 95  BILITOT 0.6  --   --  0.4 0.4    RADIOGRAPHIC STUDIES: I have personally reviewed the radiological images as listed and agreed with the findings in the report. DG CHEST PORT 1 VIEW Result Date: 10/18/2023 CLINICAL DATA:  [id number] with pleural effusion and chest tube in place. EXAM: PORTABLE CHEST 1 VIEW COMPARISON:  Portable chest yesterday at 5:17 a.m. FINDINGS: 4:58 a.m. pigtail right chest tube remains with the pigtail superimposing in the lateral lower chest alongside the elevated  hemidiaphragm. There is no measurable pneumothorax. Small right pleural effusion has reaccumulated with adjacent hazy atelectasis or consolidation. The left lung and right upper lung fields are clear. The cardiomediastinal silhouette is stable. There is aortic atherosclerosis. No vascular congestion is seen. No new osseous findings. Thoracic spondylosis and DDD. IMPRESSION: 1. Small right pleural effusion has reaccumulated with adjacent hazy atelectasis or consolidation. 2. Right chest tube remains in place. No measurable pneumothorax. 3. Aortic atherosclerosis. Electronically Signed   By: Francis Quam M.D.   On: 10/18/2023 06:57   DG CHEST PORT 1 VIEW Result Date: 10/17/2023 CLINICAL DATA:  Status post right chest tube placement for loculated right pleural effusion. EXAM: PORTABLE CHEST 1 VIEW COMPARISON:  10/16/2023 FINDINGS: The heart size and mediastinal contours are within normal limits. Stable positioning right pleural drainage catheter. No significant residual pleural fluid. Stable mild atelectasis at the right lung base with elevation of the right hemidiaphragm. No pneumothorax or pulmonary edema. The visualized skeletal structures are unremarkable. IMPRESSION: Stable positioning of right pleural drainage catheter. No significant residual pleural fluid. Stable mild atelectasis at the right lung base with elevation of the right hemidiaphragm. Electronically Signed   By: Marcey Moan M.D.   On: 10/17/2023 09:28   US  RENAL Result Date: 10/17/2023 CLINICAL DATA:  [id number]. Renal failure. Prior cystectomy with right lower quadrant urostomy. Surgery date 05/22/2023. EXAM: RENAL / URINARY TRACT ULTRASOUND COMPLETE COMPARISON:  CT without contrast 10/15/2023, renal ultrasound 07/24/2023 FINDINGS: Right Kidney: Renal measurements: Atrophic measuring 5.2 x 5.0 x 4.1 cm = volume: 55.9 mL, previously 69.6 mL. The cortex is thin and echogenic as before. There is moderate hydronephrosis which was seen on the CT  yesterday but was not seen on the ultrasound from 07/24/2023. 2 cm anechoic cyst in the upper pole is again shown. No mass is evident and no visible stones. Left Kidney: Renal measurements: 10.0 x 7.0 x 7.4 cm = volume: 275.8 mL. The renal cortex again is mildly echogenic but no more than previously. Severe hydronephrosis is again noted and was present on both prior studies. There is hypoechoic debris within some of the dilated calices which could be infectious or hemorrhagic debris. Correlate with urinalysis. There are multiple anechoic cysts, largest is 6.7 cm, as before. No complex cyst is seen. No visible stones. Bladder: Surgically absent. Other: None. IMPRESSION: 1. Moderate right hydronephrosis which was seen on the CT yesterday but was not seen on the ultrasound from 07/24/2023. 2. Severe left hydronephrosis which was present on both prior studies. There is hypoechoic debris within some of the dilated calices which could be infectious or hemorrhagic debris. Correlate with urinalysis. 3. Atrophic right kidney with thin and echogenic cortex as before. Mild but unchanged increased echogenicity of the left kidney. 4. Bilateral renal cysts. 5. Prior cystectomy with right lower quadrant urostomy. Electronically Signed   By: Francis Quam M.D.   On: 10/17/2023 02:14   DG Chest Port 1 View Result Date: 10/16/2023 CLINICAL DATA:  Pleural effusion. EXAM: PORTABLE CHEST 1 VIEW COMPARISON:  October 15, 2023. FINDINGS: Right-sided chest tube is again noted.  No pneumothorax is noted. IMPRESSION: No definite pneumothorax seen currently. Right-sided chest tube is again noted. Electronically Signed   By: Lynwood Landy Raddle M.D.   On: 10/16/2023 09:18   DG Chest Port 1 View Result Date: 10/15/2023 CLINICAL DATA:  Thoracostomy placement EXAM: PORTABLE CHEST 1 VIEW COMPARISON:  10/14/2023 FINDINGS: Chest tube in place on the right in the inferior pleural space. Moderate right pneumothorax, estimated at 30%. No tension. Volume  loss persists at the left lung base. IMPRESSION: Chest tube in place on the right. Moderate right pneumothorax, estimated at 30%. No tension. Electronically Signed   By: Oneil Officer M.D.   On: 10/15/2023 14:51   CT CHEST ABDOMEN PELVIS WO CONTRAST Result Date: 10/15/2023 CLINICAL DATA:  Set CIS, elevated white count. EXAM: CT CHEST, ABDOMEN AND PELVIS WITHOUT CONTRAST TECHNIQUE: Multidetector CT imaging of the chest, abdomen and pelvis was performed following the standard protocol without IV contrast. RADIATION DOSE REDUCTION: This exam was performed according to the departmental dose-optimization program  which includes automated exposure control, adjustment of the mA and/or kV according to patient size and/or use of iterative reconstruction technique. COMPARISON:  CT abdomen and pelvis 09/30/2023.  Chest CT 07/19/2023 FINDINGS: CT CHEST FINDINGS Cardiovascular: Heart is normal size. Aorta is normal caliber. Diffuse 3 vessel coronary artery disease. Scattered aortic atherosclerosis. Mediastinum/Nodes: No mediastinal, hilar, or axillary adenopathy. Trachea and esophagus are unremarkable. Thyroid  unremarkable. Lungs/Pleura: Loculated moderate right pleural effusion, increasing since prior chest CT. Airspace opacity in the right lower lobe and right middle lobe could reflect atelectasis or pneumonia. Predominantly linear opacities in the lingula and left lower lobe could reflect scarring or atelectasis. Subpleural nodularity noted along the left fissure and posteriorly. Left upper lobe nodule on image 37 measures 8 mm, stable since prior study. Left upper lobe nodule on image 36 measures 4 mm, new since prior study. Musculoskeletal: Chest wall soft tissues are unremarkable. No acute bony abnormality. CT ABDOMEN PELVIS FINDINGS Hepatobiliary: 1.9 cm low-density lesion in the right hepatic lobe. 1.8 cm previously, difficult to characterize on this noncontrast study. Gallbladder unremarkable. No biliary ductal  dilatation. Pancreas: No focal abnormality or ductal dilatation. Spleen: No focal abnormality.  Normal size. Adrenals/Urinary Tract: Adrenal glands unremarkable. Persistent bilateral hydronephrosis, unchanged since prior study. Nonobstructing stones in the lower pole of the right kidney. Right kidney is atrophic. Multiple bilateral renal cysts are unchanged. Prior cystectomy with right lower quadrant urostomy. Ureters are difficult to follow without contrast. Stomach/Bowel: Moderate to large stool burden throughout the colon, similar to prior study. No bowel obstruction. Vascular/Lymphatic: Aortoiliac atherosclerosis. No evidence of aneurysm or adenopathy. 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 Other: No free fluid or free air. Musculoskeletal: Again noted is the lytic area within the left sacral ala and adjacent left iliac bone, unchanged since recent study. Degenerative disc and facet disease in the lumbar spine. IMPRESSION: Enlarging chronic loculated right pleural effusion and increasing right lower lobe airspace disease. This could reflect atelectasis or pneumonia. Nodularity noted along the left fissure and subpleural locations, increasing since prior study. Cannot exclude metastases. New 4 mm left upper lobe pulmonary nodule. Recommend attention on follow-up imaging. Diffuse coronary artery disease.  Aortic atherosclerosis. 1.9 cm low-density lesion in the right hepatic lobe, difficult to characterize on this noncontrast study. Prior cystectomy with right lower quadrant urostomy. Stable chronic bilateral hydronephrosis. Moderate to large stool burden throughout the colon. Stable lytic areas within the left sacrum and adjacent left iliac bone. Electronically Signed   By: Franky Crease M.D.   On: 10/15/2023 01:40   DG Chest Port 1 View Result Date: 10/14/2023 CLINICAL DATA:  Weakness EXAM: PORTABLE CHEST 1 VIEW COMPARISON:  09/30/2023 FINDINGS: Cardiac shadow is within normal limits.  Increased density is noted over the right hemithorax which is new from the prior exam. The appearance suggests a loculated effusion. CT of the chest may be helpful for further evaluation. Left lung is clear. No acute bony abnormality is noted IMPRESSION: Increased density over the right hemithorax suggesting a loculated effusion. CT of the chest may be helpful for further evaluation. Electronically Signed   By: Oneil Devonshire M.D.   On: 10/14/2023 22:52   CT ABDOMEN PELVIS WO CONTRAST Result Date: 09/30/2023 CLINICAL DATA:  History of bladder cancer status post cystectomy with fever. * Tracking Code: BO * EXAM: CT ABDOMEN AND PELVIS WITHOUT CONTRAST TECHNIQUE: Multidetector CT imaging of the abdomen and pelvis was performed following the standard protocol without IV contrast. RADIATION DOSE REDUCTION: This exam  was performed according to the departmental dose-optimization program which includes automated exposure control, adjustment of the mA and/or kV according to patient size and/or use of iterative reconstruction technique. COMPARISON:  CT abdomen and pelvis dated 09/17/2023 and multiple priors FINDINGS: Lower chest: Chronic right pleural effusion with adjacent medial right lower lobe rounded density, likely round atelectasis. Additional subsegmental atelectasis in the middle lobe and lingula. Irregular basilar right lower lobe nodule measuring 1.3 x 0.7 cm (5:23), increased in size from 5 x 4 mm on 07/22/2023. Unchanged right lower lobe perifissural nodules. Partially imaged heart size is normal. Coronary artery calcifications. Hepatobiliary: Ill-defined 1.8 cm hypodensity in segment 4/8 (2:13), incompletely characterized, but appears new compared to more remote studies. No intra or extrahepatic biliary ductal dilation. Gallbladder is contracted. Pancreas: No focal lesions or main ductal dilation. Spleen: Normal in size without focal abnormality. Adrenals/Urinary Tract: No adrenal nodules. Persistent bilateral  hydroureteronephrosis. Asymmetric right renal atrophy. Multifocal bilateral cysts are again seen including peripherally calcified right lower pole cysts. No renal calculi. The ureters are not well seen in the absence of contrast. Prior cystectomy. Right lower quadrant urostomy. Stomach/Bowel: Normal appearance of the stomach. No evidence of bowel wall thickening, distention, or inflammatory changes. Moderate volume stool throughout the colon. Appendix is not discretely seen. Vascular/Lymphatic: Aortic atherosclerosis. No enlarged abdominal or pelvic lymph nodes. Reproductive: Prostate brachytherapy seeds. Other: No free fluid, fluid collection, or free air. Musculoskeletal: Similar to slightly increased lytic changes of the left sacral ala. New lytic changes involving the left iliac bone adjacent to the sacroiliac joint. Degenerative changes of the hips, left-greater-than-right. Multilevel degenerative changes of the partially imaged thoracic and lumbar spine. IMPRESSION: 1. Ill-defined 1.8 cm hypodensity in segment 4/8 of the liver, incompletely characterized, but appears new compared to more remote studies. Findings are suspicious for metastatic disease. Recommend further evaluation with contrast-enhanced MRI of the abdomen. Alternatively, attention on follow-up. 2. Similar to slightly increased lytic changes of the left sacral ala. New lytic changes involving the left iliac bone adjacent to the sacroiliac joint. 3. Irregular basilar right lower lobe nodule measuring 1.3 x 0.7 cm, increased in size from 5 x 4 mm on 07/22/2023, suspicious for metastatic disease. 4. Postsurgical change from cystectomy with right lower quadrant ostomy. Persistent bilateral hydroureteronephrosis. Ureters are not well evaluated in the absence of contrast. 5. Aortic Atherosclerosis (ICD10-I70.0). Coronary artery calcifications. Assessment for potential risk factor modification, dietary therapy or pharmacologic therapy may be warranted,  if clinically indicated. Electronically Signed   By: Limin  Xu M.D.   On: 09/30/2023 20:28   DG Chest Port 1 View Result Date: 09/30/2023 CLINICAL DATA:  Fever.  Bladder cancer removed in August. EXAM: PORTABLE CHEST 1 VIEW COMPARISON:  09/02/2023 FINDINGS: The heart size and mediastinal contours are within normal limits. Both lungs are clear. The visualized skeletal structures are unremarkable. IMPRESSION: No active disease. Electronically Signed   By: Elsie Gravely M.D.   On: 09/30/2023 20:11

## 2023-10-18 NOTE — Progress Notes (Signed)
 OT Cancellation Note  Patient Details Name: Wesley [name] MRN: [id number] DOB: 11/07/1950   Cancelled Treatment:    Reason Eval/Treat Not Completed: Patient not medically ready. Per chart review, pt currently NPO and awaiting bilateral nephrostomy tube placement. OT will hold at this time and evaluate when appropriate.   Maribell Demeo L. Dalisa Forrer, OTR/L  10/18/23, 8:31 AM

## 2023-10-18 NOTE — Assessment & Plan Note (Signed)
 COVID positive on 1/6  unclear as to how much COVID-19 is contributing to patient's presentation Airborne and contact isolation In the absence of significant hypoxia we will abstain from initiating remdesivir or barcitinib

## 2023-10-18 NOTE — Progress Notes (Signed)
 PROGRESS NOTE   Wesley [name]  FMW:[id number] DOB: 04/19/1951 DOA: 10/14/2023 PCP: [name], MD   Date of Service: the patient was seen and examined on 10/18/2023  Brief Narrative:  73 y.o. 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  Of note, patient was recently hospitalized at [facility] discharged home on 10/03/2023 after a stay for sepsis secondary to UTI and was discharged on oral antibiotics.  Upon evaluation in the emergency department patient was found to be tachycardic and febrile with a white blood cell count of 26.6 as well as acute kidney injury with creatinine of 2.6.  Patient was felt to be suffering from severe sepsis with acute kidney injury.  Patient was initiated on intravenous fluids, as well as intravenous antibiotics antibiotics with vancomycin  and Zosyn .  The hospitalist group was then called to assess the patient for admission to the hospital.  Patient was found to be COVID-positive and placed on contact and airborne isolation.  PCCM was consulted concerning the right-sided pleural effusion.  After the patient was identified to have a loculated effusion Dr. Kassie with PCCM was consulted and recommended chest tube placement which was performed on 1/7 successfully and set to -20 cm of H2O.  Furthermore, patient underwent fibrinolysis of the effusion on 1/9.  Hospital course has been complicated by progressively worsening renal injury.  Renal ultrasound has revealed severe left-sided hydronephrosis and what appears to be a new substantial right-sided  hydronephrosis as well.  Case discussed with urology and is recommended the patient undergo bilateral nephrostomy tube placement with IR.     Assessment & Plan Severe sepsis (HCC) Leukocytosis resolved Thought to possibly be secondary to pneumonia complicated by right-sided pleural effusion Status post chest tube placement on 1/7, management of chest tube per PCCM, their assistance is appreciated. Presentation complicated by concurrent COVID-19 infection and what appears to be a malignant right-sided effusion. Continuing intravenous antibiotic therapy with Zosyn  per PCCM recommendations. Pleural fluid culture negative.  Cytology appears to have squamous cells concerning for metastatic malignancy per discussion between pathology and Dr. Kara. Acute renal failure superimposed on stage 3b chronic kidney disease (HCC) Creatinine show marginal improvement That being said, we do not anticipate that renal function will recover without intervention with nephrostomy tubes.  Per discussion with urology and with family they wished to proceed.  Therefore Dr. Luverne with interventional radiology was consulted and graciously proceeded with bilateral nephrostomy tube placement this evening. Will continue to gently intravenously hydrate Strict input and output monitoring Monitoring renal function and electrolytes with serial chemistries COVID-19 virus infection COVID positive on 1/6  unclear as to how much COVID-19 is contributing to patient's presentation Airborne and contact isolation In the absence of significant hypoxia we will abstain from initiating remdesivir or barcitinib Pleural effusion on right Appears to be malignant in etiology.   Status post chest tube placement 1/7 Patient additionally underwent pleural fibrinolysis by PCCM 1/9 to break up loculated effusion. Pleural fluid culture negative.  Cytology appears to have squamous cells concerning for metastatic malignancy per discussion  between pathology and Dr. Kara. Original pleural cell count appears to be transudative per lights criteria Chest  tube output continues to be rather high, chest tube will remain in place until output is less than 100cc a day.  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 Patient still voices desire for treatment and therefore Dr. Lonn with oncology graciously agreed to come speak to the patient about his overall prognosis.  Her recommendation has been supportive care and that the patient may not tolerate systemic chemotherapy at this point.   She has additionally advised that if patient does eventually resume some form of treatment that is Urologist would be leading that effort. Essential hypertension Continue amlodipine  As needed intravenous hypertensives for markedly elevated blood pressure Seizure disorder (HCC) Continue Keppra  Pressure injury of contiguous region involving left buttock and hip, stage 1 Present on admission  frequent turns Application of Mepilex to area daily Normocytic anemia No clinical evidence of bleeding however hemoglobin is continuing to downtrend Possibly due to hemodilution with intravenous fluids. Anemia workup revealing folate deficiency as well as an iron  panel suggestive of iron  deficiency and anemia of chronic disease. Continue folic acid  supplementation Continue iron  supplementation.  Goals of care, counseling/discussion Patient is suffering from numerous complications of likely metastatic urothelial malignancy Prognosis is substantially worsening with numerous complications including a malignant right-sided pleural effusion renal injury and bilateral hydronephrosis in the setting of protein calorie malnutrition and continued weight  loss Lengthy discussion with the wife surrounding the patient's guarded prognosis.  She and her husband still wish to pursue all avenues of therapy at this time however are agreeable to palliative care consultation for continued goals of care discussions.     Subjective:  Patient is seemingly lethargic today and is unable to answer questions appropriately.   Physical Exam:  Vitals:   10/17/23 1331 10/17/23 1838 10/17/23 1945 10/18/23 0648  BP: 131/79 117/72 118/69 114/79  Pulse: 100 (!) 105 97   Resp: 16 18 16 16   Temp: 98.5 F (36.9 C) 98.4 F (36.9 C) 99.3 F (37.4 C) 97.6 F (36.4 C)  TempSrc: Oral Oral Oral Oral  SpO2: 98% 94% 96% 95%  Weight:      Height:         Constitutional: Lethargic but arousable, no associated distress.   Skin: Extremely dry flaky skin with poor skin turgor.   Eyes: Pupils are equally reactive to light.  No evidence of scleral icterus or conjunctival pallor.  ENMT: Moist mucous membranes noted.  Posterior pharynx clear of any exudate or lesions.   Respiratory: Markedly diminished breath sounds in the right base with intermittent mild expiratory wheezing.   no crackles. Normal respiratory effort. No accessory muscle use.  Chest/Back: Right-sided chest tube and bilateral nephrostomy tubes now in place. Cardiovascular: Regular rate and rhythm, no murmurs / rubs / gallops. No extremity edema. 2+ pedal pulses. No carotid bruits.  Abdomen: Abdomen is soft and nontender.  No evidence of intra-abdominal masses.  Positive bowel sounds noted in all quadrants.   Musculoskeletal: No joint deformity upper and lower extremities. Good ROM, no contractures.  Poor muscle tone noted.   Data Reviewed:  I have personally reviewed and interpreted labs, imaging.  Significant findings are   CBC: Recent Labs  Lab 10/14/23 2237 10/16/23 0511 10/17/23 0506 10/18/23 0510  WBC 26.6* 17.1* 9.4 9.4  NEUTROABS 24.8*  --  7.9* 8.0*  HGB 10.3* 8.2* 7.6* 8.6*  HCT  31.4* 25.2* 23.4* 27.1*  MCV 94.9 96.2 96.3 95.1  PLT 412* 279 255 311   Basic Metabolic Panel: Recent Labs  Lab 10/14/23 2237 10/16/23 0511 10/17/23 0506 10/18/23 0510  NA 135 133* 133* 135  K 3.9 4.0 3.6 3.9  CL 109 108 105 110  CO2 15* 15* 17* 16*  GLUCOSE 123* 118* 99 115*  BUN 60* 71* 74* 70*  CREATININE 2.58* 2.91* 3.14* 2.53*  CALCIUM 10.1 9.4 9.2 9.4  MG  --   --  2.6* 2.6*   GFR: Estimated Creatinine Clearance: 17.3 mL/min (A) (by C-G formula based on SCr of 2.53 mg/dL (H)). Liver Function Tests: Recent Labs  Lab 10/14/23 2237 10/15/23 1437 10/17/23 0506 10/18/23 0510  AST 24  --  19 15  ALT 17  --  19 16  ALKPHOS 157*  --  104 95  BILITOT 0.6  --  0.4 0.4  PROT 7.0 6.5 5.6* 5.7*  ALBUMIN 2.8*  --  1.9* 2.0*    Coagulation Profile: Recent Labs  Lab 10/14/23 2237  INR 1.2   Code Status:  Full code.  Code status decision has been confirmed with: patient/ wife Family Communication: Wife is at bedside who has been updated on plan of care   Severity of Illness:  The appropriate patient status for this patient is INPATIENT. Inpatient status is judged to be reasonable and necessary in order to provide the required intensity of service to ensure the patient's safety. The patient's presenting symptoms, physical exam findings, and initial radiographic and laboratory data in the context of their chronic comorbidities is felt to place them at high risk for further clinical deterioration. Furthermore, it is not anticipated that the patient will be medically stable for discharge from the hospital within 2 midnights of admission.   * I certify that at the point of admission it is my clinical judgment that the patient will require inpatient hospital care spanning beyond 2 midnights from the point of admission due to high intensity of service, high risk for further deterioration and high frequency of surveillance required.*  Time spent:  60 minutes  Author:  Zachary JINNY Ba MD  10/18/2023 8:51 AM

## 2023-10-18 NOTE — Assessment & Plan Note (Addendum)
 Appears to be malignant in etiology.   Status post chest tube placement 1/7 Patient additionally underwent pleural fibrinolysis by PCCM 1/9 to break up loculated effusion. Pleural fluid culture negative.  Cytology appears to have squamous cells concerning for metastatic malignancy per discussion between pathology and Dr. Kara. Original pleural cell count appears to be transudative per lights criteria Chest tube output continues to be rather high, chest tube will remain in place until output is less than 100cc a day.

## 2023-10-18 NOTE — Progress Notes (Signed)
 NAME:  Wesley [name], MRN:  [id number], DOB:  1951/02/15, LOS: 3 ADMISSION DATE:  10/14/2023, CONSULTATION DATE:  1/7 REFERRING MD:  Zella, CHIEF COMPLAINT:  recurrent loculated pleural effusion    History of Present Illness:  This is a 73 year old male w/ sig h/o squamous cell carcinoma of scalp, recurrent hydronephrosis now s/p cystoprostatectomy w/ placement of ileal conduit (aug 2024) for bladder cancer. Has been hospitalized several times for recurrent urinary tract sepsis. Also followed by Dr Neda w/ our pulm group initially for loculated right pleural effusion first noted in Oct s/p urinary procedure felt symptomatic and resolved. Also noted to have Rounded density in RLL as well as RLL nodules which have been followed in the out pt setting. His most recent f/U CT of abd/pelvis obtained during admission (dec 23) for one of his recurrent sepsis admits showed small right effusion and on-going right lower lobe rounded density as well as increase in size of RLL nodule.  He was discharged 12/26 completed abx Presented back to ER 1/6 w/ cc: fever generalized weakness  x 1d In ER wbc ct 26.6, tachycardic, scr up to 2.6. On further questioning reports some increased shortness of breath since dc last.   Cultures sent. ABX started. CXR obtained showed increased density over right hemithorax raising concern for loculated effusion  A CT chest/abd/pelvis obtained showed RLL airspace disease w/ air bronchograms. LLL opacity. Mod sized loculated right effusion. The previoulsy noted rounded right LL density and new nodularity along the left subpleural location. Also showed. 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 S/p robotic cystoprostatectomy w/ placement of ileal conduit (aug 2024) Prior loculated right pleural effusion.  Uropsepsis  Seizure disorder: Oxtellar XR  600 mg daily and Keppra  500 mg three times a day  Essential tremors  UC Prior smoker Vertigo w/ falls at home  CKD stage 4  Significant Hospital Events: Including procedures, antibiotic start and stop dates in addition to other pertinent events   1/6 admitted w/ sepsis felt UT source vs HCAP w/ complicated right pleural effusion. COVID positive. Blood cultures pending. Zosyn  started  1/7 CT obtained. Marked increase in right loculated effusion. RLL PNA vs atx stable RLL density and new nodular left pleural based changes. PCCM asked to eval >right small bore chest tube placed  1/8 No issues overnight, 650 total output in [place]  1/9 output in past 24 hours, TPA/DNAse given - 1st dose  Interim History / Subjective:   No acute events overnight No issues with chest tube output.  Discussed atypical cells on cytology report with pathologist, there were few cells consistent with squamous cell origin but not enough to stain, but likely consistent with history of urothelial cancer.  I reviewed these findings with the patient and his wife.  Objective   Blood pressure 114/79, pulse 97, temperature 97.6 F (36.4 C), temperature source Oral, resp. rate 16, height 5' 4 (1.626 m), weight 47.7 kg, SpO2 95%.        Intake/Output Summary (Last 24 hours) at 10/18/2023 0757 Last data filed at 10/18/2023 0600 Gross per 24 hour  Intake 2914.23 ml  Output 2265 ml  Net 649.23 ml   Filed Weights   10/14/23 2233 10/15/23 2200  Weight: 49.7 kg 47.7 kg   Examination: General: elderly male, chronically ill appearing, frail HEENT: [place]/AT, MM pink/moist, PERRL,  Neuro: Alert and oriented x3, non-focal  CV: s1s2 regular rate and rhythm, no  murmur, rubs, or gallops,  PULM:  Slightly diminished bilaterally, no increased work of breathing, right chest tube to suction  GI: soft, bowel sounds active in all 4 quadrants, non-tender, non-distended, tolerating oral diet Extremities: warm/dry, no edema  Skin: no rashes or lesions  Resolved Hospital Problem list     Assessment & Plan:  Severe sepsis  RLL PNA Complicated right pleural effusion RLL lung density and new Left pleural based nodular density worrisome for malignancy vs mets Metastatic Bladder cancer s/p cystoprostatectomy w/ placement of ileal conduit (aug 2024)  Chronic bilateral hydronephrosis  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 Can consider pleurX drain placement in the future if fluid returns after removal of chest tube Hold off on further pleural lytic therapy Daily CXR Ensure adequate pain control  Mobilize as able  Encourage pulmonary hygiene    Best Practice (right click and Reselect all SmartList Selections daily)  Per primary   Critical care time: NA    Dorn Chill, MD  Pulmonary & Critical Care Office: [phone]   See Amion for personal pager PCCM on call pager [phone] until 7pm. Please call Elink 7p-7a. [phone]

## 2023-10-19 DIAGNOSIS — U071 COVID-19: Secondary | ICD-10-CM | POA: Diagnosis not present

## 2023-10-19 DIAGNOSIS — A419 Sepsis, unspecified organism: Secondary | ICD-10-CM | POA: Diagnosis not present

## 2023-10-19 DIAGNOSIS — A491 Streptococcal infection, unspecified site: Secondary | ICD-10-CM

## 2023-10-19 DIAGNOSIS — N179 Acute kidney failure, unspecified: Secondary | ICD-10-CM | POA: Diagnosis not present

## 2023-10-19 DIAGNOSIS — R6520 Severe sepsis without septic shock: Secondary | ICD-10-CM | POA: Diagnosis not present

## 2023-10-19 DIAGNOSIS — I10 Essential (primary) hypertension: Secondary | ICD-10-CM | POA: Diagnosis not present

## 2023-10-19 LAB — CBC WITH DIFFERENTIAL/PLATELET
Abs Immature Granulocytes: 0.04 10*3/uL (ref 0.00–0.07)
Basophils Absolute: 0 10*3/uL (ref 0.0–0.1)
Basophils Relative: 0 %
Eosinophils Absolute: 0 10*3/uL (ref 0.0–0.5)
Eosinophils Relative: 0 %
HCT: 31.7 % — ABNORMAL LOW (ref 39.0–52.0)
Hemoglobin: 10 g/dL — ABNORMAL LOW (ref 13.0–17.0)
Immature Granulocytes: 0 %
Lymphocytes Relative: 4 %
Lymphs Abs: 0.5 10*3/uL — ABNORMAL LOW (ref 0.7–4.0)
MCH: 30.1 pg (ref 26.0–34.0)
MCHC: 31.5 g/dL (ref 30.0–36.0)
MCV: 95.5 fL (ref 80.0–100.0)
Monocytes Absolute: 0.6 10*3/uL (ref 0.1–1.0)
Monocytes Relative: 5 %
Neutro Abs: 11.5 10*3/uL — ABNORMAL HIGH (ref 1.7–7.7)
Neutrophils Relative %: 91 %
Platelets: 348 10*3/uL (ref 150–400)
RBC: 3.32 MIL/uL — ABNORMAL LOW (ref 4.22–5.81)
RDW: 15.9 % — ABNORMAL HIGH (ref 11.5–15.5)
WBC: 12.6 10*3/uL — ABNORMAL HIGH (ref 4.0–10.5)
nRBC: 0 % (ref 0.0–0.2)

## 2023-10-19 LAB — CULTURE, BLOOD (ROUTINE X 2)
Culture: NO GROWTH
Special Requests: ADEQUATE

## 2023-10-19 LAB — COMPREHENSIVE METABOLIC PANEL
ALT: 16 U/L (ref 0–44)
AST: 32 U/L (ref 15–41)
Albumin: 2.2 g/dL — ABNORMAL LOW (ref 3.5–5.0)
Alkaline Phosphatase: 102 U/L (ref 38–126)
Anion gap: 13 (ref 5–15)
BUN: 63 mg/dL — ABNORMAL HIGH (ref 8–23)
CO2: 15 mmol/L — ABNORMAL LOW (ref 22–32)
Calcium: 10.3 mg/dL (ref 8.9–10.3)
Chloride: 111 mmol/L (ref 98–111)
Creatinine, Ser: 2.44 mg/dL — ABNORMAL HIGH (ref 0.61–1.24)
GFR, Estimated: 27 mL/min — ABNORMAL LOW (ref >60)
Glucose, Bld: 114 mg/dL — ABNORMAL HIGH (ref 70–99)
Potassium: 4.5 mmol/L (ref 3.5–5.1)
Sodium: 139 mmol/L (ref 135–145)
Total Bilirubin: 0.6 mg/dL (ref 0.0–1.2)
Total Protein: 6.5 g/dL (ref 6.5–8.1)

## 2023-10-19 LAB — MAGNESIUM: Magnesium: 2.5 mg/dL — ABNORMAL HIGH (ref 1.7–2.4)

## 2023-10-19 MED ORDER — LINEZOLID 600 MG/300ML IV SOLN
600.0000 mg | Freq: Two times a day (BID) | INTRAVENOUS | Status: DC
  Administered 2023-10-20 (×2): 600 mg via INTRAVENOUS
  Filled 2023-10-19 (×3): qty 300

## 2023-10-19 MED ORDER — LACTATED RINGERS IV SOLN: INTRAVENOUS | Status: AC

## 2023-10-19 MED ORDER — OXYCODONE HCL 5 MG PO TABS
5.0000 mg | ORAL_TABLET | ORAL | Status: DC | PRN
Start: 2023-10-19 — End: 2023-10-26
  Administered 2023-10-19 – 2023-10-20 (×4): 5 mg via ORAL
  Administered 2023-10-21 – 2023-10-23 (×2): 7.5 mg via ORAL
  Administered 2023-10-24 – 2023-10-25 (×2): 5 mg via ORAL
  Filled 2023-10-19: qty 1
  Filled 2023-10-19: qty 2
  Filled 2023-10-19: qty 1
  Filled 2023-10-19: qty 2
  Filled 2023-10-19 (×5): qty 1
  Filled 2023-10-19: qty 2

## 2023-10-19 NOTE — Assessment & Plan Note (Addendum)
 Creatinine slowly downtrending Bilateral hydronephrosis identified during this hospitalization Patient status post bilateral nephrostomy tube placement 1/10 Will continue to intravenously hydrate Concurrent significant right-sided effusion output complicating patient's fluid status. Strict input and output monitoring Monitoring renal function and electrolytes with serial chemistries

## 2023-10-19 NOTE — Assessment & Plan Note (Addendum)
 Present on admission  frequent turns Application of Mepilex to area daily

## 2023-10-19 NOTE — Assessment & Plan Note (Addendum)
 Imaging studies concerning for lung metastases, bone metastases and peritoneal metastases with evidence of likely right-sided malignant effusion Performance status is worsening with worsening prognosis. Patient still voices desire for consideration for all treatment options and therefore Dr. Lonn with oncology graciously agreed to come speak to the patient about his overall prognosis on 1/10.  Her recommendation has been supportive care and that the patient may not tolerate systemic chemotherapy at this point.   She has additionally advised that if patient does eventually resume some form of treatment that is Urologist would be leading that effort.

## 2023-10-19 NOTE — Assessment & Plan Note (Addendum)
 Mild leukocytosis today. Sepsis thought to be secondary to pneumonia complicated by right-sided pleural effusion Status post chest tube placement on 1/7, management of chest tube per PCCM, their assistance is appreciated.  Pulmonology recommending tube to stay in place until patient exhibits less than 100 cc of Pleur-evac output daily. Presentation complicated by concurrent COVID-19 infection and what appears to be a malignant right-sided effusion. Continuing intravenous antibiotic therapy with Zosyn  per PCCM recommendations. Pleural fluid culture negative.  Cytology appears to have squamous cells concerning for metastatic malignancy per discussion between pathology and Dr. Kara 1/10.

## 2023-10-19 NOTE — Plan of Care (Signed)
   Problem: Education: Goal: Knowledge of risk factors and measures for prevention of condition will improve Outcome: Progressing   Problem: Coping: Goal: Psychosocial and spiritual needs will be supported Outcome: Progressing   Problem: Respiratory: Goal: Will maintain a patent airway Outcome: Progressing Goal: Complications related to the disease process, condition or treatment will be avoided or minimized Outcome: Progressing

## 2023-10-19 NOTE — Evaluation (Signed)
 Occupational Therapy Evaluation Patient Details Name: Wesley [name] MRN: [id number] DOB: 10-Feb-1951 Today's Date: 10/19/2023   History of Present Illness 73 yo male admitted with Pna, Sepsis, pleural effusion. S/P chest tube placement. Hx of essential tremor, anemia, hiatal heernia, meningioma resection with residual hearing loss, CKD, ulcerative colitis, Sz d/o, TURBT, bony mets, falls, vertigo, bladder Ca, squamous cell ca, COVID   Clinical Impression   Pt admitted with the above diagnoses and presents with below problem list. Pt will benefit from continued acute OT to address the below listed deficits and maximize independence with basic ADLs prior to d/c. At baseline, pt is independent with ADLs, lives with wife and son. Pt currently needs up to min A with ADLs and stand-pivot transfers. Spouse present throughout session. Pt DOE 2/4, fatigues easily.        If plan is discharge home, recommend the following: A little help with walking and/or transfers;A little help with bathing/dressing/bathroom;Assist for transportation    Functional Status Assessment  Patient has had a recent decline in their functional status and demonstrates the ability to make significant improvements in function in a reasonable and predictable amount of time.  Equipment Recommendations  BSC/3in1;Other (comment) (w/c for functional mobility if unable to walk distance to bathroom prior to d/c)    Recommendations for Other Services       Precautions / Restrictions Precautions Precautions: Fall Precaution Comments: chest tube, bilateral nephrostomy tubes Restrictions Weight Bearing Restrictions Per Provider Order: No      Mobility Bed Mobility Overal bed mobility: Needs Assistance Bed Mobility: Sit to Supine       Sit to supine: Contact guard assist, HOB elevated   General bed mobility comments: increased time and effort. cueing for technique with line management    Transfers Overall transfer  level: Needs assistance Equipment used: Rolling walker (2 wheels) Transfers: Sit to/from Stand Sit to Stand: Min assist, From elevated surface           General transfer comment: assist to powerup, steady and to fully advance to seated surface prior to sitting. cues for hand placement      Balance Overall balance assessment: Needs assistance   Sitting balance-Leahy Scale: Fair     Standing balance support: Bilateral upper extremity supported, During functional activity, Reliant on assistive device for balance Standing balance-Leahy Scale: Poor                             ADL either performed or assessed with clinical judgement   ADL Overall ADL's : Needs assistance/impaired Eating/Feeding: Set up;Sitting   Grooming: Set up;Sitting   Upper Body Bathing: Minimal assistance;Sitting;Set up   Lower Body Bathing: Minimal assistance;Sit to/from stand   Upper Body Dressing : Set up;Sitting;Minimal assistance   Lower Body Dressing: Minimal assistance;Sit to/from stand   Toilet Transfer: Minimal assistance;Stand-pivot;BSC/3in1;Rolling walker (2 wheels)   Toileting- Clothing Manipulation and Hygiene: Minimal assistance;Sit to/from stand;Contact guard assist         General ADL Comments: Pt completed stand pivot transfer recliner>EOB then returned to supine. min assist to advance rw, steady, and fully advance BLE onto bed. DOE 2/4.     Vision         Perception         Praxis         Pertinent Vitals/Pain Pain Assessment Pain Assessment: Faces Faces Pain Scale: Hurts little more Pain Location: abdomen Pain Descriptors / Indicators: Grimacing Pain Intervention(s):  Monitored during session, Limited activity within patient's tolerance, Repositioned     Extremity/Trunk Assessment Upper Extremity Assessment Upper Extremity Assessment: Generalized weakness   Lower Extremity Assessment Lower Extremity Assessment: Defer to PT evaluation;Generalized  weakness       Communication Communication Communication: No apparent difficulties   Cognition Arousal: Alert Behavior During Therapy: WFL for tasks assessed/performed Overall Cognitive Status: Within Functional Limits for tasks assessed                                       General Comments  Spouse and son present    Exercises     Shoulder Instructions      Home Living Family/patient expects to be discharged to:: Private residence Living Arrangements: Spouse/significant other Available Help at Discharge: Family Type of Home: House Home Access: Stairs to enter Secretary/administrator of Steps: 1   Home Layout: Two level;Able to live on main level with bedroom/bathroom     Bathroom Shower/Tub: Walk-in shower         Home Equipment: None          Prior Functioning/Environment Prior Level of Function : Independent/Modified Independent                        OT Problem List: Decreased strength;Decreased activity tolerance;Impaired balance (sitting and/or standing);Decreased knowledge of use of DME or AE;Cardiopulmonary status limiting activity;Decreased knowledge of precautions;Pain      OT Treatment/Interventions: Self-care/ADL training;Therapeutic exercise;Energy conservation;DME and/or AE instruction;Therapeutic activities;Patient/family education;Balance training    OT Goals(Current goals can be found in the care plan section) Acute Rehab OT Goals Patient Stated Goal: not stated OT Goal Formulation: With patient/family Time For Goal Achievement: 11/02/23 Potential to Achieve Goals: Fair ADL Goals Pt Will Perform Grooming: with set-up;sitting;with contact guard assist;standing Pt Will Perform Lower Body Bathing: with modified independence;with supervision;sit to/from stand Pt Will Perform Lower Body Dressing: with modified independence;with supervision;sit to/from stand Pt Will Transfer to Toilet: with contact guard  assist;ambulating Pt Will Perform Toileting - Clothing Manipulation and hygiene: with contact guard assist;sit to/from stand;sitting/lateral leans;with min assist Additional ADL Goal #1: Pt will complete bed mobility at mod I level to prepare for EOB/OOB ADLs.  OT Frequency: Min 2X/week    Co-evaluation              AM-PAC OT 6 Clicks Daily Activity     Outcome Measure Help from another person eating meals?: None Help from another person taking care of personal grooming?: A Little Help from another person toileting, which includes using toliet, bedpan, or urinal?: A Little Help from another person bathing (including washing, rinsing, drying)?: A Little Help from another person to put on and taking off regular upper body clothing?: A Little Help from another person to put on and taking off regular lower body clothing?: A Little 6 Click Score: 19   End of Session Equipment Utilized During Treatment: Rolling walker (2 wheels);Oxygen  Activity Tolerance: Patient limited by fatigue;Other (comment) (DOE 2/4) Patient left: in bed;with call bell/phone within reach;with family/visitor present;with bed alarm set  OT Visit Diagnosis: Unsteadiness on feet (R26.81);Muscle weakness (generalized) (M62.81);Pain                Time: 8842-8773 OT Time Calculation (min): 29 min Charges:  OT General Charges $OT Visit: 1 Visit OT Evaluation $OT Eval Low Complexity: 1 Low OT Treatments $Self Care/Home  Management : 8-22 mins  Lamarr Hoots, OT Acute Rehabilitation Services Office: [phone]   Hoots Lamarr DEL 10/19/2023, 1:33 PM

## 2023-10-19 NOTE — Consult Note (Signed)
 Consultation Note Date: 10/19/2023   Patient Name: Wesley [name]  DOB: [birth year]  MRN: [id number]  Age / Sex: 73 y.o., male  PCP: [name], MD Referring Physician: Kenard Zachary PARAS, MD  Reason for Consultation: Establishing goals of care, Non pain symptom management, and Pain control  HPI/Patient Profile: 73 y.o. male  with past medical history of   admitted on 10/14/2023 with   .   Clinical Assessment and Goals of Care: 73 year old gentleman with life limiting illness of metastatic urothelial cancer of the bladder.  Patient admitted with acute on chronic renal failure, severe protein calorie malnutrition and constipation.  Patient has imaging studies suspicious for lung metastases, bone metastases also with peritoneal metastases and skin metastases.  Has ongoing decline as far as functional status.  Follows with medical oncology.  Underwent IR nephrostomy placement. Palliative care consulted as an extra layer of support for symptom management as well as for ongoing goals of care discussions. Palliative consult request received.  Chart reviewed.  Patient seen and examined.  Patient and wife present in the room.  Patient recently underwent OT evaluation. Palliative medicine is specialized medical care for people living with serious illness. It focuses on providing relief from the symptoms and stress of a serious illness. The goal is to improve quality of life for both the patient and the family. Goals of care: Broad aims of medical therapy in relation to the patient's values and preferences. Our aim is to provide medical care aimed at enabling patients to achieve the goals that matter most to them, given the circumstances of their particular medical situation and their constraints.  Pain and non-- pain symptom management options discussed.  Goals of care discussions also undertaken.  Overall, patient's  remains hopeful for stabilization/recovery so that he may end up qualifying for further treatments.  We discussed about symptom management.  Offered active listening, supportive empathic presence and acknowledged the patient's hopes for his care today, at the time of this initial palliative consultation.  NEXT OF KIN Wife Marval present at bedside.  Patient lives at home with wife and the son in [place], [place] .  SUMMARY OF RECOMMENDATIONS   Full code/full scope care Patient and family wish to continue efforts at improving functional status as well as oral intake. Opioid rotate from Percocet to oxycodone  immediate release for establishing better analgesia, continue current bowel regimen Recommend outpatient palliative-palliative support at [facility] as well as home with palliative services upon discharge.  Patient's wife also asking whether the patient will benefit from home-based PT/OT. Thank you for the consult.  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 The following aspects are pertinent.  Past Medical History:  Diagnosis Date   Arthritis    Atypical chest pain    cardiologist-- dr gerard;  chronic occasional,  pressure / discomfort, LOV 07/14/21 (as of 06/08/22)   Bladder calculus    BPH with obstruction/lower urinary tract  symptoms    Follows with Dr. Valli Shank, urologist.   Complex partial seizure [facility])    followed by neurologist--- dr d. 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 Gretel at [facility] neurology, LOV w/ FNP on 03/21/22 in Epic ( as of 06/08/22)   Generalized abdominal pain    w/  bloating due to UC  take doxepin    GERD (gastroesophageal reflux disease)    H/O urinary retention    History of bladder stone    History of hiatal hernia    History of kidney stones    History of prostate cancer 2010   urologist--- dr pace;   dx 2010,  Gleason 4+3, PSA 5.2;   03-04-2009  s/p radiactive prostate seed implants and external beam radiation   History of repair of hiatal hernia    02-11-2017  s/p lap paraesophageal hernia repair w/ nissen fundoplication   History of skin cancer    Hx of resection of meningioma 02/11/2009   benign right temperal lobe meningioma s/p resection  (residual hearing loss)   (neurosurgeon-- dr madilyn)   Hypertension    followed by cardiologist and pcp   Insomnia    Migraine with aura and without status migrainosus, not intractable    neurologist-- dr alm gretel   Prostate cancer [facility])    Simple renal cyst    bilateral ;   followed by urologist   Ulcerative colitis (HCC)    followed by dr v. shana (GI);  dx 2010;  current treatment stelara q8wks, LOV 06/07/22 (as of 06/08/22)   Wears glasses    Social History   Socioeconomic History   Marital status: Married    Spouse name: Not on file   Number of children: Not on file   Years of education: Not on file   Highest education level: Not on file  Occupational History   Not on file  Tobacco Use   Smoking status: Former    Current packs/day: 0.00    Average packs/day: 1 pack/day for 10.0 years (10.0 ttl pk-yrs)    Types:  Cigarettes    Start date: 03/01/1977    Quit date: 03/02/1987    Years since quitting: 36.6   Smokeless tobacco: Never  Vaping Use   Vaping status: Never Used  Substance and Sexual Activity   Alcohol use: Yes    Comment: Rare   Drug use: Never   Sexual activity: Not Currently  Other Topics Concern   Not on file  Social History Narrative   Not on file   Social Drivers of Health   Financial Resource Strain: Low Risk  (09/26/2023)   Received from [facility]   Overall Financial Resource Strain (CARDIA)    Difficulty of Paying Living Expenses: Not hard at all  Food Insecurity: No Food Insecurity (10/15/2023)   Hunger  Vital Sign    Worried About Programme Researcher, Broadcasting/film/video in the Last Year: Never true    Ran Out of Food in the Last Year: Never true  Transportation Needs: No Transportation Needs (10/15/2023)   PRAPARE - Administrator, Civil Service (Medical): No    Lack of Transportation (Non-Medical): No  Physical Activity: Insufficiently Active (09/26/2023)   Received from [facility]   Exercise Vital Sign    Days of Exercise per Week: 2 days    Minutes of Exercise per Session: 30 min  Stress: No Stress Concern Present (09/26/2023)   Received from [facility] of Occupational Health - Occupational Stress Questionnaire    Feeling of Stress : Not at all  Social Connections: Socially Integrated (10/15/2023)   Social Connection and Isolation Panel [NHANES]    Frequency of Communication with Friends and Family: More than three times a week    Frequency of Social Gatherings with Friends and Family: Once a week    Attends Religious Services: More than 4 times per year    Active Member of [employer] or Organizations: Yes    Attends Engineer, Structural: More than 4 times per year    Marital Status: Married   History reviewed. No pertinent family history. Scheduled Meds:  amLODipine   5 mg Oral QHS   doxepin   25 mg Oral QHS   feeding supplement  237 mL  Oral BID BM   fluticasone  furoate-vilanterol  1 puff Inhalation Daily   folic acid   1 mg Oral Daily   heparin  injection (subcutaneous)  5,000 Units Subcutaneous Q8H   levETIRAcetam   500 mg Oral BID   linaclotide   145 mcg Oral Daily   multivitamin with minerals  1 tablet Oral Daily   OXcarbazepine  ER  600 mg Oral Q1500   polyethylene glycol  17 g Oral Daily   primidone   100 mg Oral QHS   senna-docusate  2 tablet Oral BID   sodium chloride  flush  10 mL Intrapleural Q8H   sodium chloride  flush  10 mL Intrapleural Q8H   sodium chloride  flush  5 mL Intracatheter Q8H   Continuous Infusions:  piperacillin -tazobactam (ZOSYN )  IV 2.25 g (10/19/23 0854)   PRN Meds:.acetaminophen  **OR** acetaminophen , meclizine , ondansetron  **OR** ondansetron  (ZOFRAN ) IV, oxyCODONE , traZODone  Medications Prior to Admission:  Prior to Admission medications   Medication Sig Start Date End Date Taking? Authorizing Provider  amLODipine  (NORVASC ) 5 MG tablet Take 5 mg by mouth at bedtime.   Yes [provider]  BREO ELLIPTA  100-25 MCG/ACT AEPB INHALE 1 PUFF BY MOUTH EVERY DAY Patient taking differently: Inhale 1 puff into the lungs daily. 06/25/23  Yes Olalere, Adewale A, MD  celecoxib (CELEBREX) 100 MG capsule Take 100 mg by mouth every evening.   Yes [provider]  doxepin  (SINEQUAN ) 25 MG capsule Take 25 mg by mouth at bedtime.   Yes [provider]  ferrous sulfate  325 (65 FE) MG tablet Take 325 mg by mouth every other day.   Yes [provider]  finasteride  (PROSCAR ) 5 MG tablet Take 5 mg by mouth daily at 12 noon. 02/05/13  Yes [provider]  ketoconazole (NIZORAL) 2 % cream Apply 1 Application topically daily as needed for irritation.   Yes [provider]  ketoconazole (NIZORAL) 2 % shampoo Apply 1 Application topically daily as needed (yeast).   Yes [provider]  levETIRAcetam  (KEPPRA ) 500 MG tablet Take 500 mg by mouth 3 (three)  times daily.    Yes [provider]  linaclotide  (LINZESS ) 145 MCG CAPS capsule Take 145 mcg by mouth daily. 09/26/23 03/24/24 Yes [provider]  mirtazapine  (REMERON ) 15 MG tablet Take 1 tablet (15 mg total) by mouth at bedtime. 09/23/23  Yes Olalere, Adewale A, MD  OXcarbazepine  ER (OXTELLAR XR ) 600 MG TB24 Take 600 mg by mouth daily in the afternoon.   Yes [provider]  polyethylene glycol (MIRALAX  / GLYCOLAX ) 17 g packet Take 17 g by mouth 2 (two) times daily. Patient taking differently: Take 17 g by mouth daily as needed for moderate constipation. 07/25/23  Yes Cheryle Page, MD  primidone  (MYSOLINE ) 50 MG tablet Take 100 mg by mouth at bedtime.   Yes [provider]  vitamin B-12 (CYANOCOBALAMIN) 500 MCG tablet Take 500 mcg by mouth daily.   Yes [provider]  Meclizine  HCl 25 MG CHEW Chew 1 tablet by mouth every 6 (six) hours as needed (dizziness). Patient not taking: Reported on 10/15/2023    [provider]  ustekinumab (STELARA) 90 MG/ML SOSY injection Inject 90 mg into the skin every 8 (eight) weeks.    [provider]   Allergies  Allergen Reactions   Sulfa Antibiotics Rash    Childhood   Review of Systems Complains of left lower quadrant abdominal pain Physical Exam Awake alert resting in bed Appears weak and cachectic Has keratosis on skin, left flank pain and left lower quadrant pain suspicious for metastatic deposit Regular work of breathing Has muscle wasting Awake alert oriented, answers all questions appropriately  Vital Signs: BP 135/83 (BP Location: Left Arm)   Pulse (!) 103   Temp 98 F (36.7 C) (Oral)   Resp 16   Ht 5' 4 (1.626 m)   Wt 47.7 kg   SpO2 98%   BMI 18.05 kg/m  Pain Scale: 0-10   Pain Score: 0-No pain   SpO2: SpO2: 98 % O2 Device:SpO2: 98 % O2 Flow Rate: .O2 Flow Rate (L/min): 2 L/min  IO: Intake/output summary:  Intake/Output Summary (Last 24 hours) at 10/19/2023 1323 Last data  filed at 10/19/2023 0915 Gross per 24 hour  Intake 1889.25 ml  Output 1675 ml  Net 214.25 ml    LBM: Last BM Date :  (pt & wife unable to recall) Baseline Weight: Weight: 49.7 kg Most recent weight: Weight: 47.7 kg     Palliative Assessment/Data:   Palliative performance scale 60%  Time In: 12.20 Time Out: 1320 Time Total: 60 Greater than 50%  of this time was spent counseling and coordinating care related to the above assessment and plan.  Signed by: Lonia Serve, MD   Please contact Palliative Medicine Team phone at [phone] for questions and concerns.  For individual provider: See Tracey

## 2023-10-19 NOTE — Assessment & Plan Note (Addendum)
 No clinical evidence of bleeding however hemoglobin is continuing to downtrend Possibly due to hemodilution with intravenous fluids. Anemia workup revealing folate deficiency as well as an iron  panel suggestive of iron  deficiency and anemia of chronic disease. Continue folic acid  supplementation Continue iron  supplementation.

## 2023-10-19 NOTE — Assessment & Plan Note (Addendum)
 Appears to be malignant in etiology.   Status post chest tube placement 1/7 Patient additionally underwent pleural fibrinolysis by PCCM 1/9 to break up loculated effusion. Pleural fluid culture negative.  Cytology appears to have squamous cells concerning for metastatic malignancy per discussion between pathology and Dr. Kara. Chest tube output continues to be rather high, chest tube will remain in place until output is less than 100cc a day. If chest tube output does not come down patient will likely need Pleurx tube prior to discharge.

## 2023-10-19 NOTE — Assessment & Plan Note (Addendum)
 Continue Keppra

## 2023-10-19 NOTE — Assessment & Plan Note (Signed)
 Patient is suffering from numerous complications of likely metastatic urothelial malignancy Prognosis is substantially worsening with numerous complications including a malignant right-sided pleural effusion renal injury and bilateral hydronephrosis in the setting of protein calorie malnutrition and continued weight loss Lengthy discussion with the wife surrounding the patient's guarded prognosis.  She and her husband still wish to pursue all avenues of therapy at this time however are agreeable to palliative care consultation for continued goals of care discussions.

## 2023-10-19 NOTE — Assessment & Plan Note (Addendum)
 Continue amlodipine As needed intravenous hypertensives for markedly elevated blood pressure

## 2023-10-19 NOTE — Progress Notes (Signed)
 Pharmacy Antibiotic Note  Wesley [name] is a 73 y.o. male admitted on 10/14/2023 with sepsis secondary to  HCAP  vs. recurrent UTI. Pharmacy has been consulted for Zosyn  dosing.  PMH includes past medical history of bladder cancer status post cystectomy and ileal conduit.     Plan: Continue Zosyn  2.25g IV q6h for PNA indication for CrCl < 20 ml/min Monitor renal function, cultures, clinical course  Height: 5' 4 (162.6 cm) Weight: 47.7 kg (105 lb 2.6 oz) IBW/kg (Calculated) : 59.2  Temp (24hrs), Avg:98 F (36.7 C), Min:97.9 F (36.6 C), Max:98.1 F (36.7 C)  Recent Labs  Lab 10/14/23 2237 10/14/23 2250 10/16/23 0511 10/17/23 0506 10/18/23 0510 10/18/23 1213 10/19/23 0759  WBC 26.6*  --  17.1* 9.4 9.4  --  12.6*  CREATININE 2.58*  --  2.91* 3.14* 2.53* 2.59* 2.44*  LATICACIDVEN  --  1.2  --   --   --   --   --     Estimated Creatinine Clearance: 17.9 mL/min (A) (by C-G formula based on SCr of 2.44 mg/dL (H)).    Allergies  Allergen Reactions   Sulfa Antibiotics Rash    Childhood    Antimicrobials this admission: 1/6 cefepime  x1  1/7 Zosyn  >>   Microbiology results: 1/6 BCx: ngtd 1/6 UCx: > 100K Klebsiella pneumoniae (sensitive to Zosyn ), Enterococcus gallinarum (sensitive to ampicillin; Zosyn  activity similar) 1/6 COVID+ 1/7 pleural fluid: ngtd 1/7 MRSA PCR: neg   Thank you for allowing pharmacy to be a part of this patient's care.  Eleanor EMERSON Agent, PharmD, BCPS Clinical Pharmacist [place] 10/19/2023 10:40 AM

## 2023-10-19 NOTE — Assessment & Plan Note (Addendum)
 COVID positive on 1/6  unclear as to how much COVID-19 is contributing to patient's presentation Airborne and contact isolation through 1/16 In the absence of significant hypoxia we will abstain from initiating remdesivir or barcitinib

## 2023-10-19 NOTE — Assessment & Plan Note (Signed)
 Imaging studies concerning for lung metastases, bone metastases and peritoneal metastases with evidence of likely right-sided malignant effusion Performance status is worsening with worsening prognosis. Patient still voices desire for treatment and therefore Dr. Lonn with oncology graciously agreed to come speak to the patient about his overall prognosis.  Her recommendation has been supportive care and that the patient may not tolerate systemic chemotherapy at this point.   She has additionally advised that if patient does eventually resume some form of treatment that is Urologist would be leading that effort.

## 2023-10-19 NOTE — Progress Notes (Signed)
 Brief PCCM follow-up note  Chest tube output 500 cc over the last 24 hours Note that Dr. Kara had discussions with pathologist regarding questionable cytology with squamous cell CA.  Will continue to follow output, leave chest tube in place for now.  Goal to get it out when output goes below 100 cc per 24 hours.  He may be a candidate for Pleurx going forward  PCCM will continue to follow  Wesley Chris, MD, PhD 10/19/2023, 10:29 AM [facility] [phone] or if no answer before 7:00PM call [phone] For any issues after 7:00PM please call eLink [phone]

## 2023-10-19 NOTE — Assessment & Plan Note (Addendum)
 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  We had another follow-up discussion surrounding goals of care with son present today.  Patient and his wife still wish to pursue all avenues of therapy at this time.  Patient is full code. Palliative care following, their input is appreciated.

## 2023-10-19 NOTE — Assessment & Plan Note (Signed)
 Urine culture 1/6 growing out Klebsiella and Enterococcus Gallinarum, a VRE.  Initially there was a question as to whether this was clinically significant however upon urologic consultation they have recommended targeted therapy for the VRE and therefore linezolid  is being initiated today.

## 2023-10-19 NOTE — Progress Notes (Signed)
 PROGRESS NOTE   Wesley [name]  FMW:[id number] DOB: September 13, 1951 DOA: 10/14/2023 PCP: [name], MD   Date of Service: the patient was seen and examined on 10/19/2023  Brief Narrative:  73 y.o. 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  Of note, patient was recently hospitalized at [facility] discharged home on 10/03/2023 after a stay for sepsis secondary to UTI and was discharged on oral antibiotics.  Upon evaluation in the emergency department patient was found to be tachycardic and febrile with a white blood cell count of 26.6 as well as acute kidney injury with creatinine of 2.6.  Patient was felt to be suffering from severe sepsis with acute kidney injury.  Patient was initiated on intravenous fluids, as well as intravenous antibiotics antibiotics with vancomycin  and Zosyn .  The hospitalist group was then called to assess the patient for admission to the hospital.  Patient was found to be COVID-positive and placed on contact and airborne isolation.  PCCM was consulted concerning the right-sided pleural effusion.  After the patient was identified to have a loculated effusion Dr. Kassie with PCCM was consulted and recommended chest tube placement which was performed on 1/7 successfully and set to -20 cm of H2O.  Furthermore, patient underwent fibrinolysis of the effusion on 1/9.  Pleural fluid cultures remained without growth.  Pleural fluid cytology however did reveal atypical squamous cells.  While appropriate staining could not be performed this was suspicious for malignant  urothelial metastatic spread per their discussion between Dr. Kara and pathology on 1/10.  Dr. Lonn with medical oncology was consulted who felt that with patient's poor performance status recommended supportive care for now with the patient to follow-up with both urology and oncology as an outpatient for consideration of chemotherapy when the patient physically improves.  Hospital course had been complicated by progressively worsening renal injury.  Renal ultrasound has revealed severe left-sided hydronephrosis and what appeared to be a new substantial right-sided hydronephrosis as well.  Case discussed with Dr. Carolynn with urology who graciously agreed to consult and they recommended the patient undergo bilateral nephrostomy tube placement with IR.  Patient therefore underwent bilateral nephrostomy tube placement on 1/10.  With the multiple complications noted above and patient's continued physical decline goals of care were discussed.  A palliative care consultation was obtained.  Patient and family wish to keep the patient full code and continue to pursue all avenues of care at this time.     Assessment & Plan Severe sepsis (HCC) Mild leukocytosis today. Sepsis thought to be secondary to pneumonia complicated by right-sided pleural effusion Status post chest tube placement on 1/7, management of chest tube per PCCM, their assistance is appreciated.  Pulmonology recommending tube to stay in place until patient exhibits less than 100 cc of Pleur-evac output daily. Presentation complicated by concurrent COVID-19 infection and what appears to be a malignant right-sided effusion. Continuing intravenous antibiotic therapy with Zosyn  per PCCM recommendations. Pleural fluid culture negative.  Cytology appears to have squamous cells concerning for metastatic malignancy per discussion between pathology and Dr. Kara 1/10. Acute renal failure superimposed on stage 3b chronic kidney disease  (HCC) Creatinine slowly  downtrending Bilateral hydronephrosis identified during this hospitalization Patient status post bilateral nephrostomy tube placement 1/10 Will continue to intravenously hydrate Concurrent significant right-sided effusion output complicating patient's fluid status. Strict input and output monitoring Monitoring renal function and electrolytes with serial chemistries VRE (vancomycin -resistant Enterococci) infection Urine culture 1/6 growing out Klebsiella and Enterococcus Gallinarum, a VRE.  Initially there was a question as to whether this was clinically significant however upon urologic consultation they have recommended targeted therapy for the VRE and therefore linezolid  is being initiated today. COVID-19 virus infection COVID positive on 1/6  unclear as to how much COVID-19 is contributing to patient's presentation Airborne and contact isolation through 1/16 In the absence of significant hypoxia we will abstain from initiating remdesivir or barcitinib Pleural effusion on right Appears to be malignant in etiology.   Status post chest tube placement 1/7 Patient additionally underwent pleural fibrinolysis by PCCM 1/9 to break up loculated effusion. Pleural fluid culture negative.  Cytology appears to have squamous cells concerning for metastatic malignancy per discussion between pathology and Dr. Kara. Chest tube output continues to be rather high, chest tube will remain in place until output is less than 100cc a day. If chest tube output does not come down patient will likely need Pleurx tube prior to discharge.  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 Patient still voices desire for consideration for all treatment options and therefore Dr.  Lonn with oncology graciously agreed to come speak to the patient about his overall prognosis on 1/10.  Her recommendation has been supportive care and that the patient may not tolerate systemic chemotherapy at this point.   She has additionally advised that if patient does eventually resume some form of treatment that is Urologist would be leading that effort. 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  We had another follow-up discussion surrounding goals of care with son present today.  Patient and his wife still wish to pursue all avenues of therapy at this time.  Patient is full code. Palliative care following, their input is appreciated. Essential hypertension Continue amlodipine  As needed intravenous hypertensives for markedly elevated blood pressure Seizure disorder (HCC) Continue Keppra  Pressure injury of contiguous region involving left buttock and hip, stage 1 Present on admission  frequent turns Application of Mepilex to area daily Normocytic anemia No clinical evidence of bleeding however hemoglobin is continuing to downtrend Possibly due to hemodilution with intravenous fluids. Anemia workup revealing folate deficiency as well as an iron  panel suggestive of iron  deficiency and anemia of chronic disease. Continue folic acid  supplementation Continue iron  supplementation.    Subjective:  Patient continues to be lethargic today.  Patient is complaining of moderate aching pain emanating from his right chest tube.  Pain is nonradiating and worse with movement.  Physical  Exam:  Vitals:   10/19/23 0502 10/19/23 0802 10/19/23 1700 10/19/23 2033  BP: 135/83  138/84 (!) 149/86  Pulse: (!) 103  96 98  Resp: 16 16  16   Temp: 98 F (36.7 C)  98.4 F (36.9 C) 98.2 F (36.8 C)  TempSrc: Oral  Oral Oral  SpO2: 98% 98% 99%   Weight:      Height:         Constitutional: Lethargic but arousable, patient is oriented x 2.  No associated distress.   Skin: Extremely dry flaky skin of the scalp with poor skin turgor.   Eyes: Pupils are equally reactive to light.  No evidence of scleral icterus or conjunctival pallor.  ENMT: Dry mucous membranes noted.  Posterior pharynx clear of any exudate or lesions.   Respiratory: Bibasilar rales, worse in the right base.  Scattered rhonchi. Normal respiratory effort. No accessory muscle use.  Chest/Back: Right-sided chest tube and bilateral nephrostomy tubes now in place. Cardiovascular: Regular rate and rhythm, no murmurs / rubs / gallops. No extremity edema. 2+ pedal pulses. No carotid bruits.  Abdomen: Abdomen is soft and nontender.  No evidence of intra-abdominal masses.  Positive bowel sounds noted in all quadrants.   Musculoskeletal: No joint deformity upper and lower extremities. Good ROM, no contractures.  Poor muscle tone noted.   Data Reviewed:  I have personally reviewed and interpreted labs, imaging.  Significant findings are   CBC: Recent Labs  Lab 10/14/23 2237 10/16/23 0511 10/17/23 0506 10/18/23 0510 10/19/23 0759  WBC 26.6* 17.1* 9.4 9.4 12.6*  NEUTROABS 24.8*  --  7.9* 8.0* 11.5*  HGB 10.3* 8.2* 7.6* 8.6* 10.0*  HCT 31.4* 25.2* 23.4* 27.1* 31.7*  MCV 94.9 96.2 96.3 95.1 95.5  PLT 412* 279 255 311 348   Basic Metabolic Panel: Recent Labs  Lab 10/16/23 0511 10/17/23 0506 10/18/23 0510 10/18/23 1213 10/19/23 0759  NA 133* 133* 135 139 139  K 4.0 3.6 3.9 4.0 4.5  CL 108 105 110 111 111  CO2 15* 17* 16* 18* 15*  GLUCOSE 118* 99 115* 109* 114*  BUN 71* 74* 70* 68* 63*  CREATININE 2.91*  3.14* 2.53* 2.59* 2.44*  CALCIUM 9.4 9.2 9.4 9.6 10.3  MG  --  2.6* 2.6*  --  2.5*   GFR: Estimated Creatinine Clearance: 17.9 mL/min (A) (by C-G formula based on SCr of 2.44 mg/dL (H)). Liver Function Tests: Recent Labs  Lab 10/14/23 2237 10/15/23 1437 10/17/23 0506 10/18/23 0510 10/19/23 0759  AST 24  --  19 15 32  ALT 17  --  19 16 16   ALKPHOS 157*  --  104 95 102  BILITOT 0.6  --  0.4 0.4 0.6  PROT 7.0 6.5 5.6* 5.7* 6.5  ALBUMIN 2.8*  --  1.9* 2.0* 2.2*    Coagulation Profile: Recent Labs  Lab 10/14/23 2237  INR 1.2   Code Status:  Full code.  Code status decision has been confirmed with: patient/ wife Family Communication: Wife and son are at bedside who has been updated on plan of care   Severity of Illness:  The appropriate patient status for this patient is INPATIENT. Inpatient status is judged to be reasonable and necessary in order to provide the required intensity of service to ensure the patient's safety. The patient's presenting symptoms, physical exam findings, and initial radiographic and laboratory data in the context of their chronic comorbidities is felt to place them at high risk for further clinical deterioration. Furthermore, it is not anticipated that the patient will be medically stable for discharge from the hospital within 2 midnights of admission.   * I certify that at the point of admission it is my clinical judgment that the patient will require inpatient hospital care spanning beyond 2 midnights from  the point of admission due to high intensity of service, high risk for further deterioration and high frequency of surveillance required.*  Time spent:  59 minutes  Author:  Zachary JINNY Ba MD  10/19/2023 11:33 PM

## 2023-10-20 ENCOUNTER — Other Ambulatory Visit: Payer: Self-pay | Admitting: Student

## 2023-10-20 DIAGNOSIS — J90 Pleural effusion, not elsewhere classified: Secondary | ICD-10-CM | POA: Diagnosis not present

## 2023-10-20 DIAGNOSIS — A419 Sepsis, unspecified organism: Secondary | ICD-10-CM | POA: Diagnosis not present

## 2023-10-20 DIAGNOSIS — I10 Essential (primary) hypertension: Secondary | ICD-10-CM | POA: Diagnosis not present

## 2023-10-20 DIAGNOSIS — N179 Acute kidney failure, unspecified: Secondary | ICD-10-CM | POA: Diagnosis not present

## 2023-10-20 DIAGNOSIS — U071 COVID-19: Secondary | ICD-10-CM | POA: Diagnosis not present

## 2023-10-20 DIAGNOSIS — R6520 Severe sepsis without septic shock: Secondary | ICD-10-CM | POA: Diagnosis not present

## 2023-10-20 DIAGNOSIS — C679 Malignant neoplasm of bladder, unspecified: Secondary | ICD-10-CM

## 2023-10-20 LAB — CBC WITH DIFFERENTIAL/PLATELET
Abs Immature Granulocytes: 0.06 10*3/uL (ref 0.00–0.07)
Basophils Absolute: 0 10*3/uL (ref 0.0–0.1)
Basophils Relative: 0 %
Eosinophils Absolute: 0.1 10*3/uL (ref 0.0–0.5)
Eosinophils Relative: 1 %
HCT: 26.4 % — ABNORMAL LOW (ref 39.0–52.0)
Hemoglobin: 8.7 g/dL — ABNORMAL LOW (ref 13.0–17.0)
Immature Granulocytes: 1 %
Lymphocytes Relative: 7 %
Lymphs Abs: 0.7 10*3/uL (ref 0.7–4.0)
MCH: 31.1 pg (ref 26.0–34.0)
MCHC: 33 g/dL (ref 30.0–36.0)
MCV: 94.3 fL (ref 80.0–100.0)
Monocytes Absolute: 0.8 10*3/uL (ref 0.1–1.0)
Monocytes Relative: 8 %
Neutro Abs: 9.3 10*3/uL — ABNORMAL HIGH (ref 1.7–7.7)
Neutrophils Relative %: 83 %
Platelets: 318 10*3/uL (ref 150–400)
RBC: 2.8 MIL/uL — ABNORMAL LOW (ref 4.22–5.81)
RDW: 15.9 % — ABNORMAL HIGH (ref 11.5–15.5)
WBC: 11 10*3/uL — ABNORMAL HIGH (ref 4.0–10.5)
nRBC: 0 % (ref 0.0–0.2)

## 2023-10-20 LAB — COMPREHENSIVE METABOLIC PANEL
ALT: 16 U/L (ref 0–44)
AST: 20 U/L (ref 15–41)
Albumin: 1.9 g/dL — ABNORMAL LOW (ref 3.5–5.0)
Alkaline Phosphatase: 105 U/L (ref 38–126)
Anion gap: 12 (ref 5–15)
BUN: 57 mg/dL — ABNORMAL HIGH (ref 8–23)
CO2: 20 mmol/L — ABNORMAL LOW (ref 22–32)
Calcium: 10.1 mg/dL (ref 8.9–10.3)
Chloride: 109 mmol/L (ref 98–111)
Creatinine, Ser: 2.29 mg/dL — ABNORMAL HIGH (ref 0.61–1.24)
GFR, Estimated: 29 mL/min — ABNORMAL LOW (ref >60)
Glucose, Bld: 109 mg/dL — ABNORMAL HIGH (ref 70–99)
Potassium: 3.5 mmol/L (ref 3.5–5.1)
Sodium: 141 mmol/L (ref 135–145)
Total Bilirubin: 0.4 mg/dL (ref 0.0–1.2)
Total Protein: 6 g/dL — ABNORMAL LOW (ref 6.5–8.1)

## 2023-10-20 LAB — CULTURE, BLOOD (ROUTINE X 2)
Culture: NO GROWTH
Special Requests: ADEQUATE

## 2023-10-20 LAB — UREA NITROGEN, URINE: Urea Nitrogen, Ur: 395 mg/dL

## 2023-10-20 LAB — MAGNESIUM: Magnesium: 2.3 mg/dL (ref 1.7–2.4)

## 2023-10-20 MED ORDER — LEVOFLOXACIN 500 MG PO TABS
500.0000 mg | ORAL_TABLET | ORAL | Status: AC
Start: 2023-10-22 — End: 2023-10-24
  Administered 2023-10-22: 500 mg via ORAL
  Filled 2023-10-20: qty 1

## 2023-10-20 MED ORDER — LEVOFLOXACIN 500 MG PO TABS
750.0000 mg | ORAL_TABLET | Freq: Once | ORAL | Status: AC
  Administered 2023-10-20: 750 mg via ORAL
  Filled 2023-10-20: qty 2

## 2023-10-20 MED ORDER — LEVOFLOXACIN 500 MG PO TABS: 500.0000 mg | ORAL_TABLET | Freq: Every day | ORAL | Status: DC

## 2023-10-20 MED ORDER — LEVOFLOXACIN 500 MG PO TABS: 750.0000 mg | ORAL_TABLET | Freq: Once | ORAL | Status: DC

## 2023-10-20 NOTE — Progress Notes (Signed)
 Wesley [name]   DOB:Mar 19, 1951   FM#:[id number]    ASSESSMENT & PLAN:   Metastatic urothelial cancer of the bladder His imaging studies suspicious for lung metastasis, bone metastasis and peritoneal metastasis to the skin He is very frail with ECOG performance status score of 4 He is not a candidate for any kind of treatment due to his frail status The patient has desired treatment but I believe he has unrealistic expectation. His wife inquired whether he is a candidate for ivermectin.  She believed that ivermectin could provide cure for cancer.  I told his wife that not only that he is not a candidate for treatment due to his frail status, ivermectin is not an approved treatment for any kind of cancer. Continue supportive care   Acute on chronic renal failure His renal function has somewhat stabilized but I believe the serum creatinine is underestimating his EGFR.  The patient is significantly cachectic.  I think his to renal function is likely worse.  He has minimal output from his tubes   Multifactorial anemia There is a component of iron  deficiency, folate deficiency and anemia chronic renal failure Recommend transfusion support as needed Recommend folic acid  supplement   Severe protein calorie malnutrition, cachexia Multifactorial, combination of his advanced disease versus poor appetite as well as recent infection Recommend frequent small meals and dietitian review I have extensive discussion with family members.  It is not likely that the patient can eat enough to maintain his own nutritional status at this point but they would like to continue trying   Severe constipation He will continue aggressive laxatives   Discharge planning  unknown   Goals of care discussion We have extensive goals of care discussion once last week and again have another discussion with his son and wife.  Overall, I believe they have been somewhat unrealistic.  They believe the they can get the  patient to eat more. His ECOG performance status score is very poor  The patient desire palliative chemotherapy but he is not a candidate right now due to his poor functional status Appreciate palliative care consult  I will return to check on him end  of next week All questions were answered. The patient knows to call the clinic with any problems, questions or concerns.   The total time spent in the appointment was 55 minutes encounter with patients including review of chart and various tests results, discussions about plan of care and coordination of care plan  Almarie Bedford, MD 10/20/2023 12:26 PM  Subjective:  The patient is intermittently alert.  He stated he is not in pain.  He could not recall conversations from Friday.  His wife and son, Feliciano by the bedside.  The patient have placement of nephrostomy tube since my visit.  Vital signs, lab results and imaging studies were reviewed  Objective:  Vitals:   10/19/23 2033 10/20/23 0509  BP: (!) 149/86 (!) 142/81  Pulse: 98 94  Resp: 16 18  Temp: 98.2 F (36.8 C) (!) 97.4 F (36.3 C)  SpO2:  99%     Intake/Output Summary (Last 24 hours) at 10/20/2023 1226 Last data filed at 10/20/2023 9147 Gross per 24 hour  Intake 1111.4 ml  Output 1100 ml  Net 11.4 ml    GENERAL: He is thin and cachectic.  Multiple appliances by the bedside.  He is on oxygen.  The patient is intermittently alert but falls asleep very quickly   Labs:  Recent Labs    10/17/23  9493 10/18/23 0510 10/18/23 1213 10/19/23 0759  NA 133* 135 139 139  K 3.6 3.9 4.0 4.5  CL 105 110 111 111  CO2 17* 16* 18* 15*  GLUCOSE 99 115* 109* 114*  BUN 74* 70* 68* 63*  CREATININE 3.14* 2.53* 2.59* 2.44*  CALCIUM 9.2 9.4 9.6 10.3  GFRNONAA 20* 26* 25* 27*  PROT 5.6* 5.7*  --  6.5  ALBUMIN 1.9* 2.0*  --  2.2*  AST 19 15  --  32  ALT 19 16  --  16  ALKPHOS 104 95  --  102  BILITOT 0.4 0.4  --  0.6    Studies:  IR NEPHROSTOMY PLACEMENT RIGHT Result Date:  10/18/2023 CLINICAL DATA:  History of bladder carcinoma with cystectomy and ileal conduit. Bilateral hydronephrosis and need for nephrostomy tube diversion. EXAM: 1. ULTRASOUND GUIDANCE FOR PUNCTURE OF THE LEFT RENAL COLLECTING SYSTEM 2. LEFT PERCUTANEOUS NEPHROSTOMY TUBE PLACEMENT 3. ULTRASOUND GUIDANCE FOR PUNCTURE OF THE RIGHT RENAL COLLECTING SYSTEM 4. RIGHT PERCUTANEOUS NEPHROSTOMY TUBE PLACEMENT COMPARISON:  Renal ultrasound on 10/17/2023 ANESTHESIA/SEDATION: Moderate (conscious) sedation was employed during this procedure. A total of Versed  1.5 mg and Fentanyl  75 mcg was administered intravenously. Moderate Sedation Time: 28 minutes. The patient's level of consciousness and vital signs were monitored continuously by radiology nursing throughout the procedure under my direct supervision. CONTRAST:  25 mL Omnipaque  300 MEDICATIONS: No additional medications. The patient is on scheduled IV Zosyn  and did not receive additional antibiotics. FLUOROSCOPY TIME:  2 minutes and 42 seconds.  1.0 mGy. PROCEDURE: The procedure, risks, benefits, and alternatives were explained to the patient. Questions regarding the procedure were encouraged and answered. The patient understands and consents to the procedure. A time-out was performed prior to the procedure. Both flank regions were prepped with chlorhexidine  in a sterile fashion, and a sterile drape was applied covering the operative field. A sterile gown and sterile gloves were used for the procedure. Local anesthesia was provided with 1% Lidocaine . Ultrasound was used to localize both kidneys. Under direct ultrasound guidance, 21 gauge needles were advanced into the renal collecting systems. Ultrasound image documentation was performed. Aspiration of urine sample was performed followed by contrast injection. Transitional dilators were advanced over guidewires. Percutaneous tract dilatation was then performed over the guidewire. Ten French bilateral percutaneous  nephrostomy tubes were then advanced and formed in the collecting systems. Catheter position was confirmed by fluoroscopy after contrast injection. Both catheters were attached to gravity bag drainage and secured at skin exit sites with Prolene retention sutures and StatLock devices. COMPLICATIONS: None. FINDINGS: After left nephrostomy tube placement, urine return was initially bloody/blood tinged. The tube was formed in the renal pelvis. 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 The nephrostomy tube was partially formed in the upper pole with extension into the region of the renal pelvis. Urine return is clear and slightly turbid. IMPRESSION: Bilateral nephrostomy tube placement. Ten French bilateral nephrostomy tubes were placed and attached to gravity bag drainage. Electronically Signed   By: Marcey Moan M.D.   On: 10/18/2023 17:34   IR NEPHROSTOMY PLACEMENT LEFT Result Date: 10/18/2023 CLINICAL DATA:  History of bladder carcinoma with cystectomy and ileal conduit. Bilateral hydronephrosis and need for nephrostomy tube diversion. EXAM: 1. ULTRASOUND GUIDANCE FOR PUNCTURE OF THE LEFT RENAL COLLECTING SYSTEM 2. LEFT PERCUTANEOUS NEPHROSTOMY TUBE PLACEMENT 3. ULTRASOUND GUIDANCE FOR PUNCTURE OF THE RIGHT RENAL COLLECTING SYSTEM 4. RIGHT PERCUTANEOUS NEPHROSTOMY TUBE PLACEMENT COMPARISON:  Renal ultrasound on 10/17/2023 ANESTHESIA/SEDATION: Moderate (conscious) sedation was employed during this procedure. A total of Versed  1.5 mg and Fentanyl  75 mcg was administered intravenously. Moderate Sedation Time: 28 minutes. The patient's level of consciousness and vital signs were monitored continuously by radiology nursing throughout the procedure under my direct supervision. CONTRAST:  25 mL Omnipaque  300 MEDICATIONS: No additional medications. The patient is on scheduled IV Zosyn  and did not receive additional antibiotics. FLUOROSCOPY TIME:  2  minutes and 42 seconds.  1.0 mGy. PROCEDURE: The procedure, risks, benefits, and alternatives were explained to the patient. Questions regarding the procedure were encouraged and answered. The patient understands and consents to the procedure. A time-out was performed prior to the procedure. Both flank regions were prepped with chlorhexidine  in a sterile fashion, and a sterile drape was applied covering the operative field. A sterile gown and sterile gloves were used for the procedure. Local anesthesia was provided with 1% Lidocaine . Ultrasound was used to localize both kidneys. Under direct ultrasound guidance, 21 gauge needles were advanced into the renal collecting systems. Ultrasound image documentation was performed. Aspiration of urine sample was performed followed by contrast injection. Transitional dilators were advanced over guidewires. Percutaneous tract dilatation was then performed over the guidewire. Ten French bilateral percutaneous nephrostomy tubes were then advanced and formed in the collecting systems. Catheter position was confirmed by fluoroscopy after contrast injection. Both catheters were attached to gravity bag drainage and secured at skin exit sites with Prolene retention sutures and StatLock devices. COMPLICATIONS: None. FINDINGS: After left nephrostomy tube placement, urine return was initially bloody/blood tinged. The tube was formed in the renal pelvis. 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 The nephrostomy tube was partially formed in the upper pole with extension into the region of the renal pelvis. Urine return is clear and slightly turbid. IMPRESSION: Bilateral nephrostomy tube placement. Ten French bilateral nephrostomy tubes were placed and attached to gravity bag drainage. Electronically Signed   By: Marcey Moan M.D.   On: 10/18/2023 17:34   DG CHEST PORT 1 VIEW Result Date: 10/18/2023 CLINICAL DATA:   [id number] with pleural effusion and chest tube in place. EXAM: PORTABLE CHEST 1 VIEW COMPARISON:  Portable chest yesterday at 5:17 a.m. FINDINGS: 4:58 a.m. pigtail right chest tube remains with the pigtail superimposing in the lateral lower chest alongside the elevated hemidiaphragm. There is no measurable pneumothorax. Small right pleural effusion has reaccumulated with adjacent hazy atelectasis or consolidation. The left lung and right upper lung fields are clear. The cardiomediastinal silhouette is stable. There is aortic atherosclerosis. No vascular congestion is seen. No new osseous findings. Thoracic spondylosis and DDD. IMPRESSION: 1. Small right pleural effusion has reaccumulated with adjacent hazy atelectasis or consolidation. 2. Right chest tube remains in place. No measurable pneumothorax. 3. Aortic atherosclerosis. Electronically Signed   By: Francis Quam M.D.   On: 10/18/2023 06:57   DG CHEST PORT 1 VIEW Result Date: 10/17/2023 CLINICAL DATA:  Status post right chest tube placement for loculated right pleural effusion. EXAM: PORTABLE CHEST 1 VIEW COMPARISON:  10/16/2023 FINDINGS: The heart size and mediastinal contours are within normal limits. Stable positioning right pleural drainage catheter. No significant residual pleural fluid. Stable mild atelectasis at the right lung base with elevation of the right hemidiaphragm. No pneumothorax or pulmonary edema. The visualized skeletal structures are unremarkable. IMPRESSION: Stable positioning of right pleural drainage catheter. No significant residual pleural fluid. Stable mild atelectasis at the right  lung base with elevation of the right hemidiaphragm. Electronically Signed   By: Marcey Moan M.D.   On: 10/17/2023 09:28   US  RENAL Result Date: 10/17/2023 CLINICAL DATA:  [id number]. Renal failure. Prior cystectomy with right lower quadrant urostomy. Surgery date 05/22/2023. EXAM: RENAL / URINARY TRACT ULTRASOUND COMPLETE COMPARISON:  CT without contrast  10/15/2023, renal ultrasound 07/24/2023 FINDINGS: Right Kidney: Renal measurements: Atrophic measuring 5.2 x 5.0 x 4.1 cm = volume: 55.9 mL, previously 69.6 mL. The cortex is thin and echogenic as before. There is moderate hydronephrosis which was seen on the CT yesterday but was not seen on the ultrasound from 07/24/2023. 2 cm anechoic cyst in the upper pole is again shown. No mass is evident and no visible stones. Left Kidney: Renal measurements: 10.0 x 7.0 x 7.4 cm = volume: 275.8 mL. The renal cortex again is mildly echogenic but no more than previously. Severe hydronephrosis is again noted and was present on both prior studies. There is hypoechoic debris within some of the dilated calices which could be infectious or hemorrhagic debris. Correlate with urinalysis. There are multiple anechoic cysts, largest is 6.7 cm, as before. No complex cyst is seen. No visible stones. Bladder: Surgically absent. Other: None. IMPRESSION: 1. Moderate right hydronephrosis which was seen on the CT yesterday but was not seen on the ultrasound from 07/24/2023. 2. Severe left hydronephrosis which was present on both prior studies. There is hypoechoic debris within some of the dilated calices which could be infectious or hemorrhagic debris. Correlate with urinalysis. 3. Atrophic right kidney with thin and echogenic cortex as before. Mild but unchanged increased echogenicity of the left kidney. 4. Bilateral renal cysts. 5. Prior cystectomy with right lower quadrant urostomy. Electronically Signed   By: Francis Quam M.D.   On: 10/17/2023 02:14   DG Chest Port 1 View Result Date: 10/16/2023 CLINICAL DATA:  Pleural effusion. EXAM: PORTABLE CHEST 1 VIEW COMPARISON:  October 15, 2023. FINDINGS: Right-sided chest tube is again noted.  No pneumothorax is noted. IMPRESSION: No definite pneumothorax seen currently. Right-sided chest tube is again noted. Electronically Signed   By: Lynwood Landy Raddle M.D.   On: 10/16/2023 09:18   DG Chest  Port 1 View Result Date: 10/15/2023 CLINICAL DATA:  Thoracostomy placement EXAM: PORTABLE CHEST 1 VIEW COMPARISON:  10/14/2023 FINDINGS: Chest tube in place on the right in the inferior pleural space. Moderate right pneumothorax, estimated at 30%. No tension. Volume loss persists at the left lung base. IMPRESSION: Chest tube in place on the right. Moderate right pneumothorax, estimated at 30%. No tension. Electronically Signed   By: Oneil Officer M.D.   On: 10/15/2023 14:51   CT CHEST ABDOMEN PELVIS WO CONTRAST Result Date: 10/15/2023 CLINICAL DATA:  Set CIS, elevated white count. EXAM: CT CHEST, ABDOMEN AND PELVIS WITHOUT CONTRAST TECHNIQUE: Multidetector CT imaging of the chest, abdomen and pelvis was performed following the standard protocol without IV contrast. RADIATION DOSE REDUCTION: This exam was performed according to the departmental dose-optimization program which includes automated exposure control, adjustment of the mA and/or kV according to patient size and/or use of iterative reconstruction technique. COMPARISON:  CT abdomen and pelvis 09/30/2023.  Chest CT 07/19/2023 FINDINGS: CT CHEST FINDINGS Cardiovascular: Heart is normal size. Aorta is normal caliber. Diffuse 3 vessel coronary artery disease. Scattered aortic atherosclerosis. Mediastinum/Nodes: No mediastinal, hilar, or axillary adenopathy. Trachea and esophagus are unremarkable. Thyroid  unremarkable. Lungs/Pleura: Loculated moderate right pleural effusion, increasing since prior chest CT. Airspace opacity in the right lower  lobe and right middle lobe could reflect atelectasis or pneumonia. Predominantly linear opacities in the lingula and left lower lobe could reflect scarring or atelectasis. Subpleural nodularity noted along the left fissure and posteriorly. Left upper lobe nodule on image 37 measures 8 mm, stable since prior study. Left upper lobe nodule on image 36 measures 4 mm, new since prior study. Musculoskeletal: Chest wall soft  tissues are unremarkable. No acute bony abnormality. CT ABDOMEN PELVIS FINDINGS Hepatobiliary: 1.9 cm low-density lesion in the right hepatic lobe. 1.8 cm previously, difficult to characterize on this noncontrast study. Gallbladder unremarkable. No biliary ductal dilatation. Pancreas: No focal abnormality or ductal dilatation. Spleen: No focal abnormality.  Normal size. Adrenals/Urinary Tract: Adrenal glands unremarkable. Persistent bilateral hydronephrosis, unchanged since prior study. Nonobstructing stones in the lower pole of the right kidney. Right kidney is atrophic. Multiple bilateral renal cysts are unchanged. Prior cystectomy with right lower quadrant urostomy. Ureters are difficult to follow without contrast. Stomach/Bowel: Moderate to large stool burden throughout the colon, similar to prior study. No bowel obstruction. Vascular/Lymphatic: Aortoiliac atherosclerosis. No evidence of aneurysm or adenopathy. 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 Other: No free fluid or free air. Musculoskeletal: Again noted is the lytic area within the left sacral ala and adjacent left iliac bone, unchanged since recent study. Degenerative disc and facet disease in the lumbar spine. IMPRESSION: Enlarging chronic loculated right pleural effusion and increasing right lower lobe airspace disease. This could reflect atelectasis or pneumonia. Nodularity noted along the left fissure and subpleural locations, increasing since prior study. Cannot exclude metastases. New 4 mm left upper lobe pulmonary nodule. Recommend attention on follow-up imaging. Diffuse coronary artery disease.  Aortic atherosclerosis. 1.9 cm low-density lesion in the right hepatic lobe, difficult to characterize on this noncontrast study. Prior cystectomy with right lower quadrant urostomy. Stable chronic bilateral hydronephrosis. Moderate to large stool burden throughout the colon. Stable lytic areas within the left sacrum and adjacent  left iliac bone. Electronically Signed   By: Franky Crease M.D.   On: 10/15/2023 01:40   DG Chest Port 1 View Result Date: 10/14/2023 CLINICAL DATA:  Weakness EXAM: PORTABLE CHEST 1 VIEW COMPARISON:  09/30/2023 FINDINGS: Cardiac shadow is within normal limits. Increased density is noted over the right hemithorax which is new from the prior exam. The appearance suggests a loculated effusion. CT of the chest may be helpful for further evaluation. Left lung is clear. No acute bony abnormality is noted IMPRESSION: Increased density over the right hemithorax suggesting a loculated effusion. CT of the chest may be helpful for further evaluation. Electronically Signed   By: Oneil Devonshire M.D.   On: 10/14/2023 22:52   CT ABDOMEN PELVIS WO CONTRAST Result Date: 09/30/2023 CLINICAL DATA:  History of bladder cancer status post cystectomy with fever. * Tracking Code: BO * EXAM: CT ABDOMEN AND PELVIS WITHOUT CONTRAST TECHNIQUE: Multidetector CT imaging of the abdomen and pelvis was performed following the standard protocol without IV contrast. RADIATION DOSE REDUCTION: This exam was performed according to the departmental dose-optimization program which includes automated exposure control, adjustment of the mA and/or kV according to patient size and/or use of iterative reconstruction technique. COMPARISON:  CT abdomen and pelvis dated 09/17/2023 and multiple priors FINDINGS: Lower chest: Chronic right pleural effusion with adjacent medial right lower lobe rounded density, likely round atelectasis. Additional subsegmental atelectasis in the middle lobe and lingula. Irregular basilar right lower lobe nodule measuring 1.3 x 0.7 cm (5:23), increased in size from 5 x  4 mm on 07/22/2023. Unchanged right lower lobe perifissural nodules. Partially imaged heart size is normal. Coronary artery calcifications. Hepatobiliary: Ill-defined 1.8 cm hypodensity in segment 4/8 (2:13), incompletely characterized, but appears new compared to  more remote studies. No intra or extrahepatic biliary ductal dilation. Gallbladder is contracted. Pancreas: No focal lesions or main ductal dilation. Spleen: Normal in size without focal abnormality. Adrenals/Urinary Tract: No adrenal nodules. Persistent bilateral hydroureteronephrosis. Asymmetric right renal atrophy. Multifocal bilateral cysts are again seen including peripherally calcified right lower pole cysts. No renal calculi. The ureters are not well seen in the absence of contrast. Prior cystectomy. Right lower quadrant urostomy. Stomach/Bowel: Normal appearance of the stomach. No evidence of bowel wall thickening, distention, or inflammatory changes. Moderate volume stool throughout the colon. Appendix is not discretely seen. Vascular/Lymphatic: Aortic atherosclerosis. No enlarged abdominal or pelvic lymph nodes. Reproductive: Prostate brachytherapy seeds. Other: No free fluid, fluid collection, or free air. Musculoskeletal: Similar to slightly increased lytic changes of the left sacral ala. New lytic changes involving the left iliac bone adjacent to the sacroiliac joint. Degenerative changes of the hips, left-greater-than-right. Multilevel degenerative changes of the partially imaged thoracic and lumbar spine. IMPRESSION: 1. Ill-defined 1.8 cm hypodensity in segment 4/8 of the liver, incompletely characterized, but appears new compared to more remote studies. Findings are suspicious for metastatic disease. Recommend further evaluation with contrast-enhanced MRI of the abdomen. Alternatively, attention on follow-up. 2. Similar to slightly increased lytic changes of the left sacral ala. New lytic changes involving the left iliac bone adjacent to the sacroiliac joint. 3. Irregular basilar right lower lobe nodule measuring 1.3 x 0.7 cm, increased in size from 5 x 4 mm on 07/22/2023, suspicious for metastatic disease. 4. Postsurgical change from cystectomy with right lower quadrant ostomy. Persistent bilateral  hydroureteronephrosis. Ureters are not well evaluated in the absence of contrast. 5. Aortic Atherosclerosis (ICD10-I70.0). Coronary artery calcifications. Assessment for potential risk factor modification, dietary therapy or pharmacologic therapy may be warranted, if clinically indicated. Electronically Signed   By: Limin  Xu M.D.   On: 09/30/2023 20:28   DG Chest Port 1 View Result Date: 09/30/2023 CLINICAL DATA:  Fever.  Bladder cancer removed in August. EXAM: PORTABLE CHEST 1 VIEW COMPARISON:  09/02/2023 FINDINGS: The heart size and mediastinal contours are within normal limits. Both lungs are clear. The visualized skeletal structures are unremarkable. IMPRESSION: No active disease. Electronically Signed   By: Elsie Gravely M.D.   On: 09/30/2023 20:11

## 2023-10-20 NOTE — Assessment & Plan Note (Signed)
 Continue Keppra

## 2023-10-20 NOTE — Progress Notes (Signed)
 PMT brief progress note.   Patient noted to be in discussions with oncology colleague Dr Lonn, wife and son at bedside.  Chart reviewed, oncology note reviewed.  Medication history noted.  3 doses of 5 mg PO Oxycodone  IR in the past 24 hours.  Continue current pain and non-pain symptom management regimen No new in patient PMT specific recommendations at this time Low MDM Lonia Serve MD [facility] [place] palliative.

## 2023-10-20 NOTE — Assessment & Plan Note (Signed)
 Imaging studies concerning for lung metastases, bone metastases and peritoneal metastases with evidence of likely right-sided malignant effusion Performance status is worsening with poor prognosis. Patient and family still voice desire for consideration for all treatment options and therefore Dr. Lonn with oncology graciously agreed to come speak to the patient about his overall prognosis on 1/10.  She has been following along since, her recommendation has been supportive care and that the patient may not tolerate systemic chemotherapy at this point.   She has additionally advised that if patient does eventually resume some form of treatment that his Urologist would be leading that effort.

## 2023-10-20 NOTE — Plan of Care (Signed)
   Problem: Education: Goal: Knowledge of risk factors and measures for prevention of condition will improve Outcome: Progressing   Problem: Coping: Goal: Psychosocial and spiritual needs will be supported Outcome: Progressing   Problem: Respiratory: Goal: Will maintain a patent airway Outcome: Progressing

## 2023-10-20 NOTE — Assessment & Plan Note (Signed)
 Creatinine continuing to slowly downtrend, currently 2.29 from a peak of 3.14. Baseline creatinine 1.6-1.8. Bilateral hydronephrosis identified during this hospitalization prompting bilateral nephrostomy tube placement by IR 1/10 after this was recommended by urology. Patient was transitioned off of intravenous fluids. Strict input and output monitoring Monitoring renal function and electrolytes with serial chemistries

## 2023-10-20 NOTE — Assessment & Plan Note (Signed)
 Patient continues to exhibit mild leukocytosis. Sepsis thought to be secondary to pneumonia with possible additional contribution of urinary tract infection with urine growing out Klebsiella and Enterococcus Gallinarum on 1/6. Presentation complicated by concurrent COVID-19 infection Transitioning from intravenous Zosyn  to oral levofloxacin  to complete a 7-day course of antibiotics. Pleural fluid culture negative.  Cytology appears to have squamous cells concerning for metastatic malignancy per discussion between pathology and Dr. Kara 1/10.

## 2023-10-20 NOTE — Assessment & Plan Note (Signed)
 No clinical evidence of bleeding  Anemia workup revealing folate deficiency as well as an iron panel suggestive of iron deficiency and anemia of chronic disease. Continue folic acid supplementation Continue iron supplementation.

## 2023-10-20 NOTE — Assessment & Plan Note (Signed)
 COVID positive on 1/6  unclear as to how much COVID-19 is contributing to patient's presentation Airborne and contact isolation through 1/16 In the absence of significant hypoxia we will abstain from initiating remdesivir or barcitinib

## 2023-10-20 NOTE — Assessment & Plan Note (Signed)
 Urine culture 1/6 growing out Klebsiella and Enterococcus Gallinarum, a VRE.  Patient was initially treated with intravenous Zosyn which is now being transitioned to oral levofloxacin today to complete a 7-day course of therapy.

## 2023-10-20 NOTE — Progress Notes (Signed)
 NAME:  Wesley [name], MRN:  [id number], DOB:  03-Mar-1951, LOS: 5 ADMISSION DATE:  10/14/2023, CONSULTATION DATE:  1/7 REFERRING MD:  Zella, CHIEF COMPLAINT:  recurrent loculated pleural effusion    History of Present Illness:  This is a 73 year old male w/ sig h/o squamous cell carcinoma of scalp, recurrent hydronephrosis now s/p cystoprostatectomy w/ placement of ileal conduit (aug 2024) for bladder cancer. Has been hospitalized several times for recurrent urinary tract sepsis. Also followed by Dr Neda w/ our pulm group initially for loculated right pleural effusion first noted in Oct s/p urinary procedure felt symptomatic and resolved. Also noted to have Rounded density in RLL as well as RLL nodules which have been followed in the out pt setting. His most recent f/U CT of abd/pelvis obtained during admission (dec 23) for one of his recurrent sepsis admits showed small right effusion and on-going right lower lobe rounded density as well as increase in size of RLL nodule.  He was discharged 12/26 completed abx Presented back to ER 1/6 w/ cc: fever generalized weakness  x 1d In ER wbc ct 26.6, tachycardic, scr up to 2.6. On further questioning reports some increased shortness of breath since dc last.   Cultures sent. ABX started. CXR obtained showed increased density over right hemithorax raising concern for loculated effusion  A CT chest/abd/pelvis obtained showed RLL airspace disease w/ air bronchograms. LLL opacity. Mod sized loculated right effusion. The previoulsy noted rounded right LL density and new nodularity along the left subpleural location. Also showed. 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 S/p robotic cystoprostatectomy w/ placement of ileal conduit (aug 2024) Prior loculated right pleural effusion.  Uropsepsis  Seizure disorder: Oxtellar XR  600 mg daily and Keppra  500 mg three times a day  Essential tremors  UC Prior smoker Vertigo w/ falls at home  CKD stage 4  Significant Hospital Events: Including procedures, antibiotic start and stop dates in addition to other pertinent events   1/6 admitted w/ sepsis felt UT source vs HCAP w/ complicated right pleural effusion. COVID positive. Blood cultures pending. Zosyn  started  1/7 CT obtained. Marked increase in right loculated effusion. RLL PNA vs atx stable RLL density and new nodular left pleural based changes. PCCM asked to eval >right small bore chest tube placed  1/8 No issues overnight, 650 total output in [place]  1/9 output in past 24 hours, TPA/DNAse given - 1st dose  Interim History / Subjective:  Chest tube output 50 cc over the last 24 hours   Objective   Blood pressure (!) 142/81, pulse 94, temperature (!) 97.4 F (36.3 C), temperature source Oral, resp. rate 18, height 5' 4 (1.626 m), weight 47.7 kg, SpO2 99%.        Intake/Output Summary (Last 24 hours) at 10/20/2023 0931 Last data filed at 10/20/2023 9147 Gross per 24 hour  Intake 1111.4 ml  Output 1100 ml  Net 11.4 ml   Filed Weights   10/14/23 2233 10/15/23 2200  Weight: 49.7 kg 47.7 kg   Examination: General: Frail chronically ill-appearing man laying in bed in no distress HEENT: Oropharynx dry, clear, no secretions, strong  voice Neuro: Sleepy, somewhat lethargic.  He wakes and answers questions but then quickly back to sleep.  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 -Follow for any reaccumulation of pleural fluid, if so then could consider Pleurx drain placement -Follow chest x-ray 1/13 -Pulmonary hygiene -Pain control   Best Practice (right click and Reselect all SmartList Selections daily)  Per primary   Critical care time: NA    Lamar Chris, MD, PhD 10/20/2023, 9:31 AM [facility] [phone] or if no answer before 7:00PM call [phone] For any issues after 7:00PM please call eLink [phone]

## 2023-10-20 NOTE — Assessment & Plan Note (Signed)
 Present on admission  frequent turns Application of Mepilex to area daily

## 2023-10-20 NOTE — Assessment & Plan Note (Signed)
 Appears to be malignant in etiology.   Status post chest tube placement 1/7, and subsequently removed today after chest tube output decreased. Patient additionally underwent pleural fibrinolysis by PCCM 1/9 to break up loculated effusion. Pleural fluid culture negative.  Cytology appears to have squamous cells concerning for metastatic malignancy per discussion between pathology and Dr. Kara.

## 2023-10-20 NOTE — Assessment & Plan Note (Signed)
 Continue amlodipine As needed intravenous hypertensives for markedly elevated blood pressure

## 2023-10-20 NOTE — Assessment & Plan Note (Signed)
 Patient is suffering from numerous complications of likely metastatic urothelial malignancy Prognosis has potentially worsened throughout this prolonged hospitalization with numerous complications including a malignant right-sided pleural effusion renal injury and bilateral hydronephrosis in the setting of protein calorie malnutrition and continued weight loss Lengthy discussions with the patient and wife surrounding the patient's guarded prognosis nearly daily.  Patient and his wife still wish to pursue all avenues of therapy at this time.  Patient is full code. Palliative care following, their input is appreciated.

## 2023-10-20 NOTE — Progress Notes (Signed)
 Chest tube removed per order. Occlusive dressing placed. Patient in no distress at time of removal, even respirations. Discussed with bedside RN

## 2023-10-20 NOTE — Progress Notes (Signed)
 PROGRESS NOTE   Wesley [name]  FMW:[id number] DOB: 1951-06-27 DOA: 10/14/2023 PCP: [name], MD   Date of Service: the patient was seen and examined on 10/20/2023  Brief Narrative:  73 y.o. 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  Of note, patient was recently hospitalized at [facility] discharged home on 10/03/2023 after a stay for sepsis secondary to UTI and was discharged on oral antibiotics.  Upon evaluation in the emergency department patient was found to be tachycardic and febrile with a white blood cell count of 26.6 as well as acute kidney injury with creatinine of 2.6.  Patient was felt to be suffering from severe sepsis with acute kidney injury.  Patient was initiated on intravenous fluids, as well as intravenous antibiotics antibiotics with vancomycin  and Zosyn .  The hospitalist group was then called to assess the patient for admission to the hospital.  Patient was found to be COVID-positive and placed on contact and airborne isolation.  PCCM was consulted concerning the right-sided pleural effusion.  After the patient was identified to have a loculated effusion Dr. Kassie with PCCM was consulted and recommended chest tube placement which was performed on 1/7 successfully and set to -20 cm of H2O.  Furthermore, patient underwent fibrinolysis of the effusion on 1/9.  Pleural fluid cultures remained without growth.  Pleural fluid cytology however did reveal atypical squamous cells.  While appropriate staining could not be performed this was suspicious for malignant  urothelial metastatic spread per their discussion between Dr. Kara and pathology on 1/10.  Dr. Lonn with medical oncology was consulted who felt that with patient's poor performance status recommended supportive care for now with the patient to follow-up with both urology and oncology as an outpatient for consideration of chemotherapy when the patient physically improves.  Hospital course had been complicated by progressively worsening renal injury.  Renal ultrasound has revealed severe left-sided hydronephrosis and what appeared to be a new substantial right-sided hydronephrosis as well.  Case discussed with Dr. Carolynn with urology who graciously agreed to consult and they recommended the patient undergo bilateral nephrostomy tube placement with IR.  Patient therefore underwent bilateral nephrostomy tube placement on 1/10.  Patient also found to have Klebsiella and Enterococcus gallinarum growing from urine culture on 1/6.    Patient's chest tube output waned on 1/2 and the chest tube was discontinued.  Antibiotic regimen was eventually transitioned from intravenous Zosyn  to levofloxacin  for coverage of suspected pneumonia as well as Klebsiella and Enterococcus gallinarum growing from the urine.  With the multiple complications noted above and patient's continued physical decline goals of care were discussed.  A palliative care consultation was obtained.  Patient and family wish to keep the patient full code and continue to pursue all avenues of care at this time.     Assessment & Plan Severe sepsis [facility]) Patient continues to exhibit mild leukocytosis. Sepsis thought to be secondary to pneumonia with possible additional contribution of urinary tract infection with urine growing out Klebsiella and Enterococcus Gallinarum on 1/6. Presentation complicated by concurrent COVID-19 infection Transitioning from intravenous Zosyn  to oral levofloxacin  to complete a 7-day course of  antibiotics. Pleural  fluid culture negative.  Cytology appears to have squamous cells concerning for metastatic malignancy per discussion between pathology and Dr. Kara 1/10. Acute renal failure superimposed on stage 3b chronic kidney disease (HCC) Creatinine continuing to slowly downtrend, currently 2.29 from a peak of 3.14. Baseline creatinine 1.6-1.8. Bilateral hydronephrosis identified during this hospitalization prompting bilateral nephrostomy tube placement by IR 1/10 after this was recommended by urology. Patient was transitioned off of intravenous fluids. Strict input and output monitoring Monitoring renal function and electrolytes with serial chemistries VRE (vancomycin -resistant Enterococci) infection Urine culture 1/6 growing out Klebsiella and Enterococcus Gallinarum, a VRE.  Patient was initially treated with intravenous Zosyn  which is now being transitioned to oral levofloxacin  today to complete a 7-day course of therapy. COVID-19 virus infection COVID positive on 1/6  unclear as to how much COVID-19 is contributing to patient's presentation Airborne and contact isolation through 1/16 In the absence of significant hypoxia we will abstain from initiating remdesivir or barcitinib Pleural effusion on right Appears to be malignant in etiology.   Status post chest tube placement 1/7, and subsequently removed today after chest tube output decreased. Patient additionally underwent pleural fibrinolysis by PCCM 1/9 to break up loculated effusion. Pleural fluid culture negative.  Cytology appears to have squamous cells concerning for metastatic malignancy per discussion between pathology and Dr. Kara.  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 Patient and family still  voice desire for consideration for all treatment options and therefore Dr. Lonn with oncology graciously agreed to come speak to the patient about his overall prognosis on 1/10.  She has been following along since, her recommendation has been supportive care and that the patient may not tolerate systemic chemotherapy at this point.   She has additionally advised that if patient does eventually resume some form of treatment that his Urologist would be leading that effort. Goals of care, counseling/discussion Patient is suffering from numerous complications of likely metastatic urothelial malignancy Prognosis has potentially worsened throughout this prolonged hospitalization with numerous complications including a malignant right-sided pleural effusion renal injury and bilateral hydronephrosis in the setting of protein calorie malnutrition and continued weight loss Lengthy discussions with the patient and wife surrounding the patient's guarded prognosis nearly daily.  Patient and his wife still wish to pursue all avenues of therapy at this time.  Patient is full code. Palliative care following, their input is appreciated. Essential hypertension Continue amlodipine  As needed intravenous hypertensives for markedly elevated blood pressure Seizure disorder (HCC) Continue Keppra  Pressure injury of contiguous region involving left buttock and hip, stage 1 Present on admission  frequent turns Application of Mepilex to area daily Normocytic anemia No clinical evidence of bleeding  Anemia workup revealing folate deficiency as well as an iron  panel suggestive of iron  deficiency and anemia of chronic disease. Continue folic acid  supplementation Continue iron  supplementation.    Subjective:  Patient more awake today with slightly more appetite.  Chest wall discomfort improved status post chest tube removal.  Patient denies any shortness of breath with occasional cough.  Physical Exam:  Vitals:    10/19/23 2033 10/20/23 0509 10/20/23 1352 10/20/23 2144  BP: (!) 149/86 (!) 142/81 126/71 134/80  Pulse: 98 94 (!) 105 (!) 102  Resp: 16 18 17 20   Temp: 98.2 F (36.8 C) (!) 97.4 F (36.3 C) 98.2 F (36.8 C) 98.5 F (  36.9 C)  TempSrc: Oral Oral Oral Oral  SpO2:  99% 100% 100%  Weight:      Height:         Constitutional: Lethargic but arousable, patient is oriented x 3.  No associated distress.   Skin: Extremely dry flaky skin of the scalp with poor skin turgor.   Eyes: Pupils are equally reactive to light.  No evidence of scleral icterus or conjunctival pallor.  ENMT: Dry mucous membranes noted.  Posterior pharynx clear of any exudate or lesions.   Respiratory: Bibasilar rales, worse in the right base.  Scattered rhonchi. Normal respiratory effort. No accessory muscle use.  Chest/Back: bilateral nephrostomy tubes  in place. Cardiovascular: Regular rate and rhythm, no murmurs / rubs / gallops. No extremity edema. 2+ pedal pulses. No carotid bruits.  Abdomen: Abdomen is soft and nontender.  No evidence of intra-abdominal masses.  Positive bowel sounds noted in all quadrants.   Musculoskeletal: No joint deformity upper and lower extremities. Good ROM, no contractures.  Poor muscle tone noted.   Data Reviewed:  I have personally reviewed and interpreted labs, imaging.  Significant findings are   CBC: Recent Labs  Lab 10/14/23 2237 10/16/23 0511 10/17/23 0506 10/18/23 0510 10/19/23 0759 10/20/23 0537  WBC 26.6* 17.1* 9.4 9.4 12.6* 11.0*  NEUTROABS 24.8*  --  7.9* 8.0* 11.5* 9.3*  HGB 10.3* 8.2* 7.6* 8.6* 10.0* 8.7*  HCT 31.4* 25.2* 23.4* 27.1* 31.7* 26.4*  MCV 94.9 96.2 96.3 95.1 95.5 94.3  PLT 412* 279 255 311 348 318   Basic Metabolic Panel: Recent Labs  Lab 10/17/23 0506 10/18/23 0510 10/18/23 1213 10/19/23 0759 10/20/23 0537  NA 133* 135 139 139 141  K 3.6 3.9 4.0 4.5 3.5  CL 105 110 111 111 109  CO2 17* 16* 18* 15* 20*  GLUCOSE 99 115* 109* 114* 109*   BUN 74* 70* 68* 63* 57*  CREATININE 3.14* 2.53* 2.59* 2.44* 2.29*  CALCIUM 9.2 9.4 9.6 10.3 10.1  MG 2.6* 2.6*  --  2.5* 2.3   GFR: Estimated Creatinine Clearance: 19.1 mL/min (A) (by C-G formula based on SCr of 2.29 mg/dL (H)). Liver Function Tests: Recent Labs  Lab 10/14/23 2237 10/15/23 1437 10/17/23 0506 10/18/23 0510 10/19/23 0759 10/20/23 0537  AST 24  --  19 15 32 20  ALT 17  --  19 16 16 16   ALKPHOS 157*  --  104 95 102 105  BILITOT 0.6  --  0.4 0.4 0.6 0.4  PROT 7.0 6.5 5.6* 5.7* 6.5 6.0*  ALBUMIN 2.8*  --  1.9* 2.0* 2.2* 1.9*    Coagulation Profile: Recent Labs  Lab 10/14/23 2237  INR 1.2   Code Status:  Full code.  Code status decision has been confirmed with: patient/ wife Family Communication: Wife and son are at bedside who has been updated on plan of care   Severity of Illness:  The appropriate patient status for this patient is INPATIENT. Inpatient status is judged to be reasonable and necessary in order to provide the required intensity of service to ensure the patient's safety. The patient's presenting symptoms, physical exam findings, and initial radiographic and laboratory data in the context of their chronic comorbidities is felt to place them at high risk for further clinical deterioration. Furthermore, it is not anticipated that the patient will be medically stable for discharge from the hospital within 2 midnights of admission.   * I certify that at the point of admission it is my clinical judgment  that the patient will require inpatient hospital care spanning beyond 2 midnights from the point of admission due to high intensity of service, high risk for further deterioration and high frequency of surveillance required.*  Time spent:  50 minutes  Author:  Zachary JINNY Ba MD  10/20/2023 10:22 PM

## 2023-10-20 NOTE — Progress Notes (Signed)
 Referring Physician(s): Delia Smalls NP  Supervising Physician: Philip Cornet  Patient Status:  [facility] - In-pt  Chief Complaint:  Bilateral hydronephrosis  S/p bilateral PCN placement by Dr. Luverne on 10/18/23.   Subjective:  Patient sitting in bed, NAD, family members st bedside.  States that he is doing ok.  No complaints or concerns regarding the PCNs.   Allergies: Sulfa antibiotics  Medications: Prior to Admission medications   Medication Sig Start Date End Date Taking? Authorizing Provider  amLODipine  (NORVASC ) 5 MG tablet Take 5 mg by mouth at bedtime.   Yes [provider]  BREO ELLIPTA  100-25 MCG/ACT AEPB INHALE 1 PUFF BY MOUTH EVERY DAY Patient taking differently: Inhale 1 puff into the lungs daily. 06/25/23  Yes Olalere, Adewale A, MD  celecoxib (CELEBREX) 100 MG capsule Take 100 mg by mouth every evening.   Yes [provider]  doxepin  (SINEQUAN ) 25 MG capsule Take 25 mg by mouth at bedtime.   Yes [provider]  ferrous sulfate  325 (65 FE) MG tablet Take 325 mg by mouth every other day.   Yes [provider]  finasteride  (PROSCAR ) 5 MG tablet Take 5 mg by mouth daily at 12 noon. 02/05/13  Yes [provider]  ketoconazole (NIZORAL) 2 % cream Apply 1 Application topically daily as needed for irritation.   Yes [provider]  ketoconazole (NIZORAL) 2 % shampoo Apply 1 Application topically daily as needed (yeast).   Yes [provider]  levETIRAcetam  (KEPPRA ) 500 MG tablet Take 500 mg by mouth 3 (three) times daily.   Yes [provider]  linaclotide  (LINZESS ) 145 MCG CAPS capsule Take 145 mcg by mouth daily. 09/26/23 03/24/24 Yes [provider]  mirtazapine  (REMERON ) 15 MG tablet Take 1 tablet (15 mg total) by mouth at bedtime. 09/23/23  Yes Olalere, Adewale A, MD  OXcarbazepine  ER (OXTELLAR XR ) 600 MG TB24 Take 600 mg by mouth daily in the afternoon.   Yes [provider]   polyethylene glycol (MIRALAX  / GLYCOLAX ) 17 g packet Take 17 g by mouth 2 (two) times daily. Patient taking differently: Take 17 g by mouth daily as needed for moderate constipation. 07/25/23  Yes Cheryle Page, MD  primidone  (MYSOLINE ) 50 MG tablet Take 100 mg by mouth at bedtime.   Yes [provider]  vitamin B-12 (CYANOCOBALAMIN) 500 MCG tablet Take 500 mcg by mouth daily.   Yes [provider]  Meclizine  HCl 25 MG CHEW Chew 1 tablet by mouth every 6 (six) hours as needed (dizziness). Patient not taking: Reported on 10/15/2023    [provider]  ustekinumab (STELARA) 90 MG/ML SOSY injection Inject 90 mg into the skin every 8 (eight) weeks.    [provider]     Vital Signs: BP (!) 142/81 (BP Location: Left Arm)   Pulse 94   Temp (!) 97.4 F (36.3 C) (Oral)   Resp 18   Ht 5' 4 (1.626 m)   Wt 105 lb 2.6 oz (47.7 kg)   SpO2 99%   BMI 18.05 kg/m   Physical Exam Vitals reviewed.  Constitutional:      General: He is not in acute distress.    Appearance: He is not ill-appearing.  HENT:     Head: Normocephalic and atraumatic.  Pulmonary:     Effort: Pulmonary effort is normal.  Genitourinary:    Comments: Positive bilateral PCN  to gravity bag.   LEFT: ~100 ml of  clear colored fluid noted  in the bag.  RIGHT: trach of blood tinged urine in the bag.   Blood tinged urine in a Foley bag.  Skin:    General: Skin is warm and dry.     Coloration: Skin is not cyanotic or pale.  Neurological:     Mental Status: He is alert.  Psychiatric:        Mood and Affect: Mood normal.        Behavior: Behavior normal.     Imaging: IR NEPHROSTOMY PLACEMENT RIGHT Result Date: 10/18/2023 CLINICAL DATA:  History of bladder carcinoma with cystectomy and ileal conduit. Bilateral hydronephrosis and need for nephrostomy tube diversion. EXAM: 1. ULTRASOUND GUIDANCE FOR PUNCTURE OF THE LEFT RENAL COLLECTING SYSTEM 2. LEFT PERCUTANEOUS NEPHROSTOMY TUBE  PLACEMENT 3. ULTRASOUND GUIDANCE FOR PUNCTURE OF THE RIGHT RENAL COLLECTING SYSTEM 4. RIGHT PERCUTANEOUS NEPHROSTOMY TUBE PLACEMENT COMPARISON:  Renal ultrasound on 10/17/2023 ANESTHESIA/SEDATION: Moderate (conscious) sedation was employed during this procedure. A total of Versed  1.5 mg and Fentanyl  75 mcg was administered intravenously. Moderate Sedation Time: 28 minutes. The patient's level of consciousness and vital signs were monitored continuously by radiology nursing throughout the procedure under my direct supervision. CONTRAST:  25 mL Omnipaque  300 MEDICATIONS: No additional medications. The patient is on scheduled IV Zosyn  and did not receive additional antibiotics. FLUOROSCOPY TIME:  2 minutes and 42 seconds.  1.0 mGy. PROCEDURE: The procedure, risks, benefits, and alternatives were explained to the patient. Questions regarding the procedure were encouraged and answered. The patient understands and consents to the procedure. A time-out was performed prior to the procedure. Both flank regions were prepped with chlorhexidine  in a sterile fashion, and a sterile drape was applied covering the operative field. A sterile gown and sterile gloves were used for the procedure. Local anesthesia was provided with 1% Lidocaine . Ultrasound was used to localize both kidneys. Under direct ultrasound guidance, 21 gauge needles were advanced into the renal collecting systems. Ultrasound image documentation was performed. Aspiration of urine sample was performed followed by contrast injection. Transitional dilators were advanced over guidewires. Percutaneous tract dilatation was then performed over the guidewire. Ten French bilateral percutaneous nephrostomy tubes were then advanced and formed in the collecting systems. Catheter position was confirmed by fluoroscopy after contrast injection. Both catheters were attached to gravity bag drainage and secured at skin exit sites with Prolene retention sutures and StatLock  devices. COMPLICATIONS: None. FINDINGS: After left nephrostomy tube placement, urine return was initially bloody/blood tinged. The tube was formed in the renal pelvis. 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 The nephrostomy tube was partially formed in the upper pole with extension into the region of the renal pelvis. Urine return is clear and slightly turbid. IMPRESSION: Bilateral nephrostomy tube placement. Ten French bilateral nephrostomy tubes were placed and attached to gravity bag drainage. Electronically Signed   By: Marcey Moan M.D.   On: 10/18/2023 17:34   IR NEPHROSTOMY PLACEMENT LEFT Result Date: 10/18/2023 CLINICAL DATA:  History of bladder carcinoma with cystectomy and ileal conduit. Bilateral hydronephrosis and need for nephrostomy tube diversion. EXAM: 1. ULTRASOUND GUIDANCE FOR PUNCTURE OF THE LEFT RENAL COLLECTING SYSTEM 2. LEFT PERCUTANEOUS NEPHROSTOMY TUBE PLACEMENT 3. ULTRASOUND GUIDANCE FOR PUNCTURE OF THE RIGHT RENAL COLLECTING SYSTEM 4. RIGHT PERCUTANEOUS NEPHROSTOMY TUBE PLACEMENT COMPARISON:  Renal ultrasound on 10/17/2023 ANESTHESIA/SEDATION: Moderate (conscious) sedation was employed during this procedure. A total of Versed  1.5 mg and Fentanyl  75 mcg was administered intravenously. Moderate Sedation Time: 28  minutes. The patient's level of consciousness and vital signs were monitored continuously by radiology nursing throughout the procedure under my direct supervision. CONTRAST:  25 mL Omnipaque  300 MEDICATIONS: No additional medications. The patient is on scheduled IV Zosyn  and did not receive additional antibiotics. FLUOROSCOPY TIME:  2 minutes and 42 seconds.  1.0 mGy. PROCEDURE: The procedure, risks, benefits, and alternatives were explained to the patient. Questions regarding the procedure were encouraged and answered. The patient understands and consents to the procedure. A time-out was performed prior to  the procedure. Both flank regions were prepped with chlorhexidine  in a sterile fashion, and a sterile drape was applied covering the operative field. A sterile gown and sterile gloves were used for the procedure. Local anesthesia was provided with 1% Lidocaine . Ultrasound was used to localize both kidneys. Under direct ultrasound guidance, 21 gauge needles were advanced into the renal collecting systems. Ultrasound image documentation was performed. Aspiration of urine sample was performed followed by contrast injection. Transitional dilators were advanced over guidewires. Percutaneous tract dilatation was then performed over the guidewire. Ten French bilateral percutaneous nephrostomy tubes were then advanced and formed in the collecting systems. Catheter position was confirmed by fluoroscopy after contrast injection. Both catheters were attached to gravity bag drainage and secured at skin exit sites with Prolene retention sutures and StatLock devices. COMPLICATIONS: None. FINDINGS: After left nephrostomy tube placement, urine return was initially bloody/blood tinged. The tube was formed in the renal pelvis. 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 The nephrostomy tube was partially formed in the upper pole with extension into the region of the renal pelvis. Urine return is clear and slightly turbid. IMPRESSION: Bilateral nephrostomy tube placement. Ten French bilateral nephrostomy tubes were placed and attached to gravity bag drainage. Electronically Signed   By: Marcey Moan M.D.   On: 10/18/2023 17:34   DG CHEST PORT 1 VIEW Result Date: 10/18/2023 CLINICAL DATA:  [id number] with pleural effusion and chest tube in place. EXAM: PORTABLE CHEST 1 VIEW COMPARISON:  Portable chest yesterday at 5:17 a.m. FINDINGS: 4:58 a.m. pigtail right chest tube remains with the pigtail superimposing in the lateral lower chest alongside the elevated hemidiaphragm.  There is no measurable pneumothorax. Small right pleural effusion has reaccumulated with adjacent hazy atelectasis or consolidation. The left lung and right upper lung fields are clear. The cardiomediastinal silhouette is stable. There is aortic atherosclerosis. No vascular congestion is seen. No new osseous findings. Thoracic spondylosis and DDD. IMPRESSION: 1. Small right pleural effusion has reaccumulated with adjacent hazy atelectasis or consolidation. 2. Right chest tube remains in place. No measurable pneumothorax. 3. Aortic atherosclerosis. Electronically Signed   By: Francis Quam M.D.   On: 10/18/2023 06:57   DG CHEST PORT 1 VIEW Result Date: 10/17/2023 CLINICAL DATA:  Status post right chest tube placement for loculated right pleural effusion. EXAM: PORTABLE CHEST 1 VIEW COMPARISON:  10/16/2023 FINDINGS: The heart size and mediastinal contours are within normal limits. Stable positioning right pleural drainage catheter. No significant residual pleural fluid. Stable mild atelectasis at the right lung base with elevation of the right hemidiaphragm. No pneumothorax or pulmonary edema. The visualized skeletal structures are unremarkable. IMPRESSION: Stable positioning of right pleural drainage catheter. No significant residual pleural fluid. Stable mild atelectasis at the right lung base with elevation of the right hemidiaphragm. Electronically Signed   By: Marcey Moan M.D.   On: 10/17/2023 09:28   US  RENAL Result Date: 10/17/2023 CLINICAL  DATA:  [id number]. Renal failure. Prior cystectomy with right lower quadrant urostomy. Surgery date 05/22/2023. EXAM: RENAL / URINARY TRACT ULTRASOUND COMPLETE COMPARISON:  CT without contrast 10/15/2023, renal ultrasound 07/24/2023 FINDINGS: Right Kidney: Renal measurements: Atrophic measuring 5.2 x 5.0 x 4.1 cm = volume: 55.9 mL, previously 69.6 mL. The cortex is thin and echogenic as before. There is moderate hydronephrosis which was seen on the CT yesterday but was  not seen on the ultrasound from 07/24/2023. 2 cm anechoic cyst in the upper pole is again shown. No mass is evident and no visible stones. Left Kidney: Renal measurements: 10.0 x 7.0 x 7.4 cm = volume: 275.8 mL. The renal cortex again is mildly echogenic but no more than previously. Severe hydronephrosis is again noted and was present on both prior studies. There is hypoechoic debris within some of the dilated calices which could be infectious or hemorrhagic debris. Correlate with urinalysis. There are multiple anechoic cysts, largest is 6.7 cm, as before. No complex cyst is seen. No visible stones. Bladder: Surgically absent. Other: None. IMPRESSION: 1. Moderate right hydronephrosis which was seen on the CT yesterday but was not seen on the ultrasound from 07/24/2023. 2. Severe left hydronephrosis which was present on both prior studies. There is hypoechoic debris within some of the dilated calices which could be infectious or hemorrhagic debris. Correlate with urinalysis. 3. Atrophic right kidney with thin and echogenic cortex as before. Mild but unchanged increased echogenicity of the left kidney. 4. Bilateral renal cysts. 5. Prior cystectomy with right lower quadrant urostomy. Electronically Signed   By: Francis Quam M.D.   On: 10/17/2023 02:14    Labs:  CBC: Recent Labs    10/17/23 0506 10/18/23 0510 10/19/23 0759 10/20/23 0537  WBC 9.4 9.4 12.6* 11.0*  HGB 7.6* 8.6* 10.0* 8.7*  HCT 23.4* 27.1* 31.7* 26.4*  PLT 255 311 348 318    COAGS: Recent Labs    05/28/23 1238 07/22/23 0408 07/22/23 0436 07/23/23 0350 09/30/23 1831 10/01/23 0600 10/14/23 2237  INR 1.2   < >  --  1.2 1.2 1.2 1.2  APTT 33  --  36  --   --  50* 40*   < > = values in this interval not displayed.    BMP: Recent Labs    10/17/23 0506 10/18/23 0510 10/18/23 1213 10/19/23 0759  NA 133* 135 139 139  K 3.6 3.9 4.0 4.5  CL 105 110 111 111  CO2 17* 16* 18* 15*  GLUCOSE 99 115* 109* 114*  BUN 74* 70* 68*  63*  CALCIUM 9.2 9.4 9.6 10.3  CREATININE 3.14* 2.53* 2.59* 2.44*  GFRNONAA 20* 26* 25* 27*    LIVER FUNCTION TESTS: Recent Labs    10/14/23 2237 10/15/23 1437 10/17/23 0506 10/18/23 0510 10/19/23 0759  BILITOT 0.6  --  0.4 0.4 0.6  AST 24  --  19 15 32  ALT 17  --  19 16 16   ALKPHOS 157*  --  104 95 102  PROT 7.0 6.5 5.6* 5.7* 6.5  ALBUMIN 2.8*  --  1.9* 2.0* 2.2*    Assessment and Plan:  73 y.o. male with recurrent hydronephrosis now s/p cystoprostatectomy w/ placement of ileal conduit (Aug 2024) for bladder cancer  who developed worsening RF and bilateral hydronephrosis, he is s/p bilateral PCN placement by Dr. Luverne on 10/18/23.   VSS CBC stable  RF improving  Output 800 mL from left and 50 mL from right Left with clear urine, right with  blood tinged urine in the bag today. No clots.    Further treatment plan per [facility]/urology  Appreciate and agree with the plan.  Please call IR for questions and concerns, patient will be scheduled for bilateral PCN exchange in 8-12 weeks. Orders placed.     Electronically Signed: Toya VEAR Cousin, PA-C 10/20/2023, 10:29 AM   I spent a total of 15 Minutes at the the patient's bedside AND on the patient's hospital floor or unit, greater than 50% of which was counseling/coordinating care for bilateral PCN f/u.   This chart was dictated using voice recognition software.  Despite best efforts to proofread,  errors can occur which can change the documentation meaning.

## 2023-10-21 ENCOUNTER — Inpatient Hospital Stay (HOSPITAL_COMMUNITY): Payer: Medicare Other

## 2023-10-21 DIAGNOSIS — A419 Sepsis, unspecified organism: Secondary | ICD-10-CM | POA: Diagnosis not present

## 2023-10-21 DIAGNOSIS — R6520 Severe sepsis without septic shock: Secondary | ICD-10-CM | POA: Diagnosis not present

## 2023-10-21 LAB — MAGNESIUM: Magnesium: 2.3 mg/dL (ref 1.7–2.4)

## 2023-10-21 LAB — CBC WITH DIFFERENTIAL/PLATELET
Abs Immature Granulocytes: 0.06 10*3/uL (ref 0.00–0.07)
Basophils Absolute: 0.1 10*3/uL (ref 0.0–0.1)
Basophils Relative: 1 %
Eosinophils Absolute: 0.1 10*3/uL (ref 0.0–0.5)
Eosinophils Relative: 1 %
HCT: 27.6 % — ABNORMAL LOW (ref 39.0–52.0)
Hemoglobin: 9 g/dL — ABNORMAL LOW (ref 13.0–17.0)
Immature Granulocytes: 1 %
Lymphocytes Relative: 6 %
Lymphs Abs: 0.7 10*3/uL (ref 0.7–4.0)
MCH: 30.7 pg (ref 26.0–34.0)
MCHC: 32.6 g/dL (ref 30.0–36.0)
MCV: 94.2 fL (ref 80.0–100.0)
Monocytes Absolute: 0.6 10*3/uL (ref 0.1–1.0)
Monocytes Relative: 6 %
Neutro Abs: 9.4 10*3/uL — ABNORMAL HIGH (ref 1.7–7.7)
Neutrophils Relative %: 85 %
Platelets: 319 10*3/uL (ref 150–400)
RBC: 2.93 MIL/uL — ABNORMAL LOW (ref 4.22–5.81)
RDW: 16 % — ABNORMAL HIGH (ref 11.5–15.5)
WBC: 10.9 10*3/uL — ABNORMAL HIGH (ref 4.0–10.5)
nRBC: 0 % (ref 0.0–0.2)

## 2023-10-21 LAB — COMPREHENSIVE METABOLIC PANEL
ALT: 22 U/L (ref 0–44)
AST: 23 U/L (ref 15–41)
Albumin: 2 g/dL — ABNORMAL LOW (ref 3.5–5.0)
Alkaline Phosphatase: 120 U/L (ref 38–126)
Anion gap: 9 (ref 5–15)
BUN: 47 mg/dL — ABNORMAL HIGH (ref 8–23)
CO2: 19 mmol/L — ABNORMAL LOW (ref 22–32)
Calcium: 10.2 mg/dL (ref 8.9–10.3)
Chloride: 112 mmol/L — ABNORMAL HIGH (ref 98–111)
Creatinine, Ser: 2.15 mg/dL — ABNORMAL HIGH (ref 0.61–1.24)
GFR, Estimated: 32 mL/min — ABNORMAL LOW (ref >60)
Glucose, Bld: 99 mg/dL (ref 70–99)
Potassium: 3.5 mmol/L (ref 3.5–5.1)
Sodium: 140 mmol/L (ref 135–145)
Total Bilirubin: 0.3 mg/dL (ref 0.0–1.2)
Total Protein: 5.9 g/dL — ABNORMAL LOW (ref 6.5–8.1)

## 2023-10-21 LAB — PHOSPHORUS: Phosphorus: 3.6 mg/dL (ref 2.5–4.6)

## 2023-10-21 MED ORDER — POLYETHYLENE GLYCOL 3350 17 G PO PACK
17.0000 g | PACK | Freq: Every day | ORAL | Status: DC | PRN
Start: 2023-10-21 — End: 2023-10-26

## 2023-10-21 NOTE — Progress Notes (Signed)
 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 Recently hospitalized and treated for a UTI. Incidentally positive for COVID on admission.  No indication for any treatment for COVID. Was found to have a loculated pleural effusion on the right.  PCCM was consulted.  Underwent chest replacement.  Chest tube was removed on 1/12. Also found to have worsening hydronephrosis.  Urology was consulted.  Recommended bilateral PCN placement with IR.  Procedure was performed on 1/10.  Assessment and Plan: Severe sepsis secondary to pneumonia and recurrent UTI. Met SIRS criteria on admission with leukocytosis, fever, tachycardia. Source of infection is mostly pneumonia as well as a UTI. Urine culture growing Klebsiella and Enterococcus. Grew the same bacteria earlier during the earlier admission. Incidentally COVID-19 positive although not treated and so far doing well. Initially was on cefepime .  Now on Zosyn .  Later on transition to Zosyn  plus Zyvox  and currently on Levaquin  to target both bacteria with 1 antibiotic. Since the initial plan was for 7-day antibiotic course and Zosyn  will cover both bacteria's as well as Levaquin , last day of antibiotic would be on 1/14.  Malignant pleural effusion. Found to have loculated pleural effusion at the time of admission. PCCM was consulted. Underwent chest tube placement. Pleural fluid culture negative. Cytology appears to have squamous cells concerning for metastatic malignancy per discussion between pathology and Dr. Kara 1/10. Chest tube was removed on 1/12. PCCM is recommended that if  there is any reaccumulation Pleurx catheter would be beneficial for the patient.  AKI on CKD 3B. Baseline creatinine 1.6/1.8. On admission serum creatinine 2.9. Currently improving.  Likely combination of obstructive uropathy as well as prerenal etiologies in the setting of infection.  Malignant Bilateral hydronephrosis Has history of bladder cancer. Has AKI and UTI.   Ultrasound renal showed evidence of bilateral hydronephrosis. Urology recommended to bilateral PCN. IR consulted. Renal function improving.  Incidental COVID-19 virus infection COVID positive on 1/6, continue isolation until 1/16. Decision was made not to initiate any therapy. Do not think the COVID-19 is actually causing any significant infection right now.  For now we will continue to monitor.  Urothelial carcinoma of bladder  Imaging studies concerning for lung metastases, bone metastases and peritoneal metastases with evidence of likely right-sided malignant effusion Performance status is worsening with poor prognosis. Appreciate oncology consultation. Family still hopeful.  Goals of care, counseling/discussion Patient is suffering from numerous complications of likely metastatic urothelial malignancy Prognosis has potentially worsened throughout this prolonged hospitalization with numerous complications including a malignant right-sided pleural effusion renal injury and bilateral hydronephrosis in the setting of protein calorie malnutrition and continued weight loss Lengthy discussions with the patient and wife surrounding the patient's guarded prognosis nearly daily.  Patient and his wife still wish to pursue all avenues of therapy at this time.  Patient is full code. Palliative care following, their input is appreciated.  Essential hypertension Continue amlodipine   Seizure disorder Continue Keppra   Pressure injury of contiguous region involving left buttock and hip, stage 1, Present on admission  Continue  dressing changes.  Normocytic anemia, folic acid  deficiency as well as iron  deficiency anemia. H&H stable. No bleeding.  Monitor. No clinical evidence of bleeding  Continue supplementation.  Adult failure to thrive. Severe protein calorie malnutrition. Body mass index is 18.05 kg/m.  Placing the patient at high risk for poor outcome. Interventions: Ensure Enlive (each supplement provides 350kcal and 20 grams of protein), MVI, Magic cup   Subjective: Reports diarrhea.  No nausea no vomiting.  Will stop stool softener.  No abdominal pain.  No fever no chills.  Oral intake per patient is adequate.  Physical Exam: General: in Mild distress, No Rash, cachectic appearing. Cardiovascular: S1 and S2 Present, No Murmur Respiratory: Good respiratory effort, Bilateral Air entry present.  Faint basal crackles, No wheezes Abdomen: Bowel Sound present, No tenderness Extremities: No edema Neuro: Alert and oriented x3, no new focal deficit  Data Reviewed: I have Reviewed nursing notes, Vitals, and Lab results. Since last encounter, pertinent lab results CBC and CMP   . I have ordered test including CBC and CMP  . I have independently visualized and interpreted imaging chest x-ray which showed mild increase in right-sided fluid  .   Disposition: Status is: Inpatient Remains inpatient appropriate because: Monitor for arrangement of SNF, repeat chest x-ray tomorrow.  heparin  injection 5,000 Units Start: 10/15/23 1400 SCDs Start: 10/15/23 0857   Family Communication: Family at bedside Level of care: Progressive   Vitals:   10/20/23 2144 10/21/23 0454 10/21/23 0903 10/21/23 0904  BP: 134/80 131/73    Pulse: (!) 102 97    Resp: 20 20    Temp: 98.5 F (36.9 C) 98 F (36.7 C)    TempSrc: Oral Oral    SpO2: 100% 96% 96% 96%  Weight:      Height:         Author: Yetta Blanch, MD 10/21/2023 6:41 PM  Please look on [email or web address] to find out who is on call.

## 2023-10-21 NOTE — TOC Initial Note (Signed)
 Transition of Care [facility]) - Initial/Assessment Note    Patient Details  Name: Wesley [name] MRN: [id number] Date of Birth: October 08, 1951  [facility]) CM/SW Contact:    Bascom Service, RN Phone Number: 10/21/2023, 2:34 PM  Clinical Narrative: Faxed out await choice.                  Expected Discharge Plan: Skilled Nursing Facility Barriers to Discharge: Continued Medical Work up   Patient Goals and CMS Choice Patient states their goals for this hospitalization and ongoing recovery are:: Home CMS Medicare.gov Compare Post Acute Care list provided to:: Patient        Expected Discharge Plan and Services                                              Prior Living Arrangements/Services                       Activities of Daily Living   ADL Screening (condition at time of admission) Independently performs ADLs?: No Does the patient have a NEW difficulty with bathing/dressing/toileting/self-feeding that is expected to last >3 days?: Yes (Initiates electronic notice to provider for possible OT consult) Does the patient have a NEW difficulty with getting in/out of bed, walking, or climbing stairs that is expected to last >3 days?: Yes (Initiates electronic notice to provider for possible PT consult) Does the patient have a NEW difficulty with communication that is expected to last >3 days?: No Is the patient deaf or have difficulty hearing?: No Does the patient have difficulty seeing, even when wearing glasses/contacts?: No Does the patient have difficulty concentrating, remembering, or making decisions?: No  Permission Sought/Granted                  Emotional Assessment              Admission diagnosis:  AKI (acute kidney injury) (HCC) [N17.9] Severe sepsis (HCC) [A41.9, R65.20] Sepsis with acute renal failure without septic shock, due to unspecified organism, unspecified acute renal failure type (HCC) [A41.9, R65.20,  N17.9] COVID-19 [U07.1] Patient Active Problem List   Diagnosis Date Noted   VRE (vancomycin -resistant Enterococci) infection 10/19/2023   Acute renal failure superimposed on stage 3b chronic kidney disease (HCC) 10/16/2023   Pressure injury of contiguous region involving left buttock and hip, stage 1 10/16/2023   Pleural effusion on right 10/16/2023   COVID-19 virus infection 10/16/2023   Seizure disorder (HCC) 10/16/2023   Normocytic anemia 10/16/2023   Severe sepsis (HCC) 10/15/2023   CKD (chronic kidney disease) stage 4, GFR 15-29 ml/min (HCC) 09/30/2023   Ulcerative colitis (HCC) 09/30/2023   UTI (urinary tract infection) 09/30/2023   Other constipation 07/23/2023   CKD stage 3b, GFR 30-44 ml/min (HCC) 07/22/2023   Complex partial seizures (HCC) 07/22/2023   Sepsis secondary to UTI (HCC) 07/22/2023   Essential hypertension 07/22/2023   Protein-calorie malnutrition, severe 07/22/2023   Goals of care, counseling/discussion 05/20/2023   Renal stone 03/29/2023   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) 02/22/2023   Hematuria 03/01/2012   Acute retention of urine 03/01/2012   PCP:  [name], MD Pharmacy:   [phone] - [place], [place] - [address] [place]  [address] Phone: [phone] Fax: [phone]  Social Drivers of Health (SDOH) Social History: SDOH Screenings   Food Insecurity: No Food Insecurity (10/15/2023)  Housing: Low Risk  (10/15/2023)  Transportation Needs: No Transportation Needs (10/15/2023)  Utilities: Not At Risk (10/15/2023)  Alcohol Screen: Low Risk  (02/26/2023)  Depression (PHQ2-9): Low Risk  (02/26/2023)  Financial Resource Strain: Low Risk  (09/26/2023)   Received from [facility]  Physical Activity: Insufficiently Active (09/26/2023)   Received from [facility]  Social Connections: Socially Integrated (10/15/2023)  Stress: No Stress Concern Present (09/26/2023)    Received from [facility]  Tobacco Use: Medium Risk (10/15/2023)   SDOH Interventions:     Readmission Risk Interventions    10/16/2023    3:44 PM 05/23/2023   10:18 AM  Readmission Risk Prevention Plan  Post Dischage Appt  Complete  Medication Screening  Complete  Transportation Screening Complete Complete  PCP or Specialist Appt within 3-5 Days Complete   HRI or Home Care Consult Complete   Social Work Consult for Recovery Care Planning/Counseling Complete   Palliative Care Screening Not Applicable   Medication Review Oceanographer) Complete

## 2023-10-21 NOTE — Evaluation (Signed)
 Physical Therapy RE-Evaluation Patient Details Name: Wesley [name] MRN: [id number] DOB: [birth year] Today's Date: 10/21/2023  History of Present Illness  73 yo male admitted with Pna, Sepsis, pleural effusion. S/P chest tube placement. Hx of essential tremor, anemia, hiatal heernia, meningioma resection with residual hearing loss, CKD, ulcerative colitis, Sz d/o, TURBT, bony mets, falls, vertigo, bladder Ca, squamous cell ca, COVID  Clinical Impression  Pt admitted as above and presenting with functional mobility limitations 2* significant generalized weakness, very poor activity tolerance and balance deficits.  This date, pt requiring increased time and assist of 2 for safe performance of minimal basic mobility tasks.  Patient will benefit from continued inpatient follow up therapy, <3 hours/day to maximize IND and safety prior to return home.  Spouse consulted following session and she confirmed she is unable to provided current level of assist needed to manage pt at home and feels follow up rehab is appropriate.       If plan is discharge home, recommend the following: A lot of help with walking and/or transfers;A lot of help with bathing/dressing/bathroom;Assistance with cooking/housework;Assist for transportation;Help with stairs or ramp for entrance   Can travel by private vehicle   No    Equipment Recommendations None recommended by PT  Recommendations for Other Services       Functional Status Assessment Patient has had a recent decline in their functional status and demonstrates the ability to make significant improvements in function in a reasonable and predictable amount of time.     Precautions / Restrictions Precautions Precautions: Fall Precaution Comments: chest tube, bilateral nephrostomy tubes Restrictions Weight Bearing Restrictions Per Provider Order: No      Mobility  Bed Mobility Overal bed mobility: Needs Assistance Bed Mobility: Supine to Sit      Supine to sit: Contact guard, HOB elevated, Used rails     General bed mobility comments: increased time and effort. cueing for technique with line management    Transfers Overall transfer level: Needs assistance Equipment used: Rolling walker (2 wheels) Transfers: Sit to/from Stand, Bed to chair/wheelchair/BSC Sit to Stand: Min assist, From elevated surface   Step pivot transfers: Min assist       General transfer comment: assist to bring wt up and fwd and balance in initial standing with RW.  Steady assist to step fwd and step pvt for transfer to recliner    Ambulation/Gait Ambulation/Gait assistance: Min assist Gait Distance (Feet): 2 Feet Assistive device: Rolling walker (2 wheels) Gait Pattern/deviations: Step-to pattern, Decreased step length - right, Decreased step length - left, Shuffle, Trunk flexed       General Gait Details: Assist to steady pt and manage RW. May need +2 chair follow.  Stairs            Wheelchair Mobility     Tilt Bed    Modified Rankin (Stroke Patients Only)       Balance Overall balance assessment: Needs assistance   Sitting balance-Leahy Scale: Fair Sitting balance - Comments: Tend to lean laterally or forwards to prop   Standing balance support: Bilateral upper extremity supported, During functional activity, Reliant on assistive device for balance Standing balance-Leahy Scale: Poor                               Pertinent Vitals/Pain Pain Assessment Pain Assessment: 0-10 Pain Score: 2  Pain Location: abdomen Pain Descriptors / Indicators: Grimacing Pain Intervention(s): Limited activity within patient's tolerance,  Monitored during session    Home Living Family/patient expects to be discharged to:: Private residence Living Arrangements: Spouse/significant other Available Help at Discharge: Family Type of Home: House Home Access: Stairs to enter   [employer] of Steps: 1   Home Layout:  Two level;Able to live on main level with bedroom/bathroom Home Equipment: None      Prior Function Prior Level of Function : Independent/Modified Independent                     Extremity/Trunk Assessment   Upper Extremity Assessment Upper Extremity Assessment: Generalized weakness    Lower Extremity Assessment Lower Extremity Assessment: Generalized weakness    Cervical / Trunk Assessment Cervical / Trunk Assessment: Normal  Communication   Communication Communication: No apparent difficulties  Cognition Arousal: Alert Behavior During Therapy: WFL for tasks assessed/performed, Flat affect Overall Cognitive Status: Within Functional Limits for tasks assessed                                          General Comments      Exercises     Assessment/Plan    PT Assessment Patient needs continued PT services  PT Problem List Decreased strength;Decreased activity tolerance;Decreased balance;Decreased mobility;Pain;Decreased knowledge of use of DME;Cardiopulmonary status limiting activity       PT Treatment Interventions      PT Goals (Current goals can be found in the Care Plan section)  Acute Rehab PT Goals Patient Stated Goal: REgain IND PT Goal Formulation: With patient/family Time For Goal Achievement: 10/30/23 Potential to Achieve Goals: Good    Frequency Min 1X/week     Co-evaluation               AM-PAC PT 6 Clicks Mobility  Outcome Measure Help needed turning from your back to your side while in a flat bed without using bedrails?: A Little Help needed moving from lying on your back to sitting on the side of a flat bed without using bedrails?: A Little Help needed moving to and from a bed to a chair (including a wheelchair)?: A Little Help needed standing up from a chair using your arms (e.g., wheelchair or bedside chair)?: A Little Help needed to walk in hospital room?: A Lot Help needed climbing 3-5 steps with a  railing? : Total 6 Click Score: 15    End of Session Equipment Utilized During Treatment: Oxygen Activity Tolerance: Patient limited by fatigue Patient left: in chair;with call bell/phone within reach;with chair alarm set Nurse Communication: Mobility status PT Visit Diagnosis: Muscle weakness (generalized) (M62.81);Difficulty in walking, not elsewhere classified (R26.2)    Time: 8895-8861 PT Time Calculation (min) (ACUTE ONLY): 34 min   Charges:   PT Evaluation $PT Re-evaluation: 1 Re-eval   PT General Charges $$ ACUTE PT VISIT: 1 Visit         Katrinka Acton PT Acute Rehabilitation Services Pager [phone] Office [phone]   Shizuko Wojdyla 10/21/2023, 1:20 PM

## 2023-10-21 NOTE — NC FL2 (Signed)
 [place]  MEDICAID FL2 LEVEL OF CARE FORM     IDENTIFICATION  Patient Name: Wesley [name] Birthdate: Apr 05, 1951 Sex: male Admission Date (Current Location): 10/14/2023  [facility] and [place] Number:  Producer, Television/film/video and Address:  [facility],  [address], [place] [address]      Provider Number: [id number]  Attending Physician Name and Address:  Tobie Yetta HERO, MD  Relative Name and Phone Number:  Traye Bates [phone]    Current Level of Care: Hospital Recommended Level of Care: Skilled Nursing Facility Prior Approval Number:    Date Approved/Denied:   PASRR Number: [id number]  Discharge Plan: SNF    Current Diagnoses: Patient Active Problem List   Diagnosis Date Noted   VRE (vancomycin -resistant Enterococci) infection 10/19/2023   Acute renal failure superimposed on stage 3b chronic kidney disease (HCC) 10/16/2023   Pressure injury of contiguous region involving left buttock and hip, stage 1 10/16/2023   Pleural effusion on right 10/16/2023   COVID-19 virus infection 10/16/2023   Seizure disorder (HCC) 10/16/2023   Normocytic anemia 10/16/2023   Severe sepsis (HCC) 10/15/2023   CKD (chronic kidney disease) stage 4, GFR 15-29 ml/min (HCC) 09/30/2023   Ulcerative colitis (HCC) 09/30/2023   UTI (urinary tract infection) 09/30/2023   Other constipation 07/23/2023   CKD stage 3b, GFR 30-44 ml/min (HCC) 07/22/2023   Complex partial seizures (HCC) 07/22/2023   Sepsis secondary to UTI (HCC) 07/22/2023   Essential hypertension 07/22/2023   Protein-calorie malnutrition, severe 07/22/2023   Goals of care, counseling/discussion 05/20/2023   Renal stone 03/29/2023   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) 02/22/2023   Hematuria 03/01/2012   Acute retention of urine 03/01/2012    Orientation RESPIRATION BLADDER Height & Weight     Self, Time, Situation, Place  Normal Urostomy Weight: 47.7 kg Height:  5' 4  (162.6 cm)  BEHAVIORAL SYMPTOMS/MOOD NEUROLOGICAL BOWEL NUTRITION STATUS      Continent Diet (Regular)  AMBULATORY STATUS COMMUNICATION OF NEEDS Skin   Limited Assist Verbally Normal                       Personal Care Assistance Level of Assistance  Bathing, Feeding, Dressing Bathing Assistance: Limited assistance Feeding assistance: Limited assistance Dressing Assistance: Limited assistance     Functional Limitations Info  Sight, Hearing, Speech Sight Info: Impaired (eyeglasses) Hearing Info: Adequate Speech Info: Adequate    SPECIAL CARE FACTORS FREQUENCY  PT (By licensed PT), OT (By licensed OT)     PT Frequency: 5x week OT Frequency: 5x week            Contractures Contractures Info: Not present    Additional Factors Info  Code Status, Allergies Code Status Info: Full Allergies Info: Sulfa antibiotics           Current Medications (10/21/2023):  This is the current hospital active medication list Current Facility-Administered Medications  Medication Dose Route Frequency Provider Last Rate Last Admin   acetaminophen  (TYLENOL ) tablet 650 mg  650 mg Oral Q6H PRN Zella Katha HERO, MD   650 mg at 10/16/23 2157   Or   acetaminophen  (TYLENOL ) suppository 650 mg  650 mg Rectal Q6H PRN Zella, Mir M, MD       amLODipine  (NORVASC ) tablet 5 mg  5 mg Oral QHS Zella, Mir M, MD   5 mg at 10/20/23 2147   doxepin  (SINEQUAN ) capsule 25 mg  25 mg Oral QHS Zella, Mir  M, MD   25 mg at 10/20/23 2147   feeding supplement (ENSURE ENLIVE / ENSURE PLUS) liquid 237 mL  237 mL Oral BID BM Zella, Mir M, MD   237 mL at 10/21/23 1057   fluticasone  furoate-vilanterol (BREO ELLIPTA ) 100-25 MCG/ACT 1 puff  1 puff Inhalation Daily Zella, Mir M, MD   1 puff at 10/21/23 9096   folic acid  (FOLVITE ) tablet 1 mg  1 mg Oral Daily Shalhoub, George J, MD   1 mg at 10/21/23 1057   heparin  injection 5,000 Units  5,000 Units Subcutaneous Q8H Zella, Mir M, MD   5,000  Units at 10/21/23 0645   levETIRAcetam  (KEPPRA ) tablet 500 mg  500 mg Oral BID Zella, Mir M, MD   500 mg at 10/21/23 1057   [START ON 10/22/2023] levofloxacin  (LEVAQUIN ) tablet 500 mg  500 mg Oral Q48H Shalhoub, George J, MD       linaclotide  (LINZESS ) capsule 145 mcg  145 mcg Oral Daily Zella, Mir M, MD   145 mcg at 10/20/23 9160   meclizine  (ANTIVERT ) tablet 25 mg  25 mg Oral Q6H PRN Zella Katha HERO, MD       multivitamin with minerals tablet 1 tablet  1 tablet Oral Daily Shalhoub, Zachary PARAS, MD   1 tablet at 10/21/23 1057   ondansetron  (ZOFRAN ) tablet 4 mg  4 mg Oral Q6H PRN Zella, Mir M, MD       Or   ondansetron  (ZOFRAN ) injection 4 mg  4 mg Intravenous Q6H PRN Zella Katha HERO, MD       OXcarbazepine  ER TB24 600 mg  600 mg Oral Q1500 Ikramullah, Mir M, MD   600 mg at 10/20/23 1756   oxyCODONE  (Oxy IR/ROXICODONE ) immediate release tablet 5-7.5 mg  5-7.5 mg Oral Q3H PRN Anwar, Zeba, MD   7.5 mg at 10/21/23 0411   polyethylene glycol (MIRALAX  / GLYCOLAX ) packet 17 g  17 g Oral Daily Lonn, Ni, MD   17 g at 10/20/23 0844   primidone  (MYSOLINE ) tablet 100 mg  100 mg Oral QHS Zella, Mir M, MD   100 mg at 10/20/23 2147   senna-docusate (Senokot-S) tablet 2 tablet  2 tablet Oral BID Lonn Hicks, MD   2 tablet at 10/20/23 0840   sodium chloride  flush (NS) 0.9 % injection 5 mL  5 mL Intracatheter Q8H Luverne Aran, MD   5 mL at 10/20/23 2143   traZODone  (DESYREL ) tablet 25 mg  25 mg Oral QHS PRN Zella Katha HERO, MD   25 mg at 10/16/23 2156     Discharge Medications: Please see discharge summary for a list of discharge medications.  Relevant Imaging Results:  Relevant Lab Results:   Additional Information SS#[id number]  Escarlet Saathoff, Nathanel, RN

## 2023-10-22 ENCOUNTER — Inpatient Hospital Stay (HOSPITAL_COMMUNITY): Payer: Medicare Other

## 2023-10-22 DIAGNOSIS — A419 Sepsis, unspecified organism: Secondary | ICD-10-CM | POA: Diagnosis not present

## 2023-10-22 DIAGNOSIS — Z515 Encounter for palliative care: Principal | ICD-10-CM

## 2023-10-22 DIAGNOSIS — R6520 Severe sepsis without septic shock: Secondary | ICD-10-CM | POA: Diagnosis not present

## 2023-10-22 NOTE — Plan of Care (Signed)
  Problem: Education: Goal: Knowledge of risk factors and measures for prevention of condition will improve Outcome: Progressing   Problem: Respiratory: Goal: Will maintain a patent airway Outcome: Progressing   Problem: Pain Management: Goal: General experience of comfort will improve Outcome: Progressing   Problem: Safety: Goal: Ability to remain free from injury will improve Outcome: Progressing   Problem: Skin Integrity: Goal: Risk for impaired skin integrity will decrease Outcome: Progressing

## 2023-10-22 NOTE — Progress Notes (Signed)
  Daily Progress Note   Patient Name: Wesley [name]       Date: 10/22/2023 DOB: March 13, 1951  Age: 73 y.o. MRN#: [id number] Attending Physician: Tobie Yetta HERO, MD Primary Care Physician: [name], MD Admit Date: 10/14/2023 Length of Stay: 7 days  Upon review of EMR today, plan is for patient to attend rehab at a SNF once coordinated. Patient only required oxycodone  7.5mg  x 1 in past 24 hours for pain management. Can continue oxycodone  in the outpatient setting. Recommend follow up with palliative medicine team at [facility] for follow up in the outpatient setting. Have placed discharge order for this.   PMT following along peripherally. Please reach out if acute PMT needs arise.    Tinnie Radar, DO Palliative Care Provider PMT # [phone]

## 2023-10-22 NOTE — Progress Notes (Signed)
 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 Recently hospitalized and treated for a UTI. Incidentally positive for COVID on admission.  No indication for any treatment for COVID. Was found to have a loculated pleural effusion on the right.  PCCM was consulted.  Underwent chest replacement.  Chest tube was removed on 1/12. Also found to have worsening hydronephrosis.  Urology was consulted.  Recommended bilateral PCN placement with IR.  Procedure was performed on 1/10.  Assessment and Plan: Severe sepsis secondary to pneumonia and recurrent UTI. Met SIRS criteria on admission with leukocytosis, fever, tachycardia. Source of infection is mostly pneumonia as well as a UTI. Urine culture growing Klebsiella and Enterococcus. Grew the same bacteria earlier during the earlier admission. Incidentally COVID-19 positive although not treated and so far doing well. Initially was on cefepime .  Now on Zosyn .  Later on transition to Zosyn  plus Zyvox  and currently on Levaquin  to target both bacteria with 1 antibiotic.  Last day 1/14 for a total 7-day treatment course.  Malignant pleural effusion. Found to have loculated pleural effusion at the time of admission. PCCM was consulted. Underwent chest tube placement. Pleural fluid culture negative. Cytology appears to have squamous cells concerning for metastatic malignancy per discussion between pathology and Dr. Kara 1/10. Chest tube was removed on 1/12. PCCM is recommended that if there is any reaccumulation Pleurx catheter would be beneficial for the patient.  AKI on CKD  3B. Baseline creatinine 1.6/1.8. On admission serum creatinine 2.9. Currently improving.  Likely combination of obstructive uropathy as well as prerenal etiologies in the setting of infection.  Malignant Bilateral hydronephrosis Has history of bladder cancer. Has AKI and UTI.   Ultrasound renal showed evidence of bilateral hydronephrosis. Urology recommended to bilateral PCN. IR consulted. Renal function improving.  Incidental COVID-19 virus infection COVID positive on 1/6, continue isolation until 1/16. Decision was made not to initiate any therapy. Do not think the COVID-19 is actually causing any significant infection right now.  For now we will continue to monitor.  Urothelial carcinoma of bladder  Imaging studies concerning for lung metastases, bone metastases and peritoneal metastases with evidence of likely right-sided malignant effusion Performance status is worsening with poor prognosis. Appreciate oncology consultation. Family still hopeful.  Goals of care, counseling/discussion Patient is suffering from numerous complications of likely metastatic urothelial malignancy Prognosis has potentially worsened throughout this prolonged hospitalization with numerous complications including a malignant right-sided pleural effusion renal injury and bilateral hydronephrosis in the setting of protein calorie malnutrition and continued weight loss Lengthy discussions with the patient and wife surrounding the patient's guarded prognosis nearly daily.  Patient and his wife still wish to pursue all avenues of therapy at this time.  Patient is full code. Palliative care following, their input is appreciated. 1/14 conversation with wife and patient.  I informed them that currently patient does not have any acute medical issues but he has multiple chronic medical issues which are worrisome for poor prognosis.  I have informed that going through cardiac arrest event with CPR would not result in  favorable outcome for the patient.  Essential hypertension Continue amlodipine   Seizure disorder Continue Keppra   Pressure injury of contiguous region involving  left buttock and hip, stage 1, Present on admission  Continue dressing changes.  Normocytic anemia, folic acid  deficiency as well as iron  deficiency anemia. H&H stable. No bleeding.  Monitor. No clinical evidence of bleeding  Continue supplementation.  Adult failure to thrive. Severe protein calorie malnutrition. Body mass index is 18.05 kg/m.  Placing the patient at high risk for poor outcome. Interventions: Ensure Enlive (each supplement provides 350kcal and 20 grams of protein), MVI, Magic cup   Subjective: No nausea no vomiting.  Still has poor p.o. intake.  No diarrhea.  No abdominal pain.  Physical Exam: General: in Mild distress, No Rash, cachectic Cardiovascular: S1 and S2 Present, No Murmur Respiratory: Good respiratory effort, Bilateral Air entry present. No Crackles, No wheezes Abdomen: Bowel Sound present, No tenderness, on the left lower quadrant and nodule is present which is chronic in nature.  His right PCN as well as ileal conduit has some blood in that.  Left PCN is clear. Extremities: No edema Neuro: Alert and oriented x3, no new focal deficit  Data Reviewed: I have Reviewed nursing notes, Vitals, and Lab results. I have ordered test including CBC and BMP  .   Disposition: Status is: Inpatient Remains inpatient appropriate because: Awaiting placement.  heparin  injection 5,000 Units Start: 10/15/23 1400 SCDs Start: 10/15/23 0857   Family Communication: Family at bedside Level of care: Progressive   Vitals:   10/21/23 2208 10/22/23 0549 10/22/23 0848 10/22/23 1238  BP: (!) 156/88 (!) 144/81  134/77  Pulse: 91 97  (!) 109  Resp:    20  Temp:  98.5 F (36.9 C)  97.8 F (36.6 C)  TempSrc:  Oral  Oral  SpO2:  100% 94% 98%  Weight:      Height:         Author: Yetta Blanch,  MD 10/22/2023 6:38 PM  Please look on [email or web address] to find out who is on call.

## 2023-10-22 NOTE — TOC Progression Note (Signed)
 Transition of Care [facility]) - Progression Note    Patient Details  Name: Wesley [name] MRN: [id number] Date of Birth: [birth year]  Transition of Care [facility]) CM/SW Contact  Jumanah Hynson, Nathanel, RN Phone Number: 10/22/2023, 2:20 PM  Clinical Narrative:Bed offers given to spouse Debbie-await choice, prior auth.       Expected Discharge Plan: Skilled Nursing Facility Barriers to Discharge: Continued Medical Work up  Expected Discharge Plan and Services                                               Social Determinants of Health (SDOH) Interventions SDOH Screenings   Food Insecurity: No Food Insecurity (10/15/2023)  Housing: Low Risk  (10/15/2023)  Transportation Needs: No Transportation Needs (10/15/2023)  Utilities: Not At Risk (10/15/2023)  Alcohol Screen: Low Risk  (02/26/2023)  Depression (PHQ2-9): Low Risk  (02/26/2023)  Financial Resource Strain: Low Risk  (09/26/2023)   Received from [facility]  Physical Activity: Insufficiently Active (09/26/2023)   Received from [facility]  Social Connections: Socially Integrated (10/15/2023)  Stress: No Stress Concern Present (09/26/2023)   Received from [facility]  Tobacco Use: Medium Risk (10/15/2023)    Readmission Risk Interventions    10/16/2023    3:44 PM 05/23/2023   10:18 AM  Readmission Risk Prevention Plan  Post Dischage Appt  Complete  Medication Screening  Complete  Transportation Screening Complete Complete  PCP or Specialist Appt within 3-5 Days Complete   HRI or Home Care Consult Complete   Social Work Consult for Recovery Care Planning/Counseling Complete   Palliative Care Screening Not Applicable   Medication Review Oceanographer) Complete

## 2023-10-23 DIAGNOSIS — R6520 Severe sepsis without septic shock: Secondary | ICD-10-CM | POA: Diagnosis not present

## 2023-10-23 DIAGNOSIS — A419 Sepsis, unspecified organism: Secondary | ICD-10-CM | POA: Diagnosis not present

## 2023-10-23 LAB — CBC WITH DIFFERENTIAL/PLATELET
Abs Immature Granulocytes: 0.11 10*3/uL — ABNORMAL HIGH (ref 0.00–0.07)
Basophils Absolute: 0 10*3/uL (ref 0.0–0.1)
Basophils Relative: 0 %
Eosinophils Absolute: 0.1 10*3/uL (ref 0.0–0.5)
Eosinophils Relative: 1 %
HCT: 31.5 % — ABNORMAL LOW (ref 39.0–52.0)
Hemoglobin: 10 g/dL — ABNORMAL LOW (ref 13.0–17.0)
Immature Granulocytes: 1 %
Lymphocytes Relative: 8 %
Lymphs Abs: 0.8 10*3/uL (ref 0.7–4.0)
MCH: 30.2 pg (ref 26.0–34.0)
MCHC: 31.7 g/dL (ref 30.0–36.0)
MCV: 95.2 fL (ref 80.0–100.0)
Monocytes Absolute: 0.6 10*3/uL (ref 0.1–1.0)
Monocytes Relative: 6 %
Neutro Abs: 8.2 10*3/uL — ABNORMAL HIGH (ref 1.7–7.7)
Neutrophils Relative %: 84 %
Platelets: 475 10*3/uL — ABNORMAL HIGH (ref 150–400)
RBC: 3.31 MIL/uL — ABNORMAL LOW (ref 4.22–5.81)
RDW: 16.1 % — ABNORMAL HIGH (ref 11.5–15.5)
WBC: 9.8 10*3/uL (ref 4.0–10.5)
nRBC: 0 % (ref 0.0–0.2)

## 2023-10-23 LAB — COMPREHENSIVE METABOLIC PANEL
ALT: 27 U/L (ref 0–44)
AST: 30 U/L (ref 15–41)
Albumin: 2.1 g/dL — ABNORMAL LOW (ref 3.5–5.0)
Alkaline Phosphatase: 145 U/L — ABNORMAL HIGH (ref 38–126)
Anion gap: 11 (ref 5–15)
BUN: 50 mg/dL — ABNORMAL HIGH (ref 8–23)
CO2: 18 mmol/L — ABNORMAL LOW (ref 22–32)
Calcium: 10.8 mg/dL — ABNORMAL HIGH (ref 8.9–10.3)
Chloride: 112 mmol/L — ABNORMAL HIGH (ref 98–111)
Creatinine, Ser: 1.85 mg/dL — ABNORMAL HIGH (ref 0.61–1.24)
GFR, Estimated: 38 mL/min — ABNORMAL LOW (ref >60)
Glucose, Bld: 117 mg/dL — ABNORMAL HIGH (ref 70–99)
Potassium: 3.2 mmol/L — ABNORMAL LOW (ref 3.5–5.1)
Sodium: 141 mmol/L (ref 135–145)
Total Bilirubin: 0.2 mg/dL (ref 0.0–1.2)
Total Protein: 6.1 g/dL — ABNORMAL LOW (ref 6.5–8.1)

## 2023-10-23 LAB — MAGNESIUM: Magnesium: 2.4 mg/dL (ref 1.7–2.4)

## 2023-10-23 MED ORDER — ADULT MULTIVITAMIN W/MINERALS CH: 1.0000 | ORAL_TABLET | Freq: Every day | ORAL | Status: DC

## 2023-10-23 MED ORDER — ENSURE ENLIVE PO LIQD: 237.0000 mL | Freq: Two times a day (BID) | ORAL | Status: DC

## 2023-10-23 MED ORDER — FOLIC ACID 1 MG PO TABS: 1.0000 mg | ORAL_TABLET | Freq: Every day | ORAL | Status: DC

## 2023-10-23 MED ORDER — POTASSIUM CHLORIDE CRYS ER 20 MEQ PO TBCR
40.0000 meq | EXTENDED_RELEASE_TABLET | ORAL | Status: AC
Start: 2023-10-23 — End: 2023-10-23
  Administered 2023-10-23 (×2): 40 meq via ORAL
  Filled 2023-10-23 (×2): qty 2

## 2023-10-23 NOTE — TOC Progression Note (Signed)
 Transition of Care [facility]) - Progression Note    Patient Details  Name: Wesley [name] MRN: [id number] Date of Birth: 1951-10-07  Transition of Care [facility]) CM/SW Contact  Harrie Cazarez, Thersia Flax, RN Phone Number: 10/23/2023, 10:58 AM  Clinical Narrative: Lauren(PT) please see today prior auth. I am still waiting on choice from Debbie(spouse) & patient.      Expected Discharge Plan: Skilled Nursing Facility Barriers to Discharge: Continued Medical Work up  Expected Discharge Plan and Services                                               Social Determinants of Health (SDOH) Interventions SDOH Screenings   Food Insecurity: No Food Insecurity (10/15/2023)  Housing: Low Risk  (10/15/2023)  Transportation Needs: No Transportation Needs (10/15/2023)  Utilities: Not At Risk (10/15/2023)  Alcohol Screen: Low Risk  (02/26/2023)  Depression (PHQ2-9): Low Risk  (02/26/2023)  Financial Resource Strain: Low Risk  (09/26/2023)   Received from [facility]  Physical Activity: Insufficiently Active (09/26/2023)   Received from [facility]  Social Connections: Socially Integrated (10/15/2023)  Stress: No Stress Concern Present (09/26/2023)   Received from [facility]  Tobacco Use: Medium Risk (10/15/2023)    Readmission Risk Interventions    10/16/2023    3:44 PM 05/23/2023   10:18 AM  Readmission Risk Prevention Plan  Post Dischage Appt  Complete  Medication Screening  Complete  Transportation Screening Complete Complete  PCP or Specialist Appt within 3-5 Days Complete   HRI or Home Care Consult Complete   Social Work Consult for Recovery Care Planning/Counseling Complete   Palliative Care Screening Not Applicable   Medication Review Oceanographer) Complete

## 2023-10-23 NOTE — Progress Notes (Signed)
 Physical Therapy Treatment Patient Details Name: Wesley [name] MRN: [id number] DOB: [birth year] Today's Date: 10/23/2023   History of Present Illness 73 yo male admitted with Pna, Sepsis, pleural effusion. S/P chest tube placement. Hx of essential tremor, anemia, hiatal heernia, meningioma resection with residual hearing loss, CKD, ulcerative colitis, Sz d/o, TURBT, bony mets, falls, vertigo, bladder Ca, squamous cell ca, COVID    PT Comments  Pt is slowly progressing toward acute PT goals this session with progression to forward ambulation. Pt ambulated ~77ft x2 with Min A and use of RW- required seated rest breaks between transfer to recliner and between bouts of ambulation due to reported fatigue. Pt will benefit from continued skilled PT to increase their independence and maximize safety with mobility.      If plan is discharge home, recommend the following: A lot of help with walking and/or transfers;A lot of help with bathing/dressing/bathroom;Assistance with cooking/housework;Assist for transportation;Help with stairs or ramp for entrance   Can travel by private vehicle     Yes  Equipment Recommendations  None recommended by PT    Recommendations for Other Services       Precautions / Restrictions Precautions Precautions: Fall Precaution Comments: chest tube (removed), bilateral nephrostomy tubes Restrictions Weight Bearing Restrictions Per Provider Order: No     Mobility  Bed Mobility Overal bed mobility: Needs Assistance Bed Mobility: Supine to Sit     Supine to sit: Contact guard, HOB elevated, Used rails     General bed mobility comments: increased time and effort. cueing for technique    Transfers Overall transfer level: Needs assistance Equipment used: Rolling walker (2 wheels) Transfers: Sit to/from Stand, Bed to chair/wheelchair/BSC Sit to Stand: Min assist   Step pivot transfers: Contact guard assist       General transfer comment: +2 provided for  safety. Able to stand and step with assist +1.    Ambulation/Gait Ambulation/Gait assistance: Min assist Gait Distance (Feet): 8 Feet Assistive device: Rolling walker (2 wheels) Gait Pattern/deviations: Step-to pattern, Decreased step length - right, Decreased step length - left, Shuffle, Trunk flexed Gait velocity: decr     General Gait Details: 58ft x2 with seated rest break between. mild knee flexion L>R. Assist to steady. Will benefit from chair follow for longer ambulation distance.   Stairs             Wheelchair Mobility     Tilt Bed    Modified Rankin (Stroke Patients Only)       Balance Overall balance assessment: Needs assistance Sitting-balance support: Bilateral upper extremity supported, Feet supported Sitting balance-Leahy Scale: Fair     Standing balance support: Bilateral upper extremity supported, During functional activity, Reliant on assistive device for balance Standing balance-Leahy Scale: Poor                              Cognition Arousal: Alert Behavior During Therapy: WFL for tasks assessed/performed                                   General Comments: Required x2 attempts for correct month- initially stating february.        Exercises      General Comments        Pertinent Vitals/Pain Pain Assessment Pain Assessment: No/denies pain    Home Living  Prior Function            PT Goals (current goals can now be found in the care plan section) Acute Rehab PT Goals Patient Stated Goal: REgain IND PT Goal Formulation: With patient/family Time For Goal Achievement: 10/30/23 Potential to Achieve Goals: Good Progress towards PT goals: Progressing toward goals    Frequency    Min 1X/week      PT Plan      Co-evaluation              AM-PAC PT "6 Clicks" Mobility   Outcome Measure  Help needed turning from your back to your side while in a flat bed  without using bedrails?: A Little Help needed moving from lying on your back to sitting on the side of a flat bed without using bedrails?: A Little Help needed moving to and from a bed to a chair (including a wheelchair)?: A Little Help needed standing up from a chair using your arms (e.g., wheelchair or bedside chair)?: A Little Help needed to walk in hospital room?: A Little Help needed climbing 3-5 steps with a railing? : Total 6 Click Score: 16    End of Session Equipment Utilized During Treatment: Gait belt Activity Tolerance: Patient limited by fatigue Patient left: in chair;with call bell/phone within reach;with chair alarm set Nurse Communication: Mobility status PT Visit Diagnosis: Muscle weakness (generalized) (M62.81);Difficulty in walking, not elsewhere classified (R26.2)     Time: 1610-9604 PT Time Calculation (min) (ACUTE ONLY): 29 min  Charges:    $Therapeutic Activity: 23-37 mins PT General Charges $$ ACUTE PT VISIT: 1 Visit                     Faustine Hoof PT, DPT  Acute Rehabilitation Services  Office [phone]  10/23/2023, 1:04 PM

## 2023-10-23 NOTE — Progress Notes (Signed)
 Occupational Therapy Treatment Patient Details Name: Wesley [name] MRN: [id number] DOB: Mar 19, 1951 Today's Date: 10/23/2023   History of present illness 73 yo male admitted with Pna, Sepsis, pleural effusion. S/P chest tube placement. Hx of essential tremor, anemia, hiatal heernia, meningioma resection with residual hearing loss, CKD, ulcerative colitis, Sz d/o, TURBT, bony mets, falls, vertigo, bladder Ca, squamous cell ca, COVID   OT comments  Pt in bed upon arrival and reports fatigue from working with PT earlier sitting up in recliner and pain from rolling in bed for using staff to assist him with care. Pt agreeable to long sitting activity in bed with use of L rail for sup - sit and to maintain sitting. Pt would benefit from post acute rehab setting to maximize level of function and safety to return home following acute hospital stay      If plan is discharge home, recommend the following:  A little help with walking and/or transfers;Assist for transportation;A lot of help with bathing/dressing/bathroom   Equipment Recommendations  BSC/3in1    Recommendations for Other Services      Precautions / Restrictions Precautions Precautions: Fall Precaution Comments: chest tube (removed), bilateral nephrostomy tubes Restrictions Weight Bearing Restrictions Per Provider Order: No       Mobility Bed Mobility               General bed mobility comments: Pt agreeable to long sititng in bed, pt very fatigued after being OOB seated in chair from working PT this morning and rolling in bed for nurinsg staff to provide care    Transfers                         Balance Overall balance assessment: Needs assistance Sitting-balance support: Bilateral upper extremity supported, Feet supported Sitting balance-Leahy Scale: Fair Sitting balance - Comments: long sitting with use of L rail to maintain balance                                   ADL either  performed or assessed with clinical judgement   ADL                                              Extremity/Trunk Assessment Upper Extremity Assessment Upper Extremity Assessment: Generalized weakness   Lower Extremity Assessment Lower Extremity Assessment: Defer to PT evaluation        Vision Baseline Vision/History: 1 Wears glasses Ability to See in Adequate Light: 0 Adequate Patient Visual Report: No change from baseline     Perception     Praxis      Cognition Arousal: Alert Behavior During Therapy: WFL for tasks assessed/performed Overall Cognitive Status: Within Functional Limits for tasks assessed                                          Exercises      Shoulder Instructions       General Comments      Pertinent Vitals/ Pain       Pain Assessment Pain Assessment: 0-10 Pain Score: 5  Pain Location: abdomen, back Pain Descriptors / Indicators: Sore Pain Intervention(s): Limited activity within patient's tolerance,  Monitored during session, Repositioned  Home Living                                          Prior Functioning/Environment              Frequency  Min 2X/week        Progress Toward Goals  OT Goals(current goals can now be found in the care plan section)  Progress towards OT goals: OT to reassess next treatment     Plan      Co-evaluation                 AM-PAC OT "6 Clicks" Daily Activity     Outcome Measure   Help from another person eating meals?: None Help from another person taking care of personal grooming?: A Little Help from another person toileting, which includes using toliet, bedpan, or urinal?: A Little Help from another person bathing (including washing, rinsing, drying)?: A Little Help from another person to put on and taking off regular upper body clothing?: A Little Help from another person to put on and taking off regular lower body clothing?: A  Little 6 Click Score: 19    End of Session    OT Visit Diagnosis: Muscle weakness (generalized) (M62.81);Pain;Other abnormalities of gait and mobility (R26.89)   Activity Tolerance Patient limited by fatigue   Patient Left in bed;with call bell/phone within reach;with family/visitor present;with bed alarm set   Nurse Communication          Time: 6045-4098 OT Time Calculation (min): 18 min  Charges: OT General Charges $OT Visit: 1 Visit OT Treatments $Therapeutic Activity: 8-22 mins    Alfred Ann 10/23/2023, 3:31 PM

## 2023-10-23 NOTE — Plan of Care (Signed)
  Problem: Coping: Goal: Psychosocial and spiritual needs will be supported Outcome: Progressing   Problem: Respiratory: Goal: Will maintain a patent airway Outcome: Progressing Goal: Complications related to the disease process, condition or treatment will be avoided or minimized Outcome: Progressing   Problem: Nutrition: Goal: Adequate nutrition will be maintained Outcome: Progressing

## 2023-10-23 NOTE — Discharge Summary (Addendum)
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  He presented to the hospital for fever.  In the ED he was found to have a WBC count of 26 with a creatinine of 2.6.  He was found to have a right lower lobe infiltrate.  Blood and urine cultures were obtained and he was started on IV antibiotics.  He was also found to be incidentally COVID-positive. 1/7 right-sided chest tube insertion which resulted in a 15% pneumothorax Antibiotics for possible underlying pneumonia continued 1/9 pleural fibrinolysis 1/10 urology consulted for rising creatinine and new moderate right-sided hydronephrosis along with a pre-existing left-sided hydronephrosis and bilateral percutaneous nephrostomy tubes placed 1/12-chest tube removed  Principal Problem:   Severe sepsis (HCC) - right-sided pneumonia but possibly also UTI as urine culture grew out Klebsiella and Enterococcus gallinarum on 1/6 -Pleural fluid culture was negative -He has completed 7 days of antibiotics  Active Problems: Pleural effusion-   Malignant? 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  Incidentally COVID-positive on 1/6   Disposition: We have been having daily palliative discussions-- I spoke with the patient and his wife. It was initially planned for him to go to rehab. After further discussions with oncology and palliative care regarding poor prognosis, the patient is considering home with  hospice but his wife is uncertain if she will be able to manage him. Currently he very weak but is is ambulatory (independently) at least to the bathroom.  He is beginning to eat more solids.  He is declining to be a DNR and to transition to [facility].  He tells me he understands his cancer is widely metastatic. I have explained to both him and his wife that we can arrange hospice at home and they can ultimately be transition to [facility] from home if he continues to decline.    Discharge Instructions  Discharge Instructions     Amb Referral to Palliative Care   Complete by: As directed    No wound care   Complete by: As directed  Allergies as of 10/23/2023       Reactions   Sulfa Antibiotics Rash   Childhood        Medication List     STOP taking these medications    celecoxib 100 MG capsule Commonly known as: CELEBREX   mirtazapine 15 MG tablet Commonly known as: Remeron       TAKE these medications     amLODipine 5 MG tablet Commonly known as: NORVASC Take 5 mg by mouth at bedtime.   Breo Ellipta 100-25 MCG/ACT Aepb Generic drug: fluticasone furoate-vilanterol INHALE 1 PUFF BY MOUTH EVERY DAY What changed: See the new instructions.   doxepin 25 MG capsule Commonly known as: SINEQUAN Take 25 mg by mouth at bedtime.   feeding supplement Liqd Take 237 mLs by mouth 2 (two) times daily between meals.   ferrous sulfate 325 (65 FE) MG tablet Take 325 mg by mouth every other day.   finasteride 5 MG tablet Commonly known as: PROSCAR Take 5 mg by mouth daily at 12 noon.   folic acid 1 MG tablet Commonly known as: FOLVITE Take 1 tablet (1 mg total) by mouth daily. Start taking on: October 24, 2023   ketoconazole 2 % shampoo Commonly known as: NIZORAL Apply 1 Application topically daily as needed (yeast). What changed: Another medication with the same name was removed. Continue taking this medication, and follow the directions you see here.   levETIRAcetam 500 MG tablet Commonly known as: KEPPRA Take 500 mg by mouth 3 (three) times daily.   linaclotide 145 MCG Caps capsule Commonly known as: LINZESS Take 145 mcg by mouth daily.   Meclizine HCl 25 MG Chew Chew 1 tablet by mouth every 6 (six) hours as needed (dizziness).   multivitamin with minerals Tabs tablet Take 1 tablet by mouth daily. Start taking on: October 24, 2023   Oxtellar XR 600 MG Tb24 Generic drug: OXcarbazepine ER Take 600 mg by mouth daily in the afternoon.   polyethylene glycol 17 g packet Commonly known as: MIRALAX / GLYCOLAX Take 17 g by mouth 2 (two) times daily. What changed:  when to take this reasons to take this   primidone 50 MG tablet Commonly known as: MYSOLINE Take 100 mg by mouth at bedtime.   ustekinumab 90 MG/ML Sosy injection Commonly known as: STELARA Inject 90 mg into the skin every 8 (eight) weeks.   vitamin B-12 500 MCG tablet Commonly known as: CYANOCOBALAMIN Take 500  mcg by mouth daily.            The results of significant diagnostics from this hospitalization (including imaging, microbiology, ancillary and laboratory) are listed below for reference.    DG CHEST PORT 1 VIEW Result Date: 10/22/2023 CLINICAL DATA:  Malignant pleural effusion. EXAM: PORTABLE CHEST 1 VIEW COMPARISON:  October 21, 2023. FINDINGS: The heart size and mediastinal contours are within normal limits. Left lung is clear. Elevated right hemidiaphragm is noted. Minimal right basilar subsegmental atelectasis or scarring is noted with probable small pleural effusion. The visualized skeletal structures are unremarkable. IMPRESSION: Elevated right hemidiaphragm. Minimal right basilar subsegmental atelectasis or scarring is noted probable small pleural effusion. Electronically Signed   By: Lupita Raider M.D.   On: 10/22/2023 10:49   DG CHEST PORT 1 VIEW Result Date: 10/21/2023 CLINICAL DATA:  Pleural effusion EXAM: PORTABLE CHEST 1 VIEW COMPARISON:  10/18/2023 FINDINGS: Interval removal of right basilar chest tube. Large right pleural effusion with right lower lobe atelectasis or infiltrate. No pneumothorax. Left  lung clear. Heart and mediastinal contours within normal limits. IMPRESSION: Interval removal of right chest tube with residual large right pleural effusion. No pneumothorax. Right lower lobe atelectasis or infiltrate. Electronically Signed   By: Charlett Nose M.D.   On: 10/21/2023 17:41   IR NEPHROSTOMY PLACEMENT RIGHT Result Date: 10/18/2023 CLINICAL DATA:  History of bladder carcinoma with cystectomy and ileal conduit. Bilateral hydronephrosis and need for nephrostomy tube diversion. EXAM: 1. ULTRASOUND GUIDANCE FOR PUNCTURE OF THE LEFT RENAL COLLECTING SYSTEM 2. LEFT PERCUTANEOUS NEPHROSTOMY TUBE PLACEMENT 3. ULTRASOUND GUIDANCE FOR PUNCTURE OF THE RIGHT RENAL COLLECTING SYSTEM 4. RIGHT PERCUTANEOUS NEPHROSTOMY TUBE PLACEMENT COMPARISON:  Renal ultrasound on 10/17/2023  ANESTHESIA/SEDATION: Moderate (conscious) sedation was employed during this procedure. A total of Versed 1.5 mg and Fentanyl 75 mcg was administered intravenously. Moderate Sedation Time: 28 minutes. The patient's level of consciousness and vital signs were monitored continuously by radiology nursing throughout the procedure under my direct supervision. CONTRAST:  25 mL Omnipaque 300 MEDICATIONS: No additional medications. The patient is on scheduled IV Zosyn and did not receive additional antibiotics. FLUOROSCOPY TIME:  2 minutes and 42 seconds.  1.0 mGy. PROCEDURE: The procedure, risks, benefits, and alternatives were explained to the patient. Questions regarding the procedure were encouraged and answered. The patient understands and consents to the procedure. A time-out was performed prior to the procedure. Both flank regions were prepped with chlorhexidine in a sterile fashion, and a sterile drape was applied covering the operative field. A sterile gown and sterile gloves were used for the procedure. Local anesthesia was provided with 1% Lidocaine. Ultrasound was used to localize both kidneys. Under direct ultrasound guidance, 21 gauge needles were advanced into the renal collecting systems. Ultrasound image documentation was performed. Aspiration of urine sample was performed followed by contrast injection. Transitional dilators were advanced over guidewires. Percutaneous tract dilatation was then performed over the guidewire. Ten French bilateral percutaneous nephrostomy tubes were then advanced and formed in the collecting systems. Catheter position was confirmed by fluoroscopy after contrast injection. Both catheters were attached to gravity bag drainage and secured at skin exit sites with Prolene retention sutures and StatLock devices. COMPLICATIONS: None. FINDINGS: After left nephrostomy tube placement, urine return was initially bloody/blood tinged. The tube was formed in the renal pelvis. 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 The nephrostomy tube was partially formed in the upper pole with extension into the region of the renal pelvis. Urine return is clear and slightly turbid. IMPRESSION: Bilateral nephrostomy tube placement. Ten French bilateral nephrostomy tubes were placed and attached to gravity bag drainage. Electronically Signed   By: Irish Lack M.D.   On: 10/18/2023 17:34   IR NEPHROSTOMY PLACEMENT LEFT Result Date: 10/18/2023 CLINICAL DATA:  History of bladder carcinoma with cystectomy and ileal conduit. Bilateral hydronephrosis and need for nephrostomy tube diversion. EXAM: 1. ULTRASOUND GUIDANCE FOR PUNCTURE OF THE LEFT RENAL COLLECTING SYSTEM 2. LEFT PERCUTANEOUS NEPHROSTOMY TUBE PLACEMENT 3. ULTRASOUND GUIDANCE FOR PUNCTURE OF THE RIGHT RENAL COLLECTING SYSTEM 4. RIGHT PERCUTANEOUS NEPHROSTOMY TUBE PLACEMENT COMPARISON:  Renal ultrasound on 10/17/2023 ANESTHESIA/SEDATION: Moderate (conscious) sedation was employed during this procedure. A total of Versed 1.5 mg and Fentanyl 75 mcg was administered intravenously. Moderate Sedation Time: 28 minutes. The patient's level of consciousness and vital signs were monitored continuously by radiology nursing throughout the procedure under my direct supervision. CONTRAST:  25 mL Omnipaque 300 MEDICATIONS: No additional medications. The patient is on scheduled IV  Zosyn and did not receive additional antibiotics. FLUOROSCOPY TIME:  2 minutes and 42 seconds.  1.0 mGy. PROCEDURE: The procedure, risks, benefits, and alternatives were explained to the patient. Questions regarding the procedure were encouraged and answered. The patient understands and consents to the procedure. A time-out was performed prior to the procedure. Both flank regions were prepped with chlorhexidine in a sterile fashion, and a sterile drape was applied covering the operative field. A sterile gown and sterile gloves  were used for the procedure. Local anesthesia was provided with 1% Lidocaine. Ultrasound was used to localize both kidneys. Under direct ultrasound guidance, 21 gauge needles were advanced into the renal collecting systems. Ultrasound image documentation was performed. Aspiration of urine sample was performed followed by contrast injection. Transitional dilators were advanced over guidewires. Percutaneous tract dilatation was then performed over the guidewire. Ten French bilateral percutaneous nephrostomy tubes were then advanced and formed in the collecting systems. Catheter position was confirmed by fluoroscopy after contrast injection. Both catheters were attached to gravity bag drainage and secured at skin exit sites with Prolene retention sutures and StatLock devices. COMPLICATIONS: None. FINDINGS: After left nephrostomy tube placement, urine return was initially bloody/blood tinged. The tube was formed in the renal pelvis. 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 The nephrostomy tube was partially formed in the upper pole with extension into the region of the renal pelvis. Urine return is clear and slightly turbid. IMPRESSION: Bilateral nephrostomy tube placement. Ten French bilateral nephrostomy tubes were placed and attached to gravity bag drainage. Electronically Signed   By: Irish Lack M.D.   On: 10/18/2023 17:34   DG CHEST PORT 1 VIEW Result Date: 10/18/2023 CLINICAL DATA:  [id number] with pleural effusion and chest tube in place. EXAM: PORTABLE CHEST 1 VIEW COMPARISON:  Portable chest yesterday at 5:17 a.m. FINDINGS: 4:58 a.m. pigtail right chest tube remains with the pigtail superimposing in the lateral lower chest alongside the elevated hemidiaphragm. There is no measurable pneumothorax. Small right pleural effusion has reaccumulated with adjacent hazy atelectasis or consolidation. The left lung and right upper lung fields are  clear. The cardiomediastinal silhouette is stable. There is aortic atherosclerosis. No vascular congestion is seen. No new osseous findings. Thoracic spondylosis and DDD. IMPRESSION: 1. Small right pleural effusion has reaccumulated with adjacent hazy atelectasis or consolidation. 2. Right chest tube remains in place. No measurable pneumothorax. 3. Aortic atherosclerosis. Electronically Signed   By: Almira Bar M.D.   On: 10/18/2023 06:57   DG CHEST PORT 1 VIEW Result Date: 10/17/2023 CLINICAL DATA:  Status post right chest tube placement for loculated right pleural effusion. EXAM: PORTABLE CHEST 1 VIEW COMPARISON:  10/16/2023 FINDINGS: The heart size and mediastinal contours are within normal limits. Stable positioning right pleural drainage catheter. No significant residual pleural fluid. Stable mild atelectasis at the right lung base with elevation of the right hemidiaphragm. No pneumothorax or pulmonary edema. The visualized skeletal structures are unremarkable. IMPRESSION: Stable positioning of right pleural drainage catheter. No significant residual pleural fluid. Stable mild atelectasis at the right lung base with elevation of the right hemidiaphragm. Electronically Signed   By: Irish Lack M.D.   On: 10/17/2023 09:28   US RENAL Result Date: 10/17/2023 CLINICAL DATA:  [id number]. Renal failure. Prior cystectomy with right lower quadrant urostomy. Surgery date 05/22/2023. EXAM: RENAL / URINARY TRACT ULTRASOUND COMPLETE COMPARISON:  CT without contrast 10/15/2023, renal ultrasound 07/24/2023 FINDINGS: Right Kidney: Renal measurements: Atrophic measuring  5.2 x 5.0 x 4.1 cm = volume: 55.9 mL, previously 69.6 mL. The cortex is thin and echogenic as before. There is moderate hydronephrosis which was seen on the CT yesterday but was not seen on the ultrasound from 07/24/2023. 2 cm anechoic cyst in the upper pole is again shown. No mass is evident and no visible stones. Left Kidney: Renal measurements: 10.0 x  7.0 x 7.4 cm = volume: 275.8 mL. The renal cortex again is mildly echogenic but no more than previously. Severe hydronephrosis is again noted and was present on both prior studies. There is hypoechoic debris within some of the dilated calices which could be infectious or hemorrhagic debris. Correlate with urinalysis. There are multiple anechoic cysts, largest is 6.7 cm, as before. No complex cyst is seen. No visible stones. Bladder: Surgically absent. Other: None. IMPRESSION: 1. Moderate right hydronephrosis which was seen on the CT yesterday but was not seen on the ultrasound from 07/24/2023. 2. Severe left hydronephrosis which was present on both prior studies. There is hypoechoic debris within some of the dilated calices which could be infectious or hemorrhagic debris. Correlate with urinalysis. 3. Atrophic right kidney with thin and echogenic cortex as before. Mild but unchanged increased echogenicity of the left kidney. 4. Bilateral renal cysts. 5. Prior cystectomy with right lower quadrant urostomy. Electronically Signed   By: Almira Bar M.D.   On: 10/17/2023 02:14   DG Chest Port 1 View Result Date: 10/16/2023 CLINICAL DATA:  Pleural effusion. EXAM: PORTABLE CHEST 1 VIEW COMPARISON:  October 15, 2023. FINDINGS: Right-sided chest tube is again noted.  No pneumothorax is noted. IMPRESSION: No definite pneumothorax seen currently. Right-sided chest tube is again noted. Electronically Signed   By: Lupita Raider M.D.   On: 10/16/2023 09:18   DG Chest Port 1 View Result Date: 10/15/2023 CLINICAL DATA:  Thoracostomy placement EXAM: PORTABLE CHEST 1 VIEW COMPARISON:  10/14/2023 FINDINGS: Chest tube in place on the right in the inferior pleural space. Moderate right pneumothorax, estimated at 30%. No tension. Volume loss persists at the left lung base. IMPRESSION: Chest tube in place on the right. Moderate right pneumothorax, estimated at 30%. No tension. Electronically Signed   By: Paulina Fusi M.D.   On:  10/15/2023 14:51   CT CHEST ABDOMEN PELVIS WO CONTRAST Result Date: 10/15/2023 CLINICAL DATA:  Set CIS, elevated white count. EXAM: CT CHEST, ABDOMEN AND PELVIS WITHOUT CONTRAST TECHNIQUE: Multidetector CT imaging of the chest, abdomen and pelvis was performed following the standard protocol without IV contrast. RADIATION DOSE REDUCTION: This exam was performed according to the departmental dose-optimization program which includes automated exposure control, adjustment of the mA and/or kV according to patient size and/or use of iterative reconstruction technique. COMPARISON:  CT abdomen and pelvis 09/30/2023.  Chest CT 07/19/2023 FINDINGS: CT CHEST FINDINGS Cardiovascular: Heart is normal size. Aorta is normal caliber. Diffuse 3 vessel coronary artery disease. Scattered aortic atherosclerosis. Mediastinum/Nodes: No mediastinal, hilar, or axillary adenopathy. Trachea and esophagus are unremarkable. Thyroid unremarkable. Lungs/Pleura: Loculated moderate right pleural effusion, increasing since prior chest CT. Airspace opacity in the right lower lobe and right middle lobe could reflect atelectasis or pneumonia. Predominantly linear opacities in the lingula and left lower lobe could reflect scarring or atelectasis. Subpleural nodularity noted along the left fissure and posteriorly. Left upper lobe nodule on image 37 measures 8 mm, stable since prior study. Left upper lobe nodule on image 36 measures 4 mm, new since prior study. Musculoskeletal: Chest wall soft tissues are  unremarkable. No acute bony abnormality. CT ABDOMEN PELVIS FINDINGS Hepatobiliary: 1.9 cm low-density lesion in the right hepatic lobe. 1.8 cm previously, difficult to characterize on this noncontrast study. Gallbladder unremarkable. No biliary ductal dilatation. Pancreas: No focal abnormality or ductal dilatation. Spleen: No focal abnormality.  Normal size. Adrenals/Urinary Tract: Adrenal glands unremarkable. Persistent bilateral hydronephrosis,  unchanged since prior study. Nonobstructing stones in the lower pole of the right kidney. Right kidney is atrophic. Multiple bilateral renal cysts are unchanged. Prior cystectomy with right lower quadrant urostomy. Ureters are difficult to follow without contrast. Stomach/Bowel: Moderate to large stool burden throughout the colon, similar to prior study. No bowel obstruction. Vascular/Lymphatic: Aortoiliac atherosclerosis. No evidence of aneurysm or adenopathy. 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 Other: No free fluid or free air. Musculoskeletal: Again noted is the lytic area within the left sacral ala and adjacent left iliac bone, unchanged since recent study. Degenerative disc and facet disease in the lumbar spine. IMPRESSION: Enlarging chronic loculated right pleural effusion and increasing right lower lobe airspace disease. This could reflect atelectasis or pneumonia. Nodularity noted along the left fissure and subpleural locations, increasing since prior study. Cannot exclude metastases. New 4 mm left upper lobe pulmonary nodule. Recommend attention on follow-up imaging. Diffuse coronary artery disease.  Aortic atherosclerosis. 1.9 cm low-density lesion in the right hepatic lobe, difficult to characterize on this noncontrast study. Prior cystectomy with right lower quadrant urostomy. Stable chronic bilateral hydronephrosis. Moderate to large stool burden throughout the colon. Stable lytic areas within the left sacrum and adjacent left iliac bone. Electronically Signed   By: Charlett Nose M.D.   On: 10/15/2023 01:40   DG Chest Port 1 View Result Date: 10/14/2023 CLINICAL DATA:  Weakness EXAM: PORTABLE CHEST 1 VIEW COMPARISON:  09/30/2023 FINDINGS: Cardiac shadow is within normal limits. Increased density is noted over the right hemithorax which is new from the prior exam. The appearance suggests a loculated effusion. CT of the chest may be helpful for further evaluation. Left lung is  clear. No acute bony abnormality is noted IMPRESSION: Increased density over the right hemithorax suggesting a loculated effusion. CT of the chest may be helpful for further evaluation. Electronically Signed   By: Alcide Clever M.D.   On: 10/14/2023 22:52   CT ABDOMEN PELVIS WO CONTRAST Result Date: 09/30/2023 CLINICAL DATA:  History of bladder cancer status post cystectomy with fever. * Tracking Code: BO * EXAM: CT ABDOMEN AND PELVIS WITHOUT CONTRAST TECHNIQUE: Multidetector CT imaging of the abdomen and pelvis was performed following the standard protocol without IV contrast. RADIATION DOSE REDUCTION: This exam was performed according to the departmental dose-optimization program which includes automated exposure control, adjustment of the mA and/or kV according to patient size and/or use of iterative reconstruction technique. COMPARISON:  CT abdomen and pelvis dated 09/17/2023 and multiple priors FINDINGS: Lower chest: Chronic right pleural effusion with adjacent medial right lower lobe rounded density, likely round atelectasis. Additional subsegmental atelectasis in the middle lobe and lingula. Irregular basilar right lower lobe nodule measuring 1.3 x 0.7 cm (5:23), increased in size from 5 x 4 mm on 07/22/2023. Unchanged right lower lobe perifissural nodules. Partially imaged heart size is normal. Coronary artery calcifications. Hepatobiliary: Ill-defined 1.8 cm hypodensity in segment 4/8 (2:13), incompletely characterized, but appears new compared to more remote studies. No intra or extrahepatic biliary ductal dilation. Gallbladder is contracted. Pancreas: No focal lesions or main ductal dilation. Spleen: Normal in size without focal abnormality. Adrenals/Urinary Tract: No adrenal nodules.  Persistent bilateral hydroureteronephrosis. Asymmetric right renal atrophy. Multifocal bilateral cysts are again seen including peripherally calcified right lower pole cysts. No renal calculi. The ureters are not well  seen in the absence of contrast. Prior cystectomy. Right lower quadrant urostomy. Stomach/Bowel: Normal appearance of the stomach. No evidence of bowel wall thickening, distention, or inflammatory changes. Moderate volume stool throughout the colon. Appendix is not discretely seen. Vascular/Lymphatic: Aortic atherosclerosis. No enlarged abdominal or pelvic lymph nodes. Reproductive: Prostate brachytherapy seeds. Other: No free fluid, fluid collection, or free air. Musculoskeletal: Similar to slightly increased lytic changes of the left sacral ala. New lytic changes involving the left iliac bone adjacent to the sacroiliac joint. Degenerative changes of the hips, left-greater-than-right. Multilevel degenerative changes of the partially imaged thoracic and lumbar spine. IMPRESSION: 1. Ill-defined 1.8 cm hypodensity in segment 4/8 of the liver, incompletely characterized, but appears new compared to more remote studies. Findings are suspicious for metastatic disease. Recommend further evaluation with contrast-enhanced MRI of the abdomen. Alternatively, attention on follow-up. 2. Similar to slightly increased lytic changes of the left sacral ala. New lytic changes involving the left iliac bone adjacent to the sacroiliac joint. 3. Irregular basilar right lower lobe nodule measuring 1.3 x 0.7 cm, increased in size from 5 x 4 mm on 07/22/2023, suspicious for metastatic disease. 4. Postsurgical change from cystectomy with right lower quadrant ostomy. Persistent bilateral hydroureteronephrosis. Ureters are not well evaluated in the absence of contrast. 5. Aortic Atherosclerosis (ICD10-I70.0). Coronary artery calcifications. Assessment for potential risk factor modification, dietary therapy or pharmacologic therapy may be warranted, if clinically indicated. Electronically Signed   By: Agustin Cree M.D.   On: 09/30/2023 20:28   DG Chest Port 1 View Result Date: 09/30/2023 CLINICAL DATA:  Fever.  Bladder cancer removed in  August. EXAM: PORTABLE CHEST 1 VIEW COMPARISON:  09/02/2023 FINDINGS: The heart size and mediastinal contours are within normal limits. Both lungs are clear. The visualized skeletal structures are unremarkable. IMPRESSION: No active disease. Electronically Signed   By: Burman Nieves M.D.   On: 09/30/2023 20:11   Labs:   Basic Metabolic Panel: Recent Labs  Lab 10/18/23 0510 10/18/23 1213 10/19/23 0759 10/20/23 0537 10/21/23 0544 10/23/23 0520  NA 135 139 139 141 140 141  K 3.9 4.0 4.5 3.5 3.5 3.2*  CL 110 111 111 109 112* 112*  CO2 16* 18* 15* 20* 19* 18*  GLUCOSE 115* 109* 114* 109* 99 117*  BUN 70* 68* 63* 57* 47* 50*  CREATININE 2.53* 2.59* 2.44* 2.29* 2.15* 1.85*  CALCIUM 9.4 9.6 10.3 10.1 10.2 10.8*  MG 2.6*  --  2.5* 2.3 2.3 2.4  PHOS  --   --   --   --  3.6  --      CBC: Recent Labs  Lab 10/18/23 0510 10/19/23 0759 10/20/23 0537 10/21/23 0544 10/23/23 0520  WBC 9.4 12.6* 11.0* 10.9* 9.8  NEUTROABS 8.0* 11.5* 9.3* 9.4* 8.2*  HGB 8.6* 10.0* 8.7* 9.0* 10.0*  HCT 27.1* 31.7* 26.4* 27.6* 31.5*  MCV 95.1 95.5 94.3 94.2 95.2  PLT 311 348 318 319 475*         SIGNED:   Calvert Cantor, MD  [facility] 10/23/2023, 2:02 PM

## 2023-10-24 DIAGNOSIS — R6520 Severe sepsis without septic shock: Secondary | ICD-10-CM | POA: Diagnosis not present

## 2023-10-24 DIAGNOSIS — A419 Sepsis, unspecified organism: Secondary | ICD-10-CM | POA: Diagnosis not present

## 2023-10-24 LAB — BASIC METABOLIC PANEL
Anion gap: 11 (ref 5–15)
BUN: 51 mg/dL — ABNORMAL HIGH (ref 8–23)
CO2: 19 mmol/L — ABNORMAL LOW (ref 22–32)
Calcium: 10.8 mg/dL — ABNORMAL HIGH (ref 8.9–10.3)
Chloride: 109 mmol/L (ref 98–111)
Creatinine, Ser: 1.53 mg/dL — ABNORMAL HIGH (ref 0.61–1.24)
GFR, Estimated: 47 mL/min — ABNORMAL LOW (ref >60)
Glucose, Bld: 116 mg/dL — ABNORMAL HIGH (ref 70–99)
Potassium: 3.8 mmol/L (ref 3.5–5.1)
Sodium: 139 mmol/L (ref 135–145)

## 2023-10-24 LAB — CBC
HCT: 31.2 % — ABNORMAL LOW (ref 39.0–52.0)
Hemoglobin: 9.7 g/dL — ABNORMAL LOW (ref 13.0–17.0)
MCH: 29.8 pg (ref 26.0–34.0)
MCHC: 31.1 g/dL (ref 30.0–36.0)
MCV: 95.7 fL (ref 80.0–100.0)
Platelets: 409 10*3/uL — ABNORMAL HIGH (ref 150–400)
RBC: 3.26 MIL/uL — ABNORMAL LOW (ref 4.22–5.81)
RDW: 16.4 % — ABNORMAL HIGH (ref 11.5–15.5)
WBC: 9.7 10*3/uL (ref 4.0–10.5)
nRBC: 0 % (ref 0.0–0.2)

## 2023-10-24 MED ORDER — OXCARBAZEPINE ER 600 MG PO TB24: 600.0000 mg | ORAL_TABLET | Freq: Every day | ORAL | Status: DC

## 2023-10-24 NOTE — Progress Notes (Signed)
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  He presented to the hospital for fever.  In the ED he was found to have a WBC count of 26 with a creatinine of 2.6.  He was found to have a right lower lobe infiltrate.  Blood and urine cultures were obtained and he was started on IV antibiotics.  He was also found to be incidentally COVID-positive. 1/7 right-sided chest tube insertion which resulted in a 15% pneumothorax Antibiotics for possible underlying pneumonia continued 1/9 pleural fibrinolysis 1/10 urology consulted for rising creatinine and new moderate right-sided hydronephrosis along with a pre-existing left-sided hydronephrosis and bilateral percutaneous nephrostomy tubes placed Oncology consulted-goals of care addressed-ultimate decision was that he is currently not a candidate for palliative chemotherapy due to his poor functional status 1/12-chest tube removed  Subjective:  No pain. Not interested in solid food. Drinking fluids and a lot of ice water. No other complaints.    Principal Problem:   Severe sepsis (HCC) - right-sided pneumonia but possibly also UTI as urine culture grew out Klebsiella and Enterococcus gallinarum on 1/6 -Pleural fluid culture was negative -He has completed 7 days of antibiotics   Active Problems: Pleural effusion- ?  Malignant -Cytology revealed atypical cells-there were squamous cells present but not enough to stain -Chest tube removed as mentioned above     Urothelial carcinoma of bladder - extensively metastatic -Extensive metastatic disease noted on imaging patient's  wife -Currently has bilateral nephrostomies in addition to ileal conduit -All are draining well -Mild hematuria noted in right nephrostomy and ileal conduit -After palliative care discussions, it has been decided that the patient remain full code  Hypercalcemia - possibly related to bone mets   AKI - Baseline creatinine about 1.6 -Creatinine rose to 2.9 on  1/8 and has been steadily improving since   Underweight-cachexia with severe fatigue Body mass index is 19.64 kg/m.   Incidentally COVID-positive on 1/6  Disposition: palliative discussions done today- I spoke with the patient and his wife. It was initially planned for him to go to rehab. After further discussions regarding poor prognosis, the patient is considering home with  hospice but his wife is uncertain if she will be able to manage him. Currently he very weak but is is ambulatory at least to the bathroom. Is not eating much solids but is drinking fluids. Dr Bertis Ruddy recommends [facility]. Will wait to see if [facility] feels he is a candidate.             Code Status: Full Code Total time on patient care: 30 min DVT prophylaxis:  heparin injection 5,000 Units Start: 10/15/23 1400 SCDs Start: 10/15/23 0857     Objective:   Vitals:   10/23/23 1410 10/23/23 1956 10/23/23 2113 10/24/23 0519  BP: (!) 145/77 (!) 157/87 (!) 157/95 (!) 157/84  Pulse: (!) 101 (!) 101  98  Resp:  18  16  Temp: 97.8 F (36.6 C) 99 F (37.2 C)  98.4 F (36.9 C)  TempSrc: Oral Oral  Oral  SpO2: 97% 95%  97%  Weight:  Height:       Filed Weights   10/14/23 2233 10/15/23 2200 10/22/23 2228  Weight: 49.7 kg 47.7 kg 51.9 kg   Exam: General exam: Appears comfortable  HEENT: oral mucosa moist Respiratory system: Clear to auscultation.  Cardiovascular system: S1 & S2 heard  Gastrointestinal system: Abdomen soft, non-tender, nondistended. Normal bowel sounds   Extremities: No cyanosis, clubbing or edema Psychiatry:  Mood &  affect appropriate.  GU: Nephrostomy tubes present and hematuria has resolved in both R nephrostomy and ileal conduit    CBC: Recent Labs  Lab 10/18/23 0510 10/19/23 0759 10/20/23 0537 10/21/23 0544 10/23/23 0520 10/24/23 0513  WBC 9.4 12.6* 11.0* 10.9* 9.8 9.7  NEUTROABS 8.0* 11.5* 9.3* 9.4* 8.2*  --   HGB 8.6* 10.0* 8.7* 9.0* 10.0* 9.7*  HCT 27.1* 31.7* 26.4* 27.6* 31.5* 31.2*  MCV 95.1 95.5 94.3 94.2 95.2 95.7  PLT 311 348 318 319 475* 409*   Basic Metabolic Panel: Recent Labs  Lab 10/18/23 0510 10/18/23 1213 10/19/23 0759 10/20/23 0537 10/21/23 0544 10/23/23 0520 10/24/23 0513  NA 135   < > 139 141 140 141 139  K 3.9   < > 4.5 3.5 3.5 3.2* 3.8  CL 110   < > 111 109 112* 112* 109  CO2 16*   < > 15* 20* 19* 18* 19*  GLUCOSE 115*   < > 114* 109* 99 117* 116*  BUN 70*   < > 63* 57* 47* 50* 51*  CREATININE 2.53*   < > 2.44* 2.29* 2.15* 1.85* 1.53*  CALCIUM 9.4   < > 10.3 10.1 10.2 10.8* 10.8*  MG 2.6*  --  2.5* 2.3 2.3 2.4  --   PHOS  --   --   --   --  3.6  --   --    < > = values in this interval not displayed.     Scheduled Meds:  amLODipine  5 mg Oral QHS   doxepin  25 mg Oral QHS   feeding supplement  237 mL Oral BID BM   fluticasone furoate-vilanterol  1 puff Inhalation Daily   folic acid  1 mg Oral Daily   heparin injection (subcutaneous)  5,000 Units Subcutaneous Q8H   levETIRAcetam  500 mg Oral BID   linaclotide  145 mcg Oral Daily   multivitamin with minerals  1 tablet Oral Daily   OXcarbazepine ER  600 mg Oral Q1500   primidone  100 mg Oral QHS   sodium chloride flush  5 mL Intracatheter Q8H    Imaging and lab data personally reviewed   Author: Calvert Cantor  10/24/2023 5:26 PM  To contact [facility]>   Check the care team in [facility] and look for the attending/consulting [facility] provider listed  Log into [email or web address] and use [place]'s universal password   Go to> "[facility]"  and find provider  If you still have difficulty  reaching the provider, please page the [facility] (Director on Call) for the Hospitalists listed on amion

## 2023-10-24 NOTE — Progress Notes (Signed)
[facility] Liaison Note  Received request from [facility] for hospice services at home after discharge. Spoke with patient and wife to initiate education related to hospice philosophy, services and team approach to care. Wife verbalized understanding of information given. Per discussion, the plan is for discharge home after DME is delivered.   DME needs discussed. Patient has the following equipment in the home:none Family requests the following equipment for delivery: hospital bed, wheelchair, [facility], walker  Please send signed and completed DNR home with patient/family. Please provide prescriptions at discharge as needed to ensure ongoing symptom management.  [facility] information and contact numbers given to Debbie. Please call with any concerns.  Thank you for the opportunity to participate in this patient's care.   Henderson Newcomer, LPN [facility] Liaison [phone]

## 2023-10-24 NOTE — Plan of Care (Signed)

## 2023-10-24 NOTE — Progress Notes (Signed)
Wesley [name]   DOB:03/18/1951   ZO#:[id number]    ASSESSMENT & PLAN:  Metastatic urothelial cancer of the bladder His imaging studies suspicious for lung metastasis, bone metastasis and peritoneal metastasis to the skin He is very frail with ECOG performance status score of 4 He is not a candidate for any kind of treatment due to his frail status The patient has desired treatment but I believe he has unrealistic expectation. With worsening hypercalcemia, this constitute oncologic emergency  The patient cannot be discharged safely due to expected worsening hypercalcemia I recommend transitioning his care to comfort measures and hospice I will return again at 3 PM today for further goals of care discussion with the patient and his wife  Worsening malignant hypercalcemia This is likely due to his active, untreated cancer Due to his poor performance status and poor oral intake, he is not a candidate for treatment We discussed risk of organ failure and sudden death with worsening hypercalcemia  Renal failure, improving   Multifactorial anemia There is a component of iron deficiency, folate deficiency and anemia chronic renal failure Recommend transfusion support as needed Recommend folic acid supplement   Severe protein calorie malnutrition, cachexia He ate very little despite his best effort  His nutritional intake and estimated calorie to be less than at 1000 kcal/day  severe constipation He will continue aggressive laxatives   Discharge planning  unknown   Goals of care discussion I will initiate another goals of care discussion with the patient and family at 3 PM today I recommend against discharge until this is accomplished as the patient will likely be readmitted with organ failure from untreated cancer and worsening hypercalcemia  All questions were answered. The patient knows to call the clinic with any problems, questions or concerns.   The total time spent in the  appointment was 55 minutes encounter with patients including review of chart and various tests results, discussions about plan of care and coordination of care plan  Artis Delay, MD 10/24/2023 9:43 AM  Subjective:  Patient is seen in the room.  He appears to be alert.  His wife is on the phone.  I reviewed plan of care with the patient.  He did not remember our conversations from the last 2 visits.  His oral intake remain poor. We discussed test results and findings of worsening hypercalcemia  Objective:  Vitals:   10/23/23 2113 10/24/23 0519  BP: (!) 157/95 (!) 157/84  Pulse:  98  Resp:  16  Temp:  98.4 F (36.9 C)  SpO2:  97%     Intake/Output Summary (Last 24 hours) at 10/24/2023 0943 Last data filed at 10/24/2023 0525 Gross per 24 hour  Intake 384 ml  Output 1025 ml  Net -641 ml    GENERAL:alert, no distress and comfortable NEURO: alert & oriented x 3 with fluent speech, no focal motor/sensory deficits   Labs:  Recent Labs    10/20/23 0537 10/21/23 0544 10/23/23 0520 10/24/23 0513  NA 141 140 141 139  K 3.5 3.5 3.2* 3.8  CL 109 112* 112* 109  CO2 20* 19* 18* 19*  GLUCOSE 109* 99 117* 116*  BUN 57* 47* 50* 51*  CREATININE 2.29* 2.15* 1.85* 1.53*  CALCIUM 10.1 10.2 10.8* 10.8*  GFRNONAA 29* 32* 38* 47*  PROT 6.0* 5.9* 6.1*  --   ALBUMIN 1.9* 2.0* 2.1*  --   AST 20 23 30   --   ALT 16 22 27   --   ALKPHOS 105  120 145*  --   BILITOT 0.4 0.3 0.2  --     Studies:  DG CHEST PORT 1 VIEW Result Date: 10/22/2023 CLINICAL DATA:  Malignant pleural effusion. EXAM: PORTABLE CHEST 1 VIEW COMPARISON:  October 21, 2023. FINDINGS: The heart size and mediastinal contours are within normal limits. Left lung is clear. Elevated right hemidiaphragm is noted. Minimal right basilar subsegmental atelectasis or scarring is noted with probable small pleural effusion. The visualized skeletal structures are unremarkable. IMPRESSION: Elevated right hemidiaphragm. Minimal right basilar  subsegmental atelectasis or scarring is noted probable small pleural effusion. Electronically Signed   By: Lupita Raider M.D.   On: 10/22/2023 10:49   DG CHEST PORT 1 VIEW Result Date: 10/21/2023 CLINICAL DATA:  Pleural effusion EXAM: PORTABLE CHEST 1 VIEW COMPARISON:  10/18/2023 FINDINGS: Interval removal of right basilar chest tube. Large right pleural effusion with right lower lobe atelectasis or infiltrate. No pneumothorax. Left lung clear. Heart and mediastinal contours within normal limits. IMPRESSION: Interval removal of right chest tube with residual large right pleural effusion. No pneumothorax. Right lower lobe atelectasis or infiltrate. Electronically Signed   By: Charlett Nose M.D.   On: 10/21/2023 17:41   IR NEPHROSTOMY PLACEMENT RIGHT Result Date: 10/18/2023 CLINICAL DATA:  History of bladder carcinoma with cystectomy and ileal conduit. Bilateral hydronephrosis and need for nephrostomy tube diversion. EXAM: 1. ULTRASOUND GUIDANCE FOR PUNCTURE OF THE LEFT RENAL COLLECTING SYSTEM 2. LEFT PERCUTANEOUS NEPHROSTOMY TUBE PLACEMENT 3. ULTRASOUND GUIDANCE FOR PUNCTURE OF THE RIGHT RENAL COLLECTING SYSTEM 4. RIGHT PERCUTANEOUS NEPHROSTOMY TUBE PLACEMENT COMPARISON:  Renal ultrasound on 10/17/2023 ANESTHESIA/SEDATION: Moderate (conscious) sedation was employed during this procedure. A total of Versed 1.5 mg and Fentanyl 75 mcg was administered intravenously. Moderate Sedation Time: 28 minutes. The patient's level of consciousness and vital signs were monitored continuously by radiology nursing throughout the procedure under my direct supervision. CONTRAST:  25 mL Omnipaque 300 MEDICATIONS: No additional medications. The patient is on scheduled IV Zosyn and did not receive additional antibiotics. FLUOROSCOPY TIME:  2 minutes and 42 seconds.  1.0 mGy. PROCEDURE: The procedure, risks, benefits, and alternatives were explained to the patient. Questions regarding the procedure were encouraged and answered. The  patient understands and consents to the procedure. A time-out was performed prior to the procedure. Both flank regions were prepped with chlorhexidine in a sterile fashion, and a sterile drape was applied covering the operative field. A sterile gown and sterile gloves were used for the procedure. Local anesthesia was provided with 1% Lidocaine. Ultrasound was used to localize both kidneys. Under direct ultrasound guidance, 21 gauge needles were advanced into the renal collecting systems. Ultrasound image documentation was performed. Aspiration of urine sample was performed followed by contrast injection. Transitional dilators were advanced over guidewires. Percutaneous tract dilatation was then performed over the guidewire. Ten French bilateral percutaneous nephrostomy tubes were then advanced and formed in the collecting systems. Catheter position was confirmed by fluoroscopy after contrast injection. Both catheters were attached to gravity bag drainage and secured at skin exit sites with Prolene retention sutures and StatLock devices. COMPLICATIONS: None. FINDINGS: After left nephrostomy tube placement, urine return was initially bloody/blood tinged. The tube was formed in the renal pelvis. 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 The nephrostomy tube was partially formed in the upper pole with extension into the region of the renal pelvis. Urine return is clear and slightly turbid. IMPRESSION: Bilateral nephrostomy tube placement. Ten [place]  bilateral nephrostomy tubes were placed and attached to gravity bag drainage. Electronically Signed   By: Irish Lack M.D.   On: 10/18/2023 17:34   IR NEPHROSTOMY PLACEMENT LEFT Result Date: 10/18/2023 CLINICAL DATA:  History of bladder carcinoma with cystectomy and ileal conduit. Bilateral hydronephrosis and need for nephrostomy tube diversion. EXAM: 1. ULTRASOUND GUIDANCE FOR PUNCTURE OF THE LEFT  RENAL COLLECTING SYSTEM 2. LEFT PERCUTANEOUS NEPHROSTOMY TUBE PLACEMENT 3. ULTRASOUND GUIDANCE FOR PUNCTURE OF THE RIGHT RENAL COLLECTING SYSTEM 4. RIGHT PERCUTANEOUS NEPHROSTOMY TUBE PLACEMENT COMPARISON:  Renal ultrasound on 10/17/2023 ANESTHESIA/SEDATION: Moderate (conscious) sedation was employed during this procedure. A total of Versed 1.5 mg and Fentanyl 75 mcg was administered intravenously. Moderate Sedation Time: 28 minutes. The patient's level of consciousness and vital signs were monitored continuously by radiology nursing throughout the procedure under my direct supervision. CONTRAST:  25 mL Omnipaque 300 MEDICATIONS: No additional medications. The patient is on scheduled IV Zosyn and did not receive additional antibiotics. FLUOROSCOPY TIME:  2 minutes and 42 seconds.  1.0 mGy. PROCEDURE: The procedure, risks, benefits, and alternatives were explained to the patient. Questions regarding the procedure were encouraged and answered. The patient understands and consents to the procedure. A time-out was performed prior to the procedure. Both flank regions were prepped with chlorhexidine in a sterile fashion, and a sterile drape was applied covering the operative field. A sterile gown and sterile gloves were used for the procedure. Local anesthesia was provided with 1% Lidocaine. Ultrasound was used to localize both kidneys. Under direct ultrasound guidance, 21 gauge needles were advanced into the renal collecting systems. Ultrasound image documentation was performed. Aspiration of urine sample was performed followed by contrast injection. Transitional dilators were advanced over guidewires. Percutaneous tract dilatation was then performed over the guidewire. Ten French bilateral percutaneous nephrostomy tubes were then advanced and formed in the collecting systems. Catheter position was confirmed by fluoroscopy after contrast injection. Both catheters were attached to gravity bag drainage and secured at skin  exit sites with Prolene retention sutures and StatLock devices. COMPLICATIONS: None. FINDINGS: After left nephrostomy tube placement, urine return was initially bloody/blood tinged. The tube was formed in the renal pelvis. 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 The nephrostomy tube was partially formed in the upper pole with extension into the region of the renal pelvis. Urine return is clear and slightly turbid. IMPRESSION: Bilateral nephrostomy tube placement. Ten French bilateral nephrostomy tubes were placed and attached to gravity bag drainage. Electronically Signed   By: Irish Lack M.D.   On: 10/18/2023 17:34   DG CHEST PORT 1 VIEW Result Date: 10/18/2023 CLINICAL DATA:  [id number] with pleural effusion and chest tube in place. EXAM: PORTABLE CHEST 1 VIEW COMPARISON:  Portable chest yesterday at 5:17 a.m. FINDINGS: 4:58 a.m. pigtail right chest tube remains with the pigtail superimposing in the lateral lower chest alongside the elevated hemidiaphragm. There is no measurable pneumothorax. Small right pleural effusion has reaccumulated with adjacent hazy atelectasis or consolidation. The left lung and right upper lung fields are clear. The cardiomediastinal silhouette is stable. There is aortic atherosclerosis. No vascular congestion is seen. No new osseous findings. Thoracic spondylosis and DDD. IMPRESSION: 1. Small right pleural effusion has reaccumulated with adjacent hazy atelectasis or consolidation. 2. Right chest tube remains in place. No measurable pneumothorax. 3. Aortic atherosclerosis. Electronically Signed   By: Almira Bar M.D.   On: 10/18/2023 06:57   DG CHEST PORT 1 VIEW  Result Date: 10/17/2023 CLINICAL DATA:  Status post right chest tube placement for loculated right pleural effusion. EXAM: PORTABLE CHEST 1 VIEW COMPARISON:  10/16/2023 FINDINGS: The heart size and mediastinal contours are within normal limits. Stable  positioning right pleural drainage catheter. No significant residual pleural fluid. Stable mild atelectasis at the right lung base with elevation of the right hemidiaphragm. No pneumothorax or pulmonary edema. The visualized skeletal structures are unremarkable. IMPRESSION: Stable positioning of right pleural drainage catheter. No significant residual pleural fluid. Stable mild atelectasis at the right lung base with elevation of the right hemidiaphragm. Electronically Signed   By: Irish Lack M.D.   On: 10/17/2023 09:28   US RENAL Result Date: 10/17/2023 CLINICAL DATA:  [id number]. Renal failure. Prior cystectomy with right lower quadrant urostomy. Surgery date 05/22/2023. EXAM: RENAL / URINARY TRACT ULTRASOUND COMPLETE COMPARISON:  CT without contrast 10/15/2023, renal ultrasound 07/24/2023 FINDINGS: Right Kidney: Renal measurements: Atrophic measuring 5.2 x 5.0 x 4.1 cm = volume: 55.9 mL, previously 69.6 mL. The cortex is thin and echogenic as before. There is moderate hydronephrosis which was seen on the CT yesterday but was not seen on the ultrasound from 07/24/2023. 2 cm anechoic cyst in the upper pole is again shown. No mass is evident and no visible stones. Left Kidney: Renal measurements: 10.0 x 7.0 x 7.4 cm = volume: 275.8 mL. The renal cortex again is mildly echogenic but no more than previously. Severe hydronephrosis is again noted and was present on both prior studies. There is hypoechoic debris within some of the dilated calices which could be infectious or hemorrhagic debris. Correlate with urinalysis. There are multiple anechoic cysts, largest is 6.7 cm, as before. No complex cyst is seen. No visible stones. Bladder: Surgically absent. Other: None. IMPRESSION: 1. Moderate right hydronephrosis which was seen on the CT yesterday but was not seen on the ultrasound from 07/24/2023. 2. Severe left hydronephrosis which was present on both prior studies. There is hypoechoic debris within some of the  dilated calices which could be infectious or hemorrhagic debris. Correlate with urinalysis. 3. Atrophic right kidney with thin and echogenic cortex as before. Mild but unchanged increased echogenicity of the left kidney. 4. Bilateral renal cysts. 5. Prior cystectomy with right lower quadrant urostomy. Electronically Signed   By: Almira Bar M.D.   On: 10/17/2023 02:14   DG Chest Port 1 View Result Date: 10/16/2023 CLINICAL DATA:  Pleural effusion. EXAM: PORTABLE CHEST 1 VIEW COMPARISON:  October 15, 2023. FINDINGS: Right-sided chest tube is again noted.  No pneumothorax is noted. IMPRESSION: No definite pneumothorax seen currently. Right-sided chest tube is again noted. Electronically Signed   By: Lupita Raider M.D.   On: 10/16/2023 09:18   DG Chest Port 1 View Result Date: 10/15/2023 CLINICAL DATA:  Thoracostomy placement EXAM: PORTABLE CHEST 1 VIEW COMPARISON:  10/14/2023 FINDINGS: Chest tube in place on the right in the inferior pleural space. Moderate right pneumothorax, estimated at 30%. No tension. Volume loss persists at the left lung base. IMPRESSION: Chest tube in place on the right. Moderate right pneumothorax, estimated at 30%. No tension. Electronically Signed   By: Paulina Fusi M.D.   On: 10/15/2023 14:51   CT CHEST ABDOMEN PELVIS WO CONTRAST Result Date: 10/15/2023 CLINICAL DATA:  Set CIS, elevated white count. EXAM: CT CHEST, ABDOMEN AND PELVIS WITHOUT CONTRAST TECHNIQUE: Multidetector CT imaging of the chest, abdomen and pelvis was performed following the standard protocol without IV contrast. RADIATION DOSE REDUCTION: This exam was performed  according to the departmental dose-optimization program which includes automated exposure control, adjustment of the mA and/or kV according to patient size and/or use of iterative reconstruction technique. COMPARISON:  CT abdomen and pelvis 09/30/2023.  Chest CT 07/19/2023 FINDINGS: CT CHEST FINDINGS Cardiovascular: Heart is normal size. Aorta is  normal caliber. Diffuse 3 vessel coronary artery disease. Scattered aortic atherosclerosis. Mediastinum/Nodes: No mediastinal, hilar, or axillary adenopathy. Trachea and esophagus are unremarkable. Thyroid unremarkable. Lungs/Pleura: Loculated moderate right pleural effusion, increasing since prior chest CT. Airspace opacity in the right lower lobe and right middle lobe could reflect atelectasis or pneumonia. Predominantly linear opacities in the lingula and left lower lobe could reflect scarring or atelectasis. Subpleural nodularity noted along the left fissure and posteriorly. Left upper lobe nodule on image 37 measures 8 mm, stable since prior study. Left upper lobe nodule on image 36 measures 4 mm, new since prior study. Musculoskeletal: Chest wall soft tissues are unremarkable. No acute bony abnormality. CT ABDOMEN PELVIS FINDINGS Hepatobiliary: 1.9 cm low-density lesion in the right hepatic lobe. 1.8 cm previously, difficult to characterize on this noncontrast study. Gallbladder unremarkable. No biliary ductal dilatation. Pancreas: No focal abnormality or ductal dilatation. Spleen: No focal abnormality.  Normal size. Adrenals/Urinary Tract: Adrenal glands unremarkable. Persistent bilateral hydronephrosis, unchanged since prior study. Nonobstructing stones in the lower pole of the right kidney. Right kidney is atrophic. Multiple bilateral renal cysts are unchanged. Prior cystectomy with right lower quadrant urostomy. Ureters are difficult to follow without contrast. Stomach/Bowel: Moderate to large stool burden throughout the colon, similar to prior study. No bowel obstruction. Vascular/Lymphatic: Aortoiliac atherosclerosis. No evidence of aneurysm or adenopathy. 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 Other: No free fluid or free air. Musculoskeletal: Again noted is the lytic area within the left sacral ala and adjacent left iliac bone, unchanged since recent study. Degenerative disc and  facet disease in the lumbar spine. IMPRESSION: Enlarging chronic loculated right pleural effusion and increasing right lower lobe airspace disease. This could reflect atelectasis or pneumonia. Nodularity noted along the left fissure and subpleural locations, increasing since prior study. Cannot exclude metastases. New 4 mm left upper lobe pulmonary nodule. Recommend attention on follow-up imaging. Diffuse coronary artery disease.  Aortic atherosclerosis. 1.9 cm low-density lesion in the right hepatic lobe, difficult to characterize on this noncontrast study. Prior cystectomy with right lower quadrant urostomy. Stable chronic bilateral hydronephrosis. Moderate to large stool burden throughout the colon. Stable lytic areas within the left sacrum and adjacent left iliac bone. Electronically Signed   By: Charlett Nose M.D.   On: 10/15/2023 01:40   DG Chest Port 1 View Result Date: 10/14/2023 CLINICAL DATA:  Weakness EXAM: PORTABLE CHEST 1 VIEW COMPARISON:  09/30/2023 FINDINGS: Cardiac shadow is within normal limits. Increased density is noted over the right hemithorax which is new from the prior exam. The appearance suggests a loculated effusion. CT of the chest may be helpful for further evaluation. Left lung is clear. No acute bony abnormality is noted IMPRESSION: Increased density over the right hemithorax suggesting a loculated effusion. CT of the chest may be helpful for further evaluation. Electronically Signed   By: Alcide Clever M.D.   On: 10/14/2023 22:52   CT ABDOMEN PELVIS WO CONTRAST Result Date: 09/30/2023 CLINICAL DATA:  History of bladder cancer status post cystectomy with fever. * Tracking Code: BO * EXAM: CT ABDOMEN AND PELVIS WITHOUT CONTRAST TECHNIQUE: Multidetector CT imaging of the abdomen and pelvis was performed following the standard protocol without IV  contrast. RADIATION DOSE REDUCTION: This exam was performed according to the departmental dose-optimization program which includes automated  exposure control, adjustment of the mA and/or kV according to patient size and/or use of iterative reconstruction technique. COMPARISON:  CT abdomen and pelvis dated 09/17/2023 and multiple priors FINDINGS: Lower chest: Chronic right pleural effusion with adjacent medial right lower lobe rounded density, likely round atelectasis. Additional subsegmental atelectasis in the middle lobe and lingula. Irregular basilar right lower lobe nodule measuring 1.3 x 0.7 cm (5:23), increased in size from 5 x 4 mm on 07/22/2023. Unchanged right lower lobe perifissural nodules. Partially imaged heart size is normal. Coronary artery calcifications. Hepatobiliary: Ill-defined 1.8 cm hypodensity in segment 4/8 (2:13), incompletely characterized, but appears new compared to more remote studies. No intra or extrahepatic biliary ductal dilation. Gallbladder is contracted. Pancreas: No focal lesions or main ductal dilation. Spleen: Normal in size without focal abnormality. Adrenals/Urinary Tract: No adrenal nodules. Persistent bilateral hydroureteronephrosis. Asymmetric right renal atrophy. Multifocal bilateral cysts are again seen including peripherally calcified right lower pole cysts. No renal calculi. The ureters are not well seen in the absence of contrast. Prior cystectomy. Right lower quadrant urostomy. Stomach/Bowel: Normal appearance of the stomach. No evidence of bowel wall thickening, distention, or inflammatory changes. Moderate volume stool throughout the colon. Appendix is not discretely seen. Vascular/Lymphatic: Aortic atherosclerosis. No enlarged abdominal or pelvic lymph nodes. Reproductive: Prostate brachytherapy seeds. Other: No free fluid, fluid collection, or free air. Musculoskeletal: Similar to slightly increased lytic changes of the left sacral ala. New lytic changes involving the left iliac bone adjacent to the sacroiliac joint. Degenerative changes of the hips, left-greater-than-right. Multilevel degenerative  changes of the partially imaged thoracic and lumbar spine. IMPRESSION: 1. Ill-defined 1.8 cm hypodensity in segment 4/8 of the liver, incompletely characterized, but appears new compared to more remote studies. Findings are suspicious for metastatic disease. Recommend further evaluation with contrast-enhanced MRI of the abdomen. Alternatively, attention on follow-up. 2. Similar to slightly increased lytic changes of the left sacral ala. New lytic changes involving the left iliac bone adjacent to the sacroiliac joint. 3. Irregular basilar right lower lobe nodule measuring 1.3 x 0.7 cm, increased in size from 5 x 4 mm on 07/22/2023, suspicious for metastatic disease. 4. Postsurgical change from cystectomy with right lower quadrant ostomy. Persistent bilateral hydroureteronephrosis. Ureters are not well evaluated in the absence of contrast. 5. Aortic Atherosclerosis (ICD10-I70.0). Coronary artery calcifications. Assessment for potential risk factor modification, dietary therapy or pharmacologic therapy may be warranted, if clinically indicated. Electronically Signed   By: Agustin Cree M.D.   On: 09/30/2023 20:28   DG Chest Port 1 View Result Date: 09/30/2023 CLINICAL DATA:  Fever.  Bladder cancer removed in August. EXAM: PORTABLE CHEST 1 VIEW COMPARISON:  09/02/2023 FINDINGS: The heart size and mediastinal contours are within normal limits. Both lungs are clear. The visualized skeletal structures are unremarkable. IMPRESSION: No active disease. Electronically Signed   By: Burman Nieves M.D.   On: 09/30/2023 20:11

## 2023-10-24 NOTE — Plan of Care (Signed)
  Problem: Respiratory: Goal: Will maintain a patent airway Outcome: Progressing   Problem: Respiratory: Goal: Complications related to the disease process, condition or treatment will be avoided or minimized Outcome: Progressing   Problem: Education: Goal: Knowledge of General Education information will improve Description: Including pain rating scale, medication(s)/side effects and non-pharmacologic comfort measures Outcome: Progressing   Problem: Clinical Measurements: Goal: Respiratory complications will improve Outcome: Progressing   Problem: Pain Management: Goal: General experience of comfort will improve Outcome: Progressing   Problem: Safety: Goal: Ability to remain free from injury will improve Outcome: Progressing   Problem: Skin Integrity: Goal: Risk for impaired skin integrity will decrease Outcome: Progressing

## 2023-10-24 NOTE — Progress Notes (Signed)
I have another goals of care discussion with the patient in the presence of his wife [name] and his son, Harrison Mons I reviewed current clinical findings We discussed the risks of untreated hypercalcemia and associated organ failure. With his poor performance status, he is not a candidate for systemic treatment We discussed the role of palliative care with hospice We discussed the role of comfort measures  I was informed that the family has planned to take him home tomorrow with home hospice enrollment.  When I discussed the logistics with his wife and his son, it does not appear that they are capable of taking care of him at home as the patient needs a lot of care.  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  It is not likely that the patient will be able to get weaned off from a ventilator if he gets intubated with his untreated state.  The patient is noticeable become ambivalent to accept DO NOT RESUSCITATE CODE STATUS.  If the patient decline transitioning his care to comfort measures and except DNR, it is not clear to me that the patient would be a candidate to be accepted to residential hospice facility  I addressed all their questions.  I will return tomorrow morning for further discussion about goals of care.  The primary service as well as transitioning of care team are informed  Overall, I spent about 45 minutes with the patient and family

## 2023-10-24 NOTE — TOC Progression Note (Addendum)
Transition of Care [facility]) - Progression Note    Patient Details  Name: Wesley [name] MRN: [id number] Date of Birth: 12/14/1950  Transition [facility]) CM/SW Contact  Danyell Awbrey, Olegario Messier, RN Phone Number: 10/24/2023, 10:11 AM  Clinical Narrative:  Noted change in medical condition. I will follow & await referral since plans may change from [facility]. MD updated. -11:13a-spoke to Debbie(spouse) about d/c plans-she has spoken to Dr. Eula Listen choose to d/c home w/hospice-[facility] services chosen-rep Efraim Kaufmann will discuss services. Dr. Bertis Ruddy agreed to home hospice referral.Debbie also preferred discussions about code status. MD updated. -2:46p [facility] care rep Melissa accepted for home w/hospice,dme.Will f/u w/Debbie if transportation needed can arrange once dme is in the home. -4p-Debbie still having ongoing discussions w/oncologist about d/c plans. [facility] rep dme cancelled.     Expected Discharge Plan:  (TBD) Barriers to Discharge: Continued Medical Work up  Expected Discharge Plan and Services         Expected Discharge Date: 10/23/23                                     Social Determinants of Health (SDOH) Interventions SDOH Screenings   Food Insecurity: No Food Insecurity (10/15/2023)  Housing: Low Risk  (10/15/2023)  Transportation Needs: No Transportation Needs (10/15/2023)  Utilities: Not At Risk (10/15/2023)  Alcohol Screen: Low Risk  (02/26/2023)  Depression (PHQ2-9): Low Risk  (02/26/2023)  Financial Resource Strain: Low Risk  (09/26/2023)   Received from [facility]  Physical Activity: Insufficiently Active (09/26/2023)   Received from [facility]  Social Connections: Socially Integrated (10/15/2023)  Stress: No Stress Concern Present (09/26/2023)   Received from [facility]  Tobacco Use: Medium Risk (10/15/2023)    Readmission Risk Interventions    10/16/2023    3:44 PM 05/23/2023   10:18 AM  Readmission Risk Prevention Plan  Post Dischage Appt   Complete  Medication Screening  Complete  Transportation Screening Complete Complete  PCP or Specialist Appt within 3-5 Days Complete   HRI or Home Care Consult Complete   Social Work Consult for Recovery Care Planning/Counseling Complete   Palliative Care Screening Not Applicable   Medication Review Ocean[employer]) Complete

## 2023-10-25 DIAGNOSIS — A419 Sepsis, unspecified organism: Secondary | ICD-10-CM | POA: Diagnosis not present

## 2023-10-25 DIAGNOSIS — R6520 Severe sepsis without septic shock: Secondary | ICD-10-CM | POA: Diagnosis not present

## 2023-10-25 MED ORDER — SODIUM CHLORIDE 0.9 % IV SOLN: INTRAVENOUS | Status: DC

## 2023-10-25 MED ORDER — LEVETIRACETAM 500 MG PO TABS
500.0000 mg | ORAL_TABLET | Freq: Three times a day (TID) | ORAL | Status: DC
  Administered 2023-10-25 – 2023-10-26 (×3): 500 mg via ORAL
  Filled 2023-10-25 (×3): qty 1

## 2023-10-25 MED ORDER — OXCARBAZEPINE ER 600 MG PO TB24
600.0000 mg | ORAL_TABLET | Freq: Every day | ORAL | Status: DC
Start: 2023-10-26 — End: 2023-10-26

## 2023-10-25 NOTE — Plan of Care (Signed)
  Problem: Education: Goal: Knowledge of risk factors and measures for prevention of condition will improve Outcome: Progressing   Problem: Coping: Goal: Psychosocial and spiritual needs will be supported Outcome: Progressing   Problem: Health Behavior/Discharge Planning: Goal: Ability to manage health-related needs will improve Outcome: Progressing

## 2023-10-25 NOTE — Plan of Care (Signed)
  Problem: Education: Goal: Knowledge of General Education information will improve Description: Including pain rating scale, medication(s)/side effects and non-pharmacologic comfort measures Outcome: Progressing   Problem: Clinical Measurements: Goal: Respiratory complications will improve Outcome: Progressing   Problem: Coping: Goal: Level of anxiety will decrease Outcome: Progressing   Problem: Pain Management: Goal: General experience of comfort will improve Outcome: Progressing

## 2023-10-25 NOTE — Progress Notes (Addendum)
[facility] Liaison Note  Noted Dr. Bertis Ruddy note. Thank you, Dr Bertis Ruddy.  [facility] will continue to follow along peripherally for continued goals of care conversations and discharge disposition.  1501 Addendum: Received message from Lanier Clam, RN with [facility] and Dr. Butler Denmark regarding patient ready for DC home with hospice. Stopped by patients room and patient requests I speak with wife. She has just left the hospital. I have called and left a message for her to return my call to discuss hospice services and DME needs.  1630: Met with wife and patient at bedside. DME to be delivered tonight and plan for discharge by [facility] tomorrow morning.  Please call with any hospice related questions or concerns. We are here to support in any way we can.  Thank you, Haynes Bast, BSN, [facility] [phone]

## 2023-10-25 NOTE — Progress Notes (Signed)
Wesley [name]   DOB:[birth year]   UJ#:[id number]    ASSESSMENT & PLAN:  Metastatic urothelial cancer of the bladder His imaging studies suspicious for lung metastasis, bone metastasis and peritoneal metastasis to the skin He is very frail with ECOG performance status score of 4 He is not a candidate for any kind of treatment due to his frail status The patient has desired treatment but I believe he has unrealistic expectation. With worsening hypercalcemia, this constitute oncologic emergency  The patient cannot be discharged safely due to expected worsening hypercalcemia, unless he agrees to transition his care to comfort measures and hospice Even though home hospice has been arranged, his wife is not sure she can take care of him at home.   Worsening malignant hypercalcemia This is likely due to his active, untreated cancer Due to his poor performance status and poor oral intake, he is not a candidate for treatment We discussed risk of organ failure and sudden death with worsening hypercalcemia His oral intake is not adequate.  Will put him on gentle IV fluid hydration until further plan of care can be arranged   Renal failure, improving    Multifactorial anemia There is a component of iron deficiency, folate deficiency and anemia chronic renal failure Recommend transfusion support as needed Recommend folic acid supplement   Severe protein calorie malnutrition, cachexia He ate very little despite his best effort  His nutritional intake and estimated calorie to be less than at 1000 kcal/day   severe constipation He will continue aggressive laxatives   Discharge planning  unknown   Goals of care discussion I have lengthy goals of care discussion with the patient and family.  The patient continues to have unrealistic expectation.  He refused to discuss goals of care or CODE STATUS today. I am unable to get hold of his wife.   His wife and family has arranged for him to be  discharged to home hospice but it is clear to me that she is not capable of taking care of him and that she can arrange additional help.  I would defer further goals of care discussion to the primary service.  All questions were answered. The patient knows to call the clinic with any problems, questions or concerns.   The total time spent in the appointment was 30 minutes encounter with patients including review of chart and various tests results, discussions about plan of care and coordination of care plan  Artis Delay, MD 10/25/2023 8:31 AM  Subjective:  He denies pain.  He ate a little breakfast, could not recall what he ate for dinner.  He thought his wife is in the room but she is not.  He declines further discussion about CODE STATUS or goals of care, still undecided and told me that he is still in the process of thinking  Objective:  Vitals:   10/25/23 0817 10/25/23 0819  BP:    Pulse:    Resp:    Temp:    SpO2: 98% 98%     Intake/Output Summary (Last 24 hours) at 10/25/2023 0831 Last data filed at 10/25/2023 0531 Gross per 24 hour  Intake 395 ml  Output 2125 ml  Net -1730 ml    GENERAL:alert, no distress and comfortable NEURO: He appears a little confused, thinking his wife is in the room.  He appears forgetful and cannot recall some of the conversation yesterday or recall what he ate last night for dinner   Labs:  Recent Labs  10/20/23 0537 10/21/23 0544 10/23/23 0520 10/24/23 0513  NA 141 140 141 139  K 3.5 3.5 3.2* 3.8  CL 109 112* 112* 109  CO2 20* 19* 18* 19*  GLUCOSE 109* 99 117* 116*  BUN 57* 47* 50* 51*  CREATININE 2.29* 2.15* 1.85* 1.53*  CALCIUM 10.1 10.2 10.8* 10.8*  GFRNONAA 29* 32* 38* 47*  PROT 6.0* 5.9* 6.1*  --   ALBUMIN 1.9* 2.0* 2.1*  --   AST 20 23 30   --   ALT 16 22 27   --   ALKPHOS 105 120 145*  --   BILITOT 0.4 0.3 0.2  --     Studies:  DG CHEST PORT 1 VIEW Result Date: 10/22/2023 CLINICAL DATA:  Malignant pleural effusion. EXAM:  PORTABLE CHEST 1 VIEW COMPARISON:  October 21, 2023. FINDINGS: The heart size and mediastinal contours are within normal limits. Left lung is clear. Elevated right hemidiaphragm is noted. Minimal right basilar subsegmental atelectasis or scarring is noted with probable small pleural effusion. The visualized skeletal structures are unremarkable. IMPRESSION: Elevated right hemidiaphragm. Minimal right basilar subsegmental atelectasis or scarring is noted probable small pleural effusion. Electronically Signed   By: Lupita Raider M.D.   On: 10/22/2023 10:49   DG CHEST PORT 1 VIEW Result Date: 10/21/2023 CLINICAL DATA:  Pleural effusion EXAM: PORTABLE CHEST 1 VIEW COMPARISON:  10/18/2023 FINDINGS: Interval removal of right basilar chest tube. Large right pleural effusion with right lower lobe atelectasis or infiltrate. No pneumothorax. Left lung clear. Heart and mediastinal contours within normal limits. IMPRESSION: Interval removal of right chest tube with residual large right pleural effusion. No pneumothorax. Right lower lobe atelectasis or infiltrate. Electronically Signed   By: Charlett Nose M.D.   On: 10/21/2023 17:41   IR NEPHROSTOMY PLACEMENT RIGHT Result Date: 10/18/2023 CLINICAL DATA:  History of bladder carcinoma with cystectomy and ileal conduit. Bilateral hydronephrosis and need for nephrostomy tube diversion. EXAM: 1. ULTRASOUND GUIDANCE FOR PUNCTURE OF THE LEFT RENAL COLLECTING SYSTEM 2. LEFT PERCUTANEOUS NEPHROSTOMY TUBE PLACEMENT 3. ULTRASOUND GUIDANCE FOR PUNCTURE OF THE RIGHT RENAL COLLECTING SYSTEM 4. RIGHT PERCUTANEOUS NEPHROSTOMY TUBE PLACEMENT COMPARISON:  Renal ultrasound on 10/17/2023 ANESTHESIA/SEDATION: Moderate (conscious) sedation was employed during this procedure. A total of Versed 1.5 mg and Fentanyl 75 mcg was administered intravenously. Moderate Sedation Time: 28 minutes. The patient's level of consciousness and vital signs were monitored continuously by radiology nursing  throughout the procedure under my direct supervision. CONTRAST:  25 mL Omnipaque 300 MEDICATIONS: No additional medications. The patient is on scheduled IV Zosyn and did not receive additional antibiotics. FLUOROSCOPY TIME:  2 minutes and 42 seconds.  1.0 mGy. PROCEDURE: The procedure, risks, benefits, and alternatives were explained to the patient. Questions regarding the procedure were encouraged and answered. The patient understands and consents to the procedure. A time-out was performed prior to the procedure. Both flank regions were prepped with chlorhexidine in a sterile fashion, and a sterile drape was applied covering the operative field. A sterile gown and sterile gloves were used for the procedure. Local anesthesia was provided with 1% Lidocaine. Ultrasound was used to localize both kidneys. Under direct ultrasound guidance, 21 gauge needles were advanced into the renal collecting systems. Ultrasound image documentation was performed. Aspiration of urine sample was performed followed by contrast injection. Transitional dilators were advanced over guidewires. Percutaneous tract dilatation was then performed over the guidewire. Ten French bilateral percutaneous nephrostomy tubes were then advanced and formed in the collecting systems. Catheter position was confirmed by  fluoroscopy after contrast injection. Both catheters were attached to gravity bag drainage and secured at skin exit sites with Prolene retention sutures and StatLock devices. COMPLICATIONS: None. FINDINGS: After left nephrostomy tube placement, urine return was initially bloody/blood tinged. The tube was formed in the renal pelvis. 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 The nephrostomy tube was partially formed in the upper pole with extension into the region of the renal pelvis. Urine return is clear and slightly turbid. IMPRESSION: Bilateral nephrostomy tube placement. Ten  French bilateral nephrostomy tubes were placed and attached to gravity bag drainage. Electronically Signed   By: Irish Lack M.D.   On: 10/18/2023 17:34   IR NEPHROSTOMY PLACEMENT LEFT Result Date: 10/18/2023 CLINICAL DATA:  History of bladder carcinoma with cystectomy and ileal conduit. Bilateral hydronephrosis and need for nephrostomy tube diversion. EXAM: 1. ULTRASOUND GUIDANCE FOR PUNCTURE OF THE LEFT RENAL COLLECTING SYSTEM 2. LEFT PERCUTANEOUS NEPHROSTOMY TUBE PLACEMENT 3. ULTRASOUND GUIDANCE FOR PUNCTURE OF THE RIGHT RENAL COLLECTING SYSTEM 4. RIGHT PERCUTANEOUS NEPHROSTOMY TUBE PLACEMENT COMPARISON:  Renal ultrasound on 10/17/2023 ANESTHESIA/SEDATION: Moderate (conscious) sedation was employed during this procedure. A total of Versed 1.5 mg and Fentanyl 75 mcg was administered intravenously. Moderate Sedation Time: 28 minutes. The patient's level of consciousness and vital signs were monitored continuously by radiology nursing throughout the procedure under my direct supervision. CONTRAST:  25 mL Omnipaque 300 MEDICATIONS: No additional medications. The patient is on scheduled IV Zosyn and did not receive additional antibiotics. FLUOROSCOPY TIME:  2 minutes and 42 seconds.  1.0 mGy. PROCEDURE: The procedure, risks, benefits, and alternatives were explained to the patient. Questions regarding the procedure were encouraged and answered. The patient understands and consents to the procedure. A time-out was performed prior to the procedure. Both flank regions were prepped with chlorhexidine in a sterile fashion, and a sterile drape was applied covering the operative field. A sterile gown and sterile gloves were used for the procedure. Local anesthesia was provided with 1% Lidocaine. Ultrasound was used to localize both kidneys. Under direct ultrasound guidance, 21 gauge needles were advanced into the renal collecting systems. Ultrasound image documentation was performed. Aspiration of urine sample was  performed followed by contrast injection. Transitional dilators were advanced over guidewires. Percutaneous tract dilatation was then performed over the guidewire. Ten French bilateral percutaneous nephrostomy tubes were then advanced and formed in the collecting systems. Catheter position was confirmed by fluoroscopy after contrast injection. Both catheters were attached to gravity bag drainage and secured at skin exit sites with Prolene retention sutures and StatLock devices. COMPLICATIONS: None. FINDINGS: After left nephrostomy tube placement, urine return was initially bloody/blood tinged. The tube was formed in the renal pelvis. 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 The nephrostomy tube was partially formed in the upper pole with extension into the region of the renal pelvis. Urine return is clear and slightly turbid. IMPRESSION: Bilateral nephrostomy tube placement. Ten French bilateral nephrostomy tubes were placed and attached to gravity bag drainage. Electronically Signed   By: Irish Lack M.D.   On: 10/18/2023 17:34   DG CHEST PORT 1 VIEW Result Date: 10/18/2023 CLINICAL DATA:  [id number] with pleural effusion and chest tube in place. EXAM: PORTABLE CHEST 1 VIEW COMPARISON:  Portable chest yesterday at 5:17 a.m. FINDINGS: 4:58 a.m. pigtail right chest tube remains with the pigtail superimposing in the lateral lower chest alongside the elevated hemidiaphragm.  There is no measurable pneumothorax. Small right pleural effusion has reaccumulated with adjacent hazy atelectasis or consolidation. The left lung and right upper lung fields are clear. The cardiomediastinal silhouette is stable. There is aortic atherosclerosis. No vascular congestion is seen. No new osseous findings. Thoracic spondylosis and DDD. IMPRESSION: 1. Small right pleural effusion has reaccumulated with adjacent hazy atelectasis or consolidation. 2. Right chest tube  remains in place. No measurable pneumothorax. 3. Aortic atherosclerosis. Electronically Signed   By: Almira Bar M.D.   On: 10/18/2023 06:57   DG CHEST PORT 1 VIEW Result Date: 10/17/2023 CLINICAL DATA:  Status post right chest tube placement for loculated right pleural effusion. EXAM: PORTABLE CHEST 1 VIEW COMPARISON:  10/16/2023 FINDINGS: The heart size and mediastinal contours are within normal limits. Stable positioning right pleural drainage catheter. No significant residual pleural fluid. Stable mild atelectasis at the right lung base with elevation of the right hemidiaphragm. No pneumothorax or pulmonary edema. The visualized skeletal structures are unremarkable. IMPRESSION: Stable positioning of right pleural drainage catheter. No significant residual pleural fluid. Stable mild atelectasis at the right lung base with elevation of the right hemidiaphragm. Electronically Signed   By: Irish Lack M.D.   On: 10/17/2023 09:28   US RENAL Result Date: 10/17/2023 CLINICAL DATA:  [id number]. Renal failure. Prior cystectomy with right lower quadrant urostomy. Surgery date 05/22/2023. EXAM: RENAL / URINARY TRACT ULTRASOUND COMPLETE COMPARISON:  CT without contrast 10/15/2023, renal ultrasound 07/24/2023 FINDINGS: Right Kidney: Renal measurements: Atrophic measuring 5.2 x 5.0 x 4.1 cm = volume: 55.9 mL, previously 69.6 mL. The cortex is thin and echogenic as before. There is moderate hydronephrosis which was seen on the CT yesterday but was not seen on the ultrasound from 07/24/2023. 2 cm anechoic cyst in the upper pole is again shown. No mass is evident and no visible stones. Left Kidney: Renal measurements: 10.0 x 7.0 x 7.4 cm = volume: 275.8 mL. The renal cortex again is mildly echogenic but no more than previously. Severe hydronephrosis is again noted and was present on both prior studies. There is hypoechoic debris within some of the dilated calices which could be infectious or hemorrhagic debris. Correlate  with urinalysis. There are multiple anechoic cysts, largest is 6.7 cm, as before. No complex cyst is seen. No visible stones. Bladder: Surgically absent. Other: None. IMPRESSION: 1. Moderate right hydronephrosis which was seen on the CT yesterday but was not seen on the ultrasound from 07/24/2023. 2. Severe left hydronephrosis which was present on both prior studies. There is hypoechoic debris within some of the dilated calices which could be infectious or hemorrhagic debris. Correlate with urinalysis. 3. Atrophic right kidney with thin and echogenic cortex as before. Mild but unchanged increased echogenicity of the left kidney. 4. Bilateral renal cysts. 5. Prior cystectomy with right lower quadrant urostomy. Electronically Signed   By: Almira Bar M.D.   On: 10/17/2023 02:14   DG Chest Port 1 View Result Date: 10/16/2023 CLINICAL DATA:  Pleural effusion. EXAM: PORTABLE CHEST 1 VIEW COMPARISON:  October 15, 2023. FINDINGS: Right-sided chest tube is again noted.  No pneumothorax is noted. IMPRESSION: No definite pneumothorax seen currently. Right-sided chest tube is again noted. Electronically Signed   By: Lupita Raider M.D.   On: 10/16/2023 09:18   DG Chest Port 1 View Result Date: 10/15/2023 CLINICAL DATA:  Thoracostomy placement EXAM: PORTABLE CHEST 1 VIEW COMPARISON:  10/14/2023 FINDINGS: Chest tube in place on the right in the inferior pleural space. Moderate right  pneumothorax, estimated at 30%. No tension. Volume loss persists at the left lung base. IMPRESSION: Chest tube in place on the right. Moderate right pneumothorax, estimated at 30%. No tension. Electronically Signed   By: Paulina Fusi M.D.   On: 10/15/2023 14:51   CT CHEST ABDOMEN PELVIS WO CONTRAST Result Date: 10/15/2023 CLINICAL DATA:  Set CIS, elevated white count. EXAM: CT CHEST, ABDOMEN AND PELVIS WITHOUT CONTRAST TECHNIQUE: Multidetector CT imaging of the chest, abdomen and pelvis was performed following the standard protocol without  IV contrast. RADIATION DOSE REDUCTION: This exam was performed according to the departmental dose-optimization program which includes automated exposure control, adjustment of the mA and/or kV according to patient size and/or use of iterative reconstruction technique. COMPARISON:  CT abdomen and pelvis 09/30/2023.  Chest CT 07/19/2023 FINDINGS: CT CHEST FINDINGS Cardiovascular: Heart is normal size. Aorta is normal caliber. Diffuse 3 vessel coronary artery disease. Scattered aortic atherosclerosis. Mediastinum/Nodes: No mediastinal, hilar, or axillary adenopathy. Trachea and esophagus are unremarkable. Thyroid unremarkable. Lungs/Pleura: Loculated moderate right pleural effusion, increasing since prior chest CT. Airspace opacity in the right lower lobe and right middle lobe could reflect atelectasis or pneumonia. Predominantly linear opacities in the lingula and left lower lobe could reflect scarring or atelectasis. Subpleural nodularity noted along the left fissure and posteriorly. Left upper lobe nodule on image 37 measures 8 mm, stable since prior study. Left upper lobe nodule on image 36 measures 4 mm, new since prior study. Musculoskeletal: Chest wall soft tissues are unremarkable. No acute bony abnormality. CT ABDOMEN PELVIS FINDINGS Hepatobiliary: 1.9 cm low-density lesion in the right hepatic lobe. 1.8 cm previously, difficult to characterize on this noncontrast study. Gallbladder unremarkable. No biliary ductal dilatation. Pancreas: No focal abnormality or ductal dilatation. Spleen: No focal abnormality.  Normal size. Adrenals/Urinary Tract: Adrenal glands unremarkable. Persistent bilateral hydronephrosis, unchanged since prior study. Nonobstructing stones in the lower pole of the right kidney. Right kidney is atrophic. Multiple bilateral renal cysts are unchanged. Prior cystectomy with right lower quadrant urostomy. Ureters are difficult to follow without contrast. Stomach/Bowel: Moderate to large stool  burden throughout the colon, similar to prior study. No bowel obstruction. Vascular/Lymphatic: Aortoiliac atherosclerosis. No evidence of aneurysm or adenopathy. 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 Other: No free fluid or free air. Musculoskeletal: Again noted is the lytic area within the left sacral ala and adjacent left iliac bone, unchanged since recent study. Degenerative disc and facet disease in the lumbar spine. IMPRESSION: Enlarging chronic loculated right pleural effusion and increasing right lower lobe airspace disease. This could reflect atelectasis or pneumonia. Nodularity noted along the left fissure and subpleural locations, increasing since prior study. Cannot exclude metastases. New 4 mm left upper lobe pulmonary nodule. Recommend attention on follow-up imaging. Diffuse coronary artery disease.  Aortic atherosclerosis. 1.9 cm low-density lesion in the right hepatic lobe, difficult to characterize on this noncontrast study. Prior cystectomy with right lower quadrant urostomy. Stable chronic bilateral hydronephrosis. Moderate to large stool burden throughout the colon. Stable lytic areas within the left sacrum and adjacent left iliac bone. Electronically Signed   By: Charlett Nose M.D.   On: 10/15/2023 01:40   DG Chest Port 1 View Result Date: 10/14/2023 CLINICAL DATA:  Weakness EXAM: PORTABLE CHEST 1 VIEW COMPARISON:  09/30/2023 FINDINGS: Cardiac shadow is within normal limits. Increased density is noted over the right hemithorax which is new from the prior exam. The appearance suggests a loculated effusion. CT of the chest may be helpful for further  evaluation. Left lung is clear. No acute bony abnormality is noted IMPRESSION: Increased density over the right hemithorax suggesting a loculated effusion. CT of the chest may be helpful for further evaluation. Electronically Signed   By: Alcide Clever M.D.   On: 10/14/2023 22:52   CT ABDOMEN PELVIS WO CONTRAST Result Date:  09/30/2023 CLINICAL DATA:  History of bladder cancer status post cystectomy with fever. * Tracking Code: BO * EXAM: CT ABDOMEN AND PELVIS WITHOUT CONTRAST TECHNIQUE: Multidetector CT imaging of the abdomen and pelvis was performed following the standard protocol without IV contrast. RADIATION DOSE REDUCTION: This exam was performed according to the departmental dose-optimization program which includes automated exposure control, adjustment of the mA and/or kV according to patient size and/or use of iterative reconstruction technique. COMPARISON:  CT abdomen and pelvis dated 09/17/2023 and multiple priors FINDINGS: Lower chest: Chronic right pleural effusion with adjacent medial right lower lobe rounded density, likely round atelectasis. Additional subsegmental atelectasis in the middle lobe and lingula. Irregular basilar right lower lobe nodule measuring 1.3 x 0.7 cm (5:23), increased in size from 5 x 4 mm on 07/22/2023. Unchanged right lower lobe perifissural nodules. Partially imaged heart size is normal. Coronary artery calcifications. Hepatobiliary: Ill-defined 1.8 cm hypodensity in segment 4/8 (2:13), incompletely characterized, but appears new compared to more remote studies. No intra or extrahepatic biliary ductal dilation. Gallbladder is contracted. Pancreas: No focal lesions or main ductal dilation. Spleen: Normal in size without focal abnormality. Adrenals/Urinary Tract: No adrenal nodules. Persistent bilateral hydroureteronephrosis. Asymmetric right renal atrophy. Multifocal bilateral cysts are again seen including peripherally calcified right lower pole cysts. No renal calculi. The ureters are not well seen in the absence of contrast. Prior cystectomy. Right lower quadrant urostomy. Stomach/Bowel: Normal appearance of the stomach. No evidence of bowel wall thickening, distention, or inflammatory changes. Moderate volume stool throughout the colon. Appendix is not discretely seen. Vascular/Lymphatic:  Aortic atherosclerosis. No enlarged abdominal or pelvic lymph nodes. Reproductive: Prostate brachytherapy seeds. Other: No free fluid, fluid collection, or free air. Musculoskeletal: Similar to slightly increased lytic changes of the left sacral ala. New lytic changes involving the left iliac bone adjacent to the sacroiliac joint. Degenerative changes of the hips, left-greater-than-right. Multilevel degenerative changes of the partially imaged thoracic and lumbar spine. IMPRESSION: 1. Ill-defined 1.8 cm hypodensity in segment 4/8 of the liver, incompletely characterized, but appears new compared to more remote studies. Findings are suspicious for metastatic disease. Recommend further evaluation with contrast-enhanced MRI of the abdomen. Alternatively, attention on follow-up. 2. Similar to slightly increased lytic changes of the left sacral ala. New lytic changes involving the left iliac bone adjacent to the sacroiliac joint. 3. Irregular basilar right lower lobe nodule measuring 1.3 x 0.7 cm, increased in size from 5 x 4 mm on 07/22/2023, suspicious for metastatic disease. 4. Postsurgical change from cystectomy with right lower quadrant ostomy. Persistent bilateral hydroureteronephrosis. Ureters are not well evaluated in the absence of contrast. 5. Aortic Atherosclerosis (ICD10-I70.0). Coronary artery calcifications. Assessment for potential risk factor modification, dietary therapy or pharmacologic therapy may be warranted, if clinically indicated. Electronically Signed   By: Agustin Cree M.D.   On: 09/30/2023 20:28   DG Chest Port 1 View Result Date: 09/30/2023 CLINICAL DATA:  Fever.  Bladder cancer removed in August. EXAM: PORTABLE CHEST 1 VIEW COMPARISON:  09/02/2023 FINDINGS: The heart size and mediastinal contours are within normal limits. Both lungs are clear. The visualized skeletal structures are unremarkable. IMPRESSION: No active disease. Electronically Signed  By: Burman Nieves M.D.   On: 09/30/2023  20:11

## 2023-10-25 NOTE — Progress Notes (Signed)
PT Cancellation Note  Patient Details Name: Wesley [name] MRN: [id number] DOB: 18-Jun-1951   Cancelled Treatment:    Reason Eval/Treat Not Completed: Fatigue/lethargy limiting ability to participate. Pt fatigued from sitting up in recliner and recently transferred back to bed with nursing. Will continue to check back as schedule allows.    Domenick Bookbinder PT, DPT 10/25/23, 1:52 PM

## 2023-10-25 NOTE — TOC Transition Note (Signed)
Transition of Care [facility] - Discharge Note   Patient Details  Name: Wesley [name] MRN: [id number] Date of Birth: July 16, 1951  Transition of [facility]) CM/SW Contact:  Lanier Clam, RN Phone Number: 10/25/2023, 3:10 PM   Clinical Narrative: current d/c plan home w/hospice-[facility] already following-they will manage dme-per [place] dme will be delivered to home but not necessary prior to d/c. PTAR forms in printer. Nsg to call PTAR when ready.      Final next level of care: Home w Hospice Care Barriers to Discharge: No Barriers Identified   Patient Goals and CMS Choice Patient states their goals for this hospitalization and ongoing recovery are:: Home CMS Medicare.gov Compare Post Acute Care list provided to:: Patient        Discharge Placement                       Discharge Plan and Services Additional resources added to the After Visit Summary for                                       Social Drivers of Health (SDOH) Interventions SDOH Screenings   Food Insecurity: No Food Insecurity (10/15/2023)  Housing: Low Risk  (10/15/2023)  Transportation Needs: No Transportation Needs (10/15/2023)  Utilities: Not At Risk (10/15/2023)  Alcohol Screen: Low Risk  (02/26/2023)  Depression (PHQ2-9): Low Risk  (02/26/2023)  Financial Resource Strain: Low Risk  (09/26/2023)   Received from [facility]  Physical Activity: Insufficiently Active (09/26/2023)   Received from [facility]  Social Connections: Socially Integrated (10/15/2023)  Stress: No Stress Concern Present (09/26/2023)   Received from [facility]  Tobacco Use: Medium Risk (10/15/2023)     Readmission Risk Interventions    10/16/2023    3:44 PM 05/23/2023   10:18 AM  Readmission Risk Prevention Plan  Post Dischage Appt  Complete  Medication Screening  Complete  Transportation Screening Complete Complete  PCP or Specialist Appt within 3-5 Days Complete   HRI or Home Care Consult Complete   Social  Work Consult for Recovery Care Planning/Counseling Complete   Palliative Care Screening Not Applicable   Medication Review Oceanographer) Complete

## 2023-10-25 NOTE — Progress Notes (Signed)
Nutrition Follow-up  DOCUMENTATION CODES:   Severe malnutrition in context of chronic illness, Underweight  INTERVENTION:   -Ensure Plus High Protein po BID, each supplement provides 350 kcal and 20 grams of protein.    -Multivitamin with minerals daily   -Magic cup BID with meals, each supplement provides 290 kcal and 9 grams of protein   -Monitor GOC  NUTRITION DIAGNOSIS:   Severe Malnutrition related to chronic illness, cancer and cancer related treatments as evidenced by moderate fat depletion, severe muscle depletion, percent weight loss.  Ongoing.  GOAL:   Patient will meet greater than or equal to 90% of their needs  Not meeting.  MONITOR:   Supplement acceptance, PO intake, Weight trends, Labs, I & O's  ASSESSMENT:   73 y.o. 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  1/6: admitted 1/7: s/p chest tube insertion 1/10: bilateral PCN placement  1/12: chest tube removed  Per oncology note today, recommending hospice but pt refusing GOC discussions. Will continue to monitor. Pt currently consuming 50-75% of meals. Somewhat confused today.  Still accepting Ensure supplements and vitamins.   Admission weight: 109 lbs Last weight 1/14: 114 lbs   Medications: Multivitamin with minerals daily  Labs reviewed.  Diet Order:   Diet Order             Diet regular Room service appropriate? Yes; Fluid consistency: Thin  Diet effective now                   EDUCATION NEEDS:   No education needs have been identified at this time  Skin:  Skin  Assessment: Skin Integrity Issues: Skin Integrity Issues:: Stage I Stage I: buttocks  Last BM:  1/17 -type 6  Height:   Ht Readings from Last 1 Encounters:  10/15/23 5\' 4"  (1.626 m)    Weight:   Wt Readings from Last 1 Encounters:  10/22/23 51.9 kg    BMI:  Body mass index is 19.64 kg/m.  Estimated Nutritional Needs:   Kcal:  1700-1900  Protein:  85-100g  Fluid:  1.9L/day   Tilda Franco, MS, RD, LDN Inpatient Clinical Dietitian Contact via Secure chat

## 2023-10-25 NOTE — Progress Notes (Signed)
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  He presented to the hospital for fever.  In the ED he was found to have a WBC count of 26 with a creatinine of 2.6.  He was found to have a right lower lobe infiltrate.  Blood and urine cultures were obtained and he was started on IV antibiotics.  He was also found to be incidentally COVID-positive. 1/7 right-sided chest tube insertion which resulted in a 15% pneumothorax Antibiotics for possible underlying pneumonia continued 1/9 pleural fibrinolysis 1/10 urology consulted for rising creatinine and new moderate right-sided hydronephrosis along with a pre-existing left-sided hydronephrosis and bilateral percutaneous nephrostomy tubes placed Oncology consulted-goals of care addressed-ultimate decision was that he is currently not a candidate for palliative chemotherapy due to his poor functional status 1/12-chest tube removed  Subjective:  Eating more solid food now.  Drinking quite well.  Independently going to the bathroom intact.  He understands he has metastatic cancer, would like to remain a full code and is agreeable to going home with hospice services.  He is specifically hesitant to go to [facility] right now.   Principal Problem:   Severe sepsis (HCC) - right-sided pneumonia but possibly also UTI as urine culture grew out Klebsiella and Enterococcus gallinarum on 1/6 -Pleural fluid culture was negative -He has completed 7 days of antibiotics   Active Problems: Pleural effusion- ?  Malignant -Cytology revealed atypical cells-there were squamous cells present but not enough  to stain -Chest tube removed as mentioned above     Urothelial carcinoma of bladder - extensively metastatic -Extensive metastatic disease noted on imaging  -Currently has bilateral nephrostomies in addition to ileal conduit -All are draining well -Mild hematuria was noted in right nephrostomy and ileal conduit-this has resolved -Oncology feels that he is not a candidate for chemotherapy due to his frailty  Hypercalcemia - possibly related to bone mets   AKI - Baseline creatinine about 1.6 -Creatinine rose to 2.9 on  1/8 and has been steadily improving since   Underweight-cachexia with severe fatigue Body mass index is 19.64 kg/m.   Incidentally COVID-positive on 1/6  Disposition: Having daily palliative discussions-- I spoke with the patient and his wife. It was initially planned for him to go to rehab. After further discussions regarding poor prognosis, the patient is considering home with  hospice but his wife is uncertain if she will be able to manage him. Currently he very weak but is is ambulatory (independently) at least to the bathroom.  He is beginning to eat more solids.  He is declining to be a DNR and to transition to [facility].  I explained to both him and his wife that we can arrange hospice at home and they can ultimately be transition to [facility] from home if he continues to decline.    Code Status: Full Code Total time on patient care: 30 min DVT prophylaxis:  heparin injection 5,000 Units Start: 10/15/23 1400 SCDs Start: 10/15/23 0857     Objective:   Vitals:   10/25/23 0425 10/25/23 0500 10/25/23 0817 10/25/23 0819  BP: (!) 177/90 Marland Kitchen)  149/91    Pulse: 97     Resp: 18     Temp: 98.1 F (36.7 C)     TempSrc: Oral     SpO2: 96%  98% 98%  Weight:      Height:       Filed Weights   10/14/23 2233 10/15/23 2200 10/22/23 2228  Weight: 49.7 kg 47.7 kg 51.9 kg   Exam: General exam: Appears comfortable  HEENT: oral mucosa moist Respiratory system:  Clear to auscultation.  Cardiovascular system: S1 & S2 heard  Gastrointestinal system: Abdomen soft, non-tender, nondistended. Normal bowel sounds   Extremities: No cyanosis, clubbing or edema Psychiatry:  Mood & affect appropriate.  GU: Nephrostomy tubes present and hematuria has resolved in both R nephrostomy and ileal conduit    CBC: Recent Labs  Lab 10/19/23 0759 10/20/23 0537 10/21/23 0544 10/23/23 0520 10/24/23 0513  WBC 12.6* 11.0* 10.9* 9.8 9.7  NEUTROABS 11.5* 9.3* 9.4* 8.2*  --   HGB 10.0* 8.7* 9.0* 10.0* 9.7*  HCT 31.7* 26.4* 27.6* 31.5* 31.2*  MCV 95.5 94.3 94.2 95.2 95.7  PLT 348 318 319 475* 409*   Basic Metabolic Panel: Recent Labs  Lab 10/19/23 0759 10/20/23 0537 10/21/23 0544 10/23/23 0520 10/24/23 0513  NA 139 141 140 141 139  K 4.5 3.5 3.5 3.2* 3.8  CL 111 109 112* 112* 109  CO2 15* 20* 19* 18* 19*  GLUCOSE 114* 109* 99 117* 116*  BUN 63* 57* 47* 50* 51*  CREATININE 2.44* 2.29* 2.15* 1.85* 1.53*  CALCIUM 10.3 10.1 10.2 10.8* 10.8*  MG 2.5* 2.3 2.3 2.4  --   PHOS  --   --  3.6  --   --      Scheduled Meds:  amLODipine  5 mg Oral QHS   doxepin  25 mg Oral QHS   feeding supplement  237 mL Oral BID BM   fluticasone furoate-vilanterol  1 puff Inhalation Daily   folic acid  1 mg Oral Daily   heparin injection (subcutaneous)  5,000 Units Subcutaneous Q8H   levETIRAcetam  500 mg Oral Q8H   linaclotide  145 mcg Oral Daily   multivitamin with minerals  1 tablet Oral Daily   OXcarbazepine ER  600 mg Oral Q1500   primidone  100 mg Oral QHS   sodium chloride flush  5 mL Intracatheter Q8H    Imaging and lab data personally reviewed   Author: Calvert Cantor  10/25/2023 2:06 PM  To contact [facility]>   Check the care team in [facility] and look for the attending/consulting [facility] provider listed  Log into [email or web address] and use [place]'s universal password   Go to> "[facility]"  and find provider  If you still have difficulty reaching the  provider, please page the [facility] (Director on Call) for the Hospitalists listed on amion

## 2023-10-26 LAB — PTH, INTACT AND CALCIUM: Calcium, Total (PTH): 11 mg/dL — ABNORMAL HIGH (ref 8.6–10.2)

## 2023-10-26 MED ORDER — OXYCODONE HCL 5 MG PO TABS: 5.0000 mg | ORAL_TABLET | ORAL | 0 refills | Status: DC | PRN

## 2023-10-26 NOTE — Plan of Care (Signed)
  Problem: Clinical Measurements: Goal: Respiratory complications will improve Outcome: Progressing   Problem: Activity: Goal: Risk for activity intolerance will decrease Outcome: Progressing   Problem: Nutrition: Goal: Adequate nutrition will be maintained Outcome: Progressing   Problem: Coping: Goal: Level of anxiety will decrease Outcome: Progressing   Problem: Pain Management: Goal: General experience of comfort will improve Outcome: Progressing

## 2023-10-26 NOTE — TOC Progression Note (Signed)
Transition of Care [facility]) - Progression Note    Patient Details  Name: Wesley [name] MRN: [id number] Date of Birth: [birth year]  Transition of Care [facility]) CM/SW Contact  Adrian Prows, RN Phone Number: 10/26/2023, 10:45 AM  Clinical Narrative:    Pt's wife [name] notified transport has been called; PTAR called at 24;  spoke w/ operator # [phone]; no TOC needs.    Expected Discharge Plan: Home w Hospice Care Barriers to Discharge: No Barriers Identified  Expected Discharge Plan and Services         Expected Discharge Date: 10/26/23                                     Social Determinants of Health (SDOH) Interventions SDOH Screenings   Food Insecurity: No Food Insecurity (10/15/2023)  Housing: Low Risk  (10/15/2023)  Transportation Needs: No Transportation Needs (10/15/2023)  Utilities: Not At Risk (10/15/2023)  Alcohol Screen: Low Risk  (02/26/2023)  Depression (PHQ2-9): Low Risk  (02/26/2023)  Financial Resource Strain: Low Risk  (09/26/2023)   Received from [facility]  Physical Activity: Insufficiently Active (09/26/2023)   Received from [facility]  Social Connections: Socially Integrated (10/15/2023)  Stress: No Stress Concern Present (09/26/2023)   Received from [facility]  Tobacco Use: Medium Risk (10/15/2023)    Readmission Risk Interventions    10/16/2023    3:44 PM 05/23/2023   10:18 AM  Readmission Risk Prevention Plan  Post Dischage Appt  Complete  Medication Screening  Complete  Transportation Screening Complete Complete  PCP or Specialist Appt within 3-5 Days Complete   HRI or Home Care Consult Complete   Social Work Consult for Recovery Care Planning/Counseling Complete   Palliative Care Screening Not Applicable   Medication Review Oceanographer) Complete

## 2023-11-06 ENCOUNTER — Telehealth: Payer: Self-pay | Admitting: Nurse Practitioner

## 2023-11-06 NOTE — Telephone Encounter (Signed)
Left a voicemail with patient and patient contacts regarding scheduled appointment times/dates for Palliative care

## 2023-12-07 DEATH — deceased

## 2023-12-13 ENCOUNTER — Ambulatory Visit: Payer: Medicare Other | Admitting: Pulmonary Disease

## 2023-12-27 ENCOUNTER — Other Ambulatory Visit (HOSPITAL_COMMUNITY): Payer: Medicare Other
